# Patient Record
Sex: Female | Born: 1935
Health system: Southern US, Community
[De-identification: ages and names within clinical notes are randomized; demographics above are authoritative.]

## PROBLEM LIST (undated history)

## (undated) DIAGNOSIS — J310 Chronic rhinitis: Secondary | ICD-10-CM

## (undated) DIAGNOSIS — Z96659 Presence of unspecified artificial knee joint: Secondary | ICD-10-CM

## (undated) DIAGNOSIS — R519 Headache, unspecified: Secondary | ICD-10-CM

## (undated) DIAGNOSIS — J45909 Unspecified asthma, uncomplicated: Secondary | ICD-10-CM

## (undated) DIAGNOSIS — N3281 Overactive bladder: Secondary | ICD-10-CM

## (undated) DIAGNOSIS — I10 Essential (primary) hypertension: Secondary | ICD-10-CM

## (undated) DIAGNOSIS — Z9889 Other specified postprocedural states: Secondary | ICD-10-CM

## (undated) DIAGNOSIS — R112 Nausea with vomiting, unspecified: Secondary | ICD-10-CM

## (undated) DIAGNOSIS — I051 Rheumatic mitral insufficiency: Secondary | ICD-10-CM

## (undated) DIAGNOSIS — H409 Unspecified glaucoma: Secondary | ICD-10-CM

## (undated) DIAGNOSIS — T4145XA Adverse effect of unspecified anesthetic, initial encounter: Secondary | ICD-10-CM

## (undated) DIAGNOSIS — T8859XA Other complications of anesthesia, initial encounter: Secondary | ICD-10-CM

## (undated) DIAGNOSIS — R7303 Prediabetes: Secondary | ICD-10-CM

## (undated) DIAGNOSIS — K219 Gastro-esophageal reflux disease without esophagitis: Secondary | ICD-10-CM

## (undated) DIAGNOSIS — R51 Headache: Secondary | ICD-10-CM

## (undated) DIAGNOSIS — M1990 Unspecified osteoarthritis, unspecified site: Secondary | ICD-10-CM

## (undated) DIAGNOSIS — Z889 Allergy status to unspecified drugs, medicaments and biological substances status: Secondary | ICD-10-CM

## (undated) DIAGNOSIS — R9431 Abnormal electrocardiogram [ECG] [EKG]: Secondary | ICD-10-CM

## (undated) DIAGNOSIS — I739 Peripheral vascular disease, unspecified: Secondary | ICD-10-CM

## (undated) DIAGNOSIS — E785 Hyperlipidemia, unspecified: Secondary | ICD-10-CM

## (undated) DIAGNOSIS — E162 Hypoglycemia, unspecified: Secondary | ICD-10-CM

## (undated) DIAGNOSIS — Z8489 Family history of other specified conditions: Secondary | ICD-10-CM

## (undated) DIAGNOSIS — Z973 Presence of spectacles and contact lenses: Secondary | ICD-10-CM

## (undated) DIAGNOSIS — H9190 Unspecified hearing loss, unspecified ear: Secondary | ICD-10-CM

## (undated) DIAGNOSIS — F419 Anxiety disorder, unspecified: Secondary | ICD-10-CM

## (undated) HISTORY — PX: EYE SURGERY: SHX253

## (undated) HISTORY — PX: JOINT REPLACEMENT: SHX530

## (undated) HISTORY — PX: OTHER SURGICAL HISTORY: SHX169

## (undated) HISTORY — PX: APPENDECTOMY: SHX54

## (undated) HISTORY — DX: Rheumatic mitral insufficiency: I05.1

## (undated) HISTORY — DX: Hyperlipidemia, unspecified: E78.5

## (undated) HISTORY — DX: Overactive bladder: N32.81

## (undated) HISTORY — PX: DIAGNOSTIC LAPAROSCOPY: SUR761

## (undated) HISTORY — PX: BREAST SURGERY: SHX581

## (undated) HISTORY — PX: VEIN LIGATION: SHX2652

## (undated) HISTORY — PX: OVARIAN CYST SURGERY: SHX726

## (undated) HISTORY — PX: ABDOMINAL HYSTERECTOMY: SHX81

## (undated) HISTORY — PX: KNEE ARTHROSCOPY: SUR90

## (undated) HISTORY — PX: DILATION AND CURETTAGE OF UTERUS: SHX78

## (undated) HISTORY — PX: URETER SURGERY: SHX823

## (undated) HISTORY — PX: TUBAL LIGATION: SHX77

---

## 2004-09-26 ENCOUNTER — Ambulatory Visit: Payer: Self-pay | Admitting: Internal Medicine

## 2004-10-10 ENCOUNTER — Ambulatory Visit: Payer: Self-pay | Admitting: Internal Medicine

## 2004-10-19 ENCOUNTER — Ambulatory Visit: Payer: Self-pay | Admitting: Internal Medicine

## 2004-11-22 ENCOUNTER — Ambulatory Visit: Payer: Self-pay | Admitting: Internal Medicine

## 2005-01-24 ENCOUNTER — Ambulatory Visit: Payer: Self-pay | Admitting: Critical Care Medicine

## 2005-02-07 ENCOUNTER — Ambulatory Visit: Payer: Self-pay | Admitting: Internal Medicine

## 2005-04-15 ENCOUNTER — Ambulatory Visit: Payer: Self-pay | Admitting: Internal Medicine

## 2005-04-19 ENCOUNTER — Ambulatory Visit: Payer: Self-pay | Admitting: Cardiology

## 2005-05-01 ENCOUNTER — Ambulatory Visit: Payer: Self-pay | Admitting: Emergency Medicine

## 2005-06-04 ENCOUNTER — Ambulatory Visit: Payer: Self-pay | Admitting: Internal Medicine

## 2005-07-19 ENCOUNTER — Ambulatory Visit: Payer: Self-pay | Admitting: Internal Medicine

## 2005-10-01 ENCOUNTER — Ambulatory Visit: Payer: Self-pay | Admitting: Internal Medicine

## 2006-01-03 ENCOUNTER — Ambulatory Visit: Payer: Self-pay | Admitting: Internal Medicine

## 2006-06-18 ENCOUNTER — Ambulatory Visit: Payer: Self-pay | Admitting: Internal Medicine

## 2006-06-25 ENCOUNTER — Ambulatory Visit: Payer: Self-pay | Admitting: Internal Medicine

## 2006-07-22 HISTORY — PX: COLECTOMY: SHX59

## 2006-07-24 ENCOUNTER — Ambulatory Visit: Payer: Self-pay | Admitting: Internal Medicine

## 2006-10-23 ENCOUNTER — Ambulatory Visit: Payer: Self-pay | Admitting: Internal Medicine

## 2007-03-30 ENCOUNTER — Ambulatory Visit: Payer: Self-pay | Admitting: Internal Medicine

## 2007-07-23 HISTORY — PX: FUNCTIONAL ENDOSCOPIC SINUS SURGERY: SUR616

## 2007-08-12 ENCOUNTER — Encounter (INDEPENDENT_AMBULATORY_CARE_PROVIDER_SITE_OTHER): Payer: Self-pay | Admitting: Otolaryngology

## 2007-08-12 ENCOUNTER — Ambulatory Visit (HOSPITAL_COMMUNITY): Admission: RE | Admit: 2007-08-12 | Discharge: 2007-08-13 | Payer: Self-pay | Admitting: Otolaryngology

## 2007-11-16 ENCOUNTER — Encounter: Admission: RE | Admit: 2007-11-16 | Discharge: 2007-11-16 | Payer: Self-pay | Admitting: Otolaryngology

## 2008-08-17 ENCOUNTER — Telehealth (INDEPENDENT_AMBULATORY_CARE_PROVIDER_SITE_OTHER): Payer: Self-pay | Admitting: *Deleted

## 2010-06-18 ENCOUNTER — Encounter
Admission: RE | Admit: 2010-06-18 | Discharge: 2010-07-19 | Payer: Self-pay | Source: Home / Self Care | Attending: Orthopedic Surgery | Admitting: Orthopedic Surgery

## 2010-07-19 ENCOUNTER — Encounter
Admission: RE | Admit: 2010-07-19 | Discharge: 2010-08-21 | Payer: Self-pay | Source: Home / Self Care | Attending: Orthopedic Surgery | Admitting: Orthopedic Surgery

## 2010-08-23 ENCOUNTER — Ambulatory Visit: Payer: Commercial Managed Care - PPO | Admitting: Physical Therapy

## 2010-08-29 ENCOUNTER — Ambulatory Visit: Payer: Medicare Other | Attending: Orthopedic Surgery | Admitting: Physical Therapy

## 2010-08-29 DIAGNOSIS — M546 Pain in thoracic spine: Secondary | ICD-10-CM | POA: Insufficient documentation

## 2010-08-29 DIAGNOSIS — M25519 Pain in unspecified shoulder: Secondary | ICD-10-CM | POA: Insufficient documentation

## 2010-08-29 DIAGNOSIS — M2569 Stiffness of other specified joint, not elsewhere classified: Secondary | ICD-10-CM | POA: Insufficient documentation

## 2010-08-29 DIAGNOSIS — IMO0001 Reserved for inherently not codable concepts without codable children: Secondary | ICD-10-CM | POA: Insufficient documentation

## 2010-09-05 ENCOUNTER — Ambulatory Visit: Payer: Medicare Other | Admitting: Physical Therapy

## 2010-09-13 ENCOUNTER — Ambulatory Visit: Payer: Medicare Other | Admitting: Physical Therapy

## 2010-09-20 ENCOUNTER — Institutional Professional Consult (permissible substitution): Payer: Self-pay | Admitting: Internal Medicine

## 2010-11-12 ENCOUNTER — Ambulatory Visit: Payer: Medicare Other | Attending: Orthopedic Surgery | Admitting: Physical Therapy

## 2010-11-12 DIAGNOSIS — M25519 Pain in unspecified shoulder: Secondary | ICD-10-CM | POA: Insufficient documentation

## 2010-11-12 DIAGNOSIS — M546 Pain in thoracic spine: Secondary | ICD-10-CM | POA: Insufficient documentation

## 2010-11-12 DIAGNOSIS — IMO0001 Reserved for inherently not codable concepts without codable children: Secondary | ICD-10-CM | POA: Insufficient documentation

## 2010-11-12 DIAGNOSIS — M2569 Stiffness of other specified joint, not elsewhere classified: Secondary | ICD-10-CM | POA: Insufficient documentation

## 2010-11-14 ENCOUNTER — Ambulatory Visit: Payer: Medicare Other | Admitting: Physical Therapy

## 2010-11-19 ENCOUNTER — Ambulatory Visit: Payer: Medicare Other | Admitting: Physical Therapy

## 2010-11-23 ENCOUNTER — Ambulatory Visit: Payer: Medicare Other | Attending: Orthopedic Surgery | Admitting: Physical Therapy

## 2010-11-23 DIAGNOSIS — M25519 Pain in unspecified shoulder: Secondary | ICD-10-CM | POA: Insufficient documentation

## 2010-11-23 DIAGNOSIS — M2569 Stiffness of other specified joint, not elsewhere classified: Secondary | ICD-10-CM | POA: Insufficient documentation

## 2010-11-23 DIAGNOSIS — M546 Pain in thoracic spine: Secondary | ICD-10-CM | POA: Insufficient documentation

## 2010-11-23 DIAGNOSIS — IMO0001 Reserved for inherently not codable concepts without codable children: Secondary | ICD-10-CM | POA: Insufficient documentation

## 2010-11-28 ENCOUNTER — Ambulatory Visit: Payer: Medicare Other | Admitting: Physical Therapy

## 2010-11-30 ENCOUNTER — Ambulatory Visit: Payer: Medicare Other | Admitting: Physical Therapy

## 2010-12-04 NOTE — Op Note (Signed)
NAME:  Cassandra Sims, Cassandra Sims               ACCOUNT NO.:  [id number]   MEDICAL RECORD NO.:  [id number]          PATIENT TYPE:  AMB   LOCATION:  SDS                          FACILITY:  [facility]   PHYSICIAN:  Jefry H. Pollyann Kennedy, MD     DATE OF BIRTH:  1933-01-25   DATE OF PROCEDURE:  08/12/2007  DATE OF DISCHARGE:                               OPERATIVE REPORT   PREOPERATIVE DIAGNOSES:  1. Nasal polyposis.  2. Chronic ethmoid sinusitis.  3. Chronic maxillary sinusitis.  4. Chronic frontal sinusitis.   POSTOPERATIVE DIAGNOSES:  1. Nasal polyposis.  2. Chronic ethmoid sinusitis.  3. Chronic maxillary sinusitis.  4. Chronic frontal sinusitis.   PROCEDURE:  1. Bilateral endoscopic extensive nasal polypectomy.  2. Bilateral endoscopic total ethmoidectomy.  3. Bilateral endoscopic frontal sinusotomy.  4. Bilateral endoscopic maxillary antrostomy with removal of tissue.   SURGEON:  Jefry H. Pollyann Kennedy, MD   ANESTHESIA:  General endotracheal anesthesia was used.   COMPLICATIONS:  No complications.   BLOOD LOSS:  Less than 100 mL.   REFERRING PHYSICIAN:  Clinton D. Young, MD, FCCP, FACP   FINDINGS:  Diffuse polypoid disease arising from the middle meatus  bilaterally, with extensive nasal polyposis and sinus polyposis.  Bilateral maxillary and frontal sinuses were completely filled with very  thick tenacious mucoid material.   HISTORY:  This is a 77 year old lady with a long history of chronic  sinusitis and nasal obstruction.  Risks, benefits, alternatives,  complications of procedure explained to the patient's who seemed to  understand and agreed to surgery.   PROCEDURE:  The patient was taken to the operating room and placed on  the operating table in supine position.  Following induction of general  endotracheal anesthesia the face was prepped and draped in standard  fashion.  Afrin spray was used preoperatively.  1% Xylocaine with  epinephrine was infiltrated into the polypoid material and  the superior  and posterior attachments of the middle turbinates bilaterally.   1. Extensive bilateral nasal polypectomy.  Using a 0 degree nasal      endoscope and a microdebrider an extensive polypectomy was      performed in the nasal cavities bilaterally.  Polyps were followed      all the way back into the infundibular region and into the ethmoid      complex.  2. Bilateral endoscopic total ethmoidectomy.  After the polypectomy      was completed the dissection continued through the bulla and into      the ethmoid complex.  The ground lamella was taken down to expose      the posterior ethmoid cells.  The sinuses were either filled with      polypoid material, thick mucoid material or allergic fungal mucin      type material.  A sample of the allergic mucin was sent in a Lukens      trap for fungal smear and culture.  Complete ethmoid dissection was      performed bilaterally with the lateral limit of dissection being      the lamina papyracea and the  superior limit being the fovea      ethmoidalis.  Both of these structures were intact bilaterally      without dehiscence.  After the dissection was completed the ethmoid      cavities were packed with Kyung Rudd packs and inflated with local      anesthetic solution.  3. Bilateral endoscopic frontal sinusotomy.  After the ethmoid      dissection was completed attention was then carried over to the      frontal recess.  Using a 30 degree scope and curved suction the      frontal recess was entered and polypoid disease was cleaned out      with the forceps and microdebrider.  The sinus themselves were      suctioned out of very thick tenacious mucoid material using a      combination of thick and thin angled suctions.  The openings were      nice and wide following the polypoid dissection.  4. Bilateral endoscopic maxillary antrostomy with removal of tissue.      After the ethmoid bulla was opened the middle meatus was inspected       with a 30 degree scope and curved suction was entered through the      fontanelle into the maxillary sinuses bilaterally.  The anterior      edge of the antrostomy was enlarged using a backbiting forceps and      posteriorly using a through cut forceps.  On both sides there was      very thick mucinous type material contained within the sinuses and      polypoid diseased mucosa both of which were all removed.  At the      termination of the procedure the nasal and sinus cavities as well      as the pharynx were suctioned of all blood and debris and then the      patient was awakened, extubated and transferred to recovery in      stable condition.      Jefry H. Pollyann Kennedy, MD  Electronically Signed     JHR/MEDQ  D:  08/12/2007  T:  08/12/2007  Job:  [id number]   cc:   Joni Fears D. Maple Hudson, MD, FCCP, FACP

## 2010-12-04 NOTE — Assessment & Plan Note (Signed)
[facility]                             PULMONARY OFFICE NOTE   Sims, Cassandra                        MRN:          [id number]  DATE:03/30/2007                            DOB:          09-22-1932    HISTORY:  A 77 year old white female who is last seen in this office in  March 2007 with symptoms of predominant chronic rhinitis.  She has  already received maximum treatment for her chronic rhinitis in the form  of nasal steroids and frequent antibiotics and, therefore, was seen by  Dr. Pollyann Kennedy who recommended surgery, but she is just not sure she should  go that direction, based on what she has heard.   She comes in today with new onset of hemoptysis in the setting of  ongoing nasal congestion with intermittent epistaxis.  She also has  purulent nasal discharge.  She denies any pleuritic pain, orthopnea,  PND, or leg swelling.   Medication reviewed in detail on the face sheet date March 30, 2007.  Note that the patient previously had a medication calendar that  delineated maintenance versus p.r.n. with the p.r.n. specifically listed  as to symptoms.  Today she was very confused about how to use p.r.n. and  made up her own sheet that did not include this information and did not  bring the sheet that she was previously given here which included all  this information in an unambiguous and very user friendly fashion.   PHYSICAL EXAMINATION:  GENERAL:  She is a depressed-appearing, 77-year-  old white female in no acute distress.  VITAL SIGNS:  Stable vital signs.  HEENT:  Moderate turbinate edema with no active bleeding.  Oropharynx is  clear with no evidence of excessive post nasal drainage, bloody,  purulent or otherwise.  CHEST:  She had a few inspiratory and expiatory rhonchi, but overall air  movement was adequate.  HEART:  Regular rhythm without murmur, gallop or rub.  ABDOMEN:  Soft, benign.  EXTREMITIES:  Warm without calf tenderness,  cyanosis or clubbing or  edema.   IMPRESSION:  1. Chronic rhinitis, chronic sinusitis refractory to medical therapy      and, therefore, I strongly favor following Dr. Lucky Rathke plan to      treat her more aggressively surgically.  I spent extra time going      over my reasoning behind this and gave her, as I did in March of      2007, both graphic and text-formatted material on the optimal      management of chronic rhinitis.  To treat her acutely today, I      recommended Levaquin 750 mg for five days.  If she continues to      have hemoptysis in the absence of any epistaxis, then that would      indicate further pulmonary work-up is necessary, but I do not think      that this is going to be the case at all and emphasized to her      that, although epistaxis did not cause hemoptysis, hemoptysis never  causes epistaxis especially unlikely in this never smoker with no      history of bronchiectasis.  However, should the problem continue,      we can certainly see her back here for followup but would feel that      she would be a more appropriately treated at ENT by Dr. Pollyann Kennedy, and      if that includes surgery, then I strongly recommend she proceed      with it.  2. In terms of her p.r.n. medications, I then went over each and every      one of the p.r.n. medications we have her on to explain under what      circumstances she could take which medicine.  Specifically if she      has cough or congestion, she should use Mucinex DM.  If she has      wheezing or shortness of breath, she should use albuterol.  For      stuffy congested nose, with obstructive symptoms, she could use      Advil Cold and sinus.  I also offered the service of Tammy, our      nurse practitioner, to regenerate the medication calendar if she      will bring in all of her medicines with her in two separate bags,      maintenance versus p.r.n.     Cassandra Sims. Cassandra Sires, MD, [facility]  Electronically Signed    MBW/MedQ   DD: 03/30/2007  DT: 03/31/2007  Job #: [id number]   cc:   Doreatha Martin, M.D.  Jefry H. Pollyann Kennedy, MD

## 2010-12-07 NOTE — Assessment & Plan Note (Signed)
[facility]                             PULMONARY OFFICE NOTE   Cassandra Sims, Cassandra Sims                        MRN:          [id number]  DATE:04/24/2007                            DOB:          July 08, 1933    PROBLEMS:  1. Allergic rhinitis.  2. Chronic rhinosinusitis.  3. Asthma.   HISTORY:  One month followup. Complains of nasal congestion, occasional  sneeze, stuffy nose interferes with her sleep. Chest feels fine. Nasal  symptoms vary with the weather. Last time here, we had given her Entex  PSE and 10 days of Biaxin 500 mg b.i.d. She took the Entex just in the  mornings, but still said that it kept her awake at night and over  stimulated. It may have cleared nasal stuffiness temporarily. She has  not had headache or purulent discharge. We reviewed her limited sinus CT  from the [facility] machine on March 30, 2005, which described extensive  chronic paranasal nasal disease with some bony remodeling particularly  of the ethmoid complex/lamina papyracea suggesting chronicity and  questioning of polyps with no air-fluid levels. This had been discussed  with her previously. I suggested ENT evaluation. She had had positive  allergy skin tests when last here and we reviewed options of allergy  management along with a reinforcement of environmental precautions. She  is interested in considering allergy vaccine depending on how she does  with ENT evaluation.   MEDICATIONS:  1. Advair HFA 45/21 two puffs b.i.d.  2. Azopt optic drops b.i.d.  3. Norvasc 5 mg times one-half.  4. Micardis 40 mg.  5. Actonel 35 mg per week.  6. Multivitamins.  7. Tums.  8. Lumigan.  9. Astelin nasal spray, use p.r.n.  10.Albuterol rescue inhaler.  11.Over-the-counter decongestant and mucolytic therapies.   ALLERGIES:  No medication allergy.   OBJECTIVE:  Weight 122 pounds. BP 132/80. Pulse is regular at 70. Room  air saturation 98%.  There is white mucus in the nose.  I do not think I am seeing polyps or  mechanical obstruction.  Tympanic membranes are not bulging. There is no erythema.  Chest is quiet and unlabored.  Heart sounds regular without murmur.  There is no adenopathy.   IMPRESSION:  1. Allergic rhinitis with a chronic rhinosinusitis.  2. Mild controlled asthma.   PLAN:  1. ENT referral to Dr. Allegra Grana on January 29th to evaluate options      for chronic sinusitis and some history of eustachian dysfunction.  2. Schedule return in 3 months, which will be spring weather. Consider      option of allergy vaccine at that time.  3. She is instructed in saline nasal lavage.     Clinton D. Maple Hudson, MD, [facility], FACP  Electronically Signed    CDY/MedQ  DD: 07/24/2006  DT: 07/24/2006  Job #: [id number]   cc:   Doreatha Martin, M.D.  Jefry H. Pollyann Kennedy, MD

## 2011-04-11 LAB — FUNGUS CULTURE W SMEAR: Fungal Smear: NONE SEEN

## 2011-04-11 LAB — CBC
HCT: 40.3
Hemoglobin: 13.6
MCV: 92.1
Platelets: 223
RDW: 13.7

## 2011-04-11 LAB — BASIC METABOLIC PANEL
Calcium: 9.6
Creatinine, Ser: 0.65
GFR calc non Af Amer: >60

## 2011-06-10 DIAGNOSIS — H35371 Puckering of macula, right eye: Secondary | ICD-10-CM | POA: Insufficient documentation

## 2012-02-26 DIAGNOSIS — H2510 Age-related nuclear cataract, unspecified eye: Secondary | ICD-10-CM | POA: Insufficient documentation

## 2012-08-04 ENCOUNTER — Other Ambulatory Visit: Payer: Self-pay | Admitting: Internal Medicine

## 2012-08-04 ENCOUNTER — Ambulatory Visit (INDEPENDENT_AMBULATORY_CARE_PROVIDER_SITE_OTHER): Payer: Medicare Other | Admitting: Internal Medicine

## 2012-08-04 DIAGNOSIS — R0602 Shortness of breath: Secondary | ICD-10-CM

## 2012-08-04 DIAGNOSIS — R0600 Dyspnea, unspecified: Secondary | ICD-10-CM

## 2012-08-04 LAB — PULMONARY FUNCTION TEST

## 2012-08-04 NOTE — Progress Notes (Signed)
PFT done today. 

## 2012-08-18 ENCOUNTER — Other Ambulatory Visit: Payer: Self-pay | Admitting: Internal Medicine

## 2012-10-20 ENCOUNTER — Encounter (HOSPITAL_BASED_OUTPATIENT_CLINIC_OR_DEPARTMENT_OTHER): Payer: Self-pay | Admitting: *Deleted

## 2012-10-20 NOTE — Progress Notes (Signed)
To come in for bmet-ekg Had had this surgery in past

## 2012-10-22 ENCOUNTER — Encounter (HOSPITAL_BASED_OUTPATIENT_CLINIC_OR_DEPARTMENT_OTHER): Payer: Self-pay | Admitting: *Deleted

## 2012-10-22 ENCOUNTER — Encounter (HOSPITAL_BASED_OUTPATIENT_CLINIC_OR_DEPARTMENT_OTHER)
Admission: RE | Admit: 2012-10-22 | Discharge: 2012-10-22 | Disposition: A | Discharge status: Home / Self Care | Payer: Medicare Other | Source: Ambulatory Visit | Attending: Otolaryngology | Admitting: Otolaryngology

## 2012-10-25 NOTE — H&P (Signed)
Assessment   Rhinitis, chronic (472.0) (J31.0). Discussed  She only took 2 days of Levaquin because she had some side effects, and he feels drunk. She feels a little better now but still feels very full and does not have any further discharge from her sinuses when she rinses with saline. Exam, the ears were clear and healthy today. Middle ear looked clear and healthy as well. Nasal exam looks much improved there is no mucosal edema and no exudate seen.   Recommend she try to finish the last 2 days of Levaquin having her take a half at a time to see if she has any residual side effects. She has a hard time taking most of the antibiotics. And she will let me know if this doesn't help. Reason For Visit  Recheck ears. Allergies  BandAids Clindamycin Codeine Derivatives Darvocet-N 100 TABS Dilaudid SOLN; Hypotension Doxycycline Hyclate CAPS Flagyl TABS Lactose Macrodantin CAPS Morphine Derivatives; Hallucinations Nitrofurantoin CAPS Penicillins; Shortness of breath,Rash,Swelling * swelling tongue, 17 Jan 2012 Premarin CREA Sulfa Drugs; Rash Tape Zofran SOLN; Hypotension. Current Meds  Centrum Oral Tablet;TAKE 0.5 TABLET 3 TIMES A WEEK; RPT Micardis 40 MG Oral Tablet;TAKE 1 TABLET ONCE DAILY.; Rx Vitamin D-3 TABS;; RPT Ventolin HFA 108 (90 Base) MCG/ACT Inhalation Aerosol Solution;INHALE 1-2 PUFFS EVERY 4-6 HOURS AS NEEDED AND AS DIRECTED.; Rx Vitamin D TABS;; RPT Systane SOLN;; RPT Mupirocin 2 % External Ointment;APPLY TO NOSE 3 TIMES A DAY FOR 7 DAYS; RPT Advair HFA 115-21 MCG/ACT Inhalation Aerosol;INHALE 2 PUFFS AT 12 HOUR INTERVALS (MORNING AND EVENING).; Rx Lutein TABS;; RPT AmLODIPine Besylate 10 MG Oral Tablet;Take 1 tablet every day; Rx Caltrate 600+D TABS;; RPT ZyrTEC Allergy TABS;; RPT Omega 3 1000 MG Oral Capsule;TAKE  CAPSULE TWICE DAILY; RPT Qnasl 80 MCG/ACT Nasal Aerosol Solution;Use 2 sprays each nostril once daily; Rx. Active Problems  Allergic rhinitis  (477.9)  (J30.9) Asthma  (493.90) (J45.909) Benign essential hypertension  (401.1) (I10) Cataract Both Eyes  Chronic obstructive pulmonary disease  (496) (J44.9) Chronic sinusitis  (473.9) (J32.9) CONDUCT HEARING LOSS NOS  (389.00) Cystocele, lateral  (618.02) (N81.12) Dermatophytosis, nail  (110.1) (B35.1) Diaphragmatic paralysis  (519.4) (J98.6); left side Disorder of bursae and tendons in shoulder region, unspecified laterality  (726.10) (M75.50) Dysuria  (788.1) (R30.0) Esophageal reflux  (530.81) (K21.9) Eustachian tube dysfunction  (381.81) (H69.80) Flat foot  (734) (M21.40) Glaucoma of both eyes  (365.9) (H40.9) Hyperlipemia  (272.4) (E78.5) Intolerance To Milk Products  Localized Primary Osteoarthritis Of The Right Shoulder Acromioclavicular Joint  MIXED HEARING LOSS, UNSP  (389.20) Mixed incontinence  (788.33) (N39.46) Osteoporosis, senile  (733.01) (M81.8) Prediabetes  (790.29) (R73.09) RHINITIS DUE TO POLLEN  (477.0) Rhinitis, chronic  (472.0) (J31.0) Shoulder joint pain, unspecified laterality  (719.41) (M25.519) Varicose veins  (454.9) (I86.8). PMH  Acute sinusitis (461.9) (J01.90) History of acute bronchitis Resolving (V12.69) (Z87.09) Pain in thoracic spine (724.1) (M54.6); Resolved: [id number] Urological Procedures; s/p transected left ureter at time of oophrectomy - repaired initially with repeat repair at later date. PSH  Anterior Colporrhaphy, Repair Of Cystocele 05 Feb 2006; --HPRO po days 90--Diagnosis Stress female--Cystocele, midline Appendectomy; age 45 Biopsy Breast Percutaneous Needle Core; L breast Jan 2006 florid epithelial hyperplasia Cataract Surgery Cesarean Section Cystoscopy (Diagnostic) 05 Feb 2006; --HPRO po days 90--Diagnosis Stress female--Cystocele, midline Enterotomy Baker Tube Placement 09 Sep 2008; LOA, segmental resec of jejunum, tube decompressionassisted by Hal Hope PAHPRIP Exploratory Laparotomy; Excision ovarian tumor Hysterectomy;  due to lots of blood clots, at age 56 Sinus Surgery Tubal Ligation  Vaginal Sling Operation For Stress Incontinence 05 Feb 2006; --HPRO po days 90--Diagnosis Stress female--Cystocele, midline Varicose Vein Ligation; bilateral. Family Hx  Family history of allergies (V19.6) (Z84.89) Family history of hypertension (V17.49) (Z83.49) Family history of lung cancer: Brother (V16.1) (Z80.1); twin brothers both were smokers and died of lung cancers in their 70's Family history of osteoporosis (V17.81) (Z82.62). Personal Hx  Denied Alcohol Use (History) Exercise Habits Former Smoker Marital History - Currently Married Marital History - Single; divorced.  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 Signature  Electronically signed by : Serena Colonel  M.D.; 07/24/2012 2:18 PM EST.

## 2012-10-26 ENCOUNTER — Encounter (HOSPITAL_BASED_OUTPATIENT_CLINIC_OR_DEPARTMENT_OTHER): Payer: Self-pay | Admitting: *Deleted

## 2012-10-26 ENCOUNTER — Ambulatory Visit (HOSPITAL_BASED_OUTPATIENT_CLINIC_OR_DEPARTMENT_OTHER)
Admission: RE | Admit: 2012-10-26 | Discharge: 2012-10-26 | Disposition: A | Discharge status: Home / Self Care | Payer: Medicare Other | Source: Ambulatory Visit | Attending: Otolaryngology | Admitting: Otolaryngology

## 2012-10-26 ENCOUNTER — Encounter (HOSPITAL_BASED_OUTPATIENT_CLINIC_OR_DEPARTMENT_OTHER)
Admission: RE | Disposition: A | Discharge status: Home / Self Care | Payer: Self-pay | Source: Ambulatory Visit | Attending: Otolaryngology

## 2012-10-26 ENCOUNTER — Ambulatory Visit (HOSPITAL_BASED_OUTPATIENT_CLINIC_OR_DEPARTMENT_OTHER): Payer: Medicare Other | Admitting: *Deleted

## 2012-10-26 DIAGNOSIS — J4489 Other specified chronic obstructive pulmonary disease: Secondary | ICD-10-CM | POA: Insufficient documentation

## 2012-10-26 DIAGNOSIS — J329 Chronic sinusitis, unspecified: Secondary | ICD-10-CM | POA: Insufficient documentation

## 2012-10-26 DIAGNOSIS — Z87891 Personal history of nicotine dependence: Secondary | ICD-10-CM | POA: Insufficient documentation

## 2012-10-26 DIAGNOSIS — J449 Chronic obstructive pulmonary disease, unspecified: Secondary | ICD-10-CM | POA: Insufficient documentation

## 2012-10-26 DIAGNOSIS — Z9109 Other allergy status, other than to drugs and biological substances: Secondary | ICD-10-CM | POA: Insufficient documentation

## 2012-10-26 DIAGNOSIS — Z881 Allergy status to other antibiotic agents status: Secondary | ICD-10-CM | POA: Insufficient documentation

## 2012-10-26 DIAGNOSIS — J309 Allergic rhinitis, unspecified: Secondary | ICD-10-CM | POA: Insufficient documentation

## 2012-10-26 DIAGNOSIS — K219 Gastro-esophageal reflux disease without esophagitis: Secondary | ICD-10-CM | POA: Insufficient documentation

## 2012-10-26 DIAGNOSIS — Z79899 Other long term (current) drug therapy: Secondary | ICD-10-CM | POA: Insufficient documentation

## 2012-10-26 DIAGNOSIS — Z882 Allergy status to sulfonamides status: Secondary | ICD-10-CM | POA: Insufficient documentation

## 2012-10-26 DIAGNOSIS — I10 Essential (primary) hypertension: Secondary | ICD-10-CM | POA: Insufficient documentation

## 2012-10-26 DIAGNOSIS — I8390 Asymptomatic varicose veins of unspecified lower extremity: Secondary | ICD-10-CM | POA: Insufficient documentation

## 2012-10-26 DIAGNOSIS — Z885 Allergy status to narcotic agent status: Secondary | ICD-10-CM | POA: Insufficient documentation

## 2012-10-26 DIAGNOSIS — Z880 Allergy status to penicillin: Secondary | ICD-10-CM | POA: Insufficient documentation

## 2012-10-26 HISTORY — DX: Gastro-esophageal reflux disease without esophagitis: K21.9

## 2012-10-26 HISTORY — DX: Essential (primary) hypertension: I10

## 2012-10-26 HISTORY — DX: Unspecified osteoarthritis, unspecified site: M19.90

## 2012-10-26 HISTORY — DX: Chronic rhinitis: J31.0

## 2012-10-26 HISTORY — DX: Nausea with vomiting, unspecified: R11.2

## 2012-10-26 HISTORY — DX: Allergy status to unspecified drugs, medicaments and biological substances: Z88.9

## 2012-10-26 HISTORY — DX: Other complications of anesthesia, initial encounter: T88.59XA

## 2012-10-26 HISTORY — DX: Unspecified asthma, uncomplicated: J45.909

## 2012-10-26 HISTORY — PX: NASAL SINUS SURGERY: SHX719

## 2012-10-26 HISTORY — DX: Unspecified glaucoma: H40.9

## 2012-10-26 HISTORY — DX: Adverse effect of unspecified anesthetic, initial encounter: T41.45XA

## 2012-10-26 HISTORY — DX: Presence of spectacles and contact lenses: Z97.3

## 2012-10-26 HISTORY — DX: Peripheral vascular disease, unspecified: I73.9

## 2012-10-26 HISTORY — DX: Other specified postprocedural states: Z98.890

## 2012-10-26 HISTORY — DX: Unspecified hearing loss, unspecified ear: H91.90

## 2012-10-26 LAB — POCT HEMOGLOBIN-HEMACUE: Hemoglobin: 13.6 g/dL (ref 12.0–15.0)

## 2012-10-26 SURGERY — SINUS SURGERY, ENDOSCOPIC
Anesthesia: General | Site: Nose | Laterality: Bilateral | Wound class: Clean Contaminated

## 2012-10-26 MED ORDER — EPHEDRINE SULFATE 50 MG/ML IJ SOLN
INTRAMUSCULAR | Status: DC | PRN
  Administered 2012-10-26: 10 mg via INTRAVENOUS
  Administered 2012-10-26: 5 mg via INTRAVENOUS

## 2012-10-26 MED ORDER — FENTANYL CITRATE 0.05 MG/ML IJ SOLN
INTRAMUSCULAR | Status: DC | PRN
  Administered 2012-10-26: 50 ug via INTRAVENOUS
  Administered 2012-10-26: 25 ug via INTRAVENOUS

## 2012-10-26 MED ORDER — FENTANYL CITRATE 0.05 MG/ML IJ SOLN: 25.0000 ug | INTRAMUSCULAR | Status: DC | PRN

## 2012-10-26 MED ORDER — FENTANYL CITRATE 0.05 MG/ML IJ SOLN: 50.0000 ug | INTRAMUSCULAR | Status: DC | PRN

## 2012-10-26 MED ORDER — LEVOFLOXACIN 500 MG PO TABS: 500.0000 mg | ORAL_TABLET | Freq: Every day | ORAL | Status: DC

## 2012-10-26 MED ORDER — MIDAZOLAM HCL 2 MG/2ML IJ SOLN: 1.0000 mg | INTRAMUSCULAR | Status: DC | PRN

## 2012-10-26 MED ORDER — LIDOCAINE HCL (CARDIAC) 20 MG/ML IV SOLN
INTRAVENOUS | Status: DC | PRN
  Administered 2012-10-26: 60 mg via INTRAVENOUS

## 2012-10-26 MED ORDER — SUCCINYLCHOLINE CHLORIDE 20 MG/ML IJ SOLN
INTRAMUSCULAR | Status: DC | PRN
  Administered 2012-10-26: 80 mg via INTRAVENOUS

## 2012-10-26 MED ORDER — LIDOCAINE HCL 4 % MT SOLN
OROMUCOSAL | Status: DC | PRN
  Administered 2012-10-26: 2 mL via TOPICAL

## 2012-10-26 MED ORDER — LIDOCAINE-EPINEPHRINE 1 %-1:100000 IJ SOLN
INTRAMUSCULAR | Status: DC | PRN
  Administered 2012-10-26: 6 mL

## 2012-10-26 MED ORDER — OXYMETAZOLINE HCL 0.05 % NA SOLN
NASAL | Status: DC | PRN
  Administered 2012-10-26: 1 via NASAL

## 2012-10-26 MED ORDER — OXYMETAZOLINE HCL 0.05 % NA SOLN
2.0000 | NASAL | Status: AC
  Administered 2012-10-26 (×3): 2 via NASAL

## 2012-10-26 MED ORDER — LACTATED RINGERS IV SOLN
INTRAVENOUS | Status: DC
  Administered 2012-10-26 (×2): via INTRAVENOUS

## 2012-10-26 MED ORDER — PROPOFOL 10 MG/ML IV BOLUS
INTRAVENOUS | Status: DC | PRN
  Administered 2012-10-26: 100 mg via INTRAVENOUS

## 2012-10-26 SURGICAL SUPPLY — 46 items
ATTRACTOMAT 16X20 MAGNETIC DRP (DRAPES) ×2 IMPLANT
BLADE RAD40 ROTATE 4M 4 5PK (BLADE) IMPLANT
BLADE RAD60 ROTATE M4 4 5PK (BLADE) IMPLANT
BLADE ROTATE RAD12 5PK M4 4MM (BLADE) IMPLANT
BLADE TRICUT ROTATE M4 4 5PK (BLADE) IMPLANT
BUR HS RAD FRONTAL 3 (BURR) IMPLANT
CANISTER SUC SOCK COL 7 IN (MISCELLANEOUS) IMPLANT
CANISTER SUCTION 1200CC (MISCELLANEOUS) ×4 IMPLANT
CATH SINUS BALLN 7X16 (CATHETERS) IMPLANT
CATH SINUS BALLN RELIEV 6X16 (SINUPLASTY) IMPLANT
CATH SINUS GUIDE F-70 (CATHETERS) IMPLANT
CATH SINUS GUIDE M/110 (CATHETERS) IMPLANT
CATH SINUS IRRIGATION 2.0 (CATHETERS) IMPLANT
CLOTH BEACON ORANGE TIMEOUT ST (SAFETY) ×2 IMPLANT
CORDS BIPOLAR (ELECTRODE) IMPLANT
DECANTER SPIKE VIAL GLASS SM (MISCELLANEOUS) IMPLANT
DEVICE INFLATION 20/61 (MISCELLANEOUS) IMPLANT
DRESSING ADAPTIC 1/2  N-ADH (PACKING) IMPLANT
DRESSING NASAL KENNEDY 3.5X.9 (MISCELLANEOUS) IMPLANT
DRSG NASAL KENNEDY 3.5X.9 (MISCELLANEOUS)
DRSG NASAL KENNEDY LMNT 8CM (GAUZE/BANDAGES/DRESSINGS) IMPLANT
DRSG NASOPORE 8CM (GAUZE/BANDAGES/DRESSINGS) IMPLANT
DRSG TELFA 3X8 NADH (GAUZE/BANDAGES/DRESSINGS) IMPLANT
GAUZE SPONGE 4X4 16PLY XRAY LF (GAUZE/BANDAGES/DRESSINGS) IMPLANT
GAUZE VASELINE FOILPK 1/2 X 72 (GAUZE/BANDAGES/DRESSINGS) IMPLANT
GLOVE ECLIPSE 7.5 STRL STRAW (GLOVE) ×2 IMPLANT
GOWN PREVENTION PLUS XLARGE (GOWN DISPOSABLE) ×4 IMPLANT
HANDLE SINUS GUIDE LP (INSTRUMENTS) IMPLANT
HEMOSTAT SURGICEL .5X2 ABSORB (HEMOSTASIS) IMPLANT
IV NS 500ML (IV SOLUTION) ×1
IV NS 500ML BAXH (IV SOLUTION) ×1 IMPLANT
NEEDLE 27GAX1X1/2 (NEEDLE) ×2 IMPLANT
NEEDLE SPNL 25GX3.5 QUINCKE BL (NEEDLE) IMPLANT
NS IRRIG 1000ML POUR BTL (IV SOLUTION) ×2 IMPLANT
PACK BASIN DAY SURGERY FS (CUSTOM PROCEDURE TRAY) ×2 IMPLANT
PACK ENT DAY SURGERY (CUSTOM PROCEDURE TRAY) ×2 IMPLANT
PATTIES SURGICAL .5 X3 (DISPOSABLE) ×2 IMPLANT
SLEEVE SCD COMPRESS KNEE MED (MISCELLANEOUS) ×2 IMPLANT
SOLUTION BUTLER CLEAR DIP (MISCELLANEOUS) ×2 IMPLANT
SPONGE GAUZE 2X2 8PLY STRL LF (GAUZE/BANDAGES/DRESSINGS) ×2 IMPLANT
SPONGE SURGIFOAM ABS GEL 12-7 (HEMOSTASIS) IMPLANT
SYSTEM RELIEVA LUMA ILLUM (SINUPLASTY) IMPLANT
TOWEL OR 17X24 6PK STRL BLUE (TOWEL DISPOSABLE) ×2 IMPLANT
TRAP SPECIMEN MUCOUS 40CC (MISCELLANEOUS) ×2 IMPLANT
TUBE CONNECTING 20X1/4 (TUBING) ×2 IMPLANT
YANKAUER SUCT BULB TIP NO VENT (SUCTIONS) ×2 IMPLANT

## 2012-10-26 NOTE — Interval H&P Note (Signed)
History and Physical Interval Note:  10/26/2012 7:24 AM  Cassandra Sims  has presented today for surgery, with the diagnosis of CHRONIC SINUSITIS   The various methods of treatment have been discussed with the patient and family. After consideration of risks, benefits and other options for treatment, the patient has consented to  Procedure(s): BILATERAL ENDOSCOPIC REVISION ETHMOID MAXILLARY AND FRONTAL SINUS SURGERY   (Bilateral) as a surgical intervention .  The patient's history has been reviewed, patient examined, no change in status, stable for surgery.  I have reviewed the patient's chart and labs.  Questions were answered to the patient's satisfaction.     Jaylissa Felty

## 2012-10-26 NOTE — Op Note (Signed)
OPERATIVE REPORT  DATE OF SURGERY: 10/26/2012  PATIENT:  Cassandra Sims,  79 y.o. female  PRE-OPERATIVE DIAGNOSIS:  CHRONIC SINUSITIS   POST-OPERATIVE DIAGNOSIS:  CHRONIC SINUSITIS   PROCEDURE:  Procedure(s): BILATERAL ENDOSCOPIC REVISION ETHMOID MAXILLARY AND FRONTAL SINUS SURGERY    SURGEON:  Susy Frizzle, MD   ASSISTANTS: none  ANESTHESIA:   General   EBL:  25 ml  DRAINS: None   LOCAL MEDICATIONS USED:  1% Xylocaine with epinephrine  SPECIMEN:  Bilateral sinus contents, left maxillary sinus contents for culture and sensitivity  COUNTS:  Correct  PROCEDURE DETAILS: The patient was taken to the operating room and placed on the operating table in the supine position. Following induction of general endotracheal anesthesia, the face was draped in a standard fashion. A 0 and 30 nasal endoscopes were used. Local anesthetic was infiltrated into the superior and posterior attachments of the middle turbinates bilaterally. Lateral nasal wall was also infiltrated.  Revision bilateral endoscopic ethmoidectomy. Using a 30 sinus scope, the ethmoid complex wound was inspected. There were multiple fibrotic partitions present there were taken down using the microdebrider. There was the upper part of the uncinate process bilaterally that was also completely removed. The lamina papyracea was intact bilaterally. The cavities were otherwise clean and healthy. Both maxillary sinuses were widely patent, but filled with purulent material. This was suctioned out and a sample was collected and sent for culture and sensitivity testing. There is no polypoid disease present on either side.  Bilateral revision endoscopic frontal sinusotomy. After the ethmoidectomy revision was completed, the attention was then turned to the frontal recess. There was polypoid disease bilaterally after frontal recess. This was taken down using giraffe forceps. Following that, the frontal sinuses were easily entered using a  curved suction.  On both sides the maxillary sinuses were irrigated with saline using a 60 cc syringe, forcefully, and then all of this was suctioned out. Afrin pledgets were placed bilaterally which were then removed after extubation. She was then awakened, extubated and transferred to recovery in stable condition    PATIENT DISPOSITION:  To PACU, stable

## 2012-10-26 NOTE — Anesthesia Preprocedure Evaluation (Addendum)
Anesthesia Evaluation  Patient identified by MRN, date of birth, ID band Patient awake    Reviewed: Allergy & Precautions, H&P , NPO status , Patient's Chart, lab work & pertinent test results  History of Anesthesia Complications (+) PONV  Airway Mallampati: II TM Distance: >3 FB Neck ROM: Full    Dental no notable dental hx. (+) Teeth Intact and Dental Advisory Given   Pulmonary asthma ,  breath sounds clear to auscultation  Pulmonary exam normal       Cardiovascular hypertension, On Medications + Peripheral Vascular Disease Rhythm:Regular Rate:Normal     Neuro/Psych negative neurological ROS  negative psych ROS   GI/Hepatic Neg liver ROS, GERD-  Medicated,  Endo/Other  negative endocrine ROS  Renal/GU negative Renal ROS  negative genitourinary   Musculoskeletal   Abdominal   Peds  Hematology negative hematology ROS (+)   Anesthesia Other Findings   Reproductive/Obstetrics negative OB ROS                           Anesthesia Physical Anesthesia Plan  ASA: II  Anesthesia Plan: General   Post-op Pain Management:    Induction: Intravenous  Airway Management Planned: Oral ETT  Additional Equipment:   Intra-op Plan:   Post-operative Plan: Extubation in OR  Informed Consent: I have reviewed the patients History and Physical, chart, labs and discussed the procedure including the risks, benefits and alternatives for the proposed anesthesia with the patient or authorized representative who has indicated his/her understanding and acceptance.   Dental advisory given  Plan Discussed with: CRNA  Anesthesia Plan Comments:         Anesthesia Quick Evaluation

## 2012-10-26 NOTE — Transfer of Care (Signed)
Immediate Anesthesia Transfer of Care Note  Patient: Cassandra Sims  Procedure(s) Performed: Procedure(s): BILATERAL ENDOSCOPIC REVISION ETHMOID MAXILLARY AND FRONTAL SINUS SURGERY   (Bilateral)  Patient Location: PACU  Anesthesia Type:General  Level of Consciousness: awake, alert  and oriented  Airway & Oxygen Therapy: Patient Spontanous Breathing and Patient connected to face mask oxygen  Post-op Assessment: Report given to PACU RN, Post -op Vital signs reviewed and stable and Patient moving all extremities  Post vital signs: Reviewed and stable  Complications: No apparent anesthesia complications

## 2012-10-26 NOTE — Anesthesia Procedure Notes (Signed)
Procedure Name: Intubation Date/Time: 10/26/2012 7:40 AM Performed by: Meyer Russel Pre-anesthesia Checklist: Patient identified, Emergency Drugs available, Suction available and Patient being monitored Patient Re-evaluated:Patient Re-evaluated prior to inductionOxygen Delivery Method: Circle System Utilized Preoxygenation: Pre-oxygenation with 100% oxygen Intubation Type: IV induction Ventilation: Mask ventilation without difficulty Laryngoscope Size: Miller and 2 Grade View: Grade II Tube type: Oral Tube size: 7.0 mm Number of attempts: 1 Airway Equipment and Method: stylet and LTA kit utilized Placement Confirmation: ETT inserted through vocal cords under direct vision,  positive ETCO2 and breath sounds checked- equal and bilateral Secured at: 22 cm Tube secured with: Tape Dental Injury: Teeth and Oropharynx as per pre-operative assessment

## 2012-10-26 NOTE — Anesthesia Postprocedure Evaluation (Signed)
  Anesthesia Post-op Note  Patient: Cassandra Sims  Procedure(s) Performed: Procedure(s): BILATERAL ENDOSCOPIC REVISION ETHMOID MAXILLARY AND FRONTAL SINUS SURGERY   (Bilateral)  Patient Location: PACU  Anesthesia Type:General  Level of Consciousness: awake, alert  and oriented  Airway and Oxygen Therapy: Patient Spontanous Breathing  Post-op Pain: none  Post-op Assessment: Post-op Vital signs reviewed, Patient's Cardiovascular Status Stable, Respiratory Function Stable, Patent Airway and No signs of Nausea or vomiting  Post-op Vital Signs: Reviewed and stable  Complications: No apparent anesthesia complications

## 2012-10-27 ENCOUNTER — Encounter (HOSPITAL_BASED_OUTPATIENT_CLINIC_OR_DEPARTMENT_OTHER): Payer: Self-pay | Admitting: Otolaryngology

## 2012-10-28 LAB — CULTURE, ROUTINE-SINUS

## 2012-11-12 ENCOUNTER — Ambulatory Visit: Admit: 2012-11-12 | Payer: Self-pay | Admitting: Otolaryngology

## 2012-11-12 SURGERY — SINUS SURGERY, ENDOSCOPIC
Anesthesia: General | Laterality: Bilateral

## 2012-11-23 LAB — FUNGUS CULTURE W SMEAR: Fungal Smear: NONE SEEN

## 2012-11-24 ENCOUNTER — Ambulatory Visit (INDEPENDENT_AMBULATORY_CARE_PROVIDER_SITE_OTHER)
Admission: RE | Admit: 2012-11-24 | Discharge: 2012-11-24 | Disposition: A | Discharge status: Home / Self Care | Payer: Medicare Other | Source: Ambulatory Visit | Attending: Internal Medicine | Admitting: Internal Medicine

## 2012-11-24 ENCOUNTER — Encounter: Payer: Self-pay | Admitting: Internal Medicine

## 2012-11-24 ENCOUNTER — Ambulatory Visit (INDEPENDENT_AMBULATORY_CARE_PROVIDER_SITE_OTHER): Payer: Medicare Other | Admitting: Internal Medicine

## 2012-11-24 VITALS — BP 130/80 | HR 71 | Temp 98.3°F | Ht 62.5 in | Wt 118.0 lb

## 2012-11-24 DIAGNOSIS — J45909 Unspecified asthma, uncomplicated: Secondary | ICD-10-CM

## 2012-11-24 DIAGNOSIS — R059 Cough, unspecified: Secondary | ICD-10-CM | POA: Insufficient documentation

## 2012-11-24 DIAGNOSIS — R05 Cough: Secondary | ICD-10-CM | POA: Insufficient documentation

## 2012-11-24 DIAGNOSIS — J329 Chronic sinusitis, unspecified: Secondary | ICD-10-CM

## 2012-11-24 MED ORDER — MOMETASONE FURO-FORMOTEROL FUM 100-5 MCG/ACT IN AERO: INHALATION_SPRAY | RESPIRATORY_TRACT | Status: DC

## 2012-11-24 NOTE — Progress Notes (Signed)
  Subjective:    Patient ID: Cassandra Sims, female    DOB: 03/23/1933   MRN: [id number]  HPI   60 yowf never smoker with chronic rhinitis/sinusitis and recurrent cough and wheeze rx with advair chronically self referred 11/24/2012 to pulmonary clinic for eval of "chest congestion"   11/24/2012 1st pulmonary ov in EMR era cc daily congested cough x "maybe a decade" with minimal yellow mucus better on levaquin  and sob  while maintained  on advair bid with doe x  5-10 min of housework and walking aisles at grocery store  worsex sev months indolent onset, minimally progressive  and ? Better overall since last since last sinus surgery October 26 2012.    No obvious daytime variabilty or assoc chronic cough or cp or chest tightness, subjective wheeze overt sinus or hb symptoms. No unusual exp hx or h/o childhood pna/ asthma or premature birth to her knowledge.   Sleeping ok without nocturnal  or early am exacerbation  of respiratory  c/o's or need for noct saba. Also denies any obvious fluctuation of symptoms with weather or environmental changes or other aggravating or alleviating factors except as outlined above      Review of Systems  Constitutional: Positive for appetite change. Negative for fever and unexpected weight change.  HENT: Positive for congestion, sore throat and sneezing. Negative for ear pain, nosebleeds, rhinorrhea, trouble swallowing, dental problem, postnasal drip and sinus pressure.   Eyes: Negative for redness and itching.  Respiratory: Positive for cough and shortness of breath. Negative for chest tightness and wheezing.   Cardiovascular: Negative for palpitations and leg swelling.  Gastrointestinal: Negative for nausea and vomiting.  Genitourinary: Negative for dysuria.  Musculoskeletal: Positive for joint swelling.  Skin: Negative for rash.  Neurological: Negative for headaches.  Hematological: Does not bruise/bleed easily.  Psychiatric/Behavioral: Negative for dysphoric  mood. The patient is not nervous/anxious.        Objective:   Physical Exam  Elderly amb wf nad Wt Readings from Last 3 Encounters:  11/24/12 118 lb (53.524 kg)  10/26/12 115 lb 4 oz (52.277 kg)  10/26/12 115 lb 4 oz (52.277 kg)  HEENT: nl dentition, turbinates, and orophanx. Nl external ear canals without cough reflex   NECK :  without JVD/Nodes/TM/ nl carotid upstrokes bilaterally   LUNGS: no acc muscle use,min exp rhonchi bilaterally without cough on insp or exp maneuvers   CV:  RRR  no s3 or murmur or increase in P2, no edema   ABD:  soft and nontender with nl excursion in the supine position. No bruits or organomegaly, bowel sounds nl  MS:  warm without deformities, calf tenderness, cyanosis or clubbing  SKIN: warm and dry without lesions    NEURO:  alert, approp, no deficits   CXR  11/24/2012 :  Underlying emphysema with evidence of prior granulomatous disease and areas of bibasilar scarring. No edema or consolidation. There is stable persistent elevation of the left hemidiaphragm.       Assessment & Plan:

## 2012-11-24 NOTE — Patient Instructions (Addendum)
dulera 100 Take 2 puffs first thing in am and then another 2 puffs about 12 hours later.   For cough try mucinex 600 mg 2 every 12 hours as needed  Work on inhaler technique:  relax and gently blow all the way out then take a nice smooth deep breath back in, triggering the inhaler at same time you start breathing in.  Hold for up to 5 seconds if you can.  Rinse and gargle with water when done   If your mouth or throat starts to bother you,   I suggest you time the inhaler to your dental care and after using the inhaler(s) brush teeth and tongue with a baking soda containing toothpaste and when you rinse this out, gargle with it first to see if this helps your mouth and throat.    GERD (REFLUX)  is an extremely common cause of respiratory symptoms, many times with no significant heartburn at all.    It can be treated with medication, but also with lifestyle changes including avoidance of late meals, excessive alcohol, smoking cessation, and avoid fatty foods, chocolate, peppermint, colas, red wine, and acidic juices such as orange juice.  NO MINT OR MENTHOL PRODUCTS SO NO COUGH DROPS  USE SUGARLESS CANDY INSTEAD (jolley ranchers or Stover's)  NO OIL BASED VITAMINS - use powdered substitutes.    See Tammy NP w/in 2 weeks with all your medications, even over the counter meds, separated in two separate bags, the ones you take no matter what vs the ones you stop once you feel better and take only as needed when you feel you need them.   Tammy  will generate for you a new user friendly medication calendar that will put [place] all on the same page re: your medication use.     Without this process, it simply isn't possible to assure that we are providing  your outpatient care  with  the attention to detail we feel you deserve.   If we cannot assure that you're getting that kind of care,  then we cannot manage your problem effectively from this clinic.  Once you have seen Tammy and we are sure that we're all  on the same page with your medication use she will arrange follow up with me.  Late add ppi and hs pepcid next ov if still coughing

## 2012-11-25 DIAGNOSIS — J329 Chronic sinusitis, unspecified: Secondary | ICD-10-CM | POA: Insufficient documentation

## 2012-11-25 DIAGNOSIS — J45991 Cough variant asthma: Secondary | ICD-10-CM | POA: Insufficient documentation

## 2012-11-25 NOTE — Assessment & Plan Note (Signed)
The most common causes of chronic cough in immunocompetent adults include the following: upper airway cough syndrome (UACS), previously referred to as postnasal drip syndrome (PNDS), which is caused by variety of rhinosinus conditions; (2) asthma; (3) GERD; (4) chronic bronchitis from cigarette smoking or other inhaled environmental irritants; (5) nonasthmatic eosinophilic bronchitis; and (6) bronchiectasis.   These conditions, singly or in combination, have accounted for up to 94% of the causes of chronic cough in prospective studies.   Other conditions have constituted no >6% of the causes in prospective studies These have included bronchogenic carcinoma, chronic interstitial pneumonia, sarcoidosis, left ventricular failure, ACEI-induced cough, and aspiration from a condition associated with pharyngeal dysfunction.    Chronic cough is often simultaneously caused by more than one condition. A single cause has been found from 38 to 82% of the time, multiple causes from 18 to 62%. Multiply caused cough has been the result of three diseases up to 42% of the time.      Most likely this is asthmatic bronchitis vs  Classic Upper airway cough syndrome, so named because it's frequently impossible to sort out how much is  CR/sinusitis with freq throat clearing (which can be related to primary GERD)   vs  causing  secondary (" extra esophageal")  GERD from wide swings in gastric pressure that occur with throat clearing, often  promoting self use of mint and menthol lozenges that reduce the lower esophageal sphincter tone and exacerbate the problem further in a cyclical fashion.   These are the same pts (now being labeled as having "irritable larynx syndrome" by some cough centers) who not infrequently have a history of having failed to tolerate ace inhibitors (prob case here since on arb for hbp) ,  dry powder inhalers or biphosphonates or report having atypical reflux symptoms that don't respond to standard doses  of PPI , and are easily confused as having aecopd or asthma flares by even experienced allergists/ pulmonologists.   Will therefore d/c advair, try on dulera 100 2bid then return for med reconciliation and go from there

## 2012-11-25 NOTE — Progress Notes (Signed)
Quick Note:  Spoke with pt and notified of results per Dr. Wert. Pt verbalized understanding and denied any questions.  ______ 

## 2012-11-25 NOTE — Assessment & Plan Note (Signed)
--   sinus surgery 10/26/12   Defer rx to Pollyann Kennedy

## 2012-11-25 NOTE — Assessment & Plan Note (Signed)
DDX of  difficult airways managment all start with A and  include Adherence, Ace Inhibitors, Acid Reflux, Active Sinus Disease, Alpha 1 Antitripsin deficiency, Anxiety masquerading as Airways dz,  ABPA,  allergy(esp in young), Aspiration (esp in elderly), Adverse effects of DPI,  Active smokers, plus two Bs  = Bronchiectasis and Beta blocker use..and one C= CHF  Adherence is always the initial "prime suspect" and is a multilayered concern that requires a "trust but verify" approach in every patient - starting with knowing how to use medications, especially inhalers, correctly, keeping up with refills and understanding the fundamental difference between maintenance and prns vs those medications only taken for a very short course and then stopped and not refilled. The proper method of use, as well as anticipated side effects, of a metered-dose inhaler are discussed and demonstrated to the patient. Improved effectiveness after extensive coaching during this visit to a level of approximately  75% so try dulera 100 2bid  ? Acid and non acid reflux > d/c fish oil, reviewed diet  ? Bronchiectasis > ct chest may need to be considered in few of sinus dz severity.  Pt has struggled in past with concept of medication recociliation.  To keep things simple, I have asked the patient to first separate medicines that are perceived as maintenance, that is to be taken daily "no matter what", from those medicines that are taken on only on an as-needed basis and I have given the patient examples of both, and then return to see our NP to generate a  detailed  medication calendar which should be followed until the next physician sees the patient and updates it.

## 2012-11-30 ENCOUNTER — Telehealth: Payer: Self-pay | Admitting: Internal Medicine

## 2012-11-30 NOTE — Telephone Encounter (Signed)
Called home # line busy Called cell # and lmtcb x1

## 2012-12-02 NOTE — Telephone Encounter (Signed)
Pt is aware of recs. Nothing further was needed

## 2012-12-02 NOTE — Telephone Encounter (Signed)
Called, spoke with pt.  States she isn't coughing mcuh but when she does she gets up some dark red blood mixed in with mucus.  Reports this was going on "occasionally" prior to Consult OV with MW on 11/24/12 but has now increased to approx once daily.  Does have some wheezing.  Denies increased SOB, chest tightness, chest pain, or f/c/s.  She has an OV scheduled with TP on 12/10/12 for a med calender. I offered OV today with MW as this has increased since OV with MW but pt feels she can wait until the May 22 visit to be seen.  Dr. Sherene Sires, pls advise if you have any further recs.  Thank you.

## 2012-12-02 NOTE — Telephone Encounter (Signed)
Unless gets a lot worse would just stick with the original plan

## 2012-12-10 ENCOUNTER — Encounter: Payer: Self-pay | Admitting: Adult Health

## 2012-12-10 ENCOUNTER — Ambulatory Visit (INDEPENDENT_AMBULATORY_CARE_PROVIDER_SITE_OTHER): Payer: Medicare Other | Admitting: Adult Health

## 2012-12-10 VITALS — BP 106/60 | HR 68 | Temp 98.3°F | Ht 62.5 in | Wt 115.2 lb

## 2012-12-10 DIAGNOSIS — J45909 Unspecified asthma, uncomplicated: Secondary | ICD-10-CM

## 2012-12-10 NOTE — Assessment & Plan Note (Addendum)
?   Asthma w/ hx of chronic sinus dz and nasal polyps  Will treat for triggers ? GERD .  Consider CT chest on return as ? Bronchiectasis component w/ blood tinged mucus on/off and chronic sinus dz  , left hemidiaphragm paresis (complicated ABD surgery) .   Patient's medications were reviewed today and patient education was given. Computerized medication calendar was adjusted/completed  Plan  Add Prilosec 20mg  daily before meal  Add Pepcid 20mg  At bedtime   May use Delsym 2 tsp Twice daily  As needed  Cough  Follow med calendar closely and bring to each visit.  Please contact office for sooner follow up if symptoms do not improve or worsen or seek emergency care

## 2012-12-10 NOTE — Progress Notes (Signed)
Subjective:    Patient ID: Cassandra Sims, female    DOB: 1933-03-10   MRN: [id number] HPI 45 yowf never smoker with chronic rhinitis/sinusitis and recurrent cough and wheeze rx with advair chronically self referred 11/24/2012 to pulmonary clinic for eval of "chest congestion" S/p sinus surgery #2  10/2012 -Dr. Pollyann Kennedy  Hx of nasal polyps  Hx Complicated abd surgery w/ subsequent left hemidiaphragm paresis /elevation   11/24/2012 1st pulmonary ov in EMR era cc daily congested cough x "maybe a decade" with minimal yellow mucus better on levaquin  and sob  while maintained  on advair bid with doe x  5-10 min of housework and walking aisles at grocery store  worsex sev months indolent onset, minimally progressive  and ? Better overall since last since last sinus surgery October 26 2012. >>rx Elwin Sleight   12/10/2012 Follow up /Med calendar   Pt returns for a 2 week follow up and med calendar.  We reviewed all her meds and organized them into a med calendar  It appears she is taking correctly.  Previous PFT 07/2012 w/ nml FEV1  -no change w/ BD, ratio 68 Seen last ov with new consult for persistent cough .  She has had a slow to resolve cough over last several months. Has been going on for years but worse recently.  Seen >5 yr ago for cough/chronic sinus dz.  Had sinus surgery 10/2012 -Dr. Tish Frederickson on [place] last ov , has some improvement .  Cough does have tinge of blood -dark pink/brown from time to time.  No frank hemoptysis. No chest pain , dyspnea or calf pain/edema.  CXR last ov showed chronically elevated left hemidiaphragm  Bibasilar scarring.     Review of Systems  Constitutional:   No  weight loss, night sweats,  Fevers, chills, fatigue, or  lassitude.  HEENT:   No headaches,  Difficulty swallowing,  Tooth/dental problems, or  Sore throat,                No sneezing, itching, ear ache, nasal congestion, post nasal drip,   CV:  No chest pain,  Orthopnea, PND, swelling in lower extremities,  anasarca, dizziness, palpitations, syncope.   GI  No heartburn, indigestion, abdominal pain, nausea, vomiting, diarrhea, change in bowel habits, loss of appetite, bloody stools.   Resp:    No change in color of mucus.  No wheezing.  No chest wall deformity  Skin: no rash or lesions.  GU: no dysuria, change in color of urine, no urgency or frequency.  No flank pain, no hematuria   MS:  No joint pain or swelling.  No decreased range of motion.  No back pain.  Psych:  No change in mood or affect. No depression or anxiety.  No memory loss.          Objective:   Physical Exam  Elderly amb wf nad  HEENT: nl dentition, turbinates, and orophanx. Nl external ear canals without cough reflex  NECK :  without JVD/Nodes/TM/ nl carotid upstrokes bilaterally   LUNGS: no acc muscle use,min exp rhonchi bilaterally without cough on insp or exp maneuvers   CV:  RRR  no s3 or murmur or increase in P2, no edema   ABD:  soft and nontender with nl excursion in the supine position. No bruits or organomegaly, bowel sounds nl  MS:  warm without deformities, calf tenderness, cyanosis or clubbing  SKIN: warm and dry without lesions    NEURO:  alert, approp,  no deficits   CXR  11/24/2012 : Underlying emphysema with evidence of prior granulomatous disease and areas of bibasilar scarring. No edema or consolidation. There is stable persistent elevation of the left hemidiaphragm.       Assessment & Plan:

## 2012-12-10 NOTE — Addendum Note (Signed)
Addended by: Boone Master E on: 12/10/2012 03:50 PM   Modules accepted: Orders

## 2012-12-10 NOTE — Patient Instructions (Addendum)
Add Prilosec 20mg  daily before meal  Add Pepcid 20mg  At bedtime   May use Delsym 2 tsp Twice daily  As needed  Cough  Follow med calendar closely and bring to each visit.  Please contact office for sooner follow up if symptoms do not improve or worsen or seek emergency care  follow up Dr. Sherene Sires  In 6 weeks and As needed

## 2012-12-22 ENCOUNTER — Ambulatory Visit: Payer: Medicare Other | Attending: Orthopaedic Surgery | Admitting: Physical Therapy

## 2012-12-22 DIAGNOSIS — M545 Low back pain, unspecified: Secondary | ICD-10-CM | POA: Insufficient documentation

## 2012-12-22 DIAGNOSIS — IMO0001 Reserved for inherently not codable concepts without codable children: Secondary | ICD-10-CM | POA: Insufficient documentation

## 2012-12-22 DIAGNOSIS — M25559 Pain in unspecified hip: Secondary | ICD-10-CM | POA: Insufficient documentation

## 2012-12-23 ENCOUNTER — Telehealth: Payer: Self-pay | Admitting: Adult Health

## 2012-12-23 MED ORDER — MOMETASONE FURO-FORMOTEROL FUM 100-5 MCG/ACT IN AERO: INHALATION_SPRAY | RESPIRATORY_TRACT | Status: DC

## 2012-12-23 NOTE — Telephone Encounter (Signed)
I spoke with pt and is aware RX has been sent. Nothing further was needed.  

## 2012-12-25 ENCOUNTER — Telehealth: Payer: Self-pay | Admitting: Adult Health

## 2012-12-25 NOTE — Telephone Encounter (Signed)
Yes should stay on meds until cough is 100% gone off all cough suppression for at least a couple of weeks before considering off acid suppresion or we won't be able to sort this all out when returns

## 2012-12-25 NOTE — Telephone Encounter (Signed)
Spoke with patient made her aware of MW recs as listed below Patient verbalized understanding and nothing further needed at this time

## 2012-12-25 NOTE — Telephone Encounter (Signed)
Spoke with pt She states that her cough has almost resolved She states that she had to r/s ov with TP for next month Wants to know if she needs to continue taking prilosec or pepcid Please advise thanks

## 2012-12-30 ENCOUNTER — Ambulatory Visit: Payer: Medicare Other | Admitting: Physical Therapy

## 2013-01-06 ENCOUNTER — Ambulatory Visit: Payer: Medicare Other | Admitting: Physical Therapy

## 2013-01-13 ENCOUNTER — Ambulatory Visit: Payer: Medicare Other | Admitting: Physical Therapy

## 2013-01-20 ENCOUNTER — Ambulatory Visit: Payer: Medicare Other | Attending: Orthopaedic Surgery | Admitting: Physical Therapy

## 2013-01-20 DIAGNOSIS — M545 Low back pain, unspecified: Secondary | ICD-10-CM | POA: Insufficient documentation

## 2013-01-20 DIAGNOSIS — IMO0001 Reserved for inherently not codable concepts without codable children: Secondary | ICD-10-CM | POA: Insufficient documentation

## 2013-01-20 DIAGNOSIS — M25559 Pain in unspecified hip: Secondary | ICD-10-CM | POA: Insufficient documentation

## 2013-01-21 ENCOUNTER — Ambulatory Visit: Payer: Medicare Other | Admitting: Adult Health

## 2013-01-26 ENCOUNTER — Ambulatory Visit: Payer: Medicare Other | Admitting: Physical Therapy

## 2013-01-27 ENCOUNTER — Encounter: Payer: Self-pay | Admitting: Adult Health

## 2013-01-27 ENCOUNTER — Ambulatory Visit (INDEPENDENT_AMBULATORY_CARE_PROVIDER_SITE_OTHER): Payer: Medicare Other | Admitting: Adult Health

## 2013-01-27 VITALS — BP 116/60 | HR 70 | Temp 97.9°F | Ht 62.5 in | Wt 114.8 lb

## 2013-01-27 DIAGNOSIS — R05 Cough: Secondary | ICD-10-CM

## 2013-01-27 DIAGNOSIS — J45909 Unspecified asthma, uncomplicated: Secondary | ICD-10-CM

## 2013-01-27 DIAGNOSIS — R059 Cough, unspecified: Secondary | ICD-10-CM

## 2013-01-27 NOTE — Assessment & Plan Note (Signed)
Improved control  Cont on [facility]

## 2013-01-27 NOTE — Patient Instructions (Addendum)
Continue  Prilosec 20mg  daily before meal  Continue  Pepcid 20mg  At bedtime   May use Delsym 2 tsp Twice daily  As needed  Cough  Change Zyrtec to Allergra 180mg  daily in am .  May use ChlorTrimeton 4mg   2 tabs At bedtime  As needed  Drainage.  Follow med calendar closely and bring to each visit.  Please contact office for sooner follow up if symptoms do not improve or worsen or seek emergency care  Follow up  6 -8 weeks and As needed

## 2013-01-27 NOTE — Assessment & Plan Note (Signed)
Improving with treatment aimed at GERD/Cough/Rhinitis prevention  Plan Continue  Prilosec 20mg  daily before meal  Continue  Pepcid 20mg  At bedtime   May use Delsym 2 tsp Twice daily  As needed  Cough  Change Zyrtec to Allergra 180mg  daily in am .  May use ChlorTrimeton 4mg   2 tabs At bedtime  As needed  Drainage.  Follow med calendar closely and bring to each visit.  Please contact office for sooner follow up if symptoms do not improve or worsen or seek emergency care  Follow up  6 -8 weeks and As needed

## 2013-01-27 NOTE — Progress Notes (Signed)
Subjective:    Patient ID: Cassandra Sims, female    DOB: [birth year]   MRN: [id number] HPI 72 yowf never smoker with chronic rhinitis/sinusitis and recurrent cough and wheeze rx with advair chronically self referred 11/24/2012 to pulmonary clinic for eval of "chest congestion" S/p sinus surgery #2  10/2012 -Dr. Pollyann Kennedy  Hx of nasal polyps  Hx Complicated abd surgery w/ subsequent left hemidiaphragm paresis /elevation   11/24/2012 1st pulmonary ov in EMR era cc daily congested cough x "maybe a decade" with minimal yellow mucus better on levaquin  and sob  while maintained  on advair bid with doe x  5-10 min of housework and walking aisles at grocery store  worsex sev months indolent onset, minimally progressive  and ? Better overall since last since last sinus surgery October 26 2012. >>rx Elwin Sleight   12/10/2012 Follow up /Med calendar   Pt returns for a 2 week follow up and med calendar.  We reviewed all her meds and organized them into a med calendar  It appears she is taking correctly.  Previous PFT 07/2012 w/ nml FEV1  -no change w/ BD, ratio 68 Seen last ov with new consult for persistent cough .  She has had a slow to resolve cough over last several months. Has been going on for years but worse recently.  Seen >5 yr ago for cough/chronic sinus dz.  Had sinus surgery 10/2012 -Dr. Tish Frederickson on [place] last ov , has some improvement .  Cough does have tinge of blood -dark pink/brown from time to time.  No frank hemoptysis. No chest pain , dyspnea or calf pain/edema.  CXR last ov showed chronically elevated left hemidiaphragm  Bibasilar scarring.  >>added ppi/pepcid /med cal  01/27/2013 Follow up  6 week follow up cough .  Reports she feels the cough is some improved since last ov, reports some chest congestion - no mucus production. No wheezing or dyspnea.  Dry cough is better. Still has drippy nose and drainage.  No hemoptysis, chest pain, edema, orthopnea.    Review of Systems   Constitutional:   No  weight loss, night sweats,  Fevers, chills, fatigue, or  lassitude.  HEENT:   No headaches,  Difficulty swallowing,  Tooth/dental problems, or  Sore throat,                No sneezing, itching, ear ache,  +nasal congestion, post nasal drip,   CV:  No chest pain,  Orthopnea, PND, swelling in lower extremities, anasarca, dizziness, palpitations, syncope.   GI  No heartburn, indigestion, abdominal pain, nausea, vomiting, diarrhea, change in bowel habits, loss of appetite, bloody stools.   Resp:    No change in color of mucus.  No wheezing.  No chest wall deformity  Skin: no rash or lesions.  GU: no dysuria, change in color of urine, no urgency or frequency.  No flank pain, no hematuria   MS:  No joint pain or swelling.  No decreased range of motion.  No back pain.  Psych:  No change in mood or affect. No depression or anxiety.  No memory loss.          Objective:   Physical Exam  Elderly amb wf nad  HEENT: nl dentition, turbinates, and orophanx. Nl external ear canals without cough reflex  NECK :  without JVD/Nodes/TM/ nl carotid upstrokes bilaterally   LUNGS: no acc muscle use,min exp rhonchi bilaterally without cough on insp or exp maneuvers   CV:  RRR  no s3 or murmur or increase in P2, no edema   ABD:  soft and nontender with nl excursion in the supine position. No bruits or organomegaly, bowel sounds nl  MS:  warm without deformities, calf tenderness, cyanosis or clubbing  SKIN: warm and dry without lesions    NEURO:  alert, approp, no deficits   CXR  11/24/2012 : Underlying emphysema with evidence of prior granulomatous disease and areas of bibasilar scarring. No edema or consolidation. There is stable persistent elevation of the left hemidiaphragm.       Assessment & Plan:

## 2013-01-29 NOTE — Addendum Note (Signed)
Addended by: Boone Master E on: 01/29/2013 10:42 AM   Modules accepted: Orders

## 2013-02-02 ENCOUNTER — Ambulatory Visit: Payer: Medicare Other | Admitting: Physical Therapy

## 2013-03-09 ENCOUNTER — Ambulatory Visit (INDEPENDENT_AMBULATORY_CARE_PROVIDER_SITE_OTHER): Payer: Medicare Other | Admitting: Adult Health

## 2013-03-09 ENCOUNTER — Encounter: Payer: Self-pay | Admitting: Adult Health

## 2013-03-09 VITALS — BP 104/64 | HR 64 | Temp 98.4°F | Ht 62.5 in | Wt 115.2 lb

## 2013-03-09 DIAGNOSIS — J45909 Unspecified asthma, uncomplicated: Secondary | ICD-10-CM

## 2013-03-09 NOTE — Patient Instructions (Addendum)
Continue on Allergra 180mg  daily in am .  May use ChlorTrimeton 4mg   2 tabs At bedtime  As needed  Drainage.   Please contact office for sooner follow up if symptoms do not improve or worsen or seek emergency care  Follow up  3 months with Dr. Sherene Sires  And As needed

## 2013-03-09 NOTE — Progress Notes (Signed)
Subjective:    Patient ID: Cassandra Sims, female    DOB: [birth year]   MRN: [id number] HPI 69 yowf never smoker with chronic rhinitis/sinusitis and recurrent cough and wheeze rx with advair chronically self referred 11/24/2012 to pulmonary clinic for eval of "chest congestion" S/p sinus surgery #2  10/2012 -Dr. Pollyann Kennedy  Hx of nasal polyps  Hx Complicated abd surgery w/ subsequent left hemidiaphragm paresis /elevation   11/24/2012 1st pulmonary ov in EMR era cc daily congested cough x "maybe a decade" with minimal yellow mucus better on levaquin  and sob  while maintained  on advair bid with doe x  5-10 min of housework and walking aisles at grocery store  worsex sev months indolent onset, minimally progressive  and ? Better overall since last since last sinus surgery October 26 2012. >>rx Elwin Sleight   12/10/2012 Follow up /Med calendar   Pt returns for a 2 week follow up and med calendar.  We reviewed all her meds and organized them into a med calendar  It appears she is taking correctly.  Previous PFT 07/2012 w/ nml FEV1  -no change w/ BD, ratio 68 Seen last ov with new consult for persistent cough .  She has had a slow to resolve cough over last several months. Has been going on for years but worse recently.  Seen >5 yr ago for cough/chronic sinus dz.  Had sinus surgery 10/2012 -Dr. Tish Frederickson on [place] last ov , has some improvement .  Cough does have tinge of blood -dark pink/brown from time to time.  No frank hemoptysis. No chest pain , dyspnea or calf pain/edema.  CXR last ov showed chronically elevated left hemidiaphragm  Bibasilar scarring.  >>added ppi/pepcid /med cal  01/27/2013 Follow up  6 week follow up cough .  Reports she feels the cough is some improved since last ov, reports some chest congestion - no mucus production. No wheezing or dyspnea.  Dry cough is better. Still has drippy nose and drainage.  No hemoptysis, chest pain, edema, orthopnea.  >>change allergra and add  chlortrimeton At bedtime    03/09/2013 Follow up  8 week follow up cough - reports cough is improved since last ov. does have some draingae and sneezing Seen last ov with allegra and chlortrimeton added.  No fever, discolored mucus, edema or n/v.   Review of Systems  Constitutional:   No  weight loss, night sweats,  Fevers, chills, fatigue, or  lassitude.  HEENT:   No headaches,  Difficulty swallowing,  Tooth/dental problems, or  Sore throat,                No sneezing, itching, ear ache,  +nasal congestion, post nasal drip,   CV:  No chest pain,  Orthopnea, PND, swelling in lower extremities, anasarca, dizziness, palpitations, syncope.   GI  No heartburn, indigestion, abdominal pain, nausea, vomiting, diarrhea, change in bowel habits, loss of appetite, bloody stools.   Resp:    No change in color of mucus.  No wheezing.  No chest wall deformity  Skin: no rash or lesions.  GU: no dysuria, change in color of urine, no urgency or frequency.  No flank pain, no hematuria   MS:  No joint pain or swelling.  No decreased range of motion.  No back pain.  Psych:  No change in mood or affect. No depression or anxiety.  No memory loss.          Objective:   Physical Exam  Elderly amb wf nad  HEENT: nl dentition, turbinates, and orophanx. Nl external ear canals without cough reflex  NECK :  without JVD/Nodes/TM/ nl carotid upstrokes bilaterally   LUNGS: no acc muscle use,min diminished bs in bases, no wheezes ,  without cough on insp or exp maneuvers   CV:  RRR  no s3 or murmur or increase in P2, no edema   ABD:  soft and nontender with nl excursion in the supine position. No bruits or organomegaly, bowel sounds nl  MS:  warm without deformities, calf tenderness, cyanosis or clubbing  SKIN: warm and dry without lesions    NEURO:  alert, approp, no deficits   CXR  11/24/2012 : Underlying emphysema with evidence of prior granulomatous disease and areas of bibasilar scarring.  No edema or consolidation. There is stable persistent elevation of the left hemidiaphragm.       Assessment & Plan:

## 2013-03-09 NOTE — Assessment & Plan Note (Signed)
Compensated on present regimen Improved on tx aimed at asthma control, AR/GERD prevention   Plan  follow up in 3 months and As needed  With Dr. Sherene Sires

## 2013-03-24 ENCOUNTER — Ambulatory Visit: Payer: Medicare Other | Admitting: Adult Health

## 2013-03-25 ENCOUNTER — Telehealth: Payer: Self-pay | Admitting: Internal Medicine

## 2013-03-25 NOTE — Telephone Encounter (Signed)
Noted and will sign off message 

## 2013-03-30 ENCOUNTER — Telehealth: Payer: Self-pay | Admitting: Internal Medicine

## 2013-03-30 NOTE — Telephone Encounter (Signed)
Forms in MW lookat to be signed and then I will fill the rest out

## 2013-04-01 MED ORDER — MOMETASONE FURO-FORMOTEROL FUM 100-5 MCG/ACT IN AERO: INHALATION_SPRAY | RESPIRATORY_TRACT | Status: DC

## 2013-04-05 NOTE — Telephone Encounter (Signed)
Can this msg be closed?

## 2013-04-07 NOTE — Telephone Encounter (Signed)
Yes. Forms where mailed

## 2013-04-09 ENCOUNTER — Telehealth: Payer: Self-pay | Admitting: Internal Medicine

## 2013-04-09 NOTE — Telephone Encounter (Signed)
Per last phone note Verlon Au mailed the application. I called [employer] and they have not received it yet. Pt is aware. Carron Curie, CMA

## 2013-04-26 ENCOUNTER — Telehealth: Payer: Self-pay | Admitting: Internal Medicine

## 2013-04-26 DIAGNOSIS — H401190 Primary open-angle glaucoma, unspecified eye, stage unspecified: Secondary | ICD-10-CM | POA: Insufficient documentation

## 2013-04-26 NOTE — Telephone Encounter (Signed)
I spoke with pt. She stated she called the # and stated they told her they are awaiting her forms from [place]. I advised her I spoke with leslie and we have not received anything and when I spoke with [employer] they were mailing this to her home address. Pt stated they never received her form back. I advised that's correct bc when they mailed it to her they never got anything back. She will call them to have everything resent to her.

## 2013-04-26 NOTE — Telephone Encounter (Signed)
I called [employer] to see if form has been received yet. I was advised they did receive application on 04/09/13. I was advised they sent appeal form for pt since she does have health insurance and was sent to her home 04/10/13. They are waiting for this to be signed and mailed back to them to further assist pt. Also pt did not put dates on the application so they need the dates sent back as well with acception form. If pt did not receive this then she needs to give them a call at [phone].  I spoke with pt and she did not receive this and she will give them a call. Nothing further needed

## 2013-05-04 ENCOUNTER — Encounter: Payer: Self-pay | Admitting: Internal Medicine

## 2013-05-05 ENCOUNTER — Telehealth: Payer: Self-pay | Admitting: Internal Medicine

## 2013-05-05 NOTE — Telephone Encounter (Signed)
Spoke with patient in lobby She brought a patient assistance form with her that just needed MW's office information, med information and signature - pt asked that it be faxed from this office since [employer] has had trouble receiving the prior form Form filled out, signed by MW.  No fax number on form -- called the assistance number on the envelope @ [phone] and spoke with rep Dannielle Huh who reported that patient assistance form [facility] CANNOT be faxed, it must be mailed Informed patient of this and she requests we mail from the office Copy of forms made and original placed in the mail Nothing further needed at this time; will sign off.

## 2013-05-27 ENCOUNTER — Telehealth: Payer: Self-pay | Admitting: Internal Medicine

## 2013-05-27 NOTE — Telephone Encounter (Signed)
Duplicate forms were in pt chart. I have printed them out and had MW sign on 2nd page where it was missing sig. I have mailed this back to [facility]. Pt is aware. Nothing further needed

## 2013-05-27 NOTE — Telephone Encounter (Signed)
I spoke with pt. She reports [employer] was sending her forms back to [place] on 05/18/13 bc page 2 was missing information. Pt is checking on this. I advised pt will call [employer] to see what is going on.  I spoke with [employer]. Was advised they mailed [place] back pt original application on 05/13/13. Page 2 of the form needed to be filled out with RX information. They do not accept seprate RX printed out. I confirmed the address they sent this back to and it is our correct address. D/T pt was denied but they did send an appeal letter with the original application that we need to fill out. They do not accept anything by fax any longer. Please advise Verlon Au if you have this? thanks

## 2013-05-27 NOTE — Telephone Encounter (Signed)
Have not received anything yet.

## 2013-06-25 ENCOUNTER — Telehealth: Payer: Self-pay | Admitting: Internal Medicine

## 2013-06-25 DIAGNOSIS — J45909 Unspecified asthma, uncomplicated: Secondary | ICD-10-CM

## 2013-06-25 NOTE — Telephone Encounter (Signed)
Pt returned call & asks to be reached at [phone] .  Cassandra Sims

## 2013-06-25 NOTE — Telephone Encounter (Signed)
Called and spoke with pt and she stated that Dr. Lottie Dawson has requested recs from [facility] about changing the pts glaucoma meds.  She stated that Dr. Lottie Dawson has sent this over 2 times to [facility].  Please advise.   2nd issue is that she was seen by primary doctor today and he has changed her allegra and chlor-trimeton  And she was prescribed the certrazine.  Pt stated that she will hold the 2 meds and just take the certrizine x 1 month to see if this helps her.  She wanted to check with MW to make sure this will be ok before she changes meds.  She is aware that MW back in the office on Monday.  Pt ok with this.   Allergies  Allergen Reactions  . Penicillins Shortness Of Breath  . Adhesive [Tape] Itching  . Clindamycin/Lincomycin Itching  . Codeine Nausea And Vomiting  . Darvocet [Propoxyphene-Acetaminophen]     HTN  . Dilaudid [Hydromorphone Hcl] Nausea And Vomiting  . Flagyl [Metronidazole]   . Lactose Intolerance (Gi)   . Macrodantin [Nitrofurantoin Macrocrystal]   . Morphine And Related     hallucinate  . Premarin [Conjugated Estrogens] Itching  . Zofran [Ondansetron Hcl]     HTN  . Nitrofuran Derivatives Rash  . Sulfa Antibiotics Rash

## 2013-06-25 NOTE — Telephone Encounter (Signed)
[id number].Cassandra Sims, CMA  

## 2013-06-25 NOTE — Telephone Encounter (Signed)
Returning call.Cassandra Sims ° °

## 2013-06-25 NOTE — Telephone Encounter (Signed)
lmtcb x1 

## 2013-06-28 NOTE — Telephone Encounter (Signed)
lmomtcb x1 

## 2013-06-28 NOTE — Telephone Encounter (Signed)
i spoke with pt ans is aware of recs. Nothing further needed

## 2013-06-28 NOTE — Telephone Encounter (Signed)
Ok to use zyrtec  I found the eye doctor message and sorry I missed the question being asked but ok to try the new glaucoma meds but be on the lookout for worse wheeze or cough and call her eye doctor if this happens within a week of the change, call me if happens later.  I will fax her eye doctor a response today

## 2013-06-28 NOTE — Telephone Encounter (Signed)
Returning call can be reached at [phone].Cassandra Sims

## 2013-07-05 ENCOUNTER — Encounter: Payer: Self-pay | Admitting: Internal Medicine

## 2013-07-05 ENCOUNTER — Ambulatory Visit (INDEPENDENT_AMBULATORY_CARE_PROVIDER_SITE_OTHER): Payer: Medicare Other | Admitting: Internal Medicine

## 2013-07-05 VITALS — BP 112/60 | HR 72 | Temp 98.0°F | Ht 62.5 in | Wt 114.0 lb

## 2013-07-05 DIAGNOSIS — J45909 Unspecified asthma, uncomplicated: Secondary | ICD-10-CM

## 2013-07-05 MED ORDER — PREDNISONE 10 MG PO TABS: ORAL_TABLET | ORAL | Status: DC

## 2013-07-05 MED ORDER — AZITHROMYCIN 250 MG PO TABS: ORAL_TABLET | ORAL | Status: DC

## 2013-07-05 NOTE — Patient Instructions (Signed)
zpak and Prednisone 10 mg take  4 each am x 2 days,   2 each am x 2 days,  1 each am x 2 days and stop   See calendar for specific medication instructions and bring it back for each and every office visit for every healthcare provider you see.  Without it,  you may not receive the best quality medical care that we feel you deserve.  You will note that the calendar groups together  your maintenance  medications that are timed at particular times of the day.  Think of this as your checklist for what your doctor has instructed you to do until your next evaluation to see what benefit  there is  to staying on a consistent group of medications intended to keep you well.  The other group at the bottom is entirely up to you to use as you see fit  for specific symptoms that may arise between visits that require you to treat them on an as needed basis.  Think of this as your action plan or "what if" list.   Separating the top medications from the bottom group is fundamental to providing you adequate care going forward.    Please schedule a follow up office visit in 3 weeks, sooner if needed

## 2013-07-05 NOTE — Progress Notes (Signed)
  Subjective:    Patient ID: Cassandra Sims, female    DOB: February 14, 1933   MRN: [id number]   Brief patient profile:  89 yowf never smoker with chronic rhinitis/sinusitis and recurrent cough and wheeze rx with advair chronically self referred 11/24/2012 to pulmonary clinic for eval of "chest congestion"    History of Present Illness  11/24/2012 1st pulmonary ov in EMR era cc daily congested cough x "maybe a decade" with minimal yellow mucus better on levaquin  and sob  while maintained  on advair bid with doe x  5-10 min of housework and walking aisles at grocery store  worsex sev months indolent onset, minimally progressive  and ? Better overall since last since last sinus surgery October 26 2012.   rec dulera 100 2bid plus diet   12/10/12 Added ppi/ h2 hs   07/05/2013 acute  ov/Ephriam Turman re: cough  Chief Complaint  Patient presents with  . Follow-up    Pt c/o increased congestion and cough for the past several wks. Cough is prod with minimal white sputum.    No increase sob over baseline or need for saba   No obvious day to day or daytime variabilty or assoc chronic  cp or chest tightness, subjective wheeze overt  hb symptoms. No unusual exp hx or h/o childhood pna/ asthma or knowledge of premature birth.  Sleeping ok without nocturnal  or early am exacerbation  of respiratory  c/o's or need for noct saba. Also denies any obvious fluctuation of symptoms with weather or environmental changes or other aggravating or alleviating factors except as outlined above   Current Medications, Allergies, Complete Past Medical History, Past Surgical History, Family History, and Social History were reviewed in [employer] record.  ROS  The following are not active complaints unless bolded sore throat, dysphagia, dental problems, itching, sneezing,  nasal congestion or excess/ purulent secretions, ear ache,   fever, chills, sweats, unintended wt loss, pleuritic or exertional cp, hemoptysis,   orthopnea pnd or leg swelling, presyncope, palpitations, heartburn, abdominal pain, anorexia, nausea, vomiting, diarrhea  or change in bowel or urinary habits, change in stools or urine, dysuria,hematuria,  rash, arthralgias, visual complaints, headache, numbness weakness or ataxia or problems with walking or coordination,  change in mood/affect or memory.                 Objective:   Physical Exam  Elderly amb wf nad with rattling congested cough   07/05/13         114  Wt Readings from Last 3 Encounters:  11/24/12 118 lb (53.524 kg)  10/26/12 115 lb 4 oz (52.277 kg)  10/26/12 115 lb 4 oz (52.277 kg)    HEENT: nl dentition, turbinates, and orophanx. Nl external ear canals without cough reflex   NECK :  without JVD/Nodes/TM/ nl carotid upstrokes bilaterally   LUNGS: no acc muscle use,  min exp rhonchi bilaterally without cough on insp or exp maneuvers   CV:  RRR  no s3 or murmur or increase in P2, no edema   ABD:  soft and nontender with nl excursion in the supine position. No bruits or organomegaly, bowel sounds nl  MS:  warm without deformities, calf tenderness, cyanosis or clubbing  SKIN: warm and dry without lesions    NEURO:  alert, approp, no deficits           Assessment & Plan:

## 2013-07-08 ENCOUNTER — Ambulatory Visit: Payer: Medicare Other | Admitting: Internal Medicine

## 2013-07-19 NOTE — Assessment & Plan Note (Addendum)
-   hfa 75% 11/24/12 -med calendar 12/10/2012  - ok'd for timolol eyedrops trial basis by [facility] 06/28/2013   Min flare in setting of rhinitis > rx zpak and short course prednisone  Ok to rechallenge with timolol eyedrops as her asthma is well controlled despite a typical trigger (uri/ rhinitis)    Each maintenance medication was reviewed in detail including most importantly the difference between maintenance and as needed and under what circumstances the prns are to be used. This was done in the context of a medication calendar review which provided the patient with a user-friendly unambiguous mechanism for medication administration and reconciliation and provides an action plan for all active problems. It is critical that this be shown to every doctor  for modification during the office visit if necessary so the patient can use it as a working document.

## 2013-07-26 ENCOUNTER — Encounter: Payer: Self-pay | Admitting: Internal Medicine

## 2013-07-26 ENCOUNTER — Ambulatory Visit (INDEPENDENT_AMBULATORY_CARE_PROVIDER_SITE_OTHER): Payer: Medicare Other | Admitting: Internal Medicine

## 2013-07-26 VITALS — BP 124/70 | HR 64 | Temp 97.9°F | Ht 62.5 in | Wt 117.8 lb

## 2013-07-26 DIAGNOSIS — J45909 Unspecified asthma, uncomplicated: Secondary | ICD-10-CM

## 2013-07-26 MED ORDER — PREDNISONE 10 MG PO TABS: ORAL_TABLET | ORAL | Status: DC

## 2013-07-26 NOTE — Progress Notes (Signed)
Subjective:    Patient ID: Cassandra Sims, female    DOB: May 03, 1933   MRN: [id number]   Brief patient profile:  36 yowf never smoker with chronic rhinitis/sinusitis and recurrent cough and wheeze rx with advair chronically self referred 11/24/2012 to pulmonary clinic for eval of "chest congestion"    History of Present Illness  11/24/2012 1st pulmonary ov in EMR era cc daily congested cough x "maybe a decade" with minimal yellow mucus better on levaquin  and sob  while maintained  on advair bid with doe x  5-10 min of housework and walking aisles at grocery store  worsex sev months indolent onset, minimally progressive  and ? Better overall since last since last sinus surgery October 26 2012.   rec dulera 100 2bid plus diet   12/10/12 Added ppi/ h2 hs   07/05/2013 acute  ov/Cassandra Sims re: cough  Chief Complaint  Patient presents with  . Follow-up    Pt c/o increased congestion and cough for the past several wks. Cough is prod with minimal white sputum.   No increase sob over baseline or need for saba  rec zpak and Prednisone 10 mg take  4 each am x 2 days,   2 each am x 2 days,  1 each am x 2 days and stop  See calendar  07/26/2013 f/u ov/Cassandra Sims re: ? Cough variant asthma  Chief Complaint  Patient presents with  . Follow-up    Pt states cough is much improved since last visit. Still has occ prod cough w/ minimal white sputum.     Doesn't tend to wake up Best response is with prednisone, some better with saba, using up to sev times a day    No obvious day to day or daytime variabilty or  sob  cp or chest tightness, subjective wheeze overt  hb symptoms. No unusual exp hx or h/o childhood pna/ asthma or knowledge of premature birth.  Sleeping ok without nocturnal  or early am exacerbation  of respiratory  c/o's or need for noct saba. Also denies any obvious fluctuation of symptoms with weather or environmental changes or other aggravating or alleviating factors except as outlined above    Current Medications, Allergies, Complete Past Medical History, Past Surgical History, Family History, and Social History were reviewed in [employer] record.  ROS  The following are not active complaints unless bolded sore throat, dysphagia, dental problems, itching, sneezing,  nasal congestion or excess/ purulent secretions, ear ache,   fever, chills, sweats, unintended wt loss, pleuritic or exertional cp, hemoptysis,  orthopnea pnd or leg swelling, presyncope, palpitations, heartburn, abdominal pain, anorexia, nausea, vomiting, diarrhea  or change in bowel or urinary habits, change in stools or urine, dysuria,hematuria,  rash, arthralgias, visual complaints, headache, numbness weakness or ataxia or problems with walking or coordination,  change in mood/affect or memory.                 Objective:   Physical Exam  Elderly amb wf nad with rattling congested cough   07/05/13         114  > 118 07/26/2013  Wt Readings from Last 3 Encounters:  11/24/12 118 lb (53.524 kg)  10/26/12 115 lb 4 oz (52.277 kg)  10/26/12 115 lb 4 oz (52.277 kg)    HEENT: nl dentition, turbinates, and orophanx. Nl external ear canals without cough reflex   NECK :  without JVD/Nodes/TM/ nl carotid upstrokes bilaterally   LUNGS: no acc muscle use,  min exp rhonchi bilaterally without cough on insp or exp maneuvers   CV:  RRR  no s3 or murmur or increase in P2, no edema   ABD:  soft and nontender with nl excursion in the supine position. No bruits or organomegaly, bowel sounds nl  MS:  warm without deformities, calf tenderness, cyanosis or clubbing  SKIN: warm and dry without lesions    NEURO:  alert, approp, no deficits           Assessment & Plan:

## 2013-07-26 NOTE — Patient Instructions (Addendum)
On a trial basis dulera 200 Take 2 puffs first thing in am and then another 2 puffs about 12 hours later.  Prednisone 10 mg take  4 each am x 2 days,   2 each am x 2 days,  1 each am x 2 days and stop   Work on inhaler technique:  relax and gently blow all the way out then take a nice smooth deep breath back in, triggering the inhaler at same time you start breathing in.  Hold for up to 5 seconds if you can.  Rinse and gargle with water when done   If your mouth or throat starts to bother you,   I suggest you time the inhaler to your dental care and after using the inhaler(s) brush teeth and tongue with a baking soda containing toothpaste and when you rinse this out, gargle with it first to see if this helps your mouth and throat.     See Tammy NP w/in 4 weeks with all your medications, even over the counter meds, separated in two separate bags, the ones you take no matter what vs the ones you stop once you feel better and take only as needed when you feel you need them.   Tammy  will generate for you a new user friendly medication calendar that will put [place] all on the same page re: your medication use.     Without this process, it simply isn't possible to assure that we are providing  your outpatient care  with  the attention to detail we feel you deserve.   If we cannot assure that you're getting that kind of care,  then we cannot manage your problem effectively from this clinic.  Once you have seen Tammy and we are sure that we're all on the same page with your medication use she will arrange follow up with me.

## 2013-07-27 NOTE — Assessment & Plan Note (Addendum)
-   hfa 75% 11/24/12 -med calendar 12/10/2012  - ok'd for timolol eyedrops trial basis by [facility] 06/28/2013 > never started - started dulera 100 2bid 07/27/2013   Chronicity and response to steroids   strongly support asthma here which is difficult to control  DDX of  difficult airways managment all start with A and  include Adherence, Ace Inhibitors, Acid Reflux, Active Sinus Disease, Alpha 1 Antitripsin deficiency, Anxiety masquerading as Airways dz,  ABPA,  allergy(esp in young), Aspiration (esp in elderly), Adverse effects of DPI,  Active smokers, plus two Bs  = Bronchiectasis and Beta blocker use..and one C= CHF  Adherence is always the initial "prime suspect" and is a multilayered concern that requires a "trust but verify" approach in every patient - starting with knowing how to use medications, especially inhalers, correctly, keeping up with refills and understanding the fundamental difference between maintenance and prns vs those medications only taken for a very short course and then stopped and not refilled.  The proper method of use, as well as anticipated side effects, of a metered-dose inhaler are discussed and demonstrated to the patient. Improved effectiveness after extensive coaching during this visit to a level of approximately  75% so try dulera 100 2bid - return for trust but verify medication reconciliatio / new med calendar  ? Acid (or non-acid) GERD > always difficult to exclude as up to 75% of pts in some series report no assoc GI/ Heartburn symptoms> rec continue  max (24h)  acid suppression and diet restrictions/ reviewed and instructions given in writing.   ? Beta blocker issues > prefer she not use timolol eye drops but if needed ok to challenge

## 2013-08-19 ENCOUNTER — Encounter: Payer: Medicare Other | Admitting: Adult Health

## 2013-08-23 ENCOUNTER — Encounter: Payer: Medicare Other | Admitting: Adult Health

## 2013-08-27 ENCOUNTER — Ambulatory Visit (INDEPENDENT_AMBULATORY_CARE_PROVIDER_SITE_OTHER): Payer: Medicare Other | Admitting: Adult Health

## 2013-08-27 ENCOUNTER — Encounter: Payer: Self-pay | Admitting: Adult Health

## 2013-08-27 VITALS — BP 120/66 | HR 65 | Temp 97.6°F | Ht 62.5 in | Wt 120.4 lb

## 2013-08-27 DIAGNOSIS — J45991 Cough variant asthma: Secondary | ICD-10-CM

## 2013-08-27 MED ORDER — AZITHROMYCIN 250 MG PO TABS: ORAL_TABLET | ORAL | Status: AC

## 2013-08-27 NOTE — Assessment & Plan Note (Signed)
Improved on present regimen  Patient's medications were reviewed today and patient education was given. Computerized medication calendar was adjusted/completed  Does have mild URI -zpack given to have on hold  Symptomatic tx encouarged.  If improved on return consider d/c PPI   Plan  Follow medication calendar closely and bring to each visit Z-Pak to have on hold if symptoms do not improve or worsen with discolored mucus. Follow up Dr. Melvyn Novas in 3 months and as needed

## 2013-08-27 NOTE — Patient Instructions (Addendum)
Follow medication calendar closely and bring to each visit Z-Pak to have on hold if symptoms do not improve or worsen with discolored mucus. Follow up Dr. Melvyn Novas in 3 months and as needed

## 2013-08-27 NOTE — Progress Notes (Signed)
Subjective:    Patient ID: Cassandra Sims, female    DOB: 26-Dec-1932   MRN: [id number]   Brief patient profile:  71 yowf never smoker with chronic rhinitis/sinusitis and recurrent cough and wheeze rx with advair chronically self referred 11/24/2012 to pulmonary clinic for eval of "chest congestion"    History of Present Illness  11/24/2012 1st pulmonary ov in EMR era cc daily congested cough x "maybe a decade" with minimal yellow mucus better on levaquin  and sob  while maintained  on advair bid with doe x  5-10 min of housework and walking aisles at grocery store  worsex sev months indolent onset, minimally progressive  and ? Better overall since last since last sinus surgery October 26 2012.   rec dulera 100 2bid plus diet   12/10/12 Added ppi/ h2 hs   07/05/2013 acute  ov/Wert re: cough  Chief Complaint  Patient presents with  . Follow-up    Pt c/o increased congestion and cough for the past several wks. Cough is prod with minimal white sputum.   No increase sob over baseline or need for saba  rec zpak and Prednisone 10 mg take  4 each am x 2 days,   2 each am x 2 days,  1 each am x 2 days and stop  See calendar  07/26/2013 f/u ov/Wert re: ? Cough variant asthma  Chief Complaint  Patient presents with  . Follow-up    Pt states cough is much improved since last visit. Still has occ prod cough w/ minimal white sputum.     Doesn't tend to wake up Best response is with prednisone, some better with saba, using up to sev times a day >>Dulera rx   08/27/2013 Follow up and Med Review  Pt returns for follow up and med review  We reviewed all her meds and organized them into a med calendar w/ pt education Appears to be taking her meds correctly.  Doing well until 2 days ago when she noticed some head congestion, PND, yellow nasal drainage, prod cough with same x2 days.   Denies f/c/s, wheezing, dyspnea, tightness, hemoptysis, nausea, vomiting.   Current Medications, Allergies, Complete Past  Medical History, Past Surgical History, Family History, and Social History were reviewed in [employer] record.  ROS  The following are not active complaints unless bolded sore throat, dysphagia, dental problems, itching, sneezing,  nasal congestion or excess/ purulent secretions, ear ache,   fever, chills, sweats, unintended wt loss, pleuritic or exertional cp, hemoptysis,  orthopnea pnd or leg swelling, presyncope, palpitations, heartburn, abdominal pain, anorexia, nausea, vomiting, diarrhea  or change in bowel or urinary habits, change in stools or urine, dysuria,hematuria,  rash, arthralgias, visual complaints, headache, numbness weakness or ataxia or problems with walking or coordination,  change in mood/affect or memory.                 Objective:   Physical Exam  Elderly amb wf nad    07/05/13         114  > 118 07/26/2013  >120 08/27/2013   HEENT: nl dentition, turbinates, and orophanx. Nl external ear canals without cough reflex   NECK :  without JVD/Nodes/TM/ nl carotid upstrokes bilaterally   LUNGS: no acc muscle use,  CTA no wheezing bilaterally without cough on insp or exp maneuvers   CV:  RRR  no s3 or murmur or increase in P2, no edema   ABD:  soft and nontender with  nl excursion in the supine position. No bruits or organomegaly, bowel sounds nl  MS:  warm without deformities, calf tenderness, cyanosis or clubbing  SKIN: warm and dry without lesions    NEURO:  alert, approp, no deficits           Assessment & Plan:

## 2013-08-27 NOTE — Addendum Note (Signed)
Addended by: Parke Poisson E on: 08/27/2013 11:41 AM   Modules accepted: Orders, Medications

## 2013-10-29 DIAGNOSIS — Z961 Presence of intraocular lens: Secondary | ICD-10-CM | POA: Insufficient documentation

## 2013-11-25 ENCOUNTER — Ambulatory Visit: Payer: Medicare Other | Admitting: Internal Medicine

## 2013-11-26 ENCOUNTER — Ambulatory Visit (INDEPENDENT_AMBULATORY_CARE_PROVIDER_SITE_OTHER): Payer: Medicare Other | Admitting: Internal Medicine

## 2013-11-26 ENCOUNTER — Encounter: Payer: Self-pay | Admitting: Internal Medicine

## 2013-11-26 VITALS — BP 120/60 | HR 60 | Temp 97.0°F | Ht 62.5 in | Wt 122.4 lb

## 2013-11-26 DIAGNOSIS — R059 Cough, unspecified: Secondary | ICD-10-CM

## 2013-11-26 DIAGNOSIS — J45991 Cough variant asthma: Secondary | ICD-10-CM

## 2013-11-26 DIAGNOSIS — R05 Cough: Secondary | ICD-10-CM

## 2013-11-26 MED ORDER — PREDNISONE 10 MG PO TABS: ORAL_TABLET | ORAL | Status: DC

## 2013-11-26 MED ORDER — AZELASTINE-FLUTICASONE 137-50 MCG/ACT NA SUSP: 1.0000 | Freq: Two times a day (BID) | NASAL | Status: DC

## 2013-11-26 NOTE — Patient Instructions (Signed)
Prednisone 10 mg take  4 each am x 2 days,   2 each am x 2 days,  1 each am x 2 days and stop   dymista 2 puffs each am  Work on inhaler technique:  relax and gently blow all the way out then take a nice smooth deep breath back in, triggering the inhaler at same time you start breathing in.  Hold for up to 5 seconds if you can.  Rinse and gargle with water when done  Change zyrtec to 10 mg at bedtime as needed for when itching sneezing is bad     Only use your albuterol(proair) as a rescue medication to be used if you can't catch your breath by resting or doing a relaxed purse lip breathing pattern.  - The less you use it, the better it will work when you need it. - Ok to use up to 2 puffs  every 4 hours if you must but call for immediate appointment if use goes up over your usual need - Don't leave home without it !!  (think of it like the spare tire for your car)   Please schedule a follow up visit in 3 months but call sooner if needed

## 2013-11-26 NOTE — Progress Notes (Signed)
Subjective:    Patient ID: Cassandra Sims, female    DOB: August 08, 1932   MRN: [id number]   Brief patient profile:  68 yowf never smoker with chronic rhinitis/sinusitis and recurrent cough x around 2004 and wheeze rx with advair chronically self referred 11/24/2012 to pulmonary clinic for eval of "chest congestion"   History of Present Illness  11/24/2012 1st pulmonary ov in EMR era cc daily congested cough x "maybe a decade" with minimal yellow mucus better on levaquin  and sob  while maintained  on advair bid with doe x  5-10 min of housework and walking aisles at grocery store  worsex sev months indolent onset, minimally progressive  and ? Better overall since last since last sinus surgery October 26 2012.   rec dulera 100 2bid plus diet   12/10/12 Added ppi/ h2 hs   07/05/2013 acute  ov/Christhoper Busbee re: cough  Chief Complaint  Patient presents with  . Follow-up    Pt c/o increased congestion and cough for the past several wks. Cough is prod with minimal white sputum.   No increase sob over baseline or need for saba  rec zpak and Prednisone 10 mg take  4 each am x 2 days,   2 each am x 2 days,  1 each am x 2 days and stop  See calendar  07/26/2013 f/u ov/Jaymee Tilson re: ? Cough variant asthma  Chief Complaint  Patient presents with  . Follow-up    Pt states cough is much improved since last visit. Still has occ prod cough w/ minimal white sputum.     Doesn't tend to wake up Best response is with prednisone, some better with saba, using up to sev times a day >>Dulera rx   08/27/2013 Follow up and Med Review  Pt returns for follow up and med review  We reviewed all her meds and organized them into a med calendar w/ pt education Appears to be taking her meds correctly.  Doing well until 2 days prior to OV  when she noticed some head congestion, PND, yellow nasal drainage, prod cough with same x 2 days rec Follow medication calendar closely and bring to each visit Z-Pak to have on hold if symptoms do not  improve or worsen with discolored mucus.       11/26/2013 f/u ov/Harlei Lehrmann re: prob cough variant asthma  Chief Complaint  Patient presents with  . Follow-up    Pt states that breathing is overall doing well. She c/o CP on and off for the past month- occurs mid am and is sharp and dull.  She also continues to have PND causing her to clear her throat. Has not had to use rescue inhaler since last visit.   cp x one month sev times per day lasts 5 min not related to activity or meals, slt L of midline like a knife and able to get around well though no heavy exertion.      No obvious day to day or daytime variabilty or assoc excess or purulent mucus or   chest tightness, subjective wheeze overt sinus or hb symptoms. No unusual exp hx or h/o childhood pna/ asthma or knowledge of premature birth.  Sleeping ok without nocturnal  or early am exacerbation  of respiratory  c/o's or need for noct saba. Also denies any obvious fluctuation of symptoms with weather or environmental changes or other aggravating or alleviating factors except as outlined above   Current Medications, Allergies, Complete Past Medical History, Past Surgical  History, Family History, and Social History were reviewed in [employer] record.  ROS  The following are not active complaints unless bolded sore throat, dysphagia, dental problems, itching, sneezing,  nasal congestion or excess/ purulent secretions, ear ache,   fever, chills, sweats, unintended wt loss, classically pleuritic pleuritic or exertional cp, hemoptysis,  orthopnea pnd or leg swelling, presyncope, palpitations, heartburn, abdominal pain, anorexia, nausea, vomiting, diarrhea  or change in bowel or urinary habits, change in stools or urine, dysuria,hematuria,  rash, arthralgias, visual complaints, headache, numbness weakness or ataxia or problems with walking or coordination,  change in mood/affect or memory.                      Objective:    Physical Exam  Elderly amb wf nad    07/05/13         114  > 118 07/26/2013  >120 08/27/2013 > 11/26/2013 122   HEENT: nl dentition, turbinates, and orophanx. Nl external ear canals without cough reflex   NECK :  without JVD/Nodes/TM/ nl carotid upstrokes bilaterally   LUNGS: no acc muscle use,  CTA no wheezing bilaterally without cough on insp or exp maneuvers   CV:  RRR  no s3 or murmur or increase in P2, no edema   ABD:  soft and nontender with nl excursion in the supine position. No bruits or organomegaly, bowel sounds nl  MS:  warm without deformities, calf tenderness, cyanosis or clubbing  SKIN: warm and dry without lesions    NEURO:  alert, approp, no deficits           Assessment & Plan:

## 2013-11-27 NOTE — Assessment & Plan Note (Signed)
-  med calendar 12/10/2012 , 08/27/2013  - ok'd for timolol eyedrops trial basis by [facility] 06/28/2013 > never started  - started dulera 100 2bid 07/27/2013 > improved  Still has features also suggesting uacs discussed separately.  The proper method of use, as well as anticipated side effects, of a metered-dose inhaler are discussed and demonstrated to the patient. Improved effectiveness after extensive coaching during this visit to a level of approximately  75% from baseline of < 50% so should be ok control on dulera 100 2bid

## 2013-11-27 NOTE — Assessment & Plan Note (Signed)
Has been using zyrtec as a maint, not prn  rec change zyrtec to prn and in meantime add dymista each am to list of "automatics" on med calendar    Each maintenance medication was reviewed in detail including most importantly the difference between maintenance and as needed and under what circumstances the prns are to be used. This was done in the context of a medication calendar review which provided the patient with a user-friendly unambiguous mechanism for medication administration and reconciliation and provides an action plan for all active problems. It is critical that this be shown to every doctor  for modification during the office visit if necessary so the patient can use it as a working document.

## 2013-11-29 ENCOUNTER — Telehealth: Payer: Self-pay | Admitting: Internal Medicine

## 2013-11-29 MED ORDER — ALBUTEROL SULFATE HFA 108 (90 BASE) MCG/ACT IN AERS: 2.0000 | INHALATION_SPRAY | RESPIRATORY_TRACT | Status: DC | PRN

## 2013-11-29 NOTE — Telephone Encounter (Signed)
Spoke with pt. Aware RX has been sent. Nothing further needed 

## 2013-12-01 ENCOUNTER — Telehealth: Payer: Self-pay | Admitting: Internal Medicine

## 2013-12-01 NOTE — Telephone Encounter (Signed)
ATC lie busy x 4 wcb  No samples at this time

## 2013-12-01 NOTE — Telephone Encounter (Signed)
Pt states that she went to pick up her ProAir hfa and co-pay was $69 Pt states that she cannot afford this at this time and does not have an inhaler to use. Requesting sample.  Aware that we do not have any samples at this time of ProAir. Pt asking when we get samples of those in - advised to call back in about a week to check again to see if we have any samples.  -------- Called and spoke with [facility]--cheaper alternatives? Ventolin copay $30  Will send to Dr Melvyn Novas as Juluis Rainier of this change.  Nothing further needed.

## 2013-12-23 DIAGNOSIS — I10 Essential (primary) hypertension: Secondary | ICD-10-CM | POA: Insufficient documentation

## 2013-12-23 DIAGNOSIS — E785 Hyperlipidemia, unspecified: Secondary | ICD-10-CM | POA: Insufficient documentation

## 2014-03-16 ENCOUNTER — Ambulatory Visit (INDEPENDENT_AMBULATORY_CARE_PROVIDER_SITE_OTHER): Payer: Medicare Other | Admitting: Internal Medicine

## 2014-03-16 ENCOUNTER — Encounter: Payer: Self-pay | Admitting: Internal Medicine

## 2014-03-16 VITALS — BP 130/80 | HR 90 | Temp 97.0°F | Ht 62.0 in | Wt 118.2 lb

## 2014-03-16 DIAGNOSIS — J45991 Cough variant asthma: Secondary | ICD-10-CM

## 2014-03-16 NOTE — Patient Instructions (Addendum)
Reduce the dulera to one twice daily to see if helps and if not return with your medications to see Tammy  See calendar for specific medication instructions and bring it back for each and every office visit for every healthcare provider you see.  Without it,  you may not receive the best quality medical care that we feel you deserve.  You will note that the calendar groups together  your maintenance  medications that are timed at particular times of the day.  Think of this as your checklist for what your doctor has instructed you to do until your next evaluation to see what benefit  there is  to staying on a consistent group of medications intended to keep you well.  The other group at the bottom is entirely up to you to use as you see fit  for specific symptoms that may arise between visits that require you to treat them on an as needed basis.  Think of this as your action plan or "what if" list.   Separating the top medications from the bottom group is fundamental to providing you adequate care going forward.    If not happy with care, first return to see tammy with all meds/ inhalers in hand

## 2014-03-16 NOTE — Progress Notes (Signed)
Subjective:    Patient ID: Cassandra Sims, female    DOB: 05-28-1933   MRN: [id number]   Brief patient profile:  6 yowf never smoker with chronic rhinitis/sinusitis and recurrent cough x around 2004 and wheeze rx with advair chronically self referred 11/24/2012 to pulmonary clinic for eval of "chest congestion"   History of Present Illness  11/24/2012 1st pulmonary ov in EMR era cc daily congested cough x "maybe a decade" with minimal yellow mucus better on levaquin  and sob  while maintained  on advair bid with doe x  5-10 min of housework and walking aisles at grocery store  worsex sev months indolent onset, minimally progressive  and ? Better overall since last since last sinus surgery October 26 2012.   rec dulera 100 2bid plus diet   12/10/12 Added ppi/ h2 hs   07/05/2013 acute  ov/Cassandra Sims re: cough  Chief Complaint  Patient presents with  . Follow-up    Pt c/o increased congestion and cough for the past several wks. Cough is prod with minimal white sputum.   No increase sob over baseline or need for saba  rec zpak and Prednisone 10 mg take  4 each am x 2 days,   2 each am x 2 days,  1 each am x 2 days and stop  See calendar  07/26/2013 f/u ov/Cassandra Sims re: ? Cough variant asthma  Chief Complaint  Patient presents with  . Follow-up    Pt states cough is much improved since last visit. Still has occ prod cough w/ minimal white sputum.     Doesn't tend to wake up Best response is with prednisone, some better with saba, using up to sev times a day >>Dulera rx   08/27/2013 Follow up and Med Review  Pt returns for follow up and med review  We reviewed all her meds and organized them into a med calendar w/ pt education Appears to be taking her meds correctly.  Doing well until 2 days prior to OV  when she noticed some head congestion, PND, yellow nasal drainage, prod cough with same x 2 days rec Follow medication calendar closely and bring to each visit Z-Pak to have on hold if symptoms do not  improve or worsen with discolored mucus.       11/26/2013 f/u ov/Cassandra Sims re: prob cough variant asthma  Chief Complaint  Patient presents with  . Follow-up    Pt states that breathing is overall doing well. She c/o CP on and off for the past month- occurs mid am and is sharp and dull.  She also continues to have PND causing her to clear her throat. Has not had to use rescue inhaler since last visit.   cp x one month sev times per day lasts 5 min not related to activity or meals, slt L of midline like a knife and able to get around well though no heavy exertion rec Prednisone 10 mg take  4 each am x 2 days,   2 each am x 2 days,  1 each am x 2 days and stop  dymista 2 puffs each am Work on inhaler technique:   Change zyrtec to 10 mg at bedtime as needed for when itching sneezing is bad    Only use your albuterol (proair)   03/16/2014 f/u ov/Cassandra Sims re: chronic cough/ no med calendar  Chief Complaint  Patient presents with  . Follow-up    coughing greenish   rarely cough up anything at all  and not disturbing sleep   Not using saba hfa or neb on dulera 100 2bid    No obvious day to day or daytime variabilty or assoc excess or purulent mucus or   chest tightness, subjective wheeze overt sinus or hb symptoms. No unusual exp hx or h/o childhood pna/ asthma or knowledge of premature birth.  Sleeping ok without nocturnal  or early am exacerbation  of respiratory  c/o's or need for noct saba. Also denies any obvious fluctuation of symptoms with weather or environmental changes or other aggravating or alleviating factors except as outlined above   Current Medications, Allergies, Complete Past Medical History, Past Surgical History, Family History, and Social History were reviewed in [employer] record.  ROS  The following are not active complaints unless bolded sore throat, dysphagia, dental problems, itching, sneezing,  nasal congestion or excess/ purulent secretions, ear  ache,   fever, chills, sweats, unintended wt loss, classically pleuritic pleuritic or exertional cp, hemoptysis,  orthopnea pnd or leg swelling, presyncope, palpitations, heartburn, abdominal pain, anorexia, nausea, vomiting, diarrhea  or change in bowel or urinary habits, change in stools or urine, dysuria,hematuria,  rash, arthralgias, visual complaints, headache, numbness weakness or ataxia or problems with walking or coordination,  change in mood/affect or memory.                      Objective:   Physical Exam  Elderly amb wf nad    07/05/13         114  > 118 07/26/2013  >120 08/27/2013 > 11/26/2013 122 >  03/16/2014  118   HEENT: nl dentition, turbinates, and orophanx. Nl external ear canals without cough reflex   NECK :  without JVD/Nodes/TM/ nl carotid upstrokes bilaterally   LUNGS: no acc muscle use,  CTA no wheezing bilaterally without cough on insp or exp maneuvers   CV:  RRR  no s3 or murmur or increase in P2, no edema   ABD:  soft and nontender with nl excursion in the supine position. No bruits or organomegaly, bowel sounds nl  MS:  warm without deformities, calf tenderness, cyanosis or clubbing  SKIN: warm and dry without lesions    NEURO:  alert, approp, no deficits     11/24/13 cxr  Underlying emphysema with evidence of prior granulomatous disease  and areas of bibasilar scarring. No edema or consolidation. There  is stable persistent elevation of the left hemidiaphragm.        Assessment & Plan:

## 2014-03-18 NOTE — Assessment & Plan Note (Addendum)
-  med calendar 12/10/2012 , 08/27/2013  - ok'd for timolol eyedrops trial basis by [facility] 06/28/2013 > never started  - started dulera 100 2bid 07/27/2013 > improved 11/26/13> reduced to dulera 100 1bid 03/16/14 due to concern of irritation of upper airway   DDX of  difficult airways management all start with A and  include Adherence, Ace Inhibitors, Acid Reflux, Active Sinus Disease, Alpha 1 Antitripsin deficiency, Anxiety masquerading as Airways dz,  ABPA,  allergy(esp in young), Aspiration (esp in elderly), Adverse effects of DPI,  Active smokers, plus two Bs  = Bronchiectasis and Beta blocker use..and one C= CHF  Adherence is always the initial "prime suspect" and is a multilayered concern that requires a "trust but verify" approach in every patient - starting with knowing how to use medications, especially inhalers, correctly, keeping up with refills and understanding the fundamental difference between maintenance and prns vs those medications only taken for a very short course and then stopped and not refilled.  - unable to verify using meds correctly:  To keep things simple, I have asked the patient to first separate medicines that are perceived as maintenance, that is to be taken daily "no matter what", from those medicines that are taken on only on an as-needed basis and I have given the patient examples of both, and then return to see our NP to generate a  detailed  medication calendar which should be followed until the next physician sees the patient and updates it.  - The proper method of use, as well as anticipated side effects, of a metered-dose inhaler are discussed and demonstrated to the patient. Improved effectiveness after extensive coaching during this visit to a level of approximately  75%   ? Acid (or non-acid) GERD > always difficult to exclude as up to 75% of pts in some series report no assoc GI/ Heartburn symptoms> rec continue max (24h)  acid suppression and diet restrictions/ reviewed      ? Active sinus dz > if not improving next step is sinus CT or return to Dr Constance Holster     Each maintenance medication was reviewed in detail including most importantly the difference between maintenance and as needed and under what circumstances the prns are to be used.  Please see instructions for details which were reviewed in writing and the patient given a copy.

## 2014-03-24 DIAGNOSIS — Z961 Presence of intraocular lens: Secondary | ICD-10-CM | POA: Insufficient documentation

## 2014-11-02 ENCOUNTER — Telehealth: Payer: Self-pay | Admitting: Internal Medicine

## 2014-11-02 NOTE — Telephone Encounter (Signed)
Spoke with pt, advised her that we do not have any dulera 100 samples at this time.  Nothing further needed.

## 2015-03-07 ENCOUNTER — Encounter: Payer: Self-pay | Admitting: Physical Therapy

## 2015-03-07 ENCOUNTER — Ambulatory Visit: Payer: Medicare Other | Attending: Orthopaedic Surgery | Admitting: Physical Therapy

## 2015-03-07 DIAGNOSIS — M25632 Stiffness of left wrist, not elsewhere classified: Secondary | ICD-10-CM | POA: Diagnosis present

## 2015-03-07 DIAGNOSIS — M25532 Pain in left wrist: Secondary | ICD-10-CM | POA: Insufficient documentation

## 2015-03-07 DIAGNOSIS — M25511 Pain in right shoulder: Secondary | ICD-10-CM

## 2015-03-07 NOTE — Patient Instructions (Signed)
Wrist: Flexion   Rest arm with elbow on padded surface. Let wrist drop down. Apply gentle downward push with fingers of other hand. Hold __20__ seconds. Repeat __5__ times. Do _2___ sessions per day. CAUTION: Stretch slowly and gently. Do not force joint. Activity: Rest chin on back of hand with elbow on firm surface.*    Wrist Extension: Lowering (Eccentric), Assist - Elbow Extended   Arm on table, elbow straight, palm down. Use other hand to lift affected hand, bending wrist up. Slowly lower hand for 3-5 seconds. _10__ reps per set, _2__ sets, _2_ times per day.   Composite Extension (Passive Flexor Stretch)   Sitting with elbows on table and palms together, slowly lower wrists toward table until stretch is felt. Be sure to keep palms together throughout stretch. Hold _20___ seconds. Relax. Repeat _5___ times. Do __2__ sessions per day.

## 2015-03-07 NOTE — Therapy (Signed)
[place], [place], [address] Phone: [phone]   Fax:  [phone]  Physical Therapy Evaluation  Patient Details  Name: Cassandra Sims MRN: [id number] Date of Birth: 10/20/1932 Referring Provider:  Melrose Nakayama, MD  Encounter Date: 03/07/2015      PT End of Session - 03/07/15 1033    Visit Number 1   Date for PT Re-Evaluation 05/07/15   PT Start Time 1003   PT Stop Time 1043   PT Time Calculation (min) 40 min   Activity Tolerance Patient tolerated treatment well   Behavior During Therapy [facility] for tasks assessed/performed      Past Medical History  Diagnosis Date  . Hypertension   . Arthritis   . GERD (gastroesophageal reflux disease)   . Asthma   . HOH (hard of hearing)   . Wears glasses   . Glaucoma   . Multiple allergies   . Rhinitis   . Complication of anesthesia     hard to wake up  . PONV (postoperative nausea and vomiting)   . Peripheral vascular disease     varicose veins     Past Surgical History  Procedure Laterality Date  . Tonsillectomy    . Appendectomy    . Abdominal hysterectomy    . Functional endoscopic sinus surgery  2009    bilat ethm,frontal,max  . Colectomy  2008    obstruction  . Ureter surgery      cut accidentally during exp lap-ovarien cyst  . Dilation and curettage of uterus    . Tubal ligation    . Diagnostic laparoscopy      exp  . Ovarian cyst surgery    . Nasal sinus surgery Bilateral 10/26/2012    Procedure: BILATERAL ENDOSCOPIC REVISION ETHMOID MAXILLARY AND FRONTAL SINUS SURGERY  ;  Surgeon: Izora Gala, MD;  Location: [place];  Service: ENT;  Laterality: Bilateral;    There were no vitals filed for this visit.  Visit Diagnosis:  Left wrist pain - Plan: PT plan of care cert/re-cert  Wrist stiffness, left - Plan: PT plan of care cert/re-cert  Right shoulder pain - Plan: PT plan of care cert/re-cert       Subjective Assessment - 03/07/15 1006    Subjective Patient reports that she fell on May 15th, she reports she was getting out of the bath tub and slipped, she broke her left wrist, also caused some pain in the right shoulder.  She was in a brace for about 8 weeks, recent x-rays show good healing   Limitations Lifting;House hold activities   Patient Stated Goals less pain and easier motions of the wrist and shoulder   Currently in Pain? Yes   Pain Score 3    Pain Location Wrist  also pain in the right shoulder and left leg (lateral calf)   Pain Orientation Left   Pain Descriptors / Indicators Aching   Pain Onset More than a month ago   Pain Frequency Constant   Aggravating Factors  any motions   Pain Relieving Factors heat   Effect of Pain on Daily Activities limits ability to use the left hand            [facility] PT Assessment - 03/07/15 0001    Assessment   Medical Diagnosis left wrist pain, right shoulder pain   Onset Date/Surgical Date 12/04/14   Hand Dominance Right   Prior Therapy no   Precautions  Precautions None   Balance Screen   Has the patient fallen in the past 6 months Yes   How many times? 1   Has the patient had a decrease in activity level because of a fear of falling?  No   Is the patient reluctant to leave their home because of a fear of falling?  No   Home Ec[employer] residence   Additional Comments does her own housework and some gardening   Prior Function   Level of Independence Independent   Leisure walks everyday   Posture/Postural Control   Posture Comments fwd head, rounded shoulders   AROM   Overall AROM Comments left wrist pronation WFL's, supination 60 degrees, right shoulder AROM flexion/abduction 120, ER WNL's and IR 45 degrees with some pain   Left Wrist Extension 45 Degrees   Left Wrist Flexion 45 Degrees   Strength   Overall Strength Comments left wrist strength 3+/5 due to pain , grip on the right 35#, left  20#   Special Tests    Special Tests --  pain with empty can but with good strength                             PT Short Term Goals - Mar 21, 2015 1036    PT SHORT TERM GOAL #1   Title independent with initial HEP   Time 2   Period Weeks   Status New           PT Long Term Goals - Mar 21, 2015 1036    PT LONG TERM GOAL #1   Title increase AROM of the left wrist to 70 degrees flexion and extension   Time 8   Period Weeks   Status New   PT LONG TERM GOAL #2   Title decrease pain 50% with activity   Time 8   Period Weeks   Status New   PT LONG TERM GOAL #3   Title increase left forarm supination to WNL's   Time 8   Period Weeks   Status New   PT LONG TERM GOAL #4   Title increase left grip strength to 30 #   Time 8   Period Weeks   Status New               Plan - 03/21/2015 1034    Clinical Impression Statement Patient with decreased ROM, strength and function of the left wrist, she also has pain in the right shoulder, has difficulty reaching and lifting anything.   Pt will benefit from skilled therapeutic intervention in order to improve on the following deficits Decreased range of motion;Decreased strength;Impaired UE functional use;Pain   Rehab Potential Good   PT Frequency 2x / week   PT Duration 8 weeks   PT Treatment/Interventions Electrical Stimulation;Cryotherapy;Moist Heat;Therapeutic exercise;Therapeutic activities;Patient/family education;Manual techniques;Passive range of motion   PT Next Visit Plan slowly add exercises, PROM   Consulted and Agree with Plan of Care Patient          G-Codes - March 21, 2015 1038    Functional Assessment Tool Used foto   Functional Limitation Other PT primary   Other PT Primary Current Status (N8676) At least 40 percent but less than 60 percent impaired, limited or restricted   Other PT Primary Goal Status (H2094) At least 40 percent but less than 60 percent impaired, limited or restricted        Problem List Patient Active  Problem List   Diagnosis Date Noted  . Sinusitis, chronic 11/25/2012  . Cough variant asthma 11/25/2012  . Cough 11/24/2012    Sumner Boast., PT 03/07/2015, 10:41 AM  [place] [address] [place], [place], [address] Phone: [phone]   Fax:  [phone]

## 2015-03-10 ENCOUNTER — Ambulatory Visit: Payer: Medicare Other | Admitting: Rehabilitation

## 2015-03-10 DIAGNOSIS — M25532 Pain in left wrist: Secondary | ICD-10-CM

## 2015-03-10 DIAGNOSIS — M25632 Stiffness of left wrist, not elsewhere classified: Secondary | ICD-10-CM

## 2015-03-10 NOTE — Therapy (Signed)
[place], [place], [address] Phone: [phone]   Fax:  [phone]  Physical Therapy Treatment  Patient Details  Name: Cassandra Sims MRN: [id number] Date of Birth: October 03, 1932 Referring Provider:  Melrose Nakayama, MD  Encounter Date: 03/10/2015      PT End of Session - 03/10/15 0908    Visit Number 2   Number of Visits --   PT Start Time 0850   PT Stop Time 0930   PT Time Calculation (min) 40 min      Past Medical History  Diagnosis Date  . Hypertension   . Arthritis   . GERD (gastroesophageal reflux disease)   . Asthma   . HOH (hard of hearing)   . Wears glasses   . Glaucoma   . Multiple allergies   . Rhinitis   . Complication of anesthesia     hard to wake up  . PONV (postoperative nausea and vomiting)   . Peripheral vascular disease     varicose veins     Past Surgical History  Procedure Laterality Date  . Tonsillectomy    . Appendectomy    . Abdominal hysterectomy    . Functional endoscopic sinus surgery  2009    bilat ethm,frontal,max  . Colectomy  2008    obstruction  . Ureter surgery      cut accidentally during exp lap-ovarien cyst  . Dilation and curettage of uterus    . Tubal ligation    . Diagnostic laparoscopy      exp  . Ovarian cyst surgery    . Nasal sinus surgery Bilateral 10/26/2012    Procedure: BILATERAL ENDOSCOPIC REVISION ETHMOID MAXILLARY AND FRONTAL SINUS SURGERY  ;  Surgeon: Izora Gala, MD;  Location: [place];  Service: ENT;  Laterality: Bilateral;    There were no vitals filed for this visit.  Visit Diagnosis:  Left wrist pain  Wrist stiffness, left      Subjective Assessment - 03/10/15 0905    Subjective Patient c/o of significant increase in pain in the R wrist (not L) to the point that she actually made an ortho appointment for later today.    The R wrist is indeed some swollen and warm.    Active flexion and passive  extension are quite painful.    She has been icing it at home and is some better than yesterday.    States she thinks the prayer stretch aggravated this wrist.    She is advised to stop prayer stretch for now.   Currently in Pain? Yes   Pain Score 7    Pain Location Wrist   Pain Orientation Right   Pain Descriptors / Indicators Sharp;Throbbing;Tender   Pain Type Acute pain   Pain Onset In the past 7 days   Pain Frequency Constant   Aggravating Factors  any R wrist activity   Pain Relieving Factors ice and meds                         [facility] Adult PT Treatment/Exercise - 03/10/15 0001    Exercises   Exercises Wrist   Wrist Exercises   Forearm Supination PROM;Left   Other wrist exercises table slide wrist flex and ext stretches x 1' x 3 ea;  wall slide flexion stretch x 1' x 3;   ext stretch x 1' x 3   Other wrist exercises velcro board pronate/supinate  x 20;  velcro finger roller   Modalities   Modalities Cryotherapy;Paraffin   Cryotherapy   Number Minutes Cryotherapy 15 Minutes   Type of Cryotherapy Ice pack   LUE Paraffin   LUE Paraffin Location Hand;Wrist   Manual Therapy   Manual Therapy Joint mobilization   Joint Mobilization grade 3-4 x 30-40 rep to L wrist in all planes                PT Education - 03/10/15 0903    Education provided Yes   Education Details wall slide wrist flex/ext stretches to tolerance   Person(s) Educated Patient   Methods Verbal cues;Tactile cues   Comprehension Need further instruction          PT Short Term Goals - 03/07/15 1036    PT SHORT TERM GOAL #1   Title independent with initial HEP   Time 2   Period Weeks   Status New           PT Long Term Goals - 03/07/15 1036    PT LONG TERM GOAL #1   Title increase AROM of the left wrist to 70 degrees flexion and extension   Time 8   Period Weeks   Status New   PT LONG TERM GOAL #2   Title decrease pain 50% with activity   Time 8   Period Weeks    Status New   PT LONG TERM GOAL #3   Title increase left forarm supination to WNL's   Time 8   Period Weeks   Status New   PT LONG TERM GOAL #4   Title increase left grip strength to 30 #   Time 8   Period Weeks   Status New               Plan - 03/10/15 0910    Clinical Impression Statement Deferred treatment on R shoulder today as patient cannot use her R hand without ventral wrist pain along finger/wrist flexor tendons.   She is not c/o much with R shoulder today.    L wrist is also She appears to have have some r wrist strain likely from being overzealous with prayer stretching.    She is to dc the prayer stretch for now.  When she f/u with ortho later today she is advised to get a script for PT for R wrist strain, and we can treat this PRN.     PT Next Visit Plan reassess R wrist for treatment if we get a script to treat        Problem List Patient Active Problem List   Diagnosis Date Noted  . Sinusitis, chronic 11/25/2012  . Cough variant asthma 11/25/2012  . Cough 11/24/2012    Elfreida Heggs, PT 03/10/2015, 1:19 PM  [place], [place], [address] Phone: [phone]   Fax:  [phone]

## 2015-03-16 ENCOUNTER — Ambulatory Visit: Payer: Medicare Other | Admitting: Physical Therapy

## 2015-03-16 ENCOUNTER — Encounter: Payer: Self-pay | Admitting: Physical Therapy

## 2015-03-16 DIAGNOSIS — M25511 Pain in right shoulder: Secondary | ICD-10-CM

## 2015-03-16 DIAGNOSIS — M25532 Pain in left wrist: Secondary | ICD-10-CM

## 2015-03-16 DIAGNOSIS — M25632 Stiffness of left wrist, not elsewhere classified: Secondary | ICD-10-CM

## 2015-03-16 NOTE — Therapy (Signed)
[place], [place], [address] Phone: [phone]   Fax:  [phone]  Physical Therapy Treatment  Patient Details  Name: Cassandra Sims MRN: [id number] Date of Birth: Nov 18, 1932 Referring Provider:  Melrose Nakayama, MD  Encounter Date: 03/16/2015      PT End of Session - 03/16/15 1115    Visit Number 3   Date for PT Re-Evaluation 05/07/15   PT Start Time 2035   PT Stop Time 1150   PT Time Calculation (min) 45 min      Past Medical History  Diagnosis Date  . Hypertension   . Arthritis   . GERD (gastroesophageal reflux disease)   . Asthma   . HOH (hard of hearing)   . Wears glasses   . Glaucoma   . Multiple allergies   . Rhinitis   . Complication of anesthesia     hard to wake up  . PONV (postoperative nausea and vomiting)   . Peripheral vascular disease     varicose veins     Past Surgical History  Procedure Laterality Date  . Tonsillectomy    . Appendectomy    . Abdominal hysterectomy    . Functional endoscopic sinus surgery  2009    bilat ethm,frontal,max  . Colectomy  2008    obstruction  . Ureter surgery      cut accidentally during exp lap-ovarien cyst  . Dilation and curettage of uterus    . Tubal ligation    . Diagnostic laparoscopy      exp  . Ovarian cyst surgery    . Nasal sinus surgery Bilateral 10/26/2012    Procedure: BILATERAL ENDOSCOPIC REVISION ETHMOID MAXILLARY AND FRONTAL SINUS SURGERY  ;  Surgeon: Izora Gala, MD;  Location: [place];  Service: ENT;  Laterality: Bilateral;    There were no vitals filed for this visit.  Visit Diagnosis:  Left wrist pain  Right shoulder pain  Wrist stiffness, left      Subjective Assessment - 03/16/15 1106    Subjective saw MD- new script to treat RT hand ( [place] /ionto)   Currently in Pain? Yes   Pain Score 5    Pain Location Wrist   Pain Orientation Right;Left                          [facility] Adult PT Treatment/Exercise - 03/16/15 0001    Exercises   Exercises Shoulder   Shoulder Exercises: Seated   Other Seated Exercises UBE 2 fwd/2 back   Shoulder Exercises: Standing   Other Standing Exercises 3# cane ther ex standing 10 times each   Wrist Exercises   Other wrist exercises ball bounce on trampolinr for grip and sup/pron   Other wrist exercises velcro board pronate/supinate x 20;  velcro finger roller   Modalities   Modalities Moist Heat;Electrical Stimulation;Iontophoresis   Moist Heat Therapy   Number Minutes Moist Heat 15 Minutes   Moist Heat Location Hand   Cryotherapy   Number Minutes Cryotherapy 15 Minutes   Cryotherapy Location Shoulder   Type of Cryotherapy Ice pack   Electrical Stimulation   Electrical Stimulation Location wrist   Electrical Stimulation Parameters premod   Electrical Stimulation Goals Pain   Iontophoresis   Type of Iontophoresis Dexamethasone   Location RT wrist   Dose 1.2   Time 4 hour leave on patch  PT Short Term Goals - 03/07/15 1036    PT SHORT TERM GOAL #1   Title independent with initial HEP   Time 2   Period Weeks   Status New           PT Long Term Goals - 03/07/15 1036    PT LONG TERM GOAL #1   Title increase AROM of the left wrist to 70 degrees flexion and extension   Time 8   Period Weeks   Status New   PT LONG TERM GOAL #2   Title decrease pain 50% with activity   Time 8   Period Weeks   Status New   PT LONG TERM GOAL #3   Title increase left forarm supination to WNL's   Time 8   Period Weeks   Status New   PT LONG TERM GOAL #4   Title increase left grip strength to 30 #   Time 8   Period Weeks   Status New               Plan - 03/16/15 1115    Clinical Impression Statement added ionto and estim treatment to tx per MD orders and discussion with PT M. Albright ( PT to see next session). Tolerated therapy well.   PT Next  Visit Plan assess how modalites helped        Problem List Patient Active Problem List   Diagnosis Date Noted  . Sinusitis, chronic 11/25/2012  . Cough variant asthma 11/25/2012  . Cough 11/24/2012    Broady Lafoy,ANGIE PTA  03/16/2015, 11:21 AM  [place] [address] [place], [place], [address] Phone: [phone]   Fax:  [phone]

## 2015-03-21 ENCOUNTER — Ambulatory Visit: Payer: Medicare Other | Admitting: Physical Therapy

## 2015-03-21 ENCOUNTER — Encounter: Payer: Self-pay | Admitting: Physical Therapy

## 2015-03-21 DIAGNOSIS — M25532 Pain in left wrist: Secondary | ICD-10-CM | POA: Diagnosis not present

## 2015-03-21 DIAGNOSIS — M25632 Stiffness of left wrist, not elsewhere classified: Secondary | ICD-10-CM

## 2015-03-21 DIAGNOSIS — M25511 Pain in right shoulder: Secondary | ICD-10-CM

## 2015-03-21 NOTE — Therapy (Signed)
[place], [place], [address] Phone: [phone]   Fax:  [phone]  Physical Therapy Treatment  Patient Details  Name: Cassandra Sims MRN: [id number] Date of Birth: 26-Feb-1933 Referring Provider:  Melrose Nakayama, MD  Encounter Date: 03/21/2015      PT End of Session - 03/21/15 1147    Visit Number 4   Date for PT Re-Evaluation 05/07/15   PT Start Time 1109   PT Stop Time 1203   PT Time Calculation (min) 54 min   Activity Tolerance Patient tolerated treatment well   Behavior During Therapy [facility] for tasks assessed/performed      Past Medical History  Diagnosis Date  . Hypertension   . Arthritis   . GERD (gastroesophageal reflux disease)   . Asthma   . HOH (hard of hearing)   . Wears glasses   . Glaucoma   . Multiple allergies   . Rhinitis   . Complication of anesthesia     hard to wake up  . PONV (postoperative nausea and vomiting)   . Peripheral vascular disease     varicose veins     Past Surgical History  Procedure Laterality Date  . Tonsillectomy    . Appendectomy    . Abdominal hysterectomy    . Functional endoscopic sinus surgery  2009    bilat ethm,frontal,max  . Colectomy  2008    obstruction  . Ureter surgery      cut accidentally during exp lap-ovarien cyst  . Dilation and curettage of uterus    . Tubal ligation    . Diagnostic laparoscopy      exp  . Ovarian cyst surgery    . Nasal sinus surgery Bilateral 10/26/2012    Procedure: BILATERAL ENDOSCOPIC REVISION ETHMOID MAXILLARY AND FRONTAL SINUS SURGERY  ;  Surgeon: Izora Gala, MD;  Location: [place];  Service: ENT;  Laterality: Bilateral;    There were no vitals filed for this visit.  Visit Diagnosis:  Left wrist pain  Right shoulder pain  Wrist stiffness, left      Subjective Assessment - 03/21/15 1114    Subjective Reports that she is progressing, but slow, still sore and painful  with activities.  reports some difficulty sleeping.  Dressing better, can't fasten bra in the back   Currently in Pain? Yes   Pain Score 3    Pain Location Wrist  shoulder right side   Pain Relieving Factors the patch helped            [facility] PT Assessment - 03/21/15 0001    AROM   Left Wrist Extension 55 Degrees   Left Wrist Flexion 5 Degrees                     [facility] Adult PT Treatment/Exercise - 03/21/15 0001    Shoulder Exercises: Seated   Other Seated Exercises UBE 2 fwd/2 back   Shoulder Exercises: ROM/Strengthening   "W" Arms 15   Other ROM/Strengthening Exercises 15# row, 15# lats   Other ROM/Strengthening Exercises red tband scap stabilization and ER for shoulders   Wrist Exercises   Bar Weights/Barbell (Forearm Supination) 1 lb   Bar Weights/Barbell (Forearm Pronation) 1 lb   Wrist Radial Deviation 20 reps  with hammer   Wrist Ulnar Deviation 20 reps  with hammer   Other wrist exercises ball bounce on trampolinr for grip and sup/pron, 2# flexion and extension wrist  Other wrist exercises velcro board pronate/supinate x 20;  velcro finger roller   Electrical Stimulation   Electrical Stimulation Location left wrist   Electrical Stimulation Action IFC   Electrical Stimulation Parameters tolerance p   Electrical Stimulation Goals Pain   Iontophoresis   Type of Iontophoresis Dexamethasone   Location RT wrist   Dose 1.2   Time 4 hour leave on patch   Manual Therapy   Manual Therapy Passive ROM   Passive ROM PROM of the left wrist and forearm                  PT Short Term Goals - 03/21/15 1150    PT SHORT TERM GOAL #1   Title independent with initial HEP   Status Achieved           PT Long Term Goals - 03/21/15 1150    PT LONG TERM GOAL #1   Title increase AROM of the left wrist to 70 degrees flexion and extension   Status On-going   PT LONG TERM GOAL #2   Title decrease pain 50% with activity   Status On-going                Plan - 03/21/15 1148    Clinical Impression Statement Reports that the patch seemed to help.  ROM is improving greatly, still difficulty with bra and with strength as well as end range wrist motions   PT Treatment/Interventions Iontophoresis 4mg /ml Dexamethasone   PT Next Visit Plan continue to add ROM and strength activities   Consulted and Agree with Plan of Care Patient        Problem List Patient Active Problem List   Diagnosis Date Noted  . Sinusitis, chronic 11/25/2012  . Cough variant asthma 11/25/2012  . Cough 11/24/2012    Sumner Boast., PT 03/21/2015, 11:52 AM  [place] [address] [place], [place], [address] Phone: [phone]   Fax:  [phone]

## 2015-03-24 ENCOUNTER — Ambulatory Visit: Payer: Medicare Other | Attending: Orthopaedic Surgery | Admitting: Physical Therapy

## 2015-03-24 ENCOUNTER — Encounter: Payer: Self-pay | Admitting: Physical Therapy

## 2015-03-24 DIAGNOSIS — M25632 Stiffness of left wrist, not elsewhere classified: Secondary | ICD-10-CM | POA: Diagnosis present

## 2015-03-24 DIAGNOSIS — M25532 Pain in left wrist: Secondary | ICD-10-CM

## 2015-03-24 DIAGNOSIS — M25511 Pain in right shoulder: Secondary | ICD-10-CM | POA: Insufficient documentation

## 2015-03-24 NOTE — Therapy (Signed)
[place], [place], [address] Phone: [phone]   Fax:  [phone]  Physical Therapy Treatment  Patient Details  Name: Cassandra Sims MRN: [id number] Date of Birth: Jul 21, 1933 Referring Provider:  Melrose Nakayama, MD  Encounter Date: 03/24/2015      PT End of Session - 03/24/15 0914    PT Start Time 0848   PT Stop Time 0940   PT Time Calculation (min) 52 min      Past Medical History  Diagnosis Date  . Hypertension   . Arthritis   . GERD (gastroesophageal reflux disease)   . Asthma   . HOH (hard of hearing)   . Wears glasses   . Glaucoma   . Multiple allergies   . Rhinitis   . Complication of anesthesia     hard to wake up  . PONV (postoperative nausea and vomiting)   . Peripheral vascular disease     varicose veins     Past Surgical History  Procedure Laterality Date  . Tonsillectomy    . Appendectomy    . Abdominal hysterectomy    . Functional endoscopic sinus surgery  2009    bilat ethm,frontal,max  . Colectomy  2008    obstruction  . Ureter surgery      cut accidentally during exp lap-ovarien cyst  . Dilation and curettage of uterus    . Tubal ligation    . Diagnostic laparoscopy      exp  . Ovarian cyst surgery    . Nasal sinus surgery Bilateral 10/26/2012    Procedure: BILATERAL ENDOSCOPIC REVISION ETHMOID MAXILLARY AND FRONTAL SINUS SURGERY  ;  Surgeon: Izora Gala, MD;  Location: [place];  Service: ENT;  Laterality: Bilateral;    There were no vitals filed for this visit.  Visit Diagnosis:  Left wrist pain  Right shoulder pain  Wrist stiffness, left      Subjective Assessment - 03/24/15 0849    Subjective RT wrist 95% better, Left 50% better. Still pain in left wrist   Currently in Pain? Yes   Pain Score 3    Pain Location Wrist   Pain Orientation Left;Right                         [facility] Adult PT Treatment/Exercise  - 03/24/15 0001    Shoulder Exercises: Standing   Other Standing Exercises velcro board   Shoulder Exercises: ROM/Strengthening   UBE (Upper Arm Bike) 3 fwd/3 back L 3   Other ROM/Strengthening Exercises 15# row, 15# lats  2 sets 15   Wrist Exercises   Bar Weights/Barbell (Forearm Supination) 2 lbs   Bar Weights/Barbell (Forearm Pronation) 2 lbs   Wrist Radial Deviation 20 reps  with hammer   Wrist Ulnar Deviation 20 reps  with hammer   Other wrist exercises standing sup/pron 4# 20 times  bilateral wrist ext and flex 4# 15 times   Moist Heat Therapy   Number Minutes Moist Heat 15 Minutes   Moist Heat Location Hand   Electrical Stimulation   Electrical Stimulation Location left wrist   Electrical Stimulation Action IFC   Electrical Stimulation Goals Pain   Iontophoresis   Type of Iontophoresis Dexamethasone   Location RT wrist   Dose --  added left wrist too today   Time 4 hour leave on patch   Manual Therapy   Manual Therapy Passive ROM  Passive ROM PROM of the left wrist and forearm                  PT Short Term Goals - 03/21/15 1150    PT SHORT TERM GOAL #1   Title independent with initial HEP   Status Achieved           PT Long Term Goals - 03/24/15 0918    PT LONG TERM GOAL #1   Title u               Plan - 03/24/15 0914    Clinical Impression Statement pt with limited flex and supination but all other ROM is improving. decreased pain and increased func. NO IONTO this session d/t pt going MD at 10:30 and getting xrays   PT Next Visit Plan ADD IONTO to LEft wrist next session        Problem List Patient Active Problem List   Diagnosis Date Noted  . Sinusitis, chronic 11/25/2012  . Cough variant asthma 11/25/2012  . Cough 11/24/2012    Marteze Vecchio,ANGIE PTA 03/24/2015, 9:19 AM  [place] [address] [place], [place], [address] Phone: [phone]   Fax:   [phone]

## 2015-03-30 ENCOUNTER — Ambulatory Visit: Payer: Medicare Other | Admitting: Physical Therapy

## 2015-03-30 ENCOUNTER — Encounter: Payer: Self-pay | Admitting: Physical Therapy

## 2015-03-30 DIAGNOSIS — M25532 Pain in left wrist: Secondary | ICD-10-CM

## 2015-03-30 DIAGNOSIS — M25511 Pain in right shoulder: Secondary | ICD-10-CM

## 2015-03-30 DIAGNOSIS — M25632 Stiffness of left wrist, not elsewhere classified: Secondary | ICD-10-CM

## 2015-03-30 NOTE — Therapy (Signed)
[place], [place], [address] Phone: [phone]   Fax:  [phone]  Physical Therapy Treatment  Patient Details  Name: Cassandra Sims MRN: [id number] Date of Birth: [birth year] Referring Provider:  Melrose Nakayama, MD  Encounter Date: 03/30/2015      PT End of Session - 03/30/15 0926    Visit Number 6   Date for PT Re-Evaluation 05/07/15   PT Start Time 0848   PT Stop Time 0930   PT Time Calculation (min) 42 min   Activity Tolerance Patient tolerated treatment well   Behavior During Therapy [facility] for tasks assessed/performed      Past Medical History  Diagnosis Date  . Hypertension   . Arthritis   . GERD (gastroesophageal reflux disease)   . Asthma   . HOH (hard of hearing)   . Wears glasses   . Glaucoma   . Multiple allergies   . Rhinitis   . Complication of anesthesia     hard to wake up  . PONV (postoperative nausea and vomiting)   . Peripheral vascular disease     varicose veins     Past Surgical History  Procedure Laterality Date  . Tonsillectomy    . Appendectomy    . Abdominal hysterectomy    . Functional endoscopic sinus surgery  2009    bilat ethm,frontal,max  . Colectomy  2008    obstruction  . Ureter surgery      cut accidentally during exp lap-ovarien cyst  . Dilation and curettage of uterus    . Tubal ligation    . Diagnostic laparoscopy      exp  . Ovarian cyst surgery    . Nasal sinus surgery Bilateral 10/26/2012    Procedure: BILATERAL ENDOSCOPIC REVISION ETHMOID MAXILLARY AND FRONTAL SINUS SURGERY  ;  Surgeon: Izora Gala, MD;  Location: [place];  Service: ENT;  Laterality: Bilateral;    There were no vitals filed for this visit.  Visit Diagnosis:  Left wrist pain  Right shoulder pain  Wrist stiffness, left      Subjective Assessment - 03/30/15 0852    Subjective Got a shot of cortisone in the left hand last week at [facility].  I am  doing better just pain in the left wrist mostly with supination   Currently in Pain? Yes   Pain Score 4    Pain Location Wrist   Pain Orientation Left   Pain Descriptors / Indicators Sharp   Pain Type Acute pain   Aggravating Factors  any left wrist twisting activity            [facility] PT Assessment - 03/30/15 0001    AROM   Left Wrist Extension 70 Degrees   Left Wrist Flexion 50 Degrees                     [facility] Adult PT Treatment/Exercise - 03/30/15 0001    Elbow Exercises   Wrist Extension Limitations 2# wrist extension, flexion, hammer RD, UD and pro/sup, velcro board, eggercizer for grips strength, Lats 15#, seated row 15#, also did fine motor coordination exercisee   Shoulder Exercises: Seated   Other Seated Exercises UBE 2 fwd/2 back   Wrist Exercises   Other wrist exercises weighted ball bouncing   Manual Therapy   Manual Therapy Passive ROM   Passive ROM PROM of the left wrist and forearm  PT Short Term Goals - 03/21/15 1150    PT SHORT TERM GOAL #1   Title independent with initial HEP   Status Achieved           PT Long Term Goals - 03/24/15 0918    PT LONG TERM GOAL #1   Title u               Plan - 03/30/15 3810    Clinical Impression Statement Patient overall doing much better, pain at end range left wrist motions.  Left wrist is the biggest issue at this time.   PT Next Visit Plan tried to do without any modalities today.   Consulted and Agree with Plan of Care Patient        Problem List Patient Active Problem List   Diagnosis Date Noted  . Sinusitis, chronic 11/25/2012  . Cough variant asthma 11/25/2012  . Cough 11/24/2012    Sumner Boast., PT 03/30/2015, 9:31 AM  [address] [place], [place], [address] Phone: [phone]   Fax:  [phone]

## 2015-04-07 ENCOUNTER — Encounter: Payer: Self-pay | Admitting: Physical Therapy

## 2015-04-07 ENCOUNTER — Ambulatory Visit: Payer: Medicare Other | Admitting: Physical Therapy

## 2015-04-07 DIAGNOSIS — M25532 Pain in left wrist: Secondary | ICD-10-CM | POA: Diagnosis not present

## 2015-04-07 DIAGNOSIS — M25632 Stiffness of left wrist, not elsewhere classified: Secondary | ICD-10-CM

## 2015-04-07 DIAGNOSIS — M25511 Pain in right shoulder: Secondary | ICD-10-CM

## 2015-04-07 NOTE — Therapy (Signed)
[place], [place], [address] Phone: [phone]   Fax:  [phone]  Physical Therapy Treatment  Patient Details  Name: Cassandra Sims MRN: [id number] Date of Birth: 14-May-1933 Referring Provider:  Melrose Nakayama, MD  Encounter Date: 04/07/2015      PT End of Session - 04/07/15 0922    PT Start Time 0849   PT Stop Time 0946   PT Time Calculation (min) 57 min   Activity Tolerance Patient limited by pain   Behavior During Therapy [facility] for tasks assessed/performed      Past Medical History  Diagnosis Date  . Hypertension   . Arthritis   . GERD (gastroesophageal reflux disease)   . Asthma   . HOH (hard of hearing)   . Wears glasses   . Glaucoma   . Multiple allergies   . Rhinitis   . Complication of anesthesia     hard to wake up  . PONV (postoperative nausea and vomiting)   . Peripheral vascular disease     varicose veins     Past Surgical History  Procedure Laterality Date  . Tonsillectomy    . Appendectomy    . Abdominal hysterectomy    . Functional endoscopic sinus surgery  2009    bilat ethm,frontal,max  . Colectomy  2008    obstruction  . Ureter surgery      cut accidentally during exp lap-ovarien cyst  . Dilation and curettage of uterus    . Tubal ligation    . Diagnostic laparoscopy      exp  . Ovarian cyst surgery    . Nasal sinus surgery Bilateral 10/26/2012    Procedure: BILATERAL ENDOSCOPIC REVISION ETHMOID MAXILLARY AND FRONTAL SINUS SURGERY  ;  Surgeon: Izora Gala, MD;  Location: [place];  Service: ENT;  Laterality: Bilateral;    There were no vitals filed for this visit.  Visit Diagnosis:  Left wrist pain  Right shoulder pain  Wrist stiffness, left      Subjective Assessment - 04/07/15 0853    Subjective no pain at rest, pain with lifting things and with opening jars   Currently in Pain? No/denies                          [facility] Adult PT Treatment/Exercise - 04/07/15 0001    Elbow Exercises   Wrist Extension Limitations 2# wrist extension and flexion, hammer UD/RD, pronation and supination, lat pulls 15#, seated row 15#, eggsercizer and fine motor coordination   Shoulder Exercises: Seated   Other Seated Exercises UBE 2 fwd/2 back, level 4   Shoulder Exercises: Standing   Other Standing Exercises velcro board   Shoulder Exercises: ROM/Strengthening   "W" Arms 15   Other ROM/Strengthening Exercises wall pushups   Wrist Exercises   Other wrist exercises weighted ball bouncing   LUE Paraffin   LUE Paraffin Location Hand;Wrist   Manual Therapy   Manual Therapy Passive ROM   Joint Mobilization ulna and radius on the carpals    Passive ROM PROM of the left wrist and forearm                  PT Short Term Goals - 03/21/15 1150    PT SHORT TERM GOAL #1   Title independent with initial HEP   Status Achieved           PT Long Term  Goals - 04/07/15 0924    PT LONG TERM GOAL #1   Title independent with advanced HEP   Status Achieved   PT LONG TERM GOAL #2   Title decrease pain 50% with activity   Status Partially Met   PT LONG TERM GOAL #3   Title increase left forarm supination to WNL's   Status Partially Met   PT LONG TERM GOAL #4   Title increase left grip strength to 30 #   Status Partially Met               Plan - 04/07/15 0923    Clinical Impression Statement left hand is the only one that is hurting her right now , and with lifting and opening jars, right wrist feeling very good.     PT Next Visit Plan work on the left wrist, with joint mobs   Consulted and Agree with Plan of Care Patient        Problem List Patient Active Problem List   Diagnosis Date Noted  . Sinusitis, chronic 11/25/2012  . Cough variant asthma 11/25/2012  . Cough 11/24/2012    Sumner Boast., PT 04/07/2015, 9:25 AM  [address] [place], [place], [address] Phone: [phone]   Fax:  [phone]

## 2015-04-18 ENCOUNTER — Ambulatory Visit: Payer: Medicare Other | Admitting: Physical Therapy

## 2015-04-18 ENCOUNTER — Encounter: Payer: Self-pay | Admitting: Physical Therapy

## 2015-04-18 DIAGNOSIS — M25532 Pain in left wrist: Secondary | ICD-10-CM | POA: Diagnosis not present

## 2015-04-18 NOTE — Therapy (Signed)
[place], [place], [address] Phone: [phone]   Fax:  [phone]  Physical Therapy Treatment  Patient Details  Name: Cassandra Sims MRN: [id number] Date of Birth: 1932/11/10 Referring Provider:  Melrose Nakayama, MD  Encounter Date: 04/18/2015      PT End of Session - 04/18/15 1516    Visit Number 7   Date for PT Re-Evaluation 05/07/15   PT Start Time 2119   PT Stop Time 1530   PT Time Calculation (min) 45 min      Past Medical History  Diagnosis Date  . Hypertension   . Arthritis   . GERD (gastroesophageal reflux disease)   . Asthma   . HOH (hard of hearing)   . Wears glasses   . Glaucoma   . Multiple allergies   . Rhinitis   . Complication of anesthesia     hard to wake up  . PONV (postoperative nausea and vomiting)   . Peripheral vascular disease     varicose veins     Past Surgical History  Procedure Laterality Date  . Tonsillectomy    . Appendectomy    . Abdominal hysterectomy    . Functional endoscopic sinus surgery  2009    bilat ethm,frontal,max  . Colectomy  2008    obstruction  . Ureter surgery      cut accidentally during exp lap-ovarien cyst  . Dilation and curettage of uterus    . Tubal ligation    . Diagnostic laparoscopy      exp  . Ovarian cyst surgery    . Nasal sinus surgery Bilateral 10/26/2012    Procedure: BILATERAL ENDOSCOPIC REVISION ETHMOID MAXILLARY AND FRONTAL SINUS SURGERY  ;  Surgeon: Izora Gala, MD;  Location: [place];  Service: ENT;  Laterality: Bilateral;    There were no vitals filed for this visit.  Visit Diagnosis:  Left wrist pain      Subjective Assessment - 04/18/15 1446    Subjective overdid this weekend and it really bothered wrist   Currently in Pain? Yes   Pain Score 4    Pain Location Wrist   Pain Orientation Left                         [facility] Adult PT Treatment/Exercise - 04/18/15  0001    Shoulder Exercises: Seated   Other Seated Exercises UBE 3 fwd/3back, level 4   Wrist Exercises   Other wrist exercises velcro board, trampoline toss   Other wrist exercises sup/pronation 4 ways   LUE Paraffin   LUE Paraffin Location Hand;Wrist   Manual Therapy   Manual Therapy Joint mobilization;Passive ROM   Passive ROM ulnar popping 3 times with jt mobs, relief after                PT Education - 04/18/15 1515    Education provided Yes   Education Details wrist wrap/brace for increased activity   Person(s) Educated Patient   Methods Demonstration;Explanation;Handout   Comprehension Verbalized understanding          PT Short Term Goals - 03/21/15 1150    PT SHORT TERM GOAL #1   Title independent with initial HEP   Status Achieved           PT Long Term Goals - 04/07/15 4174    PT LONG TERM GOAL #1   Title independent with advanced HEP  Status Achieved   PT LONG TERM GOAL #2   Title decrease pain 50% with activity   Status Partially Met   PT LONG TERM GOAL #3   Title increase left forarm supination to WNL's   Status Partially Met   PT LONG TERM GOAL #4   Title increase left grip strength to 30 #   Status Partially Met               Plan - 04/18/15 1517    Clinical Impression Statement tenderness left lateral wrist,popping 3 times on ulna. decreased pain and increased ROM after. Discussed using wrist brace/wrap with increased activity.   PT Next Visit Plan work on the left wrist, with joint mobs        Problem List Patient Active Problem List   Diagnosis Date Noted  . Sinusitis, chronic 11/25/2012  . Cough variant asthma 11/25/2012  . Cough 11/24/2012    PAYSEUR,ANGIE PTA 04/18/2015, 3:21 PM  [place] [address] [place], [place], [address] Phone: [phone]   Fax:  [phone]

## 2015-04-25 ENCOUNTER — Ambulatory Visit: Payer: Medicare Other | Attending: Orthopaedic Surgery | Admitting: Physical Therapy

## 2015-04-25 DIAGNOSIS — M25532 Pain in left wrist: Secondary | ICD-10-CM | POA: Insufficient documentation

## 2015-04-25 DIAGNOSIS — M25511 Pain in right shoulder: Secondary | ICD-10-CM | POA: Insufficient documentation

## 2015-04-25 DIAGNOSIS — M25632 Stiffness of left wrist, not elsewhere classified: Secondary | ICD-10-CM | POA: Diagnosis present

## 2015-04-25 NOTE — Therapy (Signed)
[place], [place], [address] Phone: [phone]   Fax:  [phone]  Physical Therapy Treatment  Patient Details  Name: Cassandra Sims MRN: [id number] Date of Birth: 10/05/1932 Referring Provider:  Melrose Nakayama, MD  Encounter Date: 04/25/2015      PT End of Session - 04/25/15 1216    Visit Number 8   Date for PT Re-Evaluation 05/07/15   PT Start Time 5726   PT Stop Time 1215   PT Time Calculation (min) 30 min      Past Medical History  Diagnosis Date  . Hypertension   . Arthritis   . GERD (gastroesophageal reflux disease)   . Asthma   . HOH (hard of hearing)   . Wears glasses   . Glaucoma   . Multiple allergies   . Rhinitis   . Complication of anesthesia     hard to wake up  . PONV (postoperative nausea and vomiting)   . Peripheral vascular disease     varicose veins     Past Surgical History  Procedure Laterality Date  . Tonsillectomy    . Appendectomy    . Abdominal hysterectomy    . Functional endoscopic sinus surgery  2009    bilat ethm,frontal,max  . Colectomy  2008    obstruction  . Ureter surgery      cut accidentally during exp lap-ovarien cyst  . Dilation and curettage of uterus    . Tubal ligation    . Diagnostic laparoscopy      exp  . Ovarian cyst surgery    . Nasal sinus surgery Bilateral 10/26/2012    Procedure: BILATERAL ENDOSCOPIC REVISION ETHMOID MAXILLARY AND FRONTAL SINUS SURGERY  ;  Surgeon: Izora Gala, MD;  Location: [facility];  Service: ENT;  Laterality: Bilateral;    There were no vitals filed for this visit.  Visit Diagnosis:  Left wrist pain  Wrist stiffness, left      Subjective Assessment - 04/25/15 1213    Subjective saw MD and he said wrist is all healed just arthritis now   Currently in Pain? Yes   Pain Score 4    Pain Location Wrist   Pain Orientation Left;Lateral                          [facility] Adult PT Treatment/Exercise - 04/25/15 0001    Modalities   Modalities Ultrasound   Ultrasound   Ultrasound Location Left lateral wrist   Ultrasound Parameters 64mZ  1 w/cm 2   Ultrasound Goals Pain   Iontophoresis   Type of Iontophoresis Dexamethasone   Location Left wrist   Time 4 hour leave on patch   Manual Therapy   Manual Therapy Joint mobilization;Passive ROM  left wrist                PT Education - 04/25/15 1215    Education provided Yes   Education Details parrafin at home and/or MH, gentle ROM/stretch, brace with increased activity   Person(s) Educated Patient   Methods Explanation   Comprehension Verbalized understanding;Returned demonstration          PT Short Term Goals - 03/21/15 1150    PT SHORT TERM GOAL #1   Title independent with initial HEP   Status Achieved           PT Long Term Goals - 04/25/15 1217  PT LONG TERM GOAL #1   Title independent with advanced HEP   Status Achieved   PT LONG TERM GOAL #2   Title decrease pain 50% with activity   Status Achieved   PT LONG TERM GOAL #3   Title increase left forarm supination to WNL's   Status Achieved   PT LONG TERM GOAL #4   Title increase left grip strength to 30 #               Plan - 04/25/15 1219    Clinical Impression Statement goals met except grip strength d/t pain,pt educated and able to verb good understanding of arthritis mangement at home.    PT Next Visit Plan ionto,check grip and D/C if no new symptoms arise        Problem List Patient Active Problem List   Diagnosis Date Noted  . Sinusitis, chronic 11/25/2012  . Cough variant asthma 11/25/2012  . Cough 11/24/2012    Cricket Goodlin,ANGIE PTA 04/25/2015, 12:21 PM  [place] [address] [place], [place], [address] Phone: [phone]   Fax:  [phone]

## 2015-05-04 ENCOUNTER — Encounter: Payer: Self-pay | Admitting: Physical Therapy

## 2015-05-04 ENCOUNTER — Ambulatory Visit: Payer: Medicare Other | Admitting: Physical Therapy

## 2015-05-04 DIAGNOSIS — M25511 Pain in right shoulder: Secondary | ICD-10-CM

## 2015-05-04 DIAGNOSIS — M25532 Pain in left wrist: Secondary | ICD-10-CM

## 2015-05-04 DIAGNOSIS — M25632 Stiffness of left wrist, not elsewhere classified: Secondary | ICD-10-CM

## 2015-05-04 NOTE — Therapy (Signed)
[place] [address] [place], [place], [address] Phone: [phone]   Fax:  [phone]  Physical Therapy Treatment  Patient Details  Name: Cassandra Sims MRN: [id number] Date of Birth: Jan 10, 1933 Referring Provider:  Melrose Nakayama, MD  Encounter Date: 05/04/2015      PT End of Session - 05/04/15 0959    Visit Number 9   Date for PT Re-Evaluation 05/07/15   PT Start Time 0930   PT Stop Time 6144   PT Time Calculation (min) 44 min   Activity Tolerance Patient limited by pain   Behavior During Therapy [facility] for tasks assessed/performed      Past Medical History  Diagnosis Date  . Hypertension   . Arthritis   . GERD (gastroesophageal reflux disease)   . Asthma   . HOH (hard of hearing)   . Wears glasses   . Glaucoma   . Multiple allergies   . Rhinitis   . Complication of anesthesia     hard to wake up  . PONV (postoperative nausea and vomiting)   . Peripheral vascular disease ([place])     varicose veins     Past Surgical History  Procedure Laterality Date  . Tonsillectomy    . Appendectomy    . Abdominal hysterectomy    . Functional endoscopic sinus surgery  2009    bilat ethm,frontal,max  . Colectomy  2008    obstruction  . Ureter surgery      cut accidentally during exp lap-ovarien cyst  . Dilation and curettage of uterus    . Tubal ligation    . Diagnostic laparoscopy      exp  . Ovarian cyst surgery    . Nasal sinus surgery Bilateral 10/26/2012    Procedure: BILATERAL ENDOSCOPIC REVISION ETHMOID MAXILLARY AND FRONTAL SINUS SURGERY  ;  Surgeon: Izora Gala, MD;  Location: [place];  Service: ENT;  Laterality: Bilateral;    There were no vitals filed for this visit.  Visit Diagnosis:  Left wrist pain  Wrist stiffness, left  Right shoulder pain      Subjective Assessment - 05/04/15 0953    Subjective Patient reports that at times she cannot lift a pan due to the wrist  giving out.  She does report that she is not doing the exercises at home   Currently in Pain? Yes   Pain Score 1    Pain Location Wrist   Pain Orientation Left;Lateral   Pain Descriptors / Indicators Sharp   Pain Type Acute pain   Pain Onset 1 to 4 weeks ago   Aggravating Factors  lifting pans   Pain Relieving Factors treatment                         [facility] Adult PT Treatment/Exercise - 05/04/15 0001    Elbow Exercises   Wrist Extension Limitations 3# wrist flexion/extension, UD/RD, pro/supination   Shoulder Exercises: Supine   Other Supine Exercises went over wrist stretches to do at home   Shoulder Exercises: Seated   Other Seated Exercises UBE 3 fwd/3back, level 4   Shoulder Exercises: Standing   Other Standing Exercises velcro board   Shoulder Exercises: ROM/Strengthening   Other ROM/Strengthening Exercises wall pushups   Iontophoresis   Type of Iontophoresis Dexamethasone   Location Left wrist   Dose 84m   Time 4 hour leave on patch   LUE Paraffin   LUE  Paraffin Location Hand;Wrist   Manual Therapy   Manual Therapy Joint mobilization;Passive ROM   Joint Mobilization ulna and radius on the carpals    Passive ROM ulnar popping 3 times with jt mobs, relief after                PT Education - May 30, 2015 0958    Education provided Yes   Education Details went over all HEP for wrist and shouldert stretching, flexibility and strength, patient needed cues to perform   Person(s) Educated Patient   Methods Explanation;Demonstration;Tactile cues;Verbal cues   Comprehension Verbalized understanding          PT Short Term Goals - 03/21/15 1150    PT SHORT TERM GOAL #1   Title independent with initial HEP   Status Achieved           PT Long Term Goals - 05-30-2015 1000    PT LONG TERM GOAL #4   Title increase left grip strength to 30 #   Status Achieved               Plan - May 30, 2015 0959    Clinical Impression Statement Still with  pain carrying pans, mostly on the left radial carpal area, she is not exercising at home and I have asked her to really focus on this   PT Next Visit Plan d/c   Consulted and Agree with Plan of Care Patient          G-Codes - 05/30/2015 1002    Functional Assessment Tool Used foto   Functional Limitation Other PT primary   Other PT Primary Current Status (Q9826) At least 40 percent but less than 60 percent impaired, limited or restricted   Other PT Primary Goal Status (E1583) At least 40 percent but less than 60 percent impaired, limited or restricted   Other PT Primary Discharge Status (E9407) At least 40 percent but less than 60 percent impaired, limited or restricted      Problem List Patient Active Problem List   Diagnosis Date Noted  . Sinusitis, chronic 11/25/2012  . Cough variant asthma 11/25/2012  . Cough 11/24/2012   PHYSICAL THERAPY DISCHARGE SUMMARY   Plan: Patient agrees to discharge.  Patient goals were met. Patient is being discharged due to meeting the stated rehab goals.  ?????       Sumner Boast., PT 30-May-2015, 10:07 AM  [address] [place], [place], [address] Phone: [phone]   Fax:  [phone]

## 2015-06-23 DIAGNOSIS — N3281 Overactive bladder: Secondary | ICD-10-CM | POA: Insufficient documentation

## 2015-08-04 DIAGNOSIS — H401113 Primary open-angle glaucoma, right eye, severe stage: Secondary | ICD-10-CM | POA: Diagnosis not present

## 2015-08-04 DIAGNOSIS — H04121 Dry eye syndrome of right lacrimal gland: Secondary | ICD-10-CM | POA: Diagnosis not present

## 2015-08-21 DIAGNOSIS — H401113 Primary open-angle glaucoma, right eye, severe stage: Secondary | ICD-10-CM | POA: Diagnosis not present

## 2015-08-21 DIAGNOSIS — H401122 Primary open-angle glaucoma, left eye, moderate stage: Secondary | ICD-10-CM | POA: Diagnosis not present

## 2015-09-19 ENCOUNTER — Encounter: Payer: Self-pay | Admitting: Adult Health

## 2015-09-19 ENCOUNTER — Ambulatory Visit (INDEPENDENT_AMBULATORY_CARE_PROVIDER_SITE_OTHER)
Admission: RE | Admit: 2015-09-19 | Discharge: 2015-09-19 | Disposition: A | Discharge status: Home / Self Care | Payer: Medicare Other | Source: Ambulatory Visit | Attending: Adult Health | Admitting: Adult Health

## 2015-09-19 ENCOUNTER — Ambulatory Visit (INDEPENDENT_AMBULATORY_CARE_PROVIDER_SITE_OTHER): Payer: Medicare Other | Admitting: Adult Health

## 2015-09-19 VITALS — BP 138/76 | HR 66 | Temp 98.0°F | Ht 62.0 in | Wt 119.0 lb

## 2015-09-19 DIAGNOSIS — J45991 Cough variant asthma: Secondary | ICD-10-CM | POA: Diagnosis not present

## 2015-09-19 DIAGNOSIS — R059 Cough, unspecified: Secondary | ICD-10-CM

## 2015-09-19 DIAGNOSIS — R05 Cough: Secondary | ICD-10-CM | POA: Diagnosis not present

## 2015-09-19 MED ORDER — PREDNISONE 10 MG PO TABS: ORAL_TABLET | ORAL | Status: DC

## 2015-09-19 MED ORDER — AZITHROMYCIN 250 MG PO TABS: ORAL_TABLET | ORAL | Status: AC

## 2015-09-19 NOTE — Patient Instructions (Signed)
Zpack take as directed.  Prednisone taper over next week  Mucinex DM Twice daily As needed   Chest xray today .  Follow up in 3-4 weeks and As needed   Please contact office for sooner follow up if symptoms do not improve or worsen or seek emergency care

## 2015-09-19 NOTE — Progress Notes (Signed)
Subjective:    Patient ID: Cassandra Sims, female    DOB: 05-28-1933   MRN: [id number]   Brief patient profile:  6 yowf never smoker with chronic rhinitis/sinusitis and recurrent cough x around 2004 and wheeze rx with advair chronically self referred 11/24/2012 to pulmonary clinic for eval of "chest congestion"   History of Present Illness  11/24/2012 1st pulmonary ov in EMR era cc daily congested cough x "maybe a decade" with minimal yellow mucus better on levaquin  and sob  while maintained  on advair bid with doe x  5-10 min of housework and walking aisles at grocery store  worsex sev months indolent onset, minimally progressive  and ? Better overall since last since last sinus surgery October 26 2012.   rec dulera 100 2bid plus diet   12/10/12 Added ppi/ h2 hs   07/05/2013 acute  ov/Wert re: cough  Chief Complaint  Patient presents with  . Follow-up    Pt c/o increased congestion and cough for the past several wks. Cough is prod with minimal white sputum.   No increase sob over baseline or need for saba  rec zpak and Prednisone 10 mg take  4 each am x 2 days,   2 each am x 2 days,  1 each am x 2 days and stop  See calendar  07/26/2013 f/u ov/Wert re: ? Cough variant asthma  Chief Complaint  Patient presents with  . Follow-up    Pt states cough is much improved since last visit. Still has occ prod cough w/ minimal white sputum.     Doesn't tend to wake up Best response is with prednisone, some better with saba, using up to sev times a day >>Dulera rx   08/27/2013 Follow up and Med Review  Pt returns for follow up and med review  We reviewed all her meds and organized them into a med calendar w/ pt education Appears to be taking her meds correctly.  Doing well until 2 days prior to OV  when she noticed some head congestion, PND, yellow nasal drainage, prod cough with same x 2 days rec Follow medication calendar closely and bring to each visit Z-Pak to have on hold if symptoms do not  improve or worsen with discolored mucus.       11/26/2013 f/u ov/Wert re: prob cough variant asthma  Chief Complaint  Patient presents with  . Follow-up    Pt states that breathing is overall doing well. She c/o CP on and off for the past month- occurs mid am and is sharp and dull.  She also continues to have PND causing her to clear her throat. Has not had to use rescue inhaler since last visit.   cp x one month sev times per day lasts 5 min not related to activity or meals, slt L of midline like a knife and able to get around well though no heavy exertion rec Prednisone 10 mg take  4 each am x 2 days,   2 each am x 2 days,  1 each am x 2 days and stop  dymista 2 puffs each am Work on inhaler technique:   Change zyrtec to 10 mg at bedtime as needed for when itching sneezing is bad    Only use your albuterol (proair)   03/16/2014 f/u ov/Wert re: chronic cough/ no med calendar  Chief Complaint  Patient presents with  . Follow-up    coughing greenish   rarely cough up anything at all  and not disturbing sleep   Not using saba hfa or neb on dulera 100 2bid  >decrease dulera 1 Twice daily    09/19/2015 Follow up : Cough variant asthma  Pt presents for follow up . Complains of chest congestion, prod cough with clear to yellow colored mucus, wheezing at times, sinus drainage/congestion for 2 months Worse for last 2 weeks. Sharla Kidney any chest tightness, fever, nausea or vomiting.  Remains on Dulera and Singulair .   Past Medical History  Diagnosis Date  . Hypertension   . Arthritis   . GERD (gastroesophageal reflux disease)   . Asthma   . HOH (hard of hearing)   . Wears glasses   . Glaucoma   . Multiple allergies   . Rhinitis   . Complication of anesthesia     hard to wake up  . PONV (postoperative nausea and vomiting)   . Peripheral vascular disease ([place])     varicose veins    Current Outpatient Prescriptions on File Prior to Visit  Medication Sig Dispense Refill  .  acetaminophen (TYLENOL) 325 MG tablet Per bottle as needed for pain    . albuterol (PROAIR HFA) 108 (90 BASE) MCG/ACT inhaler Inhale 2 puffs into the lungs every 4 (four) hours as needed for wheezing or shortness of breath. 1 Inhaler 3  . albuterol (PROVENTIL HFA;VENTOLIN HFA) 108 (90 BASE) MCG/ACT inhaler Inhale 2 puffs into the lungs every 4 (four) hours as needed for wheezing.     Marland Kitchen amLODipine (NORVASC) 10 MG tablet Take 10 mg by mouth daily.    . bimatoprost (LUMIGAN) 0.03 % ophthalmic solution Place 1 drop into both eyes at bedtime.    . calcium-vitamin D (OSCAL WITH D) 500-200 MG-UNIT per tablet Take 1 tablet by mouth 2 (two) times daily.     . cholecalciferol (VITAMIN D) 1000 UNITS tablet Take 1,000 Units by mouth daily.    Marland Kitchen lactobacillus acidophilus (BACID) TABS Take 1 tablet by mouth daily.     . Misc Natural Products (LUTEIN 20) CAPS Take 1 capsule by mouth every morning.    . mometasone-formoterol (DULERA) 100-5 MCG/ACT AERO Take 2 puffs first thing in am and then another 2 puffs about 12 hours later. (Patient taking differently: Take 1 puff first thing in am and then another 1 puff about 12 hours later.) 3 Inhaler 3  . Multiple Vitamins-Minerals (CENTRUM SILVER PO) Take 1 tablet by mouth daily.     . psyllium (METAMUCIL) 58.6 % powder 1 tsp every morning    . telmisartan (MICARDIS) 40 MG tablet Take 40 mg by mouth daily.    . cetirizine (ZYRTEC) 10 MG tablet Take 10 mg by mouth at bedtime. Reported on 09/19/2015    . dextromethorphan (DELSYM) 30 MG/5ML liquid Reported on 09/19/2015    . famotidine (PEPCID) 20 MG tablet Take 20 mg by mouth at bedtime. Reported on 09/19/2015    . guaiFENesin (MUCINEX) 600 MG 12 hr tablet Reported on 09/19/2015    . omeprazole (PRILOSEC) 20 MG capsule Take 20 mg by mouth daily before breakfast. Reported on 09/19/2015     No current facility-administered medications on file prior to visit.      Current Medications, Allergies, Complete Past Medical  History, Past Surgical History, Family History, and Social History were reviewed in [employer] record.  ROS  The following are not active complaints unless bolded sore throat, dysphagia, dental problems, itching, sneezing,  nasal congestion or excess/ purulent secretions, ear ache,  fever, chills, sweats, unintended wt loss, classically pleuritic pleuritic or exertional cp, hemoptysis,  orthopnea pnd or leg swelling, presyncope, palpitations, heartburn, abdominal pain, anorexia, nausea, vomiting, diarrhea  or change in bowel or urinary habits, change in stools or urine, dysuria,hematuria,  rash, arthralgias, visual complaints, headache, numbness weakness or ataxia or problems with walking or coordination,  change in mood/affect or memory.                      Objective:   Physical Exam  Elderly amb wf nad   Filed Vitals:   09/19/15 0922  BP: 138/76  Pulse: 66  Temp: 98 F (36.7 C)  TempSrc: Oral  Height: 5\' 2"  (1.575 m)  Weight: 119 lb (53.978 kg)  SpO2: 95%     07/05/13         114  > 118 07/26/2013  >120 08/27/2013 > 11/26/2013 122 >  03/16/2014  118 >119 09/19/15   HEENT: nl dentition, turbinates, and orophanx. Nl external ear canals without cough reflex   NECK :  without JVD/Nodes/TM/ nl carotid upstrokes bilaterally   LUNGS: no acc muscle use,  CTA no wheezing bilaterally without cough on insp or exp maneuvers   CV:  RRR  no s3 or murmur or increase in P2, no edema   ABD:  soft and nontender with nl excursion in the supine position. No bruits or organomegaly, bowel sounds nl  MS:  warm without deformities, calf tenderness, cyanosis or clubbing  SKIN: warm and dry without lesions    NEURO:  alert, approp, no deficits     11/24/13 cxr  Underlying emphysema with evidence of prior granulomatous disease  and areas of bibasilar scarring. No edema or consolidation. There  is stable persistent elevation of the left hemidiaphragm.   Atley Neubert  NP-C  [facility]  09/19/2015      Assessment & Plan:

## 2015-09-19 NOTE — Assessment & Plan Note (Addendum)
Flare with bronchitis  Check cxr   Plan   Zpack take as directed.  Prednisone taper over next week  Mucinex DM Twice daily As needed   Chest xray today .  Follow up in 3-4 weeks and As needed   Please contact office for sooner follow up if symptoms do not improve or worsen or seek emergency care

## 2015-09-21 NOTE — Progress Notes (Signed)
Quick Note:  Called and spoke with the pt. Reviewed results and recs. Pt voiced understanding and had no further. ______

## 2015-09-22 ENCOUNTER — Ambulatory Visit: Payer: Medicare Other | Admitting: Adult Health

## 2015-09-29 ENCOUNTER — Telehealth: Payer: Self-pay | Admitting: Adult Health

## 2015-09-29 NOTE — Telephone Encounter (Signed)
ATC, line rang several times, NA, no VM. WCB

## 2015-10-02 NOTE — Telephone Encounter (Signed)
Pt states that she received a letter from insurance stating that she is needing the last 2 years of her medical history with pulmonary faxed to her insurance. Pt aware that she will need to contact Medical Records - number given - aware that she will have to sign a release for those records. Pt expressed understanding. Nothing further needed.

## 2015-10-02 NOTE — Telephone Encounter (Signed)
[phone] calling back

## 2015-10-02 NOTE — Telephone Encounter (Signed)
ATC, line rang numerous times, NA and no VM WCB

## 2015-10-09 DIAGNOSIS — H401113 Primary open-angle glaucoma, right eye, severe stage: Secondary | ICD-10-CM | POA: Diagnosis not present

## 2015-10-09 DIAGNOSIS — H401122 Primary open-angle glaucoma, left eye, moderate stage: Secondary | ICD-10-CM | POA: Diagnosis not present

## 2015-10-10 ENCOUNTER — Telehealth: Payer: Self-pay | Admitting: Adult Health

## 2015-10-10 ENCOUNTER — Encounter: Payer: Self-pay | Admitting: Adult Health

## 2015-10-10 ENCOUNTER — Ambulatory Visit (INDEPENDENT_AMBULATORY_CARE_PROVIDER_SITE_OTHER): Payer: Medicare Other | Admitting: Adult Health

## 2015-10-10 VITALS — BP 130/76 | HR 65 | Temp 98.1°F | Ht 62.0 in | Wt 122.0 lb

## 2015-10-10 DIAGNOSIS — J45991 Cough variant asthma: Secondary | ICD-10-CM | POA: Diagnosis not present

## 2015-10-10 NOTE — Assessment & Plan Note (Signed)
Recent flare now resolving   Plan  Continue on current regimen .  follow up Dr. Melvyn Novas  In 6 months and As needed

## 2015-10-10 NOTE — Telephone Encounter (Signed)
Pt was seen in office today by TP. Pt is requesting to switch providers from [facility] to [facility]. Stating that she feels as if she was being "raked over the coals" at the last visit with [facility]. I explained to her that we would need to send a message to [facility] before switching providers. She voiced understanding and had no further questions. Pt will need a 6 month follow up appointment made  [facility] please advise if okay

## 2015-10-10 NOTE — Patient Instructions (Signed)
Continue on current regimen .  follow up Dr. Melvyn Novas  In 6 months and As needed

## 2015-10-10 NOTE — Progress Notes (Signed)
Subjective:    Patient ID: Cassandra Sims, female    DOB: 05-28-1933   MRN: [id number]   Brief patient profile:  6 yowf never smoker with chronic rhinitis/sinusitis and recurrent cough x around 2004 and wheeze rx with advair chronically self referred 11/24/2012 to pulmonary clinic for eval of "chest congestion"   History of Present Illness  11/24/2012 1st pulmonary ov in EMR era cc daily congested cough x "maybe a decade" with minimal yellow mucus better on levaquin  and sob  while maintained  on advair bid with doe x  5-10 min of housework and walking aisles at grocery store  worsex sev months indolent onset, minimally progressive  and ? Better overall since last since last sinus surgery October 26 2012.   rec dulera 100 2bid plus diet   12/10/12 Added ppi/ h2 hs   07/05/2013 acute  ov/Wert re: cough  Chief Complaint  Patient presents with  . Follow-up    Pt c/o increased congestion and cough for the past several wks. Cough is prod with minimal white sputum.   No increase sob over baseline or need for saba  rec zpak and Prednisone 10 mg take  4 each am x 2 days,   2 each am x 2 days,  1 each am x 2 days and stop  See calendar  07/26/2013 f/u ov/Wert re: ? Cough variant asthma  Chief Complaint  Patient presents with  . Follow-up    Pt states cough is much improved since last visit. Still has occ prod cough w/ minimal white sputum.     Doesn't tend to wake up Best response is with prednisone, some better with saba, using up to sev times a day >>Dulera rx   08/27/2013 Follow up and Med Review  Pt returns for follow up and med review  We reviewed all her meds and organized them into a med calendar w/ pt education Appears to be taking her meds correctly.  Doing well until 2 days prior to OV  when she noticed some head congestion, PND, yellow nasal drainage, prod cough with same x 2 days rec Follow medication calendar closely and bring to each visit Z-Pak to have on hold if symptoms do not  improve or worsen with discolored mucus.       11/26/2013 f/u ov/Wert re: prob cough variant asthma  Chief Complaint  Patient presents with  . Follow-up    Pt states that breathing is overall doing well. She c/o CP on and off for the past month- occurs mid am and is sharp and dull.  She also continues to have PND causing her to clear her throat. Has not had to use rescue inhaler since last visit.   cp x one month sev times per day lasts 5 min not related to activity or meals, slt L of midline like a knife and able to get around well though no heavy exertion rec Prednisone 10 mg take  4 each am x 2 days,   2 each am x 2 days,  1 each am x 2 days and stop  dymista 2 puffs each am Work on inhaler technique:   Change zyrtec to 10 mg at bedtime as needed for when itching sneezing is bad    Only use your albuterol (proair)   03/16/2014 f/u ov/Wert re: chronic cough/ no med calendar  Chief Complaint  Patient presents with  . Follow-up    coughing greenish   rarely cough up anything at all  and not disturbing sleep   Not using saba hfa or neb on dulera 100 2bid  >decrease dulera 1 Twice daily    09/19/2015 Follow up : Cough variant asthma  Pt presents for follow up . Complains of chest congestion, prod cough with clear to yellow colored mucus, wheezing at times, sinus drainage/congestion for 2 months Worse for last 2 weeks. Sharla Kidney any chest tightness, fever, nausea or vomiting.  Remains on Dulera and Singulair .  >zpack and pred taper   10/10/2015 Follow up : Cough variant Asthma  Pt returns for follow up . Seen last ov with URI/Asthma flare  She was treated with Zpack and pred taper.  She is feeling better. Has some drainage and lingering dry cough -but better.  Denies chest pain, orthpnea or edema.     Current Medications, Allergies, Complete Past Medical History, Past Surgical History, Family History, and Social History were reviewed in [employer]  record.  ROS  The following are not active complaints unless bolded sore throat, dysphagia, dental problems, itching, sneezing,  nasal congestion or excess/ purulent secretions, ear ache,   fever, chills, sweats, unintended wt loss, classically pleuritic pleuritic or exertional cp, hemoptysis,  orthopnea pnd or leg swelling, presyncope, palpitations, heartburn, abdominal pain, anorexia, nausea, vomiting, diarrhea  or change in bowel or urinary habits, change in stools or urine, dysuria,hematuria,  rash, arthralgias, visual complaints, headache, numbness weakness or ataxia or problems with walking or coordination,  change in mood/affect or memory.                      Objective:   Physical Exam  Elderly amb wf nad  Filed Vitals:   10/10/15 1041  BP: 130/76  Pulse: 65  Temp: 98.1 F (36.7 C)  TempSrc: Oral  Height: 5\' 2"  (1.575 m)  Weight: 122 lb (55.339 kg)  SpO2: 96%        07/05/13         114  > 118 07/26/2013  >120 08/27/2013 > 11/26/2013 122 >  03/16/2014  118 >119 09/19/15   HEENT: nl dentition, turbinates, and orophanx. Nl external ear canals without cough reflex   NECK :  without JVD/Nodes/TM/ nl carotid upstrokes bilaterally   LUNGS: no acc muscle use,  CTA no wheezing bilaterally without cough on insp or exp maneuvers   CV:  RRR  no s3 or murmur or increase in P2, no edema   ABD:  soft and nontender with nl excursion in the supine position. No bruits or organomegaly, bowel sounds nl  MS:  warm without deformities, calf tenderness, cyanosis or clubbing  SKIN: warm and dry without lesions    NEURO:  alert, approp, no deficits    CXR 09/19/15  Stable elevation of the left hemidiaphragm with left base atelectasis or scarring.  COPD.  No acute findings.  Tammy Parrett NP-C  [facility]  09/19/2015      Assessment & Plan:

## 2015-10-11 NOTE — Telephone Encounter (Signed)
LM for pt x 1  

## 2015-10-11 NOTE — Telephone Encounter (Signed)
Note she's referring to confusion re how to use her meds correctly/follow simplified instructions  which I documented in my last ov from 03/16/2014 but it's fine with me to change docs

## 2015-10-11 NOTE — Telephone Encounter (Signed)
[phone], pt cb

## 2015-10-11 NOTE — Progress Notes (Signed)
Chart and office note reviewed in detail  > agree with a/p as outlined    

## 2015-10-11 NOTE — Telephone Encounter (Signed)
Called and spoke with pt. Informed her of JN's decision. I scheduled her with JN on 02/19/16. She voiced understanding and had no further questions. Nothing further needed.

## 2015-10-11 NOTE — Telephone Encounter (Signed)
That's fine with me. JN

## 2015-10-11 NOTE — Telephone Encounter (Signed)
Dr Ashok Cordia please advise if you are okay with taking on this patient's care. Thanks.

## 2015-11-08 DIAGNOSIS — M1711 Unilateral primary osteoarthritis, right knee: Secondary | ICD-10-CM | POA: Diagnosis not present

## 2015-11-14 DIAGNOSIS — J329 Chronic sinusitis, unspecified: Secondary | ICD-10-CM | POA: Diagnosis not present

## 2015-11-22 DIAGNOSIS — L821 Other seborrheic keratosis: Secondary | ICD-10-CM | POA: Diagnosis not present

## 2015-11-22 DIAGNOSIS — R208 Other disturbances of skin sensation: Secondary | ICD-10-CM | POA: Diagnosis not present

## 2015-11-22 DIAGNOSIS — L309 Dermatitis, unspecified: Secondary | ICD-10-CM | POA: Diagnosis not present

## 2015-11-27 DIAGNOSIS — R7309 Other abnormal glucose: Secondary | ICD-10-CM | POA: Diagnosis not present

## 2015-11-27 DIAGNOSIS — F5101 Primary insomnia: Secondary | ICD-10-CM | POA: Diagnosis not present

## 2015-11-27 DIAGNOSIS — Z1239 Encounter for other screening for malignant neoplasm of breast: Secondary | ICD-10-CM | POA: Diagnosis not present

## 2015-11-27 DIAGNOSIS — I10 Essential (primary) hypertension: Secondary | ICD-10-CM | POA: Diagnosis not present

## 2015-11-27 DIAGNOSIS — E559 Vitamin D deficiency, unspecified: Secondary | ICD-10-CM | POA: Diagnosis not present

## 2015-11-29 DIAGNOSIS — R7303 Prediabetes: Secondary | ICD-10-CM | POA: Insufficient documentation

## 2015-12-11 DIAGNOSIS — Z1231 Encounter for screening mammogram for malignant neoplasm of breast: Secondary | ICD-10-CM | POA: Diagnosis not present

## 2015-12-11 DIAGNOSIS — L239 Allergic contact dermatitis, unspecified cause: Secondary | ICD-10-CM | POA: Diagnosis not present

## 2015-12-13 DIAGNOSIS — H401122 Primary open-angle glaucoma, left eye, moderate stage: Secondary | ICD-10-CM | POA: Diagnosis not present

## 2015-12-13 DIAGNOSIS — H401113 Primary open-angle glaucoma, right eye, severe stage: Secondary | ICD-10-CM | POA: Diagnosis not present

## 2016-01-12 DIAGNOSIS — L729 Follicular cyst of the skin and subcutaneous tissue, unspecified: Secondary | ICD-10-CM | POA: Diagnosis not present

## 2016-01-15 DIAGNOSIS — H00022 Hordeolum internum right lower eyelid: Secondary | ICD-10-CM | POA: Diagnosis not present

## 2016-02-19 ENCOUNTER — Institutional Professional Consult (permissible substitution): Payer: Medicare Other | Admitting: Pulmonary Disease

## 2016-02-21 DIAGNOSIS — H401113 Primary open-angle glaucoma, right eye, severe stage: Secondary | ICD-10-CM | POA: Diagnosis not present

## 2016-03-12 DIAGNOSIS — R5383 Other fatigue: Secondary | ICD-10-CM | POA: Diagnosis not present

## 2016-03-13 ENCOUNTER — Emergency Department (HOSPITAL_BASED_OUTPATIENT_CLINIC_OR_DEPARTMENT_OTHER)
Admission: EM | Admit: 2016-03-13 | Discharge: 2016-03-13 | Disposition: A | Discharge status: Home / Self Care | Payer: Medicare Other | Attending: Emergency Medicine | Admitting: Emergency Medicine

## 2016-03-13 ENCOUNTER — Emergency Department (HOSPITAL_BASED_OUTPATIENT_CLINIC_OR_DEPARTMENT_OTHER): Payer: Medicare Other

## 2016-03-13 ENCOUNTER — Encounter (HOSPITAL_BASED_OUTPATIENT_CLINIC_OR_DEPARTMENT_OTHER): Payer: Self-pay | Admitting: *Deleted

## 2016-03-13 DIAGNOSIS — J45909 Unspecified asthma, uncomplicated: Secondary | ICD-10-CM | POA: Diagnosis not present

## 2016-03-13 DIAGNOSIS — R21 Rash and other nonspecific skin eruption: Secondary | ICD-10-CM | POA: Diagnosis not present

## 2016-03-13 DIAGNOSIS — I10 Essential (primary) hypertension: Secondary | ICD-10-CM | POA: Diagnosis not present

## 2016-03-13 DIAGNOSIS — M7989 Other specified soft tissue disorders: Secondary | ICD-10-CM | POA: Diagnosis not present

## 2016-03-13 DIAGNOSIS — M25562 Pain in left knee: Secondary | ICD-10-CM | POA: Diagnosis not present

## 2016-03-13 DIAGNOSIS — Z79899 Other long term (current) drug therapy: Secondary | ICD-10-CM | POA: Diagnosis not present

## 2016-03-13 DIAGNOSIS — R609 Edema, unspecified: Secondary | ICD-10-CM | POA: Insufficient documentation

## 2016-03-13 LAB — CBG MONITORING, ED
GLUCOSE-CAPILLARY: 106 mg/dL — AB (ref 65–99)
Glucose-Capillary: 77 mg/dL (ref 65–99)

## 2016-03-13 NOTE — ED Notes (Signed)
MD at bedside. 

## 2016-03-13 NOTE — ED Notes (Signed)
Assisted to restroom. Ambulatory with slight limp.

## 2016-03-13 NOTE — ED Notes (Signed)
Returned from [employer]. Pt states she felt her blood sugar was getting a little low. CBG checked by EMT. 68. Dr. advised.

## 2016-03-13 NOTE — ED Triage Notes (Signed)
Pt states her left leg has been red x 1 year. Yesterday noticed pain behind her left knee. Saw PCP "because I was tired, but they just sort of made fun of me." LAbs were drawn. Swelling noted to left lower ext, but pt unsure how long. Took Meloxicam at 7:30 am. +dpp palp. Cap refill < 3 sec.

## 2016-03-13 NOTE — ED Provider Notes (Signed)
[facility] Provider Note   CSN: [id number] Arrival date & time: 03/13/16  0902     History   Chief Complaint Chief Complaint  Patient presents with  . Leg Pain    HPI Cassandra Sims is a 82 y.o. female.  HPI Patient presents with one year of left leg pain. Worse in the last few days. States the pain is mostly in her left knee. Pain with range of motion and difficulty ambulating. States the knee gives out on her. No known trauma to the knee. She also is having diffuse muscular pain to the left leg. Has previous history of varicose veins and arthritis. No recent immobilization or extended travel. No shortness of breath or chest pain. Denies any focal weakness or numbness. Past Medical History:  Diagnosis Date  . Arthritis   . Asthma   . Complication of anesthesia    hard to wake up  . GERD (gastroesophageal reflux disease)   . Glaucoma   . HOH (hard of hearing)   . Hypertension   . Multiple allergies   . Peripheral vascular disease (HCC)    varicose veins   . PONV (postoperative nausea and vomiting)   . Rhinitis   . Wears glasses     Patient Active Problem List   Diagnosis Date Noted  . Sinusitis, chronic 11/25/2012  . Cough variant asthma 11/25/2012  . Cough 11/24/2012    Past Surgical History:  Procedure Laterality Date  . ABDOMINAL HYSTERECTOMY    . APPENDECTOMY    . COLECTOMY  2008   obstruction  . DIAGNOSTIC LAPAROSCOPY     exp  . DILATION AND CURETTAGE OF UTERUS    . FUNCTIONAL ENDOSCOPIC SINUS SURGERY  2009   bilat ethm,frontal,max  . NASAL SINUS SURGERY Bilateral 10/26/2012   Procedure: BILATERAL ENDOSCOPIC REVISION ETHMOID MAXILLARY AND FRONTAL SINUS SURGERY  ;  Surgeon: Izora Gala, MD;  Location: [facility];  Service: ENT;  Laterality: Bilateral;  . OVARIAN CYST SURGERY    . TONSILLECTOMY    . TUBAL LIGATION    . URETER SURGERY     cut accidentally during exp lap-ovarien cyst    OB History    No data available         Home Medications    Prior to Admission medications   Medication Sig Start Date End Date Taking? Authorizing Provider  acetaminophen (TYLENOL) 325 MG tablet Per bottle as needed for pain    Historical Provider, MD  albuterol (PROAIR HFA) 108 (90 BASE) MCG/ACT inhaler Inhale 2 puffs into the lungs every 4 (four) hours as needed for wheezing or shortness of breath. 11/29/13   Tanda Rockers, MD  albuterol (PROVENTIL HFA;VENTOLIN HFA) 108 (90 BASE) MCG/ACT inhaler Inhale 2 puffs into the lungs every 4 (four) hours as needed for wheezing. Reported on 10/10/2015    Historical Provider, MD  amLODipine (NORVASC) 10 MG tablet Take 5 mg by mouth daily.     Historical Provider, MD  calcium-vitamin D (OSCAL WITH D) 500-200 MG-UNIT per tablet Take 1 tablet by mouth 2 (two) times daily.     Historical Provider, MD  cetirizine (ZYRTEC) 10 MG tablet Take 10 mg by mouth at bedtime. Reported on 10/10/2015    Historical Provider, MD  cholecalciferol (VITAMIN D) 1000 UNITS tablet Take 1,000 Units by mouth daily.    Historical Provider, MD  dextromethorphan (DELSYM) 30 MG/5ML liquid Reported on 10/10/2015    Historical Provider, MD  famotidine (PEPCID) 20 MG  tablet Take 20 mg by mouth at bedtime. Reported on 10/10/2015    Historical Provider, MD  guaiFENesin (MUCINEX) 600 MG 12 hr tablet Reported on 10/10/2015    Historical Provider, MD  lactobacillus acidophilus (BACID) TABS Take 1 tablet by mouth daily.     Historical Provider, MD  loratadine (CLARITIN) 10 MG tablet Take 10 mg by mouth daily.    Historical Provider, MD  Misc Natural Products (LUTEIN 20) CAPS Take 1 capsule by mouth every morning.    Historical Provider, MD  mometasone-formoterol (DULERA) 100-5 MCG/ACT AERO Take 2 puffs first thing in am and then another 2 puffs about 12 hours later. Patient taking differently: Take 1 puff first thing in am and then another 1 puff about 12 hours later. 04/01/13   Tanda Rockers, MD  montelukast (SINGULAIR) 10 MG  tablet Take 10 mg by mouth at bedtime.    Historical Provider, MD  Multiple Vitamins-Minerals (CENTRUM SILVER PO) Take 1 tablet by mouth daily.     Historical Provider, MD  omeprazole (PRILOSEC) 20 MG capsule Take 20 mg by mouth daily before breakfast. Reported on 10/10/2015    Historical Provider, MD  psyllium (METAMUCIL) 58.6 % powder 1 tsp every morning    Historical Provider, MD  telmisartan (MICARDIS) 40 MG tablet Take 40 mg by mouth daily.    Historical Provider, MD    Family History History reviewed. No pertinent family history.  Social History Social History  Substance Use Topics  . Smoking status: Never Smoker  . Smokeless tobacco: Never Used  . Alcohol use No     Allergies   Penicillins; Adhesive [tape]; Clindamycin/lincomycin; Codeine; Darvocet [propoxyphene n-acetaminophen]; Dilaudid [hydromorphone hcl]; Flagyl [metronidazole]; Lactose intolerance (gi); Levofloxacin; Macrodantin [nitrofurantoin macrocrystal]; Morphine and related; Premarin [conjugated estrogens]; Zofran [ondansetron hcl]; Nitrofuran derivatives; and Sulfa antibiotics   Review of Systems Review of Systems  Constitutional: Negative for chills and fever.  Respiratory: Negative for shortness of breath.   Cardiovascular: Negative for chest pain.  Musculoskeletal: Positive for arthralgias, joint swelling and myalgias. Negative for back pain.  Skin: Negative for rash and wound.  Neurological: Negative for weakness and numbness.  All other systems reviewed and are negative.    Physical Exam Updated Vital Signs BP 149/82 (BP Location: Right Arm)   Pulse 88   Temp 98.5 F (36.9 C) (Oral)   Resp 20   Ht 5' 2.5" (1.588 m)   Wt 116 lb (52.6 kg)   SpO2 95%   BMI 20.88 kg/m   Physical Exam  Constitutional: She is oriented to person, place, and time. She appears well-developed and well-nourished.  HENT:  Head: Normocephalic and atraumatic.  Eyes: EOM are normal. Pupils are equal, round, and reactive to  light.  Neck: Normal range of motion. Neck supple.  Cardiovascular: Normal rate.   Pulmonary/Chest: Effort normal and breath sounds normal.  Abdominal: Soft.  Musculoskeletal: She exhibits edema and tenderness. She exhibits no deformity.  Diffuse tenderness to palpation to the left knee. There is a suprapatellar effusion present. Decreased range of motion due to pain. No ligamentous laxity appreciated. No popliteal fossa fullness. No calf asymmetry or tenderness to palpation. 2+ dorsalis pedis and posterior tibial pulses bilaterally.  Neurological: She is alert and oriented to person, place, and time.  5/5 motor in all extremities. Sensation fully intact.  Skin: Skin is warm and dry. Rash noted. There is erythema.  Patient with multiple superficial veins to bilateral lower extremities. She also has petechial rash right around the  ankles which is bilaterally noted. No increased warmth.   Psychiatric: She has a normal mood and affect. Her behavior is normal.  Nursing note and vitals reviewed.    ED Treatments / Results  Labs (all labs ordered are listed, but only abnormal results are displayed) Labs Reviewed  CBG MONITORING, ED - Abnormal; Notable for the following:       Result Value   Glucose-Capillary 106 (*)    All other components within normal limits  CBG MONITORING, ED    EKG  EKG Interpretation None       Radiology US Venous Img Lower Unilateral Left  Result Date: 03/13/2016 CLINICAL DATA:  Left lower extremity redness and swelling, chronic EXAM: LEFT LOWER EXTREMITY VENOUS DUPLEX ULTRASOUND TECHNIQUE: Gray-scale sonography with graded compression, as well as color Doppler and duplex ultrasound were performed to evaluate the left lower extremity deep venous system from the level of the common femoral vein and including the common femoral, femoral, profunda femoral, popliteal and calf veins including the posterior tibial, peroneal and gastrocnemius veins when visible. The  superficial great saphenous vein was also interrogated. Spectral Doppler was utilized to evaluate flow at rest and with distal augmentation maneuvers in the common femoral, femoral and popliteal veins. COMPARISON:  None. FINDINGS: Contralateral Common Femoral Vein: Respiratory phasicity is normal and symmetric with the symptomatic side. No evidence of thrombus. Normal compressibility. Common Femoral Vein: No evidence of thrombus. Normal compressibility, respiratory phasicity and response to augmentation. Saphenofemoral Junction: No evidence of thrombus. Normal compressibility and flow on color Doppler imaging. Profunda Femoral Vein: No evidence of thrombus. Normal compressibility and flow on color Doppler imaging. Femoral Vein: No evidence of thrombus. Normal compressibility, respiratory phasicity and response to augmentation. Popliteal Vein: No evidence of thrombus. Normal compressibility, respiratory phasicity and response to augmentation. Calf Veins: No evidence of thrombus. Normal compressibility and flow on color Doppler imaging. Superficial Great Saphenous Vein: No evidence of thrombus. Normal compressibility and flow on color Doppler imaging. Venous Reflux:  None. Other Findings:  None. IMPRESSION: No evidence of left lower extremity deep venous thrombosis. Right common femoral vein also patent. Electronically Signed   By: Lowella Grip III M.D.   On: 03/13/2016 11:51   Dg Knee Complete 4 Views Left  Result Date: 03/13/2016 CLINICAL DATA:  Left knee pain started last night.  No known injury. EXAM: LEFT KNEE - COMPLETE 4+ VIEW COMPARISON:  None. FINDINGS: Mild degenerative spurring noted in the medial compartment. No fracture. No subluxation. No evidence of joint effusion. IMPRESSION: Minimal degenerative change.  No acute findings. Electronically Signed   By: Misty Stanley M.D.   On: 03/13/2016 10:13    Procedures Procedures (including critical care time)  Medications Ordered in ED Medications  - No data to display   Initial Impression / Assessment and Plan / ED Course  I have reviewed the triage vital signs and the nursing notes.  Pertinent labs & imaging results that were available during my care of the patient were reviewed by me and considered in my medical decision making (see chart for details).  Clinical Course  Mild arthritis on x-ray. She'll negative for DVT. Placed in a knee sleeve. Patient states this improved her symptoms. She is ambulating without any difficulty. She will follow-up with her orthopedist for possible MRI. Return precautions given.    Final Clinical Impressions(s) / ED Diagnoses   Final diagnoses:  Left knee pain    New Prescriptions New Prescriptions   No medications on file  Julianne Rice, MD 03/13/16 1314

## 2016-03-29 ENCOUNTER — Ambulatory Visit (INDEPENDENT_AMBULATORY_CARE_PROVIDER_SITE_OTHER): Payer: Medicare Other | Admitting: Pulmonary Disease

## 2016-03-29 ENCOUNTER — Other Ambulatory Visit (INDEPENDENT_AMBULATORY_CARE_PROVIDER_SITE_OTHER): Payer: Medicare Other

## 2016-03-29 ENCOUNTER — Encounter: Payer: Self-pay | Admitting: Pulmonary Disease

## 2016-03-29 VITALS — BP 138/70 | HR 65 | Ht 62.5 in | Wt 120.8 lb

## 2016-03-29 DIAGNOSIS — J986 Disorders of diaphragm: Secondary | ICD-10-CM | POA: Diagnosis not present

## 2016-03-29 DIAGNOSIS — J329 Chronic sinusitis, unspecified: Secondary | ICD-10-CM

## 2016-03-29 DIAGNOSIS — J45991 Cough variant asthma: Secondary | ICD-10-CM

## 2016-03-29 DIAGNOSIS — M1990 Unspecified osteoarthritis, unspecified site: Secondary | ICD-10-CM | POA: Insufficient documentation

## 2016-03-29 DIAGNOSIS — S83207A Unspecified tear of unspecified meniscus, current injury, left knee, initial encounter: Secondary | ICD-10-CM | POA: Diagnosis not present

## 2016-03-29 DIAGNOSIS — I10 Essential (primary) hypertension: Secondary | ICD-10-CM | POA: Insufficient documentation

## 2016-03-29 DIAGNOSIS — H409 Unspecified glaucoma: Secondary | ICD-10-CM | POA: Insufficient documentation

## 2016-03-29 LAB — CBC WITH DIFFERENTIAL/PLATELET
BASOS ABS: 0 10*3/uL (ref 0.0–0.1)
Basophils Relative: 0.4 % (ref 0.0–3.0)
EOS ABS: 0.6 10*3/uL (ref 0.0–0.7)
Eosinophils Relative: 6.9 % — ABNORMAL HIGH (ref 0.0–5.0)
HEMATOCRIT: 40.6 % (ref 36.0–46.0)
Hemoglobin: 13.8 g/dL (ref 12.0–15.0)
LYMPHS PCT: 23 % (ref 12.0–46.0)
Lymphs Abs: 2 10*3/uL (ref 0.7–4.0)
MCHC: 34 g/dL (ref 30.0–36.0)
MCV: 90.5 fl (ref 78.0–100.0)
Monocytes Absolute: 1.1 10*3/uL — ABNORMAL HIGH (ref 0.1–1.0)
Monocytes Relative: 12.6 % — ABNORMAL HIGH (ref 3.0–12.0)
NEUTROS ABS: 4.9 10*3/uL (ref 1.4–7.7)
NEUTROS PCT: 57.1 % (ref 43.0–77.0)
PLATELETS: 232 10*3/uL (ref 150.0–400.0)
RBC: 4.49 Mil/uL (ref 3.87–5.11)
RDW: 13.6 % (ref 11.5–15.5)
WBC: 8.6 10*3/uL (ref 4.0–10.5)

## 2016-03-29 NOTE — Patient Instructions (Addendum)
   Continue taking your medications as prescribed.  I will see you back in 6 weeks to review your test results. Call me if you have any problems or questions before then.   TESTS ORDERED: 1. Full PFTs before next appointment 2. Serum RAST Panel & CBC w/ Differential  3. Esophagram  4. Sniff Test

## 2016-03-29 NOTE — Progress Notes (Signed)
Subjective:    Patient ID: Cassandra Sims, female    DOB: [birth year], 82 y.o.   MRN: [id number]  C.C.:  Follow-up for Cough Variant Asthma & Chronic Sinusitis.  HPI Cough Variant Asthma:  She reports she noticed increased breathing problems starting with what sounds to be a paralyzed hemidiaphragm. She has been on [facility] for some time. She reports she rarely needs to use her Ventolin rescue inhaler. She has used it 3-4 times in the last 6 weeks when she was remodeling her bathroom. She reports she coughs more with laying recumbent. Her rescue inhaler does seem to help her cough.   Chronic Sinusitis:  She has continued to take Claritin & Singulair daily for her allergies. She reports she continues to have some sinus congestion, pressure, & drainage despite their help. She does a Netti Pot nasal saline rinse twice daily.   Review of Systems She denies any reflux or dyspepsia. No morning brash water taste. Does have occasional eructation when laying recumbent. No fever, chills, or sweats. No chest pain, tightness, or pressure.   Allergies  Allergen Reactions  . Penicillins Shortness Of Breath  . Adhesive [Tape] Itching  . Clindamycin/Lincomycin Itching  . Codeine Nausea And Vomiting  . Darvocet [Propoxyphene N-Acetaminophen]     HTN  . Dilaudid [Hydromorphone Hcl] Nausea And Vomiting  . Flagyl [Metronidazole]   . Lactose Intolerance (Gi)   . Levofloxacin     Joint pain  . Macrodantin [Nitrofurantoin Macrocrystal]   . Morphine And Related     hallucinate  . Premarin [Conjugated Estrogens] Itching  . Zofran [Ondansetron Hcl]     HTN  . Nitrofuran Derivatives Rash  . Sulfa Antibiotics Rash    Current Outpatient Prescriptions on File Prior to Visit  Medication Sig Dispense Refill  . acetaminophen (TYLENOL) 325 MG tablet Per bottle as needed for pain    . albuterol (PROVENTIL HFA;VENTOLIN HFA) 108 (90 BASE) MCG/ACT inhaler Inhale 2 puffs into the lungs every 4 (four) hours as needed  for wheezing. Reported on 10/10/2015    . amLODipine (NORVASC) 10 MG tablet Take 5 mg by mouth daily.     . calcium-vitamin D (OSCAL WITH D) 500-200 MG-UNIT per tablet Take 1 tablet by mouth 2 (two) times daily.     . cholecalciferol (VITAMIN D) 1000 UNITS tablet Take 1,000 Units by mouth daily.    Marland Kitchen guaiFENesin (MUCINEX) 600 MG 12 hr tablet Reported on 10/10/2015    . lactobacillus acidophilus (BACID) TABS Take 1 tablet by mouth daily.     Marland Kitchen loratadine (CLARITIN) 10 MG tablet Take 10 mg by mouth daily.    . Misc Natural Products (LUTEIN 20) CAPS Take 1 capsule by mouth every morning.    . mometasone-formoterol (DULERA) 100-5 MCG/ACT AERO Take 2 puffs first thing in am and then another 2 puffs about 12 hours later. (Patient taking differently: Take 1 puff first thing in am and then another 1 puff about 12 hours later.) 3 Inhaler 3  . montelukast (SINGULAIR) 10 MG tablet Take 10 mg by mouth at bedtime.    . Multiple Vitamins-Minerals (CENTRUM SILVER PO) Take 1 tablet by mouth daily.     . psyllium (METAMUCIL) 58.6 % powder 1 tsp every morning    . telmisartan (MICARDIS) 40 MG tablet Take 40 mg by mouth daily.    Marland Kitchen albuterol (PROAIR HFA) 108 (90 BASE) MCG/ACT inhaler Inhale 2 puffs into the lungs every 4 (four) hours as needed for wheezing or shortness  of breath. (Patient not taking: Reported on 03/29/2016) 1 Inhaler 3  . cetirizine (ZYRTEC) 10 MG tablet Take 10 mg by mouth at bedtime. Reported on 10/10/2015    . dextromethorphan (DELSYM) 30 MG/5ML liquid Reported on 10/10/2015    . famotidine (PEPCID) 20 MG tablet Take 20 mg by mouth at bedtime. Reported on 10/10/2015    . omeprazole (PRILOSEC) 20 MG capsule Take 20 mg by mouth daily before breakfast. Reported on 10/10/2015     No current facility-administered medications on file prior to visit.     Past Medical History:  Diagnosis Date  . Arthritis   . Asthma   . Complication of anesthesia    hard to wake up  . GERD (gastroesophageal reflux  disease)   . Glaucoma   . HOH (hard of hearing)   . Hypertension   . Multiple allergies   . Peripheral vascular disease (HCC)    varicose veins   . PONV (postoperative nausea and vomiting)   . Rhinitis   . Wears glasses     Past Surgical History:  Procedure Laterality Date  . ABDOMINAL HYSTERECTOMY    . COLECTOMY  2008   obstruction  . DIAGNOSTIC LAPAROSCOPY     exp  . DILATION AND CURETTAGE OF UTERUS    . FUNCTIONAL ENDOSCOPIC SINUS SURGERY  2009   bilat ethm,frontal,max  . NASAL SINUS SURGERY Bilateral 10/26/2012   Procedure: BILATERAL ENDOSCOPIC REVISION ETHMOID MAXILLARY AND FRONTAL SINUS SURGERY  ;  Surgeon: Izora Gala, MD;  Location: [facility];  Service: ENT;  Laterality: Bilateral;  . OVARIAN CYST SURGERY    . TUBAL LIGATION    . URETER SURGERY     cut accidentally during exp lap-ovarien cyst    Family History  Problem Relation Age of Onset  . Glaucoma Mother   . Arthritis Mother   . Lung disease Sister   . Breast cancer Daughter   . Diabetes Mellitus I Other     Social History   Social History  . Marital status: Divorced    Spouse name: N/A  . Number of children: N/A  . Years of education: N/A   Social History Main Topics  . Smoking status: Never Smoker  . Smokeless tobacco: Never Used  . Alcohol use No  . Drug use: No  . Sexual activity: Not Asked   Other Topics Concern  . None   Social History Narrative   Originally from [place]. Previously lived in [place] for 14 months. 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 No bird, mold, or hot tub exposure.       Objective:   Physical Exam BP 138/70 (BP Location: Right Arm, Patient Position: Sitting, Cuff Size: Normal)   Pulse 65   Ht 5' 2.5" (1.588 m)   Wt 120 lb 12.8 oz (54.8 kg)   SpO2 95%   BMI 21.74 kg/m  General:  Awake. Alert. No distress.  Integument:  Warm & dry. No rash on exposed skin. No bruising. HEENT:  Moist mucus membranes. No oral ulcers. No scleral injection  or icterus.  Cardiovascular:  Regular rate. No edema. Normal S1 & S2. Pulmonary:  Good aeration & clear to auscultation bilaterally. Symmetric chest wall expansion. No accessory muscle use. Abdomen: Soft. Normal bowel sounds. Nondistended. Grossly nontender. Musculoskeletal:  Normal bulk and tone. No joint deformity or effusion appreciated.  PFT 08/04/12: FVC 2.81 L (110%) FEV1 1.87 L (107%) FEV1/FVC 0.67 FEF 25-75 0.16 L (57%) negative bronchodilator response  TLC 4.33 L (95%) RV 79% ERV 87% DLCO uncorrected 93%  IMAGING CXR PA/LAT 09/19/15 (personally reviewed by me):  Chronic elevation of left hemidiaphragm. No focal opacity. No effusion. Normal mediastinal contour and heart size.     Assessment & Plan:  82 y.o. female previously followed for her chronic sinusitis and cough variant asthma. I reviewed the patient's chest x-ray imaging from February which does show chronic elevation of her left hemidiaphragm. She has no history of any cervical spine or thoracic surgery that would lead to a diaphragmatic paralysis but this needs to be investigated further. Her asthma seems to be recently well-controlled at this time. The nocturnal cough she is experiencing is possibly secondary to silent laryngo-esophageal reflux as she has previously stopped her Prilosec. I instructed the patient contact my office if she had any further questions or concerns before her next appointment.  1. Cough variant asthma: Continuing Dulera and Singulair. No changes. Checking full pulmonary function testing before next appointment. Checking esophagram given ongoing cough. 2. Chronic sinusitis: Patient continuing on Claritin and Singulair along with nasal saline rinse. Checking serum RAST panel & CBC with differential. 3. Elevated left hemidiaphragm: Checking sniff test. 4. Health maintenance: Status post Pneumovax May 2010. 5. Follow-up: Patient to return to clinic in 6 weeks or sooner if needed.  Sonia Baller Ashok Cordia,  M.D. [facility] Pager:  [phone] After 3pm or if no response, call [phone] 1:13 PM 03/29/16

## 2016-04-01 LAB — RESPIRATORY ALLERGY PROFILE REGION II ~~LOC~~
Allergen, C. Herbarum, M2: <0.1 kU/L
Allergen, Comm Silver Birch, t9: <0.1 kU/L
Allergen, Mulberry, t76: <0.1 kU/L
Allergen, P. notatum, m1: <0.1 kU/L
Aspergillus fumigatus, m3: <0.1 kU/L
Bermuda Grass: <0.1 kU/L
Box Elder IgE: <0.1 kU/L
Cockroach: <0.1 kU/L
Common Ragweed: <0.1 kU/L
IgE (Immunoglobulin E), Serum: 14 kU/L (ref <115)
Sheep Sorrel IgE: <0.1 kU/L

## 2016-04-02 ENCOUNTER — Telehealth: Payer: Self-pay | Admitting: Pulmonary Disease

## 2016-04-02 NOTE — Telephone Encounter (Signed)
Spoke with pt and gave results. Nothing further needed.  

## 2016-04-02 NOTE — Telephone Encounter (Signed)
Pt is calling requesting her lab results. Please advise Dr. Ashok Cordia thanks

## 2016-04-02 NOTE — Telephone Encounter (Signed)
Please let the patient know that her blood work was all normal. There were no specific allergies that were evident.

## 2016-04-05 ENCOUNTER — Ambulatory Visit (HOSPITAL_COMMUNITY)
Admission: RE | Admit: 2016-04-05 | Discharge: 2016-04-05 | Disposition: A | Discharge status: Home / Self Care | Payer: Medicare Other | Source: Ambulatory Visit | Attending: Pulmonary Disease | Admitting: Pulmonary Disease

## 2016-04-05 ENCOUNTER — Other Ambulatory Visit: Payer: Self-pay | Admitting: Pulmonary Disease

## 2016-04-05 DIAGNOSIS — J986 Disorders of diaphragm: Secondary | ICD-10-CM

## 2016-04-05 DIAGNOSIS — K219 Gastro-esophageal reflux disease without esophagitis: Secondary | ICD-10-CM | POA: Diagnosis not present

## 2016-04-05 DIAGNOSIS — J45991 Cough variant asthma: Secondary | ICD-10-CM | POA: Diagnosis not present

## 2016-04-05 DIAGNOSIS — J329 Chronic sinusitis, unspecified: Secondary | ICD-10-CM | POA: Diagnosis not present

## 2016-04-05 DIAGNOSIS — R05 Cough: Secondary | ICD-10-CM | POA: Diagnosis not present

## 2016-04-08 DIAGNOSIS — M25562 Pain in left knee: Secondary | ICD-10-CM | POA: Diagnosis not present

## 2016-04-09 ENCOUNTER — Ambulatory Visit (HOSPITAL_COMMUNITY): Payer: Medicare Other

## 2016-04-09 ENCOUNTER — Other Ambulatory Visit (HOSPITAL_COMMUNITY): Payer: Medicare Other

## 2016-04-09 DIAGNOSIS — H401113 Primary open-angle glaucoma, right eye, severe stage: Secondary | ICD-10-CM | POA: Diagnosis not present

## 2016-04-09 DIAGNOSIS — H01002 Unspecified blepharitis right lower eyelid: Secondary | ICD-10-CM | POA: Diagnosis not present

## 2016-04-09 DIAGNOSIS — H01004 Unspecified blepharitis left upper eyelid: Secondary | ICD-10-CM | POA: Diagnosis not present

## 2016-04-09 DIAGNOSIS — H401122 Primary open-angle glaucoma, left eye, moderate stage: Secondary | ICD-10-CM | POA: Diagnosis not present

## 2016-04-09 DIAGNOSIS — H00022 Hordeolum internum right lower eyelid: Secondary | ICD-10-CM | POA: Diagnosis not present

## 2016-04-09 DIAGNOSIS — H01001 Unspecified blepharitis right upper eyelid: Secondary | ICD-10-CM | POA: Diagnosis not present

## 2016-04-09 DIAGNOSIS — H01005 Unspecified blepharitis left lower eyelid: Secondary | ICD-10-CM | POA: Diagnosis not present

## 2016-04-10 DIAGNOSIS — M1712 Unilateral primary osteoarthritis, left knee: Secondary | ICD-10-CM | POA: Diagnosis not present

## 2016-04-10 DIAGNOSIS — S83207D Unspecified tear of unspecified meniscus, current injury, left knee, subsequent encounter: Secondary | ICD-10-CM | POA: Diagnosis not present

## 2016-04-11 DIAGNOSIS — L820 Inflamed seborrheic keratosis: Secondary | ICD-10-CM | POA: Diagnosis not present

## 2016-04-11 DIAGNOSIS — D692 Other nonthrombocytopenic purpura: Secondary | ICD-10-CM | POA: Diagnosis not present

## 2016-04-12 ENCOUNTER — Telehealth: Payer: Self-pay | Admitting: Pulmonary Disease

## 2016-04-12 MED ORDER — RANITIDINE HCL 150 MG PO TABS: 150.0000 mg | ORAL_TABLET | Freq: Every day | ORAL | 3 refills | Status: DC

## 2016-04-12 NOTE — Telephone Encounter (Signed)
It's ultimately up to her because it has been there for some time. Given the chronicity it can wait if she wants to talk about it.

## 2016-04-12 NOTE — Telephone Encounter (Signed)
Patient called and wanted to let us know why she declines the CT -pr

## 2016-04-12 NOTE — Telephone Encounter (Signed)
LMOMTCB x 1 

## 2016-04-12 NOTE — Telephone Encounter (Signed)
Cassandra Glazier, MD  Inge Rise, CMA        Please call the patient and let her know that she does have reflux on her barium swallow & her sniff test looks like the left side of her diaphragm is paralyzed. We need to start her on Zantac 150mg  qhs - #30 with 3 refills. We need to do a CT scan of her neck/soft tissue with and without contrast and chest with contrast to see if she has any mass or reason why her diaphragm may be paralyzed. Thanks.   ---- I spoke with patient about results and she verbalized understanding and had no questions. Rx for zantac sent in. She also reports she has been told that she has "scar tissue pushing up" is the reason she has a paralyzed diaphragm. She wants to know if this is really serious bc if not, she prefers not to go through the other tests. Please advise thanks

## 2016-04-12 NOTE — Telephone Encounter (Signed)
Called spoke with patient who stated that she is rethinking her earlier decision to decline CT neck.  She asked why JN would like her to have CT done? > is he looking for something different than they already know about her diaphragm?  Read to patient JN's recommendation for the CT verbatim:  We need to do a CT scan of her neck/soft tissue with and without contrast and chest with contrast to see if she has any mass or reason why her diaphragm may be paralyzed. Thanks.   Pt understands JN's recommendation and asked that we send message to [facility] asking if he feels CT needs to be done now or if it can wait for her Nov appt with JN to discuss.  Dr Ashok Cordia please advise, thank you.

## 2016-04-15 ENCOUNTER — Telehealth: Payer: Self-pay | Admitting: Pulmonary Disease

## 2016-04-15 NOTE — Telephone Encounter (Signed)
Spoke with pt. She would like to wait until her next appointment to discuss this with JN. Nothing further was needed at this time.

## 2016-04-15 NOTE — Telephone Encounter (Signed)
Patient returning call -pr °

## 2016-04-15 NOTE — Telephone Encounter (Signed)
lmtcb x1 for pt. 

## 2016-04-15 NOTE — Telephone Encounter (Signed)
Spoke with pt. She was confused and didn't mean to call back. Nothing further was needed at this time.

## 2016-04-17 DIAGNOSIS — H401113 Primary open-angle glaucoma, right eye, severe stage: Secondary | ICD-10-CM | POA: Diagnosis not present

## 2016-04-17 DIAGNOSIS — M1712 Unilateral primary osteoarthritis, left knee: Secondary | ICD-10-CM | POA: Diagnosis not present

## 2016-04-17 DIAGNOSIS — H401122 Primary open-angle glaucoma, left eye, moderate stage: Secondary | ICD-10-CM | POA: Diagnosis not present

## 2016-04-18 ENCOUNTER — Telehealth: Payer: Self-pay | Admitting: Pulmonary Disease

## 2016-04-18 DIAGNOSIS — J986 Disorders of diaphragm: Secondary | ICD-10-CM

## 2016-04-18 NOTE — Telephone Encounter (Signed)
Pt calling and is now ready to set up appt for the CT chest and ct soft neck. Diagnosis for paralyzed diaphragm will not work for [place]. Please advise on another diagnosis I can use Dr. Ashok Cordia thanks

## 2016-04-18 NOTE — Telephone Encounter (Signed)
lmtcb x1 

## 2016-04-18 NOTE — Telephone Encounter (Signed)
If hemidiaphragm paralysis won't work then I know of none other.

## 2016-04-19 NOTE — Telephone Encounter (Signed)
lmtcb x2 for pt. 

## 2016-04-22 NOTE — Telephone Encounter (Signed)
Attempted to contact pt. Line rang busy x2. Will try back. 

## 2016-04-23 ENCOUNTER — Telehealth: Payer: Self-pay | Admitting: Pulmonary Disease

## 2016-04-23 NOTE — Telephone Encounter (Signed)
Patient called checking on status of CT scan appt -pr

## 2016-04-23 NOTE — Telephone Encounter (Signed)
Spoke with pt. She is aware of the below information. States that she is going to call her insurance company and see if they can tell her how much these scans would be out of pocket. Nothing further was needed at this time.

## 2016-04-23 NOTE — Telephone Encounter (Signed)
Pt stated that her insurance company will need a letter stating why she needs this CT done.  [place] stated that with the insurance that the pt has that no precert is needed.  JN please advise if you would want to do a letter for the insurance company.  thanks

## 2016-04-23 NOTE — Telephone Encounter (Signed)
I do not see an order for a ct neither future of present she has  A medicre hmo no precert will be required and the insurance company gets our ov notes when we bill a claim I am not sure what this is about Cassandra Sims

## 2016-04-23 NOTE — Telephone Encounter (Signed)
Attempted to contact pt. Line went dead after several rings. Will try back.

## 2016-04-23 NOTE — Telephone Encounter (Signed)
Which CT scan is this for?  Chest or Neck? I will be happy to do the letter if necessary just need to know which one.

## 2016-04-23 NOTE — Telephone Encounter (Signed)
Spoke with pt and she states that she has talked to Medicare and they are stating that she needs a letter of medical necessity in order to have CT scan.  Forwarded to [facility] for follow up. Thanks!

## 2016-04-24 DIAGNOSIS — M79672 Pain in left foot: Secondary | ICD-10-CM | POA: Diagnosis not present

## 2016-04-24 DIAGNOSIS — M1711 Unilateral primary osteoarthritis, right knee: Secondary | ICD-10-CM | POA: Diagnosis not present

## 2016-04-24 DIAGNOSIS — M79671 Pain in right foot: Secondary | ICD-10-CM | POA: Diagnosis not present

## 2016-04-25 NOTE — Telephone Encounter (Deleted)
It's for both, chest and neck CT.

## 2016-04-26 ENCOUNTER — Encounter: Payer: Self-pay | Admitting: Pulmonary Disease

## 2016-04-26 NOTE — Telephone Encounter (Signed)
Talk of CT came from pt's Sniff Test results: Result Notes  Notes Recorded by Inge Rise, CMA on 04/12/2016 at 10:59 AM EDT See phone note 04/12/16 ------ Notes Recorded by Inge Rise, [place] on 04/11/2016 at 5:12 PM EDT lmomtcb x1 ------ Notes Recorded by Javier Glazier, MD on 04/10/2016 at 5:32 PM EDT Please call the patient and let her know that she does have reflux on her barium swallow & her sniff test looks like the left side of her diaphragm is paralyzed. We need to start her on Zantac 150mg  qhs - #30 with 3 refills. We need to do a CT scan of her neck/soft tissue with and without contrast and chest with contrast to see if she has any mass or reason why her diaphragm may be paralyzed. Thanks.   Then, per the 9.22.17 phone note pt decided to hold off on the CT of her neck/soft tissue until she next saw JN to discuss it further >> 11/16 w/ PFT prior.    Per the 9.28.17 phone note, we were unable to find a diagnosis to associate the CT with for insurance to pay.  Pt ultimately decided to call her insurance company to find out the cost of the CT, out of pocket because of the diagnosis issue.  Per this message, it sounds like insurance will cover her CT with a letter of medical necessity.  JN please advise, thank you.

## 2016-04-26 NOTE — Telephone Encounter (Signed)
Letter dictated

## 2016-04-29 NOTE — Telephone Encounter (Signed)
I truly do not know what this is for I am thinks maybe billing however the pt may need the copy sorry I juast dont know what to do with this Cassandra Sims

## 2016-04-29 NOTE — Telephone Encounter (Signed)
Called spoke with pt. Informed her that letter has been written. She requested a copy to be sent to her address in Epic. I informed her that I a copy would be sent today. I explained to her that per message from [facility] a precert was not required. She states she is unsure why the letter of necessity is needed but that medicare informed her that one was needed. She voiced understanding and had no further questions. Letter has been placed at placed at the front in out going mail.   Spoke with [employer] she request message to be to her for follow up.   LVM for pt to return call to inform her that [facility] will be looking further into situation.

## 2016-04-29 NOTE — Telephone Encounter (Signed)
I believe this pt has a sniff test and a mbs she is afraid her insurance will not pay for it however dr Ashok Cordia did a letter for the ins for proof she needed this done it will be available when in request ir Joellen [place]

## 2016-04-29 NOTE — Telephone Encounter (Signed)
I do not have a physical copy of this letter, but it is dictated in pt's chart.  Thanks!

## 2016-04-29 NOTE — Telephone Encounter (Signed)
Letter of medical necessity is in pt's chart. Routed to [facility] for follow up and to send out letter as needed.

## 2016-04-29 NOTE — Telephone Encounter (Signed)
Cassandra Sims - do you have this letter? Thanks!

## 2016-05-01 DIAGNOSIS — M1711 Unilateral primary osteoarthritis, right knee: Secondary | ICD-10-CM | POA: Diagnosis not present

## 2016-05-10 DIAGNOSIS — M25512 Pain in left shoulder: Secondary | ICD-10-CM | POA: Diagnosis not present

## 2016-05-10 DIAGNOSIS — M7741 Metatarsalgia, right foot: Secondary | ICD-10-CM | POA: Diagnosis not present

## 2016-05-10 DIAGNOSIS — M1711 Unilateral primary osteoarthritis, right knee: Secondary | ICD-10-CM | POA: Diagnosis not present

## 2016-05-13 DIAGNOSIS — Z780 Asymptomatic menopausal state: Secondary | ICD-10-CM | POA: Diagnosis not present

## 2016-05-13 DIAGNOSIS — M810 Age-related osteoporosis without current pathological fracture: Secondary | ICD-10-CM | POA: Diagnosis not present

## 2016-05-22 DIAGNOSIS — Z23 Encounter for immunization: Secondary | ICD-10-CM | POA: Diagnosis not present

## 2016-05-23 DIAGNOSIS — H35373 Puckering of macula, bilateral: Secondary | ICD-10-CM | POA: Diagnosis not present

## 2016-05-23 DIAGNOSIS — H401122 Primary open-angle glaucoma, left eye, moderate stage: Secondary | ICD-10-CM | POA: Diagnosis not present

## 2016-05-23 DIAGNOSIS — H35372 Puckering of macula, left eye: Secondary | ICD-10-CM | POA: Diagnosis not present

## 2016-05-23 DIAGNOSIS — H01005 Unspecified blepharitis left lower eyelid: Secondary | ICD-10-CM | POA: Diagnosis not present

## 2016-05-23 DIAGNOSIS — H401113 Primary open-angle glaucoma, right eye, severe stage: Secondary | ICD-10-CM | POA: Diagnosis not present

## 2016-05-23 DIAGNOSIS — H01001 Unspecified blepharitis right upper eyelid: Secondary | ICD-10-CM | POA: Diagnosis not present

## 2016-05-23 DIAGNOSIS — H01004 Unspecified blepharitis left upper eyelid: Secondary | ICD-10-CM | POA: Diagnosis not present

## 2016-05-23 DIAGNOSIS — Z961 Presence of intraocular lens: Secondary | ICD-10-CM | POA: Diagnosis not present

## 2016-05-23 DIAGNOSIS — H01002 Unspecified blepharitis right lower eyelid: Secondary | ICD-10-CM | POA: Diagnosis not present

## 2016-05-28 ENCOUNTER — Telehealth: Payer: Self-pay | Admitting: Pulmonary Disease

## 2016-05-28 DIAGNOSIS — J986 Disorders of diaphragm: Secondary | ICD-10-CM

## 2016-05-28 DIAGNOSIS — R05 Cough: Secondary | ICD-10-CM

## 2016-05-28 DIAGNOSIS — H401113 Primary open-angle glaucoma, right eye, severe stage: Secondary | ICD-10-CM | POA: Diagnosis not present

## 2016-05-28 DIAGNOSIS — H401122 Primary open-angle glaucoma, left eye, moderate stage: Secondary | ICD-10-CM | POA: Diagnosis not present

## 2016-05-28 DIAGNOSIS — R059 Cough, unspecified: Secondary | ICD-10-CM

## 2016-05-28 NOTE — Telephone Encounter (Signed)
Spoke with pt, states that she called her insurance company regarding the letter that was typed up for her CT, they told her that we would need to submit the letter to her insurance.  Letter is available in pt's chart.  PCC's please advise.  Thanks!

## 2016-05-29 NOTE — Telephone Encounter (Signed)
Called pt no answer will try later Cassandra Sims ° °

## 2016-05-31 NOTE — Telephone Encounter (Signed)
I have spoken to pt she read the letter to me and it does say dr Ashok Cordia wants her to have a chest ct however I do not see an order I can not precert until it is ordered and scheduled thanks libby

## 2016-05-31 NOTE — Telephone Encounter (Signed)
JN please advise on what CT chest needs to be ordered.  Thanks!

## 2016-06-04 NOTE — Telephone Encounter (Signed)
She should have a CT of her chest and neck ordered with contrast as per the result note from her Fluoroscopy/Sniff Test. Thanks.

## 2016-06-05 ENCOUNTER — Other Ambulatory Visit (INDEPENDENT_AMBULATORY_CARE_PROVIDER_SITE_OTHER): Payer: Medicare Other

## 2016-06-05 DIAGNOSIS — R05 Cough: Secondary | ICD-10-CM

## 2016-06-05 DIAGNOSIS — E785 Hyperlipidemia, unspecified: Secondary | ICD-10-CM | POA: Diagnosis not present

## 2016-06-05 DIAGNOSIS — R059 Cough, unspecified: Secondary | ICD-10-CM

## 2016-06-05 DIAGNOSIS — J986 Disorders of diaphragm: Secondary | ICD-10-CM

## 2016-06-05 LAB — BASIC METABOLIC PANEL
BUN: 21 mg/dL (ref 6–23)
CHLORIDE: 104 meq/L (ref 96–112)
CO2: 29 meq/L (ref 19–32)
Calcium: 9.2 mg/dL (ref 8.4–10.5)
Creatinine, Ser: 0.74 mg/dL (ref 0.40–1.20)
GFR: 80 mL/min (ref >60.00)
GLUCOSE: 114 mg/dL — AB (ref 70–99)
POTASSIUM: 3.9 meq/L (ref 3.5–5.1)
SODIUM: 139 meq/L (ref 135–145)

## 2016-06-05 NOTE — Telephone Encounter (Signed)
lmtcb x1 for pt. 

## 2016-06-05 NOTE — Telephone Encounter (Signed)
Patient returning call - she can be reached at [phone]

## 2016-06-05 NOTE — Telephone Encounter (Signed)
I spoke with patient and she requested both CTs be scheduled after the Thanksgiving holiday.  Pt scheduled 06/19/16 arrival @9 :45 am @[facility] .  Pt is aware of the appt & to be NPO food 2 hrs prior to CTs.

## 2016-06-05 NOTE — Telephone Encounter (Signed)
Spoke with patient, aware that we will get these scheduled as soon as possible. Per [facility], the [facility] cannot see the orders that were placed yesterday which is why they have not been scheduled yet. The orders were not signed in the computer and were pending in the telephone encounter. Confirmed by libby that orders can now be seen. Pt aware that someone will be calling to schedule these scans. Will send to [facility] to ensure this is taken care of. Thanks.

## 2016-06-05 NOTE — Telephone Encounter (Signed)
BMET lab order, pt coming by today to have this done.  Ivor Reining is working on getting CT scheduled  Will send to [facility] as FYI to contact the patient on cell phone# [phone] (M)

## 2016-06-06 ENCOUNTER — Ambulatory Visit: Payer: Medicare Other | Admitting: Pulmonary Disease

## 2016-06-06 ENCOUNTER — Ambulatory Visit (INDEPENDENT_AMBULATORY_CARE_PROVIDER_SITE_OTHER): Payer: Medicare Other | Admitting: Pulmonary Disease

## 2016-06-06 ENCOUNTER — Telehealth: Payer: Self-pay | Admitting: Pulmonary Disease

## 2016-06-06 DIAGNOSIS — J329 Chronic sinusitis, unspecified: Secondary | ICD-10-CM

## 2016-06-06 DIAGNOSIS — J45991 Cough variant asthma: Secondary | ICD-10-CM | POA: Diagnosis not present

## 2016-06-06 DIAGNOSIS — J986 Disorders of diaphragm: Secondary | ICD-10-CM

## 2016-06-06 LAB — PULMONARY FUNCTION TEST
DL/VA % PRED: 94 %
DL/VA: 4.4 ml/min/mmHg/L
DLCO COR % PRED: 75 %
DLCO cor: 17.24 ml/min/mmHg
DLCO unc % pred: 78 %
DLCO unc: 18.08 ml/min/mmHg
FEF 25-75 POST: 1.24 L/s
FEF 25-75 Pre: 0.97 L/sec
FEF2575-%CHANGE-POST: 28 %
FEF2575-%Pred-Post: 96 %
FEF2575-%Pred-Pre: 75 %
FEV1-%Change-Post: 6 %
FEV1-%Pred-Post: 89 %
FEV1-%Pred-Pre: 84 %
FEV1-POST: 1.62 L
FEV1-Pre: 1.52 L
FEV1FVC-%CHANGE-POST: 8 %
FEV1FVC-%Pred-Pre: 95 %
FEV6-%Change-Post: -2 %
FEV6-%PRED-PRE: 94 %
FEV6-%Pred-Post: 91 %
FEV6-PRE: 2.16 L
FEV6-Post: 2.1 L
FEV6FVC-%PRED-PRE: 106 %
FEV6FVC-%Pred-Post: 106 %
FVC-%CHANGE-POST: -2 %
FVC-%PRED-POST: 86 %
FVC-%PRED-PRE: 88 %
FVC-POST: 2.11 L
FVC-PRE: 2.16 L
POST FEV6/FVC RATIO: 100 %
PRE FEV1/FVC RATIO: 70 %
Post FEV1/FVC ratio: 77 %
Pre FEV6/FVC Ratio: 100 %
RV % pred: 93 %
RV: 2.22 L
TLC % PRED: 90 %
TLC: 4.43 L

## 2016-06-06 NOTE — Telephone Encounter (Signed)
Patient came up and stated she had to leave and asked [place] to call her.

## 2016-06-06 NOTE — Progress Notes (Signed)
PFT 06/06/16: FVC 2.16 L (88%) FEV1 1.52 L (84%) FEV1/FVC 0.70 FEF 25-75 0.97 L (75%) negative bronchodilator response TLC 4.43 L (90%) RV 93% ERV 58% DLCO corrected 75% (Hgb 15.1)

## 2016-06-06 NOTE — Telephone Encounter (Signed)
LMTCB

## 2016-06-07 NOTE — Telephone Encounter (Signed)
Attempted to contact pt. Line rang busy x2. Will try back. 

## 2016-06-07 NOTE — Telephone Encounter (Signed)
Patient is returning phone call.Marland KitchenMarland KitchenContact [phone].Marland KitchenMearl Sims

## 2016-06-10 NOTE — Telephone Encounter (Signed)
LMOM TCB x1 Unfortunately we do not have any Dulera 100 samples

## 2016-06-10 NOTE — Telephone Encounter (Signed)
Pt returning call and can be reached on cell @ [phone].Cassandra Sims

## 2016-06-10 NOTE — Telephone Encounter (Signed)
Spoke with patient-she is not made or upset;she is aware that we do not have samples of Dulera at this time. Pt will check with [place] again at her 07-03-16 appt with JN. Nothing further needed at this time.

## 2016-06-10 NOTE — Telephone Encounter (Signed)
lmtcb for pt.  

## 2016-06-10 NOTE — Telephone Encounter (Signed)
Patient returned phone call, advised patient do not have any Dulera samples.Patient states ok and hung up.Minette Brine # [phone].Marland KitchenMearl Latin

## 2016-06-11 ENCOUNTER — Telehealth: Payer: Self-pay | Admitting: Pulmonary Disease

## 2016-06-11 NOTE — Telephone Encounter (Signed)
I'm fine with that but not sure what kind of an X-ray she would have for "itching".

## 2016-06-11 NOTE — Telephone Encounter (Signed)
Spoke with pt, states that she has a ct chest on 11/28 and will just get this instead of a cxr as she is unsure why a cxr would be needed for itching also.  Nothing further needed.

## 2016-06-11 NOTE — Telephone Encounter (Signed)
JN Please advise  This pt. Called in saying that she spoke with her PCP and they wanted her to have a cxr for the itching. She wanted to know that since she is already coming to [facility] for her CT scan on 06/19/16, if she can have an order placed for her to have the xray done here at our office that same day or when she comes in for her follow up appointment with you on 07/03/16.

## 2016-06-19 ENCOUNTER — Ambulatory Visit (INDEPENDENT_AMBULATORY_CARE_PROVIDER_SITE_OTHER)
Admission: RE | Admit: 2016-06-19 | Discharge: 2016-06-19 | Disposition: A | Discharge status: Home / Self Care | Payer: Medicare Other | Source: Ambulatory Visit | Attending: Pulmonary Disease | Admitting: Pulmonary Disease

## 2016-06-19 DIAGNOSIS — R059 Cough, unspecified: Secondary | ICD-10-CM

## 2016-06-19 DIAGNOSIS — R911 Solitary pulmonary nodule: Secondary | ICD-10-CM | POA: Insufficient documentation

## 2016-06-19 DIAGNOSIS — J986 Disorders of diaphragm: Secondary | ICD-10-CM

## 2016-06-19 DIAGNOSIS — R05 Cough: Secondary | ICD-10-CM

## 2016-06-19 MED ORDER — IOPAMIDOL (ISOVUE-300) INJECTION 61%
80.0000 mL | Freq: Once | INTRAVENOUS | Status: AC | PRN
  Administered 2016-06-19: 80 mL via INTRAVENOUS

## 2016-06-24 DIAGNOSIS — M25561 Pain in right knee: Secondary | ICD-10-CM | POA: Diagnosis not present

## 2016-06-25 ENCOUNTER — Telehealth: Payer: Self-pay

## 2016-06-25 ENCOUNTER — Telehealth: Payer: Self-pay | Admitting: Pulmonary Disease

## 2016-06-25 NOTE — Telephone Encounter (Signed)
JN  Please advise  Pt. Called for PFT results.

## 2016-06-25 NOTE — Progress Notes (Signed)
LMTCB

## 2016-06-26 NOTE — Telephone Encounter (Signed)
Pt was returning a call for results. She was already spoken to. Nothing further was needed.

## 2016-06-26 NOTE — Telephone Encounter (Signed)
Results have been explained to patient, pt expressed understanding. Nothing further needed.  

## 2016-06-26 NOTE — Telephone Encounter (Signed)
We will review them at her appointment on 12/13. They were normal.   PFT 06/06/16:  FVC 2.16 L (88%) FEV1 1.52 L (84%) FEV1/FVC 0.70 FEF 25-75 0.97 L (75%) negative bronchodilator response TLC 4.43 L (90%) RV 93% ERV 58% DLCO corrected 75% (Hgb 15.1)

## 2016-07-01 DIAGNOSIS — H401113 Primary open-angle glaucoma, right eye, severe stage: Secondary | ICD-10-CM | POA: Diagnosis not present

## 2016-07-01 DIAGNOSIS — H401122 Primary open-angle glaucoma, left eye, moderate stage: Secondary | ICD-10-CM | POA: Diagnosis not present

## 2016-07-03 ENCOUNTER — Ambulatory Visit (INDEPENDENT_AMBULATORY_CARE_PROVIDER_SITE_OTHER): Payer: Medicare Other | Admitting: Pulmonary Disease

## 2016-07-03 ENCOUNTER — Encounter: Payer: Self-pay | Admitting: Pulmonary Disease

## 2016-07-03 VITALS — BP 106/80 | HR 66 | Ht 62.5 in | Wt 121.6 lb

## 2016-07-03 DIAGNOSIS — K219 Gastro-esophageal reflux disease without esophagitis: Secondary | ICD-10-CM | POA: Diagnosis not present

## 2016-07-03 DIAGNOSIS — J329 Chronic sinusitis, unspecified: Secondary | ICD-10-CM

## 2016-07-03 DIAGNOSIS — J45991 Cough variant asthma: Secondary | ICD-10-CM | POA: Diagnosis not present

## 2016-07-03 DIAGNOSIS — J986 Disorders of diaphragm: Secondary | ICD-10-CM

## 2016-07-03 DIAGNOSIS — R911 Solitary pulmonary nodule: Secondary | ICD-10-CM

## 2016-07-03 MED ORDER — FAMOTIDINE 20 MG PO TABS: 20.0000 mg | ORAL_TABLET | Freq: Two times a day (BID) | ORAL | 6 refills | Status: DC

## 2016-07-03 NOTE — Progress Notes (Signed)
Subjective:    Patient ID: Cassandra Sims, female    DOB: 12/24/1932, 82 y.o.   MRN: [id number]  C.C.:  Follow-up for Cough Variant Asthma, Chronic Sinusitis, Left Lung Nodule, Right Hemidiaphragm Paralysis, & GERD.  HPI Cough Variant Asthma:  Also has some suggestion of emphysema. Prescribe Singulair and Dulera. She still coughs intermittent. She reports only mild wheezing. Hasn't needed her rescue inhaler. No exacerbations since last appointment. No nocturnal awakenings with coughig.  Chronic Sinusitis:  Prescribed Singulair along with Claritin. Noted again on CT of the neck performed in November. Continues to have sinus drainage that is varying in color. No sinus pain or pressure.  Right Lung Nodule: Measures 6 mm. Seen on CT scan 06/19/16.  Left Hemidiaphgram Paralysis:  No findings on CT scan of neck or chest that explains the findings on her sniff test.   GERD:  Seen on Esophagram and noted as moderate. Patient reports she had itching while taking Zantac. Has been taking Acidophilus. Denies any reflux or dyspepsia. Does have frequent eructation when laying recumbent. No morning brash water taste.  Review of Systems No fever, chills, or sweats. No chest pain or pressure. No nausea or emesis.   Allergies  Allergen Reactions  . Penicillins Shortness Of Breath  . Adhesive [Tape] Itching  . Clindamycin/Lincomycin Itching  . Codeine Nausea And Vomiting  . Darvocet [Propoxyphene N-Acetaminophen]     HTN  . Dilaudid [Hydromorphone Hcl] Nausea And Vomiting  . Flagyl [Metronidazole]   . Lactose Intolerance (Gi)   . Levofloxacin     Joint pain  . Macrodantin [Nitrofurantoin Macrocrystal]   . Morphine And Related     hallucinate  . Premarin [Conjugated Estrogens] Itching  . Zantac [Ranitidine Hcl]     Itching   . Zofran [Ondansetron Hcl]     HTN  . Nitrofuran Derivatives Rash  . Sulfa Antibiotics Rash    Current Outpatient Prescriptions on File Prior to Visit  Medication Sig  Dispense Refill  . acetaminophen (TYLENOL) 325 MG tablet Per bottle as needed for pain    . albuterol (PROAIR HFA) 108 (90 BASE) MCG/ACT inhaler Inhale 2 puffs into the lungs every 4 (four) hours as needed for wheezing or shortness of breath. 1 Inhaler 3  . albuterol (PROVENTIL HFA;VENTOLIN HFA) 108 (90 BASE) MCG/ACT inhaler Inhale 2 puffs into the lungs every 4 (four) hours as needed for wheezing. Reported on 10/10/2015    . amLODipine (NORVASC) 10 MG tablet Take 5 mg by mouth daily.     . calcium-vitamin D (OSCAL WITH D) 500-200 MG-UNIT per tablet Take 1 tablet by mouth 2 (two) times daily.     . cholecalciferol (VITAMIN D) 1000 UNITS tablet Take 1,000 Units by mouth daily.    Marland Kitchen guaiFENesin (MUCINEX) 600 MG 12 hr tablet Reported on 10/10/2015    . lactobacillus acidophilus (BACID) TABS Take 1 tablet by mouth daily.     Marland Kitchen loratadine (CLARITIN) 10 MG tablet Take 10 mg by mouth daily.    . Misc Natural Products (LUTEIN 20) CAPS Take 1 capsule by mouth every morning.    . mometasone-formoterol (DULERA) 100-5 MCG/ACT AERO Take 2 puffs first thing in am and then another 2 puffs about 12 hours later. (Patient taking differently: Take 1 puff first thing in am and then another 1 puff about 12 hours later.) 3 Inhaler 3  . montelukast (SINGULAIR) 10 MG tablet Take 10 mg by mouth at bedtime.    . Multiple Vitamins-Minerals (CENTRUM SILVER  PO) Take 1 tablet by mouth daily.     . psyllium (METAMUCIL) 58.6 % powder 1 tsp every morning    . telmisartan (MICARDIS) 40 MG tablet Take 40 mg by mouth daily.     No current facility-administered medications on file prior to visit.     Past Medical History:  Diagnosis Date  . Arthritis   . Asthma   . Complication of anesthesia    hard to wake up  . GERD (gastroesophageal reflux disease)   . Glaucoma   . HOH (hard of hearing)   . Hypertension   . Multiple allergies   . Peripheral vascular disease (HCC)    varicose veins   . PONV (postoperative nausea and  vomiting)   . Rhinitis   . Wears glasses     Past Surgical History:  Procedure Laterality Date  . ABDOMINAL HYSTERECTOMY    . COLECTOMY  2008   obstruction  . DIAGNOSTIC LAPAROSCOPY     exp  . DILATION AND CURETTAGE OF UTERUS    . FUNCTIONAL ENDOSCOPIC SINUS SURGERY  2009   bilat ethm,frontal,max  . NASAL SINUS SURGERY Bilateral 10/26/2012   Procedure: BILATERAL ENDOSCOPIC REVISION ETHMOID MAXILLARY AND FRONTAL SINUS SURGERY  ;  Surgeon: Izora Gala, MD;  Location: [place];  Service: ENT;  Laterality: Bilateral;  . OVARIAN CYST SURGERY    . TUBAL LIGATION    . URETER SURGERY     cut accidentally during exp lap-ovarien cyst    Family History  Problem Relation Age of Onset  . Glaucoma Mother   . Arthritis Mother   . Lung disease Sister   . Breast cancer Daughter   . Diabetes Mellitus I Other     Social History   Social History  . Marital status: Divorced    Spouse name: N/A  . Number of children: N/A  . Years of education: N/A   Social History Main Topics  . Smoking status: Never Smoker  . Smokeless tobacco: Never Used  . Alcohol use No  . Drug use: No  . Sexual activity: Not Asked   Other Topics Concern  . None   Social History Narrative   Originally from [place]. Previously lived in [place] for 14 months. 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 No bird, mold, or hot tub exposure.       Objective:   Physical Exam BP 106/80 (BP Location: Left Arm, Cuff Size: Normal)   Pulse 66   Ht 5' 2.5" (1.588 m)   Wt 121 lb 9.6 oz (55.2 kg)   SpO2 96%   BMI 21.89 kg/m  General:  Awake. Alert. Comfortable. Integument:  Warm & dry. No rash on exposed skin.  HEENT:  Moist mucus membranes. No oral ulcers. No scleral injection.  Cardiovascular:  Regular rate. No edema. Normal S1 & S2. Pulmonary:  There was auscultation. Normal work of breathing on room air. Speaking in complete sentences. Abdomen: Soft. Normal bowel sounds.  Nontender. Musculoskeletal:  Normal bulk and tone. No joint deformity or effusion appreciated.  PFT 06/06/16: FVC 2.16 L (88%) FEV1 1.52 L (84%) FEV1/FVC 0.70 FEF 25-75 0.97 L (75%) negative bronchodilator response TLC 4.43 L (90%) RV 93% ERV 58% DLCO corrected 75% (Hgb 15.1)  08/04/12: FVC 2.81 L (110%) FEV1 1.87 L (107%) FEV1/FVC 0.67 FEF 25-75 0.16 L (57%) negative bronchodilator response TLC 4.33 L (95%) RV 79% ERV 87% DLCO uncorrected 93%  IMAGING CT CHEST W/ CONTRAST 06/19/16 (personally reviewed by  me):  Mild centrilobular emphysema. 6 mm nodule along the minor fissure likely subpleural lymph node. Calcified lung nodules also noted. No pleural effusion or thickening. No pericardial effusion. No pathologic mediastinal adenopathy. Low attenuation lesions in the liver measuring up to 2.3 cm likely cysts.  CT NECK/SOFT TISSUE W/ CONTRAST 06/19/16 (per radiologist):  Negative neck soft tissues. Generalized arterial tortuosity in the head and neck, but mild for age atherosclerosis. No left carotid space abnormality. Acute on chronic paranasal sinusitis status post maxillary antrostomies and ethmoidectomies. Symmetric nasal cavity mucosal thickening raising the possibility of rhinitis.   BARIUM SWALLOW/ESOPHAGRAM 04/05/16 (per radiologist):  Moderate gastroesophageal reflux. No esophageal dysmotility, stricture or mass.  SNIFF TEST 04/05/16 (per radiologist):  Mild paradoxical movement of the diaphragms with forceful inhalation is consistent with paralysis of the LEFT hemidiaphragm. Chronic elevation of LEFT diaphragm.  CXR PA/LAT 09/19/15 (previously reviewed by me):  Chronic elevation of left hemidiaphragm. No focal opacity. No effusion. Normal mediastinal contour and heart size.   LABS 03/29/16 CBC:  8.6/13.8/40.6/232 Eos:  0.6 IgE:  14 RAST Panel:  Negative     Assessment & Plan:  82 y.o. female previously followed for her chronic sinusitis and cough variant asthma. Patient does have minimal  emphysema seen on CT imaging. Also patient has a 6 mm right lung nodule which needs follow-up to ensure continuous current medical stability. We did discuss her left hemidiaphragm paralysis and findings from her neck and chest CT today. I discussed referral to neurology but the patient wishes to hold off at this time which I feel is quite reasonable. Continuing on her current dose of Dulera given her clinical stability and no evidence of significant bilateral response. I instructed the patient contact my office if she had any new breathing problems or questions before next appointment.  1. Cough Variant Asthma: Continuing Dulera 1 puff twice daily and Singulair. No changes at this time. 2. Chronic Sinusitis: Patient continuing on Claritin and Singulair along with nasal saline rinse. No changes. 3. Right Lung Nodule:  Repeat CT Chest w/o May 2018. 4. Left Hemidiaphragm Paralysis:  Patient declines referral to Neurology. 5. GERD:  Tying patient on Pepcid 20mg  qhs.  6. Health Maintenance: S/P Influenza Vaccine November 2017, Prevnar November 2016, Pneumovax May 2010, & Tdap October 2012. 7. Follow-up: Patient to return to clinic in 6 months or sooner if needed.  Sonia Baller Ashok Cordia, M.D. [facility] Pager:  [phone] After 3pm or if no response, call [phone] 10:24 AM 07/03/16

## 2016-07-03 NOTE — Patient Instructions (Signed)
   Continue taking your Dulera 1 puff twice daily.  We are going to try you on Pepcid to help with the reflux we saw. If you have any itching stop it and let me know.  I will see you back in 6 months after your CT scan. Call me if you have any new breathing problems before then.  TESTS ORDERED: 1. CT CHEST W/O May 2018

## 2016-07-12 DIAGNOSIS — M25561 Pain in right knee: Secondary | ICD-10-CM | POA: Diagnosis not present

## 2016-07-19 DIAGNOSIS — M810 Age-related osteoporosis without current pathological fracture: Secondary | ICD-10-CM | POA: Diagnosis not present

## 2016-08-01 DIAGNOSIS — N952 Postmenopausal atrophic vaginitis: Secondary | ICD-10-CM | POA: Diagnosis not present

## 2016-08-01 DIAGNOSIS — N898 Other specified noninflammatory disorders of vagina: Secondary | ICD-10-CM | POA: Diagnosis not present

## 2016-08-01 DIAGNOSIS — D493 Neoplasm of unspecified behavior of breast: Secondary | ICD-10-CM | POA: Diagnosis not present

## 2016-08-01 DIAGNOSIS — Z01419 Encounter for gynecological examination (general) (routine) without abnormal findings: Secondary | ICD-10-CM | POA: Diagnosis not present

## 2016-08-01 DIAGNOSIS — N63 Unspecified lump in unspecified breast: Secondary | ICD-10-CM | POA: Diagnosis not present

## 2016-08-12 ENCOUNTER — Ambulatory Visit: Payer: Medicare Other | Attending: Orthopaedic Surgery | Admitting: Physical Therapy

## 2016-08-12 ENCOUNTER — Encounter: Payer: Self-pay | Admitting: Physical Therapy

## 2016-08-12 DIAGNOSIS — G8929 Other chronic pain: Secondary | ICD-10-CM | POA: Diagnosis not present

## 2016-08-12 DIAGNOSIS — M25511 Pain in right shoulder: Secondary | ICD-10-CM | POA: Insufficient documentation

## 2016-08-12 DIAGNOSIS — M25661 Stiffness of right knee, not elsewhere classified: Secondary | ICD-10-CM | POA: Diagnosis not present

## 2016-08-12 DIAGNOSIS — M25561 Pain in right knee: Secondary | ICD-10-CM | POA: Insufficient documentation

## 2016-08-12 DIAGNOSIS — R262 Difficulty in walking, not elsewhere classified: Secondary | ICD-10-CM | POA: Diagnosis not present

## 2016-08-12 NOTE — Therapy (Signed)
[place], [place], [address] Phone: [phone]   Fax:  [phone]  Physical Therapy Evaluation  Patient Details  Name: Cassandra Sims MRN: [id number] Date of Birth: 1933/06/10 Referring Provider: Rhona Raider  Encounter Date: 08/12/2016      PT End of Session - 08/12/16 0942    Visit Number 1   Date for PT Re-Evaluation 10/10/16   PT Start Time 0920   PT Stop Time 1015   PT Time Calculation (min) 55 min   Activity Tolerance Patient tolerated treatment well   Behavior During Therapy [facility] for tasks assessed/performed      Past Medical History:  Diagnosis Date  . Arthritis   . Asthma   . Complication of anesthesia    hard to wake up  . GERD (gastroesophageal reflux disease)   . Glaucoma   . HOH (hard of hearing)   . Hypertension   . Multiple allergies   . Peripheral vascular disease (HCC)    varicose veins   . PONV (postoperative nausea and vomiting)   . Rhinitis   . Wears glasses     Past Surgical History:  Procedure Laterality Date  . ABDOMINAL HYSTERECTOMY    . COLECTOMY  2008   obstruction  . DIAGNOSTIC LAPAROSCOPY     exp  . DILATION AND CURETTAGE OF UTERUS    . FUNCTIONAL ENDOSCOPIC SINUS SURGERY  2009   bilat ethm,frontal,max  . NASAL SINUS SURGERY Bilateral 10/26/2012   Procedure: BILATERAL ENDOSCOPIC REVISION ETHMOID MAXILLARY AND FRONTAL SINUS SURGERY  ;  Surgeon: Izora Gala, MD;  Location: [place];  Service: ENT;  Laterality: Bilateral;  . OVARIAN CYST SURGERY    . TUBAL LIGATION    . URETER SURGERY     cut accidentally during exp lap-ovarien cyst    There were no vitals filed for this visit.       Subjective Assessment - 08/12/16 0919    Subjective Patient reports that she has been having right knee, elbow and shoulder pain.  She reports that the knee hurts the worst.  Reports OA is the cause.  She reports that she has had some pain off and on  for about a year.  She took the 5 injections in the right knee with little benefit from pain relief.     Limitations Standing;House hold activities   Patient Stated Goals have less pain   Currently in Pain? Yes   Pain Score 7    Pain Location Knee  right elbow and shoulder hurt as well   Pain Orientation Right   Pain Descriptors / Indicators Aching   Pain Type Chronic pain   Pain Onset More than a month ago   Pain Frequency Constant   Aggravating Factors  use of the right arm, walking and stairs.  pain up to 8-9/10   Pain Relieving Factors rest pain at best a 5/10   Effect of Pain on Daily Activities can't squat, difficulty getting up and down, can't do my normal ADL's            [facility] PT Assessment - 08/12/16 0001      Assessment   Medical Diagnosis right knee, elbow and sholder pain   Referring Provider [place]   Onset Date/Surgical Date 07/12/16   Hand Dominance Right   Prior Therapy no     Precautions   Precautions None     Balance Screen   Has the patient  fallen in the past 6 months No   Has the patient had a decrease in activity level because of a fear of falling?  No   Is the patient reluctant to leave their home because of a fear of falling?  No     Home Environment   Additional Comments does her own housework, light yardwork     Prior Function   Level of Independence Independent   Vocation Retired   Leisure some walking     Mining engineer Comments fwd head, rounded shoulders     ROM / Strength   AROM / PROM / Strength AROM;Strength     AROM   Overall AROM Comments Right knee AROM 11-104 degrees flexion with pain, right elbow AROM WNL's with minimal pain, AROM of the shoulder Flexion and abduction 120 degrees, ER and IR 65 degrees with minimal pain     Strength   Overall Strength Comments right knee 4-/5, right elbow 4/5, right shoulder 4-/5 with some pain for all MMT     Flexibility   Soft Tissue Assessment /Muscle Length --   very tight HS, calf, ITB and piriformis     Palpation   Palpation comment she is tender in the right shin, ITB and HS, mild crepitus peripatellar with SAQ     Ambulation/Gait   Gait Comments valgus of the right knee is > left.  Antalgic on the right                   [facility] Adult PT Treatment/Exercise - 08/12/16 0001      Modalities   Modalities Electrical Stimulation;Moist Heat     Moist Heat Therapy   Number Minutes Moist Heat 15 Minutes   Moist Heat Location Shoulder;Knee     Electrical Stimulation   Electrical Stimulation Location right knee   Electrical Stimulation Action IFC   Electrical Stimulation Parameters supine   Electrical Stimulation Goals Pain                PT Education - 08/12/16 [phone]    Education provided Yes   Education Details HS, calf stretches   Person(s) Educated Patient   Methods Explanation;Demonstration;Handout   Comprehension Verbalized understanding          PT Short Term Goals - 08/12/16 0945      PT SHORT TERM GOAL #1   Title independent with initial HEP   Time 2   Period Weeks   Status New           PT Long Term Goals - 08/12/16 0945      PT LONG TERM GOAL #1   Title independent with advanced HEP   Time 8   Period Weeks   Status New     PT LONG TERM GOAL #2   Title decrease pain 50% with activity   Time 8   Period Weeks   Status New     PT LONG TERM GOAL #3   Title increase right knee AROM to 0-115 degrees flexion   Time 8   Period Weeks   Status New     PT LONG TERM GOAL #4   Title get up and down without difficulty   Time 8   Period Weeks   Status New               Plan - 08/12/16 [id number]    Clinical Impression Statement Patient reports right knee, elbow and shoulder pain, she reports that the knee  is her worst pain, she reports that she took the series of 5 injections with little help, she reports OA is the cause of her pain.  She has limited ROM in the knee and the shoulder, she  is very tight in the HS, calf, ITB and piriformis of the right LE.   Rehab Potential Good   PT Frequency 2x / week   PT Duration 8 weeks   PT Treatment/Interventions ADLs/Self Care Home Management;Cryotherapy;Electrical Stimulation;Iontophoresis 4mg /ml Dexamethasone;Moist Heat;Ultrasound;Therapeutic activities;Therapeutic exercise;Patient/family education;Manual techniques   PT Next Visit Plan slowly add flexibility and exercises   Consulted and Agree with Plan of Care Patient      Patient will benefit from skilled therapeutic intervention in order to improve the following deficits and impairments:  Decreased mobility, Decreased strength, Decreased range of motion, Difficulty walking, Impaired flexibility, Pain, Increased muscle spasms  Visit Diagnosis: Chronic pain of right knee - Plan: PT plan of care cert/re-cert  Stiffness of right knee, not elsewhere classified - Plan: PT plan of care cert/re-cert  Difficulty in walking, not elsewhere classified - Plan: PT plan of care cert/re-cert  Chronic right shoulder pain - Plan: PT plan of care cert/re-cert      G-Codes - [id number]    Functional Assessment Tool Used foto 66% limitation   Functional Limitation Self care   Self Care Current Status [id number]) At least 60 percent but less than 80 percent impaired, limited or restricted   Self Care Goal Status [id number]) At least 40 percent but less than 60 percent impaired, limited or restricted       Problem List Patient Active Problem List   Diagnosis Date Noted  . GERD (gastroesophageal reflux disease) 07/03/2016  . Hemidiaphragm paralysis 07/03/2016  . Nodule of right lung 06/19/2016  . Essential hypertension 03/29/2016  . Glaucoma 03/29/2016  . Arthritis 03/29/2016  . Sinusitis, chronic 11/25/2012  . Cough variant asthma 11/25/2012  . Cough 11/24/2012    Sumner Boast., PT 08/12/2016, 9:49 AM  [place], [place], [address] Phone: [phone]   Fax:  [phone]  Name: BEONCA NEWSUM MRN: [id number] Date of Birth: 11-30-1932

## 2016-08-13 DIAGNOSIS — N6489 Other specified disorders of breast: Secondary | ICD-10-CM | POA: Diagnosis not present

## 2016-08-13 DIAGNOSIS — N6310 Unspecified lump in the right breast, unspecified quadrant: Secondary | ICD-10-CM | POA: Diagnosis not present

## 2016-08-13 DIAGNOSIS — R229 Localized swelling, mass and lump, unspecified: Secondary | ICD-10-CM | POA: Diagnosis not present

## 2016-08-13 DIAGNOSIS — R928 Other abnormal and inconclusive findings on diagnostic imaging of breast: Secondary | ICD-10-CM | POA: Diagnosis not present

## 2016-08-15 ENCOUNTER — Ambulatory Visit: Payer: Medicare Other | Admitting: Physical Therapy

## 2016-08-15 ENCOUNTER — Encounter: Payer: Self-pay | Admitting: Physical Therapy

## 2016-08-15 DIAGNOSIS — G8929 Other chronic pain: Secondary | ICD-10-CM | POA: Diagnosis not present

## 2016-08-15 DIAGNOSIS — M25561 Pain in right knee: Principal | ICD-10-CM

## 2016-08-15 DIAGNOSIS — M25511 Pain in right shoulder: Secondary | ICD-10-CM | POA: Diagnosis not present

## 2016-08-15 DIAGNOSIS — R262 Difficulty in walking, not elsewhere classified: Secondary | ICD-10-CM

## 2016-08-15 DIAGNOSIS — M25661 Stiffness of right knee, not elsewhere classified: Secondary | ICD-10-CM | POA: Diagnosis not present

## 2016-08-15 NOTE — Therapy (Signed)
[place], [place], [address] Phone: [phone]   Fax:  [phone]  Physical Therapy Treatment  Patient Details  Name: Cassandra Sims MRN: [id number] Date of Birth: 04-May-1933 Referring Provider: Rhona Raider  Encounter Date: 08/15/2016      PT End of Session - 08/15/16 1056    Visit Number 2   Date for PT Re-Evaluation 10/10/16   PT Start Time 0930   PT Stop Time 1019   PT Time Calculation (min) 49 min   Activity Tolerance Patient tolerated treatment well   Behavior During Therapy [facility] for tasks assessed/performed      Past Medical History:  Diagnosis Date  . Arthritis   . Asthma   . Complication of anesthesia    hard to wake up  . GERD (gastroesophageal reflux disease)   . Glaucoma   . HOH (hard of hearing)   . Hypertension   . Multiple allergies   . Peripheral vascular disease (HCC)    varicose veins   . PONV (postoperative nausea and vomiting)   . Rhinitis   . Wears glasses     Past Surgical History:  Procedure Laterality Date  . ABDOMINAL HYSTERECTOMY    . COLECTOMY  2008   obstruction  . DIAGNOSTIC LAPAROSCOPY     exp  . DILATION AND CURETTAGE OF UTERUS    . FUNCTIONAL ENDOSCOPIC SINUS SURGERY  2009   bilat ethm,frontal,max  . NASAL SINUS SURGERY Bilateral 10/26/2012   Procedure: BILATERAL ENDOSCOPIC REVISION ETHMOID MAXILLARY AND FRONTAL SINUS SURGERY  ;  Surgeon: Izora Gala, MD;  Location: [place];  Service: ENT;  Laterality: Bilateral;  . OVARIAN CYST SURGERY    . TUBAL LIGATION    . URETER SURGERY     cut accidentally during exp lap-ovarien cyst    There were no vitals filed for this visit.      Subjective Assessment - 08/15/16 0937    Subjective Patient reports that the knee is hurting today, she c/o the cold weather and stiffness.   Currently in Pain? Yes   Pain Score 5    Pain Location Knee   Pain Orientation Right   Pain Descriptors /  Indicators Sore;Tightness   Aggravating Factors  bending the knee causes pain up to 7-8/10                         [facility] Adult PT Treatment/Exercise - 08/15/16 0001      Exercises   Exercises Knee/Hip     Knee/Hip Exercises: Stretches   Passive Hamstring Stretch 4 reps;20 seconds   Piriformis Stretch 3 reps;20 seconds   Gastroc Stretch 3 reps;20 seconds     Knee/Hip Exercises: Aerobic   Nustep Level 4 x 23minutes     Knee/Hip Exercises: Seated   Long Arc Quad 3 sets;10 reps   Long Arc Quad Weight 3 lbs.   Other Seated Knee/Hip Exercises red tband scapular stabilization   Hamstring Curl 3 sets;10 reps   Hamstring Limitations red tband     Modalities   Modalities Ultrasound     Moist Heat Therapy   Number Minutes Moist Heat 15 Minutes   Moist Heat Location Shoulder;Knee     Electrical Stimulation   Electrical Stimulation Location right knee   Electrical Stimulation Action IFC   Electrical Stimulation Parameters supine   Electrical Stimulation Goals Pain     Ultrasound   Ultrasound Location  right knee   Ultrasound Parameters 100% 1.4w/cm2   Ultrasound Goals Pain                  PT Short Term Goals - 08/12/16 0945      PT SHORT TERM GOAL #1   Title independent with initial HEP   Time 2   Period Weeks   Status New           PT Long Term Goals - 08/12/16 0945      PT LONG TERM GOAL #1   Title independent with advanced HEP   Time 8   Period Weeks   Status New     PT LONG TERM GOAL #2   Title decrease pain 50% with activity   Time 8   Period Weeks   Status New     PT LONG TERM GOAL #3   Title increase right knee AROM to 0-115 degrees flexion   Time 8   Period Weeks   Status New     PT LONG TERM GOAL #4   Title get up and down without difficulty   Time 8   Period Weeks   Status New               Plan - 08/15/16 1056    Clinical Impression Statement Patient without any increase of pain with exercises today.   Very tight calves and HS, she does not like the stretch, tends to stop the stretches.   PT Next Visit Plan slowly add flexibility and exercises   Consulted and Agree with Plan of Care Patient      Patient will benefit from skilled therapeutic intervention in order to improve the following deficits and impairments:  Decreased mobility, Decreased strength, Decreased range of motion, Difficulty walking, Impaired flexibility, Pain, Increased muscle spasms  Visit Diagnosis: Chronic pain of right knee  Stiffness of right knee, not elsewhere classified  Difficulty in walking, not elsewhere classified  Chronic right shoulder pain     Problem List Patient Active Problem List   Diagnosis Date Noted  . GERD (gastroesophageal reflux disease) 07/03/2016  . Hemidiaphragm paralysis 07/03/2016  . Nodule of right lung 06/19/2016  . Essential hypertension 03/29/2016  . Glaucoma 03/29/2016  . Arthritis 03/29/2016  . Sinusitis, chronic 11/25/2012  . Cough variant asthma 11/25/2012  . Cough 11/24/2012    Sumner Boast., PT 08/15/2016, 11:00 AM  [place], [place], [address] Phone: [phone]   Fax:  [phone]  Name: TANYSHA PARENTI MRN: [id number] Date of Birth: [birth year]

## 2016-08-19 ENCOUNTER — Encounter: Payer: Self-pay | Admitting: Physical Therapy

## 2016-08-19 ENCOUNTER — Ambulatory Visit: Payer: Medicare Other | Admitting: Physical Therapy

## 2016-08-19 DIAGNOSIS — M25661 Stiffness of right knee, not elsewhere classified: Secondary | ICD-10-CM

## 2016-08-19 DIAGNOSIS — R262 Difficulty in walking, not elsewhere classified: Secondary | ICD-10-CM | POA: Diagnosis not present

## 2016-08-19 DIAGNOSIS — M25561 Pain in right knee: Principal | ICD-10-CM

## 2016-08-19 DIAGNOSIS — M25511 Pain in right shoulder: Secondary | ICD-10-CM

## 2016-08-19 DIAGNOSIS — G8929 Other chronic pain: Secondary | ICD-10-CM

## 2016-08-19 NOTE — Therapy (Signed)
[place], [place], [address] Phone: [phone]   Fax:  [phone]  Physical Therapy Treatment  Patient Details  Name: Cassandra Sims MRN: [id number] Date of Birth: 11/22/1932 Referring Provider: Rhona Raider  Encounter Date: 08/19/2016      PT End of Session - 08/19/16 1156    Visit Number 3   Date for PT Re-Evaluation 10/10/16   PT Start Time 1020   PT Stop Time 1115   PT Time Calculation (min) 55 min   Activity Tolerance Patient tolerated treatment well   Behavior During Therapy [facility] for tasks assessed/performed      Past Medical History:  Diagnosis Date  . Arthritis   . Asthma   . Complication of anesthesia    hard to wake up  . GERD (gastroesophageal reflux disease)   . Glaucoma   . HOH (hard of hearing)   . Hypertension   . Multiple allergies   . Peripheral vascular disease (HCC)    varicose veins   . PONV (postoperative nausea and vomiting)   . Rhinitis   . Wears glasses     Past Surgical History:  Procedure Laterality Date  . ABDOMINAL HYSTERECTOMY    . COLECTOMY  2008   obstruction  . DIAGNOSTIC LAPAROSCOPY     exp  . DILATION AND CURETTAGE OF UTERUS    . FUNCTIONAL ENDOSCOPIC SINUS SURGERY  2009   bilat ethm,frontal,max  . NASAL SINUS SURGERY Bilateral 10/26/2012   Procedure: BILATERAL ENDOSCOPIC REVISION ETHMOID MAXILLARY AND FRONTAL SINUS SURGERY  ;  Surgeon: Izora Gala, MD;  Location: [place];  Service: ENT;  Laterality: Bilateral;  . OVARIAN CYST SURGERY    . TUBAL LIGATION    . URETER SURGERY     cut accidentally during exp lap-ovarien cyst    There were no vitals filed for this visit.      Subjective Assessment - 08/19/16 1027    Subjective Continues to report some increase of pain with the cold damp weather   Currently in Pain? Yes   Pain Score 5    Pain Location Knee   Pain Orientation Right                          [facility] Adult PT Treatment/Exercise - 08/19/16 0001      Knee/Hip Exercises: Stretches   Passive Hamstring Stretch 4 reps;20 seconds   Piriformis Stretch 3 reps;20 seconds   Gastroc Stretch 3 reps;20 seconds     Knee/Hip Exercises: Aerobic   Nustep Level 4 x 11minutes     Knee/Hip Exercises: Seated   Long Arc Quad 3 sets;10 reps   Long Arc Quad Weight 3 lbs.   Other Seated Knee/Hip Exercises red tband scapular stabilization   Hamstring Curl 3 sets;10 reps   Hamstring Limitations red tband     Knee/Hip Exercises: Supine   Short Arc Quad Sets Right;3 sets;10 reps   Short Arc Quad Sets Limitations 2.5#   Bridges with [employer] 10 reps   Other Supine Knee/Hip Exercises right leg pull down with blue tband     Moist Heat Therapy   Number Minutes Moist Heat 15 Minutes   Moist Heat Location Shoulder;Knee     Electrical Stimulation   Electrical Stimulation Location right knee   Electrical Stimulation Action IFC   Electrical Stimulation Parameters supine   Electrical Stimulation Goals Pain  PT Short Term Goals - 08/19/16 1157      PT SHORT TERM GOAL #1   Title independent with initial HEP   Status Achieved           PT Long Term Goals - 08/12/16 0945      PT LONG TERM GOAL #1   Title independent with advanced HEP   Time 8   Period Weeks   Status New     PT LONG TERM GOAL #2   Title decrease pain 50% with activity   Time 8   Period Weeks   Status New     PT LONG TERM GOAL #3   Title increase right knee AROM to 0-115 degrees flexion   Time 8   Period Weeks   Status New     PT LONG TERM GOAL #4   Title get up and down without difficulty   Time 8   Period Weeks   Status New               Plan - 08/19/16 1156    Clinical Impression Statement Patient LE is really tight and causes pain with stretches, she does not like the stretches and will usually stop the stretch due to the pain.   PT Next Visit Plan slowly add  flexibility and exercises   Consulted and Agree with Plan of Care Patient      Patient will benefit from skilled therapeutic intervention in order to improve the following deficits and impairments:  Decreased mobility, Decreased strength, Decreased range of motion, Difficulty walking, Impaired flexibility, Pain, Increased muscle spasms  Visit Diagnosis: Chronic pain of right knee  Stiffness of right knee, not elsewhere classified  Difficulty in walking, not elsewhere classified  Chronic right shoulder pain     Problem List Patient Active Problem List   Diagnosis Date Noted  . GERD (gastroesophageal reflux disease) 07/03/2016  . Hemidiaphragm paralysis 07/03/2016  . Nodule of right lung 06/19/2016  . Essential hypertension 03/29/2016  . Glaucoma 03/29/2016  . Arthritis 03/29/2016  . Sinusitis, chronic 11/25/2012  . Cough variant asthma 11/25/2012  . Cough 11/24/2012    Sumner Boast., PT 08/19/2016, 11:58 AM  [place], [place], [address] Phone: [phone]   Fax:  [phone]  Name: Cassandra Sims MRN: [id number] Date of Birth: 1933/02/21

## 2016-08-22 ENCOUNTER — Encounter: Payer: Self-pay | Admitting: Physical Therapy

## 2016-08-22 ENCOUNTER — Ambulatory Visit: Payer: Medicare Other | Attending: Orthopaedic Surgery | Admitting: Physical Therapy

## 2016-08-22 DIAGNOSIS — M25661 Stiffness of right knee, not elsewhere classified: Secondary | ICD-10-CM

## 2016-08-22 DIAGNOSIS — M25561 Pain in right knee: Secondary | ICD-10-CM | POA: Diagnosis not present

## 2016-08-22 DIAGNOSIS — M25511 Pain in right shoulder: Secondary | ICD-10-CM | POA: Diagnosis not present

## 2016-08-22 DIAGNOSIS — M810 Age-related osteoporosis without current pathological fracture: Secondary | ICD-10-CM | POA: Diagnosis not present

## 2016-08-22 DIAGNOSIS — R262 Difficulty in walking, not elsewhere classified: Secondary | ICD-10-CM | POA: Insufficient documentation

## 2016-08-22 DIAGNOSIS — G8929 Other chronic pain: Secondary | ICD-10-CM | POA: Diagnosis not present

## 2016-08-22 DIAGNOSIS — R2241 Localized swelling, mass and lump, right lower limb: Secondary | ICD-10-CM | POA: Insufficient documentation

## 2016-08-22 NOTE — Therapy (Signed)
[place], [place], [address] Phone: [phone]   Fax:  [phone]  Physical Therapy Treatment  Patient Details  Name: Cassandra Sims MRN: [id number] Date of Birth: 02-Jan-1933 Referring Provider: Rhona Raider  Encounter Date: 08/22/2016      PT End of Session - 08/22/16 1350    Visit Number 4   Date for PT Re-Evaluation 10/10/16   PT Start Time 1315   PT Stop Time 1412   PT Time Calculation (min) 57 min   Activity Tolerance Patient tolerated treatment well   Behavior During Therapy [facility] for tasks assessed/performed      Past Medical History:  Diagnosis Date  . Arthritis   . Asthma   . Complication of anesthesia    hard to wake up  . GERD (gastroesophageal reflux disease)   . Glaucoma   . HOH (hard of hearing)   . Hypertension   . Multiple allergies   . Peripheral vascular disease (HCC)    varicose veins   . PONV (postoperative nausea and vomiting)   . Rhinitis   . Wears glasses     Past Surgical History:  Procedure Laterality Date  . ABDOMINAL HYSTERECTOMY    . COLECTOMY  2008   obstruction  . DIAGNOSTIC LAPAROSCOPY     exp  . DILATION AND CURETTAGE OF UTERUS    . FUNCTIONAL ENDOSCOPIC SINUS SURGERY  2009   bilat ethm,frontal,max  . NASAL SINUS SURGERY Bilateral 10/26/2012   Procedure: BILATERAL ENDOSCOPIC REVISION ETHMOID MAXILLARY AND FRONTAL SINUS SURGERY  ;  Surgeon: Izora Gala, MD;  Location: [place];  Service: ENT;  Laterality: Bilateral;  . OVARIAN CYST SURGERY    . TUBAL LIGATION    . URETER SURGERY     cut accidentally during exp lap-ovarien cyst    There were no vitals filed for this visit.      Subjective Assessment - 08/22/16 1318    Subjective A little more pain the last few days in the right knee.  Reports that she took a meloxicam today.  REports that the back of the leg is feeling better   Currently in Pain? Yes   Pain Score 3    Pain  Location Knee   Pain Orientation Right   Pain Descriptors / Indicators Aching   Pain Relieving Factors The meloxicam seems to have helped                         [facility] Adult PT Treatment/Exercise - 08/22/16 0001      Knee/Hip Exercises: Stretches   Passive Hamstring Stretch 4 reps;20 seconds   Piriformis Stretch 3 reps;20 seconds   Gastroc Stretch 3 reps;20 seconds     Knee/Hip Exercises: Aerobic   Nustep Level 4 x 24minutes     Knee/Hip Exercises: Standing   Hip Abduction 10 reps;2 sets;Both   Abduction Limitations 2.5#     Knee/Hip Exercises: Seated   Long Arc Quad 3 sets;10 reps   Long Arc Quad Weight 3 lbs.   Other Seated Knee/Hip Exercises red tband scapular stabilization, then ER   Hamstring Curl 3 sets;10 reps     Knee/Hip Exercises: Supine   Short Arc Quad Sets Right;3 sets;10 reps   Short Arc Quad Sets Limitations 3#   Bridges with Ball Squeeze 10 reps   Other Supine Knee/Hip Exercises right leg pull down with blue tband     Modalities  Modalities Vasopneumatic     Acupuncturist Location right knee   Chartered certified accountant IFC   Electrical Stimulation Parameters supine   Electrical Stimulation Goals Pain     Vasopneumatic   Number Minutes Vasopneumatic  15 minutes   Vasopnuematic Location  Knee   Vasopneumatic Pressure Medium   Vasopneumatic Temperature  36                  PT Short Term Goals - 08/19/16 1157      PT SHORT TERM GOAL #1   Title independent with initial HEP   Status Achieved           PT Long Term Goals - 08/12/16 0945      PT LONG TERM GOAL #1   Title independent with advanced HEP   Time 8   Period Weeks   Status New     PT LONG TERM GOAL #2   Title decrease pain 50% with activity   Time 8   Period Weeks   Status New     PT LONG TERM GOAL #3   Title increase right knee AROM to 0-115 degrees flexion   Time 8   Period Weeks   Status New     PT LONG  TERM GOAL #4   Title get up and down without difficulty   Time 8   Period Weeks   Status New               Plan - 08/22/16 1350    Clinical Impression Statement The flexibility is much improved, her right knee is swollen and warm to the touch today.  She is still resistant to the stretches because "they hurt".   PT Next Visit Plan slowly add flexibility and exercises   Consulted and Agree with Plan of Care Patient      Patient will benefit from skilled therapeutic intervention in order to improve the following deficits and impairments:  Decreased mobility, Decreased strength, Decreased range of motion, Difficulty walking, Impaired flexibility, Pain, Increased muscle spasms  Visit Diagnosis: Chronic pain of right knee  Stiffness of right knee, not elsewhere classified  Difficulty in walking, not elsewhere classified  Localized swelling, mass and lump, right lower limb     Problem List Patient Active Problem List   Diagnosis Date Noted  . GERD (gastroesophageal reflux disease) 07/03/2016  . Hemidiaphragm paralysis 07/03/2016  . Nodule of right lung 06/19/2016  . Essential hypertension 03/29/2016  . Glaucoma 03/29/2016  . Arthritis 03/29/2016  . Sinusitis, chronic 11/25/2012  . Cough variant asthma 11/25/2012  . Cough 11/24/2012    Sumner Boast., PT 08/22/2016, 1:52 PM  [place], [place], [address] Phone: [phone]   Fax:  [phone]  Name: Cassandra Sims MRN: [id number] Date of Birth: 1932/08/04

## 2016-08-27 ENCOUNTER — Ambulatory Visit: Payer: Medicare Other | Admitting: Physical Therapy

## 2016-08-27 ENCOUNTER — Encounter: Payer: Self-pay | Admitting: Physical Therapy

## 2016-08-27 DIAGNOSIS — R2241 Localized swelling, mass and lump, right lower limb: Secondary | ICD-10-CM | POA: Diagnosis not present

## 2016-08-27 DIAGNOSIS — M25661 Stiffness of right knee, not elsewhere classified: Secondary | ICD-10-CM

## 2016-08-27 DIAGNOSIS — R262 Difficulty in walking, not elsewhere classified: Secondary | ICD-10-CM | POA: Diagnosis not present

## 2016-08-27 DIAGNOSIS — M25561 Pain in right knee: Secondary | ICD-10-CM | POA: Diagnosis not present

## 2016-08-27 DIAGNOSIS — G8929 Other chronic pain: Secondary | ICD-10-CM | POA: Diagnosis not present

## 2016-08-27 DIAGNOSIS — M25511 Pain in right shoulder: Secondary | ICD-10-CM | POA: Diagnosis not present

## 2016-08-27 NOTE — Therapy (Signed)
[facility] [address] [place], [place], [address] Phone: [phone]   Fax:  [phone]  Physical Therapy Treatment  Patient Details  Name: Cassandra Sims MRN: [id number] Date of Birth: 1932-11-02 Referring Provider: Jerl Santos  Encounter Date: 08/27/2016      PT End of Session - 08/27/16 1139    Visit Number 5   Date for PT Re-Evaluation 10/10/16   PT Start Time 1104   PT Stop Time 1158   PT Time Calculation (min) 54 min   Activity Tolerance Patient tolerated treatment well   Behavior During Therapy [facility] for tasks assessed/performed      Past Medical History:  Diagnosis Date  . Arthritis   . Asthma   . Complication of anesthesia    hard to wake up  . GERD (gastroesophageal reflux disease)   . Glaucoma   . HOH (hard of hearing)   . Hypertension   . Multiple allergies   . Peripheral vascular disease (HCC)    varicose veins   . PONV (postoperative nausea and vomiting)   . Rhinitis   . Wears glasses     Past Surgical History:  Procedure Laterality Date  . ABDOMINAL HYSTERECTOMY    . COLECTOMY  2008   obstruction  . DIAGNOSTIC LAPAROSCOPY     exp  . DILATION AND CURETTAGE OF UTERUS    . FUNCTIONAL ENDOSCOPIC SINUS SURGERY  2009   bilat ethm,frontal,max  . NASAL SINUS SURGERY Bilateral 10/26/2012   Procedure: BILATERAL ENDOSCOPIC REVISION ETHMOID MAXILLARY AND FRONTAL SINUS SURGERY  ;  Surgeon: Serena Colonel, MD;  Location: [facility];  Service: ENT;  Laterality: Bilateral;  . OVARIAN CYST SURGERY    . TUBAL LIGATION    . URETER SURGERY     cut accidentally during exp lap-ovarien cyst    There were no vitals filed for this visit.      Subjective Assessment - 08/27/16 1107    Subjective Patient reports that she stopped taking the meloxicam and is now having some increased pain   Currently in Pain? Yes   Pain Score 5    Pain Location Knee   Pain Orientation Right;Posterior   Pain  Descriptors / Indicators Aching   Pain Type Chronic pain   Aggravating Factors  not taking pain meds, was taking meloxicam            [facility] PT Assessment - 08/27/16 0001      AROM   Overall AROM Comments AROM right knee 6-112 degrees flexion                     [facility] Adult PT Treatment/Exercise - 08/27/16 0001      Knee/Hip Exercises: Stretches   Passive Hamstring Stretch 4 reps;20 seconds   Piriformis Stretch 3 reps;20 seconds   Gastroc Stretch 3 reps;20 seconds     Knee/Hip Exercises: Aerobic   Nustep Level 4 x     Knee/Hip Exercises: Standing   Hip Abduction 10 reps;2 sets;Both   Abduction Limitations 2.5#     Knee/Hip Exercises: Seated   Other Seated Knee/Hip Exercises red tband scapular stabilization, then ER   Hamstring Curl 3 sets;10 reps     Knee/Hip Exercises: Supine   Bridges with Ball Squeeze 10 reps   Other Supine Knee/Hip Exercises right leg pull down with blue tband     Electrical Stimulation   Electrical Stimulation Location right knee   Electrical Stimulation  Action IFC   Electrical Stimulation Parameters supine   Electrical Stimulation Goals Pain     Ultrasound   Ultrasound Location right knee    Ultrasound Parameters 100% 1.4w/cm2 1MHz   Ultrasound Goals Pain     Vasopneumatic   Number Minutes Vasopneumatic  15 minutes   Vasopnuematic Location  Knee   Vasopneumatic Pressure Medium   Vasopneumatic Temperature  36                  PT Short Term Goals - 08/19/16 1157      PT SHORT TERM GOAL #1   Title independent with initial HEP   Status Achieved           PT Long Term Goals - 08/27/16 1141      PT LONG TERM GOAL #2   Title decrease pain 50% with activity   Status On-going     PT LONG TERM GOAL #3   Title increase right knee AROM to 0-115 degrees flexion   Status Partially Met               Plan - 08/27/16 1140    Clinical Impression Statement Still does not like stretching,  resistant to that due to pain.  She continues to c/o a vague pain medial and lateral knee, posterior knee and into the shin area.  She is obviously tight in the mms.  Has some areas that are tender   PT Next Visit Plan slowly add flexibility and exercises, could try ionto   Consulted and Agree with Plan of Care Patient      Patient will benefit from skilled therapeutic intervention in order to improve the following deficits and impairments:  Decreased mobility, Decreased strength, Decreased range of motion, Difficulty walking, Impaired flexibility, Pain, Increased muscle spasms  Visit Diagnosis: Chronic pain of right knee  Stiffness of right knee, not elsewhere classified  Difficulty in walking, not elsewhere classified  Localized swelling, mass and lump, right lower limb     Problem List Patient Active Problem List   Diagnosis Date Noted  . GERD (gastroesophageal reflux disease) 07/03/2016  . Hemidiaphragm paralysis 07/03/2016  . Nodule of right lung 06/19/2016  . Essential hypertension 03/29/2016  . Glaucoma 03/29/2016  . Arthritis 03/29/2016  . Sinusitis, chronic 11/25/2012  . Cough variant asthma 11/25/2012  . Cough 11/24/2012    Sumner Boast., PT 08/27/2016, 11:43 AM  [place], [place], [address] Phone: [phone]   Fax:  [phone]  Name: ALAYJAH BOEHRINGER MRN: [id number] Date of Birth: 07-29-1932

## 2016-08-29 ENCOUNTER — Ambulatory Visit: Payer: Medicare Other | Admitting: Physical Therapy

## 2016-08-29 DIAGNOSIS — M25661 Stiffness of right knee, not elsewhere classified: Secondary | ICD-10-CM | POA: Diagnosis not present

## 2016-08-29 DIAGNOSIS — M25511 Pain in right shoulder: Secondary | ICD-10-CM | POA: Diagnosis not present

## 2016-08-29 DIAGNOSIS — G8929 Other chronic pain: Secondary | ICD-10-CM

## 2016-08-29 DIAGNOSIS — R262 Difficulty in walking, not elsewhere classified: Secondary | ICD-10-CM

## 2016-08-29 DIAGNOSIS — R2241 Localized swelling, mass and lump, right lower limb: Secondary | ICD-10-CM | POA: Diagnosis not present

## 2016-08-29 DIAGNOSIS — M25561 Pain in right knee: Principal | ICD-10-CM

## 2016-08-29 NOTE — Therapy (Signed)
[place], [place], [address] Phone: [phone]   Fax:  [phone]  Physical Therapy Treatment  Patient Details  Name: Cassandra Sims MRN: [id number] Date of Birth: 14-Aug-1932 Referring Provider: Rhona Raider  Encounter Date: 08/29/2016      PT End of Session - 08/29/16 1146    Visit Number 6   Date for PT Re-Evaluation 10/10/16   PT Start Time 1105   PT Stop Time 1150   PT Time Calculation (min) 45 min   Activity Tolerance Patient tolerated treatment well   Behavior During Therapy [facility] for tasks assessed/performed      Past Medical History:  Diagnosis Date  . Arthritis   . Asthma   . Complication of anesthesia    hard to wake up  . GERD (gastroesophageal reflux disease)   . Glaucoma   . HOH (hard of hearing)   . Hypertension   . Multiple allergies   . Peripheral vascular disease (HCC)    varicose veins   . PONV (postoperative nausea and vomiting)   . Rhinitis   . Wears glasses     Past Surgical History:  Procedure Laterality Date  . ABDOMINAL HYSTERECTOMY    . COLECTOMY  2008   obstruction  . DIAGNOSTIC LAPAROSCOPY     exp  . DILATION AND CURETTAGE OF UTERUS    . FUNCTIONAL ENDOSCOPIC SINUS SURGERY  2009   bilat ethm,frontal,max  . NASAL SINUS SURGERY Bilateral 10/26/2012   Procedure: BILATERAL ENDOSCOPIC REVISION ETHMOID MAXILLARY AND FRONTAL SINUS SURGERY  ;  Surgeon: Izora Gala, MD;  Location: [facility];  Service: ENT;  Laterality: Bilateral;  . OVARIAN CYST SURGERY    . TUBAL LIGATION    . URETER SURGERY     cut accidentally during exp lap-ovarien cyst    There were no vitals filed for this visit.      Subjective Assessment - 08/29/16 1108    Subjective Pt reporting pain today of 5/10 in her R knee. Pt also reporting she feels like the [place] at the last visit did not help and prefers to just try the E-stim today.    Limitations Standing;House hold  activities   Patient Stated Goals have less pain   Currently in Pain? Yes   Pain Score 5    Pain Location Knee   Pain Orientation Right;Medial   Pain Descriptors / Indicators Aching   Pain Type Chronic pain   Pain Onset More than a month ago   Pain Frequency Constant   Aggravating Factors  not taking pain meds, bending, walking, standing prolonged   Pain Relieving Factors Pain meds, ice   Effect of Pain on Daily Activities difficulty bending, transferring in/out of chair, difficulty with ADL's                         [facility] Adult PT Treatment/Exercise - 08/29/16 0001      Knee/Hip Exercises: Stretches   Passive Hamstring Stretch 4 reps;20 seconds   Piriformis Stretch 3 reps;20 seconds   Gastroc Stretch 3 reps;20 seconds     Knee/Hip Exercises: Aerobic   Nustep --  Reported increased pain at last session     Knee/Hip Exercises: Standing   Hip Abduction 10 reps;2 sets;Both     Knee/Hip Exercises: Sidelying   Clams 10 reps 2 sets     Modalities   Modalities Cryotherapy;Electrical Stimulation;Iontophoresis     Cryotherapy  Number Minutes Cryotherapy 20 Minutes   Cryotherapy Location Knee   Type of Cryotherapy Ice pack     Electrical Stimulation   Electrical Stimulation Location right knee   Electrical Stimulation Action IFC-57mnutes, 80-100 Hz   Electrical Stimulation Parameters supine   Electrical Stimulation Goals Pain     Iontophoresis   Type of Iontophoresis Dexamethasone   Location R knee   Dose 4106mmL    Time Stat Patch     Manual Therapy   Manual Therapy Joint mobilization;Soft tissue mobilization   Joint Mobilization patella mobilizations   Soft tissue mobilization IT band and piriformis, posterior distal hamstring soft tissue mobilization                PT Education - 08/29/16 1146    Education provided Yes   Education Details Reviewed HEP   Person(s) Educated Patient   Methods Explanation;Demonstration   Comprehension  Verbalized understanding;Returned demonstration          PT Short Term Goals - 08/29/16 1150      PT SHORT TERM GOAL #1   Title independent with initial HEP   Time 2   Period Weeks   Status Achieved           PT Long Term Goals - 08/29/16 1150      PT LONG TERM GOAL #1   Title independent with advanced HEP   Time 8   Period Weeks   Status New     PT LONG TERM GOAL #2   Title decrease pain 50% with activity   Period Weeks   Status On-going     PT LONG TERM GOAL #3   Title increase right knee AROM to 0-115 degrees flexion   Period Weeks   Status Partially Met     PT LONG TERM GOAL #4   Title get up and down without difficulty   Time 8   Period Weeks   Status New               Plan - 08/29/16 1147    Clinical Impression Statement Pt arriving to therapy complaining of 5/10 chronic right knee pain. Pt reporting constant pain. Pt with tenderness noted with medial knee and duirng patella mobilization. Pt with also increased pain during IT band rolling and hamstring soft tissue mobilizations. Pt discussing possible R knee replacement. Skilled Pt to progress pt's impairments with ROM, Strengthening and functional mobility.    Rehab Potential Good   PT Frequency 2x / week   PT Duration 8 weeks   PT Treatment/Interventions ADLs/Self Care Home Management;Cryotherapy;Electrical Stimulation;Iontophoresis 53m51ml Dexamethasone;Moist Heat;Ultrasound;Therapeutic activities;Therapeutic exercise;Patient/family education;Manual techniques   PT Next Visit Plan slowly add flexibility and exercises   Consulted and Agree with Plan of Care Patient      Patient will benefit from skilled therapeutic intervention in order to improve the following deficits and impairments:  Decreased mobility, Decreased strength, Decreased range of motion, Difficulty walking, Impaired flexibility, Pain, Increased muscle spasms  Visit Diagnosis: Chronic pain of right knee  Stiffness of right  knee, not elsewhere classified  Difficulty in walking, not elsewhere classified  Localized swelling, mass and lump, right lower limb     Problem List Patient Active Problem List   Diagnosis Date Noted  . GERD (gastroesophageal reflux disease) 07/03/2016  . Hemidiaphragm paralysis 07/03/2016  . Nodule of right lung 06/19/2016  . Essential hypertension 03/29/2016  . Glaucoma 03/29/2016  . Arthritis 03/29/2016  . Sinusitis, chronic 11/25/2012  . Cough variant asthma  11/25/2012  . Cough 11/24/2012    Oretha Caprice, MPT 08/29/2016, 12:12 PM  [place], [place], [address] Phone: [phone]   Fax:  [phone]  Name: Cassandra Sims MRN: [id number] Date of Birth: 1933/05/16

## 2016-08-30 ENCOUNTER — Encounter: Payer: Medicare Other | Admitting: Physical Therapy

## 2016-08-30 DIAGNOSIS — M810 Age-related osteoporosis without current pathological fracture: Secondary | ICD-10-CM | POA: Diagnosis not present

## 2016-09-04 ENCOUNTER — Ambulatory Visit: Payer: Medicare Other | Admitting: Physical Therapy

## 2016-09-04 DIAGNOSIS — M25511 Pain in right shoulder: Secondary | ICD-10-CM | POA: Diagnosis not present

## 2016-09-04 DIAGNOSIS — R262 Difficulty in walking, not elsewhere classified: Secondary | ICD-10-CM | POA: Diagnosis not present

## 2016-09-04 DIAGNOSIS — M25561 Pain in right knee: Principal | ICD-10-CM

## 2016-09-04 DIAGNOSIS — M25661 Stiffness of right knee, not elsewhere classified: Secondary | ICD-10-CM

## 2016-09-04 DIAGNOSIS — R2241 Localized swelling, mass and lump, right lower limb: Secondary | ICD-10-CM | POA: Diagnosis not present

## 2016-09-04 DIAGNOSIS — G8929 Other chronic pain: Secondary | ICD-10-CM

## 2016-09-04 NOTE — Therapy (Signed)
[place], [place], [address] Phone: [phone]   Fax:  [phone]  Physical Therapy Treatment  Patient Details  Name: Cassandra Sims MRN: [id number] Date of Birth: [birth year] Referring Provider: Rhona Raider  Encounter Date: 09/04/2016      PT End of Session - 09/04/16 1147    Visit Number 7   Date for PT Re-Evaluation 10/10/16   PT Start Time 1100   PT Stop Time 1155   PT Time Calculation (min) 55 min   Activity Tolerance Patient limited by pain      Past Medical History:  Diagnosis Date  . Arthritis   . Asthma   . Complication of anesthesia    hard to wake up  . GERD (gastroesophageal reflux disease)   . Glaucoma   . HOH (hard of hearing)   . Hypertension   . Multiple allergies   . Peripheral vascular disease (HCC)    varicose veins   . PONV (postoperative nausea and vomiting)   . Rhinitis   . Wears glasses     Past Surgical History:  Procedure Laterality Date  . ABDOMINAL HYSTERECTOMY    . COLECTOMY  2008   obstruction  . DIAGNOSTIC LAPAROSCOPY     exp  . DILATION AND CURETTAGE OF UTERUS    . FUNCTIONAL ENDOSCOPIC SINUS SURGERY  2009   bilat ethm,frontal,max  . NASAL SINUS SURGERY Bilateral 10/26/2012   Procedure: BILATERAL ENDOSCOPIC REVISION ETHMOID MAXILLARY AND FRONTAL SINUS SURGERY  ;  Surgeon: Izora Gala, MD;  Location: [facility];  Service: ENT;  Laterality: Bilateral;  . OVARIAN CYST SURGERY    . TUBAL LIGATION    . URETER SURGERY     cut accidentally during exp lap-ovarien cyst    There were no vitals filed for this visit.      Subjective Assessment - 09/04/16 1104    Subjective Reports the knee pain is just getting worse.  Called the MD last night due to up all day with pain in the knee cap.  Feeling like the therapy just makes the knee worse.  Even starting to hurt the hip.                           [facility] Adult PT  Treatment/Exercise - 09/04/16 0001      Ambulation/Gait   Gait Comments gait with SPC vcs and demonstration x 155f to decrease knee pain     Knee/Hip Exercises: Supine   Short Arc Quad Sets Both;2 sets;10 reps   Short Arc Quad Sets Limitations 2#   Bridges with [facility]10 reps   Straight Leg Raises 10 reps;2 sets   Straight Leg Raises Limitations 2#     Moist Heat Therapy   Number Minutes Moist Heat 15 Minutes   Moist Heat Location Shoulder     Cryotherapy   Number Minutes Cryotherapy 15 Minutes   Cryotherapy Location Knee   Type of Cryotherapy Ice pack     Electrical Stimulation   Electrical Stimulation Location right knee   Electrical Stimulation Action IFC   Electrical Stimulation Parameters supine   Electrical Stimulation Goals Pain     Iontophoresis   Type of Iontophoresis Dexamethasone   Location R knee   Dose 434mmL    Time Stat Patch                PT Education - 09/04/16 1146  Education provided Yes          PT Short Term Goals - 08/29/16 1150      PT SHORT TERM GOAL #1   Title independent with initial HEP   Time 2   Period Weeks   Status Achieved           PT Long Term Goals - 08/29/16 1150      PT LONG TERM GOAL #1   Title independent with advanced HEP   Time 8   Period Weeks   Status New     PT LONG TERM GOAL #2   Title decrease pain 50% with activity   Period Weeks   Status On-going     PT LONG TERM GOAL #3   Title increase right knee AROM to 0-115 degrees flexion   Period Weeks   Status Partially Met     PT LONG TERM GOAL #4   Title get up and down without difficulty   Time 8   Period Weeks   Status New               Plan - 09/04/16 1147    Clinical Impression Statement Pt reporting therapy is not helping the knee and even making it worse.  Pt to speak with MD today hopefully.  May be considering TKR   PT Treatment/Interventions ADLs/Self Care Home Management;Cryotherapy;Electrical  Stimulation;Iontophoresis 62m/ml Dexamethasone;Moist Heat;Ultrasound;Therapeutic activities;Therapeutic exercise;Patient/family education;Manual techniques   PT Next Visit Plan slowly add flexibility and exercises      Patient will benefit from skilled therapeutic intervention in order to improve the following deficits and impairments:     Visit Diagnosis: Chronic pain of right knee  Stiffness of right knee, not elsewhere classified  Difficulty in walking, not elsewhere classified  Localized swelling, mass and lump, right lower limb  Chronic right shoulder pain     Problem List Patient Active Problem List   Diagnosis Date Noted  . GERD (gastroesophageal reflux disease) 07/03/2016  . Hemidiaphragm paralysis 07/03/2016  . Nodule of right lung 06/19/2016  . Essential hypertension 03/29/2016  . Glaucoma 03/29/2016  . Arthritis 03/29/2016  . Sinusitis, chronic 11/25/2012  . Cough variant asthma 11/25/2012  . Cough 11/24/2012    TStark Bray2/14/2018, 11:50 AM  [place] [address]Phone: [phone]  Fax:  [phone] Name: Cassandra ZUBAMRN: [id number] of Birth: 804-14-1934

## 2016-09-06 DIAGNOSIS — M25561 Pain in right knee: Secondary | ICD-10-CM | POA: Diagnosis not present

## 2016-09-09 ENCOUNTER — Telehealth: Payer: Self-pay | Admitting: Physical Therapy

## 2016-09-10 ENCOUNTER — Ambulatory Visit: Payer: Medicare Other | Admitting: Physical Therapy

## 2016-09-11 DIAGNOSIS — D2311 Other benign neoplasm of skin of right eyelid, including canthus: Secondary | ICD-10-CM | POA: Diagnosis not present

## 2016-09-11 DIAGNOSIS — J324 Chronic pansinusitis: Secondary | ICD-10-CM | POA: Diagnosis not present

## 2016-09-16 ENCOUNTER — Telehealth: Payer: Self-pay | Admitting: Pulmonary Disease

## 2016-09-16 MED ORDER — ALBUTEROL SULFATE 108 (90 BASE) MCG/ACT IN AEPB: 1.0000 | INHALATION_SPRAY | Freq: Four times a day (QID) | RESPIRATORY_TRACT | 0 refills | Status: DC | PRN

## 2016-09-16 NOTE — Telephone Encounter (Signed)
Pt requesting albuterol sample.  This has been left up front for pickup.  Nothing further needed.

## 2016-09-20 DIAGNOSIS — L600 Ingrowing nail: Secondary | ICD-10-CM | POA: Diagnosis not present

## 2016-10-03 DIAGNOSIS — M2241 Chondromalacia patellae, right knee: Secondary | ICD-10-CM | POA: Diagnosis not present

## 2016-10-03 DIAGNOSIS — S83271A Complex tear of lateral meniscus, current injury, right knee, initial encounter: Secondary | ICD-10-CM | POA: Diagnosis not present

## 2016-10-03 DIAGNOSIS — S83231A Complex tear of medial meniscus, current injury, right knee, initial encounter: Secondary | ICD-10-CM | POA: Diagnosis not present

## 2016-10-03 DIAGNOSIS — Y999 Unspecified external cause status: Secondary | ICD-10-CM | POA: Diagnosis not present

## 2016-10-08 ENCOUNTER — Telehealth: Payer: Self-pay | Admitting: Pulmonary Disease

## 2016-10-08 MED ORDER — BENZONATATE 100 MG PO CAPS: 100.0000 mg | ORAL_CAPSULE | Freq: Three times a day (TID) | ORAL | 0 refills | Status: DC | PRN

## 2016-10-08 MED ORDER — PREDNISONE 20 MG PO TABS: 40.0000 mg | ORAL_TABLET | Freq: Every day | ORAL | 0 refills | Status: DC

## 2016-10-08 NOTE — Telephone Encounter (Signed)
Spoke with pt, c/o worsening nonprod "hacky" cough X4 days. Denies mucus production, chest pain, fever, worsening sob beyond baseline.   Pt notes she had knee sx on Thursday.  Pt is taking singulair and claritin, as well as benadryl 1 time to help with cough. She does not like taking benadryl as she fears this is too much antihistamines.  Pt requesting further recs.    Pt uses [facility] in [place].    JN please advise on further recs.  Thanks.

## 2016-10-08 NOTE — Telephone Encounter (Signed)
Go ahead and send in a prescription for Prednisone 40mg  daily x4 days. Also send in a prescription for Tessalon Perles 100mg  - take 1 po tid prn cough - #30. Thanks.

## 2016-10-08 NOTE — Telephone Encounter (Signed)
Spoke with patient-she is aware of Rx's per JN-pt aware Rx's sent to [facility] on file(confirmed with patient). Nothing more needed at this time.

## 2016-10-11 ENCOUNTER — Telehealth: Payer: Self-pay | Admitting: Pulmonary Disease

## 2016-10-11 DIAGNOSIS — R262 Difficulty in walking, not elsewhere classified: Secondary | ICD-10-CM | POA: Diagnosis not present

## 2016-10-11 DIAGNOSIS — S83231D Complex tear of medial meniscus, current injury, right knee, subsequent encounter: Secondary | ICD-10-CM | POA: Diagnosis not present

## 2016-10-11 DIAGNOSIS — M25561 Pain in right knee: Secondary | ICD-10-CM | POA: Diagnosis not present

## 2016-10-11 DIAGNOSIS — M2241 Chondromalacia patellae, right knee: Secondary | ICD-10-CM | POA: Diagnosis not present

## 2016-10-11 DIAGNOSIS — M25661 Stiffness of right knee, not elsewhere classified: Secondary | ICD-10-CM | POA: Diagnosis not present

## 2016-10-11 NOTE — Telephone Encounter (Signed)
Spoke with pt, aware of recs.  Scheduled to see PM on Monday.  Nothing further needed.

## 2016-10-11 NOTE — Telephone Encounter (Signed)
Patient returning call, CB is [phone].

## 2016-10-11 NOTE — Telephone Encounter (Signed)
lmtcb X1 for pt  

## 2016-10-11 NOTE — Telephone Encounter (Signed)
Spoke with pt, states the burst of prednisone given on 3/20 is not helping with cough- cough is prod with white/clear mucus, and prednisone is keeping pt up at night.  Denies chest pain, fever, chills, sinus congestion.   Pt is requesting further recs.   Pt uses [facility] in [place].    Sending to dod as Durene Cal is unavailable today.  MW please advise.  Thanks!   07/03/16 AVS: Instructions     Return in about 6 months (around 01/01/2017).   Continue taking your Dulera 1 puff twice daily.  We are going to try you on Pepcid to help with the reflux we saw. If you have any itching stop it and let me know.  I will see you back in 6 months after your CT scan. Call me if you have any new breathing problems before then.   TESTS ORDERED: 1. CT CHEST W/O May 2018

## 2016-10-11 NOTE — Telephone Encounter (Signed)
Nothing else to offer over the phone will need to go to UC or ER over w/e if can't wait to be seen first of week or worked in here today

## 2016-10-14 ENCOUNTER — Ambulatory Visit (INDEPENDENT_AMBULATORY_CARE_PROVIDER_SITE_OTHER): Payer: Medicare Other | Admitting: Pulmonary Disease

## 2016-10-14 ENCOUNTER — Ambulatory Visit (INDEPENDENT_AMBULATORY_CARE_PROVIDER_SITE_OTHER)
Admission: RE | Admit: 2016-10-14 | Discharge: 2016-10-14 | Disposition: A | Discharge status: Home / Self Care | Payer: Medicare Other | Source: Ambulatory Visit | Attending: Pulmonary Disease | Admitting: Pulmonary Disease

## 2016-10-14 ENCOUNTER — Encounter: Payer: Self-pay | Admitting: Pulmonary Disease

## 2016-10-14 VITALS — BP 124/64 | HR 72 | Ht 62.8 in | Wt 120.8 lb

## 2016-10-14 DIAGNOSIS — R05 Cough: Secondary | ICD-10-CM | POA: Diagnosis not present

## 2016-10-14 DIAGNOSIS — J45991 Cough variant asthma: Secondary | ICD-10-CM

## 2016-10-14 NOTE — Progress Notes (Signed)
Cassandra Sims    [id number]    1932-08-12  Primary Care Physician:JOBE,DANIEL B., MD  Referring Physician: Shellia Carwin, PA-C [address] [place], [place] [address]  Chief complaint:  Cough, sinus congestion  HPI: [name] is an 83 year old with past medical history of cough variant asthma, GERD, chronic sinusitis. She had a knee replacement on 3/15. She has complains of cough with congestion, white mucus, sinus congestion, postnasal drip, irritation in the back of the throat. There is no dyspnea, wheezing, fevers, chills, chest pain, palpitations. She was given prednisone and Tessalon Perles last week but this did not help and prednisone kept her up at night.  Outpatient Encounter Prescriptions as of 10/14/2016  Medication Sig  . acetaminophen (TYLENOL) 325 MG tablet Per bottle as needed for pain  . albuterol (PROVENTIL HFA;VENTOLIN HFA) 108 (90 BASE) MCG/ACT inhaler Inhale 2 puffs into the lungs every 4 (four) hours as needed for wheezing. Reported on 10/10/2015  . amLODipine (NORVASC) 10 MG tablet Take 5 mg by mouth daily.   . benzonatate (TESSALON) 100 MG capsule Take 1 capsule (100 mg total) by mouth 3 (three) times daily as needed for cough.  . calcium-vitamin D (OSCAL WITH D) 500-200 MG-UNIT per tablet Take 1 tablet by mouth 2 (two) times daily.   . cholecalciferol (VITAMIN D) 1000 UNITS tablet Take 1,000 Units by mouth daily.  . famotidine (PEPCID) 20 MG tablet Take 1 tablet (20 mg total) by mouth 2 (two) times daily.  Marland Kitchen guaiFENesin (MUCINEX) 600 MG 12 hr tablet Reported on 10/10/2015  . lactobacillus acidophilus (BACID) TABS Take 1 tablet by mouth daily.   . Misc Natural Products (LUTEIN 20) CAPS Take 1 capsule by mouth every morning.  . mometasone-formoterol (DULERA) 100-5 MCG/ACT AERO Take 2 puffs first thing in am and then another 2 puffs about 12 hours later. (Patient taking differently: Take 1 puff first thing in am and then another 1 puff about 12 hours  later.)  . montelukast (SINGULAIR) 10 MG tablet Take 10 mg by mouth at bedtime.  . Multiple Vitamins-Minerals (CENTRUM SILVER PO) Take 1 tablet by mouth daily.   . psyllium (METAMUCIL) 58.6 % powder 1 tsp every morning  . telmisartan (MICARDIS) 40 MG tablet Take 40 mg by mouth daily.  . [DISCONTINUED] albuterol (PROAIR HFA) 108 (90 BASE) MCG/ACT inhaler Inhale 2 puffs into the lungs every 4 (four) hours as needed for wheezing or shortness of breath.  . [DISCONTINUED] Albuterol Sulfate (PROAIR RESPICLICK) 665 (90 Base) MCG/ACT AEPB Inhale 1-2 puffs into the lungs every 6 (six) hours as needed.  . [DISCONTINUED] loratadine (CLARITIN) 10 MG tablet Take 10 mg by mouth daily.  . [DISCONTINUED] predniSONE (DELTASONE) 20 MG tablet Take 2 tablets (40 mg total) by mouth daily with breakfast.   No facility-administered encounter medications on file as of 10/14/2016.     Allergies as of 10/14/2016 - Review Complete 10/14/2016  Allergen Reaction Noted  . Penicillins Shortness Of Breath 10/20/2012  . Adhesive [tape] Itching 10/20/2012  . Clindamycin/lincomycin Itching 10/20/2012  . Codeine Nausea And Vomiting 10/20/2012  . Darvocet [propoxyphene n-acetaminophen]  10/20/2012  . Dilaudid [hydromorphone hcl] Nausea And Vomiting 10/20/2012  . Flagyl [metronidazole]  10/20/2012  . Lactose intolerance (gi)  10/20/2012  . Levofloxacin  09/19/2015  . Macrodantin [nitrofurantoin macrocrystal]  10/20/2012  . Morphine and related  10/20/2012  . Premarin [conjugated estrogens] Itching 10/20/2012  . Zantac [ranitidine hcl]    .  Zofran [ondansetron hcl]  10/20/2012  . Nitrofuran derivatives Rash 10/20/2012  . Sulfa antibiotics Rash 10/20/2012    Past Medical History:  Diagnosis Date  . Arthritis   . Asthma   . Complication of anesthesia    hard to wake up  . GERD (gastroesophageal reflux disease)   . Glaucoma   . HOH (hard of hearing)   . Hypertension   . Multiple allergies   . Peripheral vascular  disease (HCC)    varicose veins   . PONV (postoperative nausea and vomiting)   . Rhinitis   . Wears glasses     Past Surgical History:  Procedure Laterality Date  . ABDOMINAL HYSTERECTOMY    . COLECTOMY  2008   obstruction  . DIAGNOSTIC LAPAROSCOPY     exp  . DILATION AND CURETTAGE OF UTERUS    . FUNCTIONAL ENDOSCOPIC SINUS SURGERY  2009   bilat ethm,frontal,max  . NASAL SINUS SURGERY Bilateral 10/26/2012   Procedure: BILATERAL ENDOSCOPIC REVISION ETHMOID MAXILLARY AND FRONTAL SINUS SURGERY  ;  Surgeon: Izora Gala, MD;  Location: [facility];  Service: ENT;  Laterality: Bilateral;  . OVARIAN CYST SURGERY    . TUBAL LIGATION    . URETER SURGERY     cut accidentally during exp lap-ovarien cyst    Family History  Problem Relation Age of Onset  . Glaucoma Mother   . Arthritis Mother   . Lung disease Sister   . Breast cancer Daughter   . Diabetes Mellitus I Other     Social History   Social History  . Marital status: Divorced    Spouse name: N/A  . Number of children: N/A  . Years of education: N/A   Occupational History  . Not on file.   Social History Main Topics  . Smoking status: Never Smoker  . Smokeless tobacco: Never Used  . Alcohol use No  . Drug use: No  . Sexual activity: Not on file   Other Topics Concern  . Not on file   Social History Narrative   Originally from [place]. Previously lived in [place] for 14 months. 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 No bird, mold, or hot tub exposure.    Review of systems: Review of Systems  Constitutional: Negative for fever and chills.  HENT: Negative.   Eyes: Negative for blurred vision.  Respiratory: as per HPI  Cardiovascular: Negative for chest pain and palpitations.  Gastrointestinal: Negative for vomiting, diarrhea, blood per rectum. Genitourinary: Negative for dysuria, urgency, frequency and hematuria.  Musculoskeletal: Negative for myalgias, back pain and joint pain.  Skin:  Negative for itching and rash.  Neurological: Negative for dizziness, tremors, focal weakness, seizures and loss of consciousness.  Endo/Heme/Allergies: Negative for environmental allergies.  Psychiatric/Behavioral: Negative for depression, suicidal ideas and hallucinations.  All other systems reviewed and are negative.  Physical Exam: Blood pressure 124/64, pulse 72, height 5' 2.8" (1.595 m), weight 120 lb 12.8 oz (54.8 kg), SpO2 94 %. Gen:      No acute distress HEENT:  EOMI, sclera anicteric Neck:     No masses; no thyromegaly Lungs:    Clear to auscultation bilaterally; normal respiratory effort CV:         Regular rate and rhythm; no murmurs Abd:      + bowel sounds; soft, non-tender; no palpable masses, no distension Ext:    No edema; adequate peripheral perfusion Skin:      Warm and dry; no rash Neuro:  alert and oriented x 3 Psych: normal mood and affect  Data Reviewed: CT chest 06/19/16- scattered granulomas, emphysema, subcentimeter pulmonary nodules I have reviewed the images personally  PFTs (06/06/16) FVC 2.11 (86%) FEV1 1.62 [89%) F/F 77 TLC 90% DLCO 79%  Assessment:  Assessment for cough and congestion Symptoms are suggestive of upper airway cough from sinusitis, postnasal drip, GERD. There is no wheeze on examination. She will get chest x-ray for further evaluation.There is no evidence of infection and we will hold off on antibiotics unless the x ray is abnormal. She had a knee replacement recently but presentation is not suggestive of pulmonary embolism.  She will start benadryl 25 mg bid, nasacort nasal spray and OTC mucinex, continue on pepcid for GERD. If symptoms worsen then I asked her to call us back or go to emergency room for further eval.   Plan/Recommendations: - Benadryl, nasocort, mucinex - Continue pepcid - Chest x ray  Marshell Garfinkel MD [facility] Pager [phone] 10/14/2016, 12:22 PM  CC: Shellia Carwin, PA-C

## 2016-10-14 NOTE — Patient Instructions (Signed)
We will schedule you for a chest x-ray to evaluate for pneumonia if it's abnormal then will call and antibiotics.   Start taking Benadryl 25 mg 2 times daily for nasal congestion, postnasal drip Continue using the Nasacort twice daily Take Mucinex for congestion  Return to clinic in 3 months.

## 2016-10-15 ENCOUNTER — Telehealth: Payer: Self-pay | Admitting: Pulmonary Disease

## 2016-10-15 DIAGNOSIS — R262 Difficulty in walking, not elsewhere classified: Secondary | ICD-10-CM | POA: Diagnosis not present

## 2016-10-15 DIAGNOSIS — M25661 Stiffness of right knee, not elsewhere classified: Secondary | ICD-10-CM | POA: Diagnosis not present

## 2016-10-15 DIAGNOSIS — M25561 Pain in right knee: Secondary | ICD-10-CM | POA: Diagnosis not present

## 2016-10-15 DIAGNOSIS — S83231D Complex tear of medial meniscus, current injury, right knee, subsequent encounter: Secondary | ICD-10-CM | POA: Diagnosis not present

## 2016-10-15 NOTE — Telephone Encounter (Signed)
Please let the patient know that the CXR does not show any acute findings. There is no evidence of pneumonia.

## 2016-10-15 NOTE — Telephone Encounter (Signed)
Pt would like CXR results from 10/15/16. PM please advise and pt would like a call back before 10:00 am as she will be going to physical therapy.

## 2016-10-16 NOTE — Telephone Encounter (Signed)
Patient called for cxr results - she can be reached at [phone] -pr

## 2016-10-16 NOTE — Telephone Encounter (Signed)
Called and spoke to pt. Informed her of the results and recs per PM. Pt verbalized understanding and denied any further questions or concerns at this time.  

## 2016-10-17 DIAGNOSIS — R262 Difficulty in walking, not elsewhere classified: Secondary | ICD-10-CM | POA: Diagnosis not present

## 2016-10-17 DIAGNOSIS — M25561 Pain in right knee: Secondary | ICD-10-CM | POA: Diagnosis not present

## 2016-10-17 DIAGNOSIS — M25661 Stiffness of right knee, not elsewhere classified: Secondary | ICD-10-CM | POA: Diagnosis not present

## 2016-10-17 DIAGNOSIS — S83231D Complex tear of medial meniscus, current injury, right knee, subsequent encounter: Secondary | ICD-10-CM | POA: Diagnosis not present

## 2016-10-21 ENCOUNTER — Telehealth: Payer: Self-pay | Admitting: Pulmonary Disease

## 2016-10-21 ENCOUNTER — Ambulatory Visit: Payer: Medicare Other | Attending: Orthopaedic Surgery | Admitting: Physical Therapy

## 2016-10-21 ENCOUNTER — Encounter: Payer: Self-pay | Admitting: Physical Therapy

## 2016-10-21 DIAGNOSIS — M25661 Stiffness of right knee, not elsewhere classified: Secondary | ICD-10-CM | POA: Diagnosis not present

## 2016-10-21 DIAGNOSIS — M25561 Pain in right knee: Secondary | ICD-10-CM | POA: Diagnosis not present

## 2016-10-21 DIAGNOSIS — H401113 Primary open-angle glaucoma, right eye, severe stage: Secondary | ICD-10-CM | POA: Diagnosis not present

## 2016-10-21 DIAGNOSIS — R2241 Localized swelling, mass and lump, right lower limb: Secondary | ICD-10-CM | POA: Insufficient documentation

## 2016-10-21 DIAGNOSIS — H401122 Primary open-angle glaucoma, left eye, moderate stage: Secondary | ICD-10-CM | POA: Diagnosis not present

## 2016-10-21 DIAGNOSIS — R262 Difficulty in walking, not elsewhere classified: Secondary | ICD-10-CM | POA: Diagnosis not present

## 2016-10-21 MED ORDER — AZITHROMYCIN 250 MG PO TABS: ORAL_TABLET | ORAL | 0 refills | Status: DC

## 2016-10-21 MED ORDER — DOXYCYCLINE MONOHYDRATE 100 MG PO CAPS: 100.0000 mg | ORAL_CAPSULE | Freq: Two times a day (BID) | ORAL | 0 refills | Status: DC

## 2016-10-21 NOTE — Telephone Encounter (Signed)
Spoke with the pt  She was seen by PM for acute ov on 10/14/16 and given following instructions:  We will schedule you for a chest x-ray to evaluate for pneumonia if it's abnormal then will call and antibiotics.   Start taking Benadryl 25 mg 2 times daily for nasal congestion, postnasal drip Continue using the Nasacort twice daily Take Mucinex for congestion  Return to clinic in 3 months.   She states she is following all recs and her cough is not improving. She is coughing with white to light yellow sputum. CXR was neg for PNA. She is requesting something to help calm the cough. Please advise thanks!  Allergies  Allergen Reactions  . Penicillins Shortness Of Breath  . Adhesive [Tape] Itching  . Clindamycin/Lincomycin Itching  . Codeine Nausea And Vomiting  . Darvocet [Propoxyphene N-Acetaminophen]     HTN  . Dilaudid [Hydromorphone Hcl] Nausea And Vomiting  . Flagyl [Metronidazole]   . Lactose Intolerance (Gi)   . Levofloxacin     Joint pain  . Macrodantin [Nitrofurantoin Macrocrystal]   . Morphine And Related     hallucinate  . Premarin [Conjugated Estrogens] Itching  . Zantac [Ranitidine Hcl]     Itching   . Zofran [Ondansetron Hcl]     HTN  . Nitrofuran Derivatives Rash  . Sulfa Antibiotics Rash

## 2016-10-21 NOTE — Telephone Encounter (Signed)
Call in tessalon perles and doxy 100 mg bid for 7 days. Use mucinex DM OTC for chest congestion.

## 2016-10-21 NOTE — Telephone Encounter (Signed)
Spoke with pt and notified of recs per PM  She verbalized understanding  Rx was sent to pharm

## 2016-10-21 NOTE — Therapy (Signed)
[facility] [place], [place], [address] Phone: [phone]   Fax:  [phone]  Physical Therapy Evaluation  Patient Details  Name: Cassandra Sims MRN: [id number] Date of Birth: 04/07/1933 Referring Provider: Rhona Raider  Encounter Date: 10/21/2016      PT End of Session - 10/21/16 1548    Visit Number 1   Date for PT Re-Evaluation 12/21/16   PT Start Time 1524   PT Stop Time 1623   PT Time Calculation (min) 59 min   Activity Tolerance Patient tolerated treatment well   Behavior During Therapy [facility] for tasks assessed/performed      Past Medical History:  Diagnosis Date  . Arthritis   . Asthma   . Complication of anesthesia    hard to wake up  . GERD (gastroesophageal reflux disease)   . Glaucoma   . HOH (hard of hearing)   . Hypertension   . Multiple allergies   . Peripheral vascular disease (HCC)    varicose veins   . PONV (postoperative nausea and vomiting)   . Rhinitis   . Wears glasses     Past Surgical History:  Procedure Laterality Date  . ABDOMINAL HYSTERECTOMY    . COLECTOMY  2008   obstruction  . DIAGNOSTIC LAPAROSCOPY     exp  . DILATION AND CURETTAGE OF UTERUS    . FUNCTIONAL ENDOSCOPIC SINUS SURGERY  2009   bilat ethm,frontal,max  . NASAL SINUS SURGERY Bilateral 10/26/2012   Procedure: BILATERAL ENDOSCOPIC REVISION ETHMOID MAXILLARY AND FRONTAL SINUS SURGERY  ;  Surgeon: Izora Gala, MD;  Location: [facility];  Service: ENT;  Laterality: Bilateral;  . OVARIAN CYST SURGERY    . TUBAL LIGATION    . URETER SURGERY     cut accidentally during exp lap-ovarien cyst    There were no vitals filed for this visit.       Subjective Assessment - 10/21/16 1528    Subjective Pateint underwent a right knee arthroscopy on 10/03/16, underwent debridement.  She reports that they started her PT at the [place] office, and reports that she know me and the distance was too far and  stopped and decided to come to [place].     Limitations Lifting;Standing;Walking;House hold activities   Patient Stated Goals walk, have no pain   Currently in Pain? Yes   Pain Score 3    Pain Location Knee   Pain Orientation Right;Anterior   Pain Descriptors / Indicators Sore;Tightness   Pain Type Surgical pain   Pain Onset 1 to 4 weeks ago   Pain Frequency Constant   Aggravating Factors  being up on the leg, pain has been up to 9/10   Pain Relieving Factors ice, rest and pain meds   Effect of Pain on Daily Activities just limits everything            [facility] PT Assessment - 10/21/16 0001      Assessment   Medical Diagnosis s/p right knee scope   Referring Provider [place]   Onset Date/Surgical Date 10/03/16   Prior Therapy yes a few visits after surgery     Precautions   Precautions None     Balance Screen   Has the patient fallen in the past 6 months No   Has the patient had a decrease in activity level because of a fear of falling?  No   Is the patient reluctant to leave their home because of a  fear of falling?  No     Home Environment   Additional Comments does her own housework, light yardwork     Prior Function   Level of Independence Independent   Vocation Retired   Leisure some walking     AROM   Overall AROM Comments AROM of the right knee 12 -118 degrees flexion      Strength   Overall Strength Comments 4-/5 wiht some pain in the posterior knee     Flexibility   Soft Tissue Assessment /Muscle Length --  HS and calf tightness     Palpation   Palpation comment mild warmth anterior patellar area, mild warmth     Ambulation/Gait   Gait Comments used a walker for a few days, now without device, mild antalgic  on the right, two time in 25 feet she kind of bobbled, slow gait, some valgus at the knees     Standardized Balance Assessment   Standardized Balance Assessment Timed Up and Go Test     Timed Up and Go Test   Normal TUG (seconds) 18                    [facility] Adult PT Treatment/Exercise - 10/21/16 0001      Knee/Hip Exercises: Aerobic   Recumbent Bike 5 minutes   Nustep Level 4 x 58minutes     Vasopneumatic   Number Minutes Vasopneumatic  15 minutes   Vasopnuematic Location  Knee   Vasopneumatic Pressure Medium   Vasopneumatic Temperature  36                  PT Short Term Goals - 10/21/16 1552      PT SHORT TERM GOAL #1   Title independent with initial HEP   Time 2   Period Weeks   Status New           PT Long Term Goals - 10/21/16 1552      PT LONG TERM GOAL #1   Title independent with advanced HEP   Time 8   Period Weeks   Status New     PT LONG TERM GOAL #2   Title decrease pain 50% with activity   Time 8   Period Weeks   Status New     PT LONG TERM GOAL #3   Title increase right knee AROM to 0-120 degrees flexion   Time 8   Period Weeks   Status New     PT LONG TERM GOAL #4   Title get up and down without difficulty   Time 8   Period Weeks   Status New               Plan - 10/21/16 1549    Clinical Impression Statement Pateint had some ;long standing right knee pain, she had a right knee scope with meniscal debridement on 10/03/16.  She had 3 visits with PT at the [place] office but she reports that she knows [place] and lives close by and would rather come here, MD agreed.  She has limitation in extension, she has tightness in the right calf and HS, no device to walk but was slow (TUG 18 seconds) slight antalgic gait   Rehab Potential Good   PT Frequency 2x / week   PT Duration 8 weeks   PT Treatment/Interventions ADLs/Self Care Home Management;Cryotherapy;Electrical Stimulation;Iontophoresis 4mg /ml Dexamethasone;Moist Heat;Ultrasound;Therapeutic activities;Therapeutic exercise;Patient/family education;Manual techniques   PT Next Visit Plan slowly add flexibility and exercises  Consulted and Agree with Plan of Care Patient      Patient will benefit from  skilled therapeutic intervention in order to improve the following deficits and impairments:  Decreased mobility, Decreased strength, Decreased range of motion, Difficulty walking, Impaired flexibility, Pain, Increased muscle spasms  Visit Diagnosis: Acute pain of right knee - Plan: PT plan of care cert/re-cert  Stiffness of right knee, not elsewhere classified - Plan: PT plan of care cert/re-cert  Difficulty in walking, not elsewhere classified - Plan: PT plan of care cert/re-cert  Localized swelling, mass and lump, right lower limb - Plan: PT plan of care cert/re-cert      G-Codes - 09/62/83 1554    Functional Assessment Tool Used (Outpatient Only) foto 56% limitation   Functional Limitation Mobility: Walking and moving around   Mobility: Walking and Moving Around Current Status ([id number]) At least 40 percent but less than 60 percent impaired, limited or restricted   Mobility: Walking and Moving Around Goal Status [phone]) At least 20 percent but less than 40 percent impaired, limited or restricted       Problem List Patient Active Problem List   Diagnosis Date Noted  . GERD (gastroesophageal reflux disease) 07/03/2016  . Hemidiaphragm paralysis 07/03/2016  . Nodule of right lung 06/19/2016  . Essential hypertension 03/29/2016  . Glaucoma 03/29/2016  . Arthritis 03/29/2016  . Sinusitis, chronic 11/25/2012  . Cough variant asthma 11/25/2012  . Cough 11/24/2012    Sumner Boast., PT 10/21/2016, 3:55 PM  [facility] [place], [place], [address] Phone: [phone]   Fax:  [phone]  Name: Cassandra Sims MRN: [id number] Date of Birth: 05/31/1933

## 2016-10-21 NOTE — Telephone Encounter (Signed)
Called and spoke to pt, pt called in earlier todayt. Pt states she has an allergy to Doxycycline but is unsure of the reaction. Pt states she has taken a zpak in the past and would like to have this today. I personally spoke with Dr. Vaughan Browner and he states it is ok to send in the zpak. Rx sent to preferred pharmacy. Pt verbalized understanding and denied any further questions or concerns at this time.

## 2016-10-21 NOTE — Telephone Encounter (Signed)
LMTCB

## 2016-10-21 NOTE — Telephone Encounter (Signed)
Will forward to DOD since Jellico never responded to msg and is now off this pm

## 2016-10-21 NOTE — Telephone Encounter (Signed)
[phone] pt calling back

## 2016-10-24 ENCOUNTER — Encounter: Payer: Self-pay | Admitting: Physical Therapy

## 2016-10-24 ENCOUNTER — Ambulatory Visit: Payer: Medicare Other | Admitting: Physical Therapy

## 2016-10-24 DIAGNOSIS — M25661 Stiffness of right knee, not elsewhere classified: Secondary | ICD-10-CM | POA: Diagnosis not present

## 2016-10-24 DIAGNOSIS — R262 Difficulty in walking, not elsewhere classified: Secondary | ICD-10-CM | POA: Diagnosis not present

## 2016-10-24 DIAGNOSIS — R2241 Localized swelling, mass and lump, right lower limb: Secondary | ICD-10-CM | POA: Diagnosis not present

## 2016-10-24 DIAGNOSIS — M25561 Pain in right knee: Secondary | ICD-10-CM | POA: Diagnosis not present

## 2016-10-24 NOTE — Therapy (Signed)
[facility] [place], [place], [address] Phone: [phone]   Fax:  [phone]  Physical Therapy Treatment  Patient Details  Name: Cassandra Sims MRN: [id number] Date of Birth: 06-Aug-1932 Referring Provider: Rhona Raider  Encounter Date: 10/24/2016      PT End of Session - 10/24/16 1131    Visit Number 2   Date for PT Re-Evaluation 12/21/16   PT Start Time 1100   PT Stop Time 1140   PT Time Calculation (min) 40 min      Past Medical History:  Diagnosis Date  . Arthritis   . Asthma   . Complication of anesthesia    hard to wake up  . GERD (gastroesophageal reflux disease)   . Glaucoma   . HOH (hard of hearing)   . Hypertension   . Multiple allergies   . Peripheral vascular disease (HCC)    varicose veins   . PONV (postoperative nausea and vomiting)   . Rhinitis   . Wears glasses     Past Surgical History:  Procedure Laterality Date  . ABDOMINAL HYSTERECTOMY    . COLECTOMY  2008   obstruction  . DIAGNOSTIC LAPAROSCOPY     exp  . DILATION AND CURETTAGE OF UTERUS    . FUNCTIONAL ENDOSCOPIC SINUS SURGERY  2009   bilat ethm,frontal,max  . NASAL SINUS SURGERY Bilateral 10/26/2012   Procedure: BILATERAL ENDOSCOPIC REVISION ETHMOID MAXILLARY AND FRONTAL SINUS SURGERY  ;  Surgeon: Izora Gala, MD;  Location: [facility];  Service: ENT;  Laterality: Bilateral;  . OVARIAN CYST SURGERY    . TUBAL LIGATION    . URETER SURGERY     cut accidentally during exp lap-ovarien cyst    There were no vitals filed for this visit.      Subjective Assessment - 10/24/16 1101    Subjective nustep hurt me last time. stiff,kneecap pain and occassional lateral "catch" after sitting   Currently in Pain? Yes   Pain Score 4    Pain Location Knee   Pain Orientation Right                         [facility] Adult PT Treatment/Exercise - 10/24/16 0001      Knee/Hip Exercises: Aerobic   Recumbent Bike 5 minutes     Knee/Hip Exercises: Machines for Strengthening   Cybex Leg Press 20# 2 sets 10  calf raises 20# 2 sets 10     Knee/Hip Exercises: Standing   Lateral Step Up Right;2 sets;10 reps;Hand Hold: 1;Step Height: 4"   Forward Step Up Right;2 sets;10 reps;Hand Hold: 1;Step Height: 4"     Knee/Hip Exercises: Seated   Long Arc Quad Strengthening;Right;2 sets;10 reps  red tband   Clamshell with TheraBand Red  ball squeeze 20   Knee/Hip Flexion TKE 2 sets 10 red tband   Hamstring Curl Strengthening;Right;2 sets;10 reps  red tband     Manual Therapy   Manual Therapy Joint mobilization;Passive ROM   Joint Mobilization pat mobs   Soft tissue mobilization increase ext   Passive ROM ext stretch                 PT Education - 10/24/16 1127    Education provided Yes   Education Details LAQ with red tband, QS,SAQ,VMO   Person(s) Educated Patient   Methods Explanation;Demonstration   Comprehension Verbalized understanding;Returned demonstration  PT Short Term Goals - 10/24/16 1131      PT SHORT TERM GOAL #1   Title independent with initial HEP   Status Achieved           PT Long Term Goals - 10/21/16 1552      PT LONG TERM GOAL #1   Title independent with advanced HEP   Time 8   Period Weeks   Status New     PT LONG TERM GOAL #2   Title decrease pain 50% with activity   Time 8   Period Weeks   Status New     PT LONG TERM GOAL #3   Title increase right knee AROM to 0-120 degrees flexion   Time 8   Period Weeks   Status New     PT LONG TERM GOAL #4   Title get up and down without difficulty   Time 8   Period Weeks   Status New               Plan - 10/24/16 1131    Clinical Impression Statement STG met with issued HEP today. Tolerated ther ex well and declined ice. Focus on quad strength and activation.   PT Next Visit Plan ext ROM and quad strength      Patient will benefit from skilled therapeutic  intervention in order to improve the following deficits and impairments:  Decreased mobility, Decreased strength, Decreased range of motion, Difficulty walking, Impaired flexibility, Pain, Increased muscle spasms  Visit Diagnosis: Acute pain of right knee  Stiffness of right knee, not elsewhere classified  Difficulty in walking, not elsewhere classified     Problem List Patient Active Problem List   Diagnosis Date Noted  . GERD (gastroesophageal reflux disease) 07/03/2016  . Hemidiaphragm paralysis 07/03/2016  . Nodule of right lung 06/19/2016  . Essential hypertension 03/29/2016  . Glaucoma 03/29/2016  . Arthritis 03/29/2016  . Sinusitis, chronic 11/25/2012  . Cough variant asthma 11/25/2012  . Cough 11/24/2012    PAYSEUR,ANGIE PTA 10/24/2016, 11:32 AM  [facility] [place], [place], [address] Phone: [phone]   Fax:  [phone]  Name: Cassandra Sims MRN: [id number] Date of Birth: 09/25/1932

## 2016-10-25 DIAGNOSIS — M25561 Pain in right knee: Secondary | ICD-10-CM | POA: Diagnosis not present

## 2016-10-29 ENCOUNTER — Encounter: Payer: Self-pay | Admitting: Physical Therapy

## 2016-10-29 ENCOUNTER — Ambulatory Visit: Payer: Medicare Other | Admitting: Physical Therapy

## 2016-10-29 DIAGNOSIS — R262 Difficulty in walking, not elsewhere classified: Secondary | ICD-10-CM | POA: Diagnosis not present

## 2016-10-29 DIAGNOSIS — M25661 Stiffness of right knee, not elsewhere classified: Secondary | ICD-10-CM | POA: Diagnosis not present

## 2016-10-29 DIAGNOSIS — M25561 Pain in right knee: Secondary | ICD-10-CM

## 2016-10-29 DIAGNOSIS — R2241 Localized swelling, mass and lump, right lower limb: Secondary | ICD-10-CM

## 2016-10-29 NOTE — Therapy (Signed)
[place], [place], [address] Phone: [phone]   Fax:  [phone]  Physical Therapy Treatment  Patient Details  Name: Cassandra Sims MRN: [id number] Date of Birth: 02-04-1933 Referring Provider: Rhona Raider  Encounter Date: 10/29/2016      PT End of Session - 10/29/16 0928    Visit Number 3   Date for PT Re-Evaluation 12/21/16   PT Start Time 0848   PT Stop Time 0929   PT Time Calculation (min) 41 min   Activity Tolerance Patient tolerated treatment well   Behavior During Therapy [facility] for tasks assessed/performed      Past Medical History:  Diagnosis Date  . Arthritis   . Asthma   . Complication of anesthesia    hard to wake up  . GERD (gastroesophageal reflux disease)   . Glaucoma   . HOH (hard of hearing)   . Hypertension   . Multiple allergies   . Peripheral vascular disease (HCC)    varicose veins   . PONV (postoperative nausea and vomiting)   . Rhinitis   . Wears glasses     Past Surgical History:  Procedure Laterality Date  . ABDOMINAL HYSTERECTOMY    . COLECTOMY  2008   obstruction  . DIAGNOSTIC LAPAROSCOPY     exp  . DILATION AND CURETTAGE OF UTERUS    . FUNCTIONAL ENDOSCOPIC SINUS SURGERY  2009   bilat ethm,frontal,max  . NASAL SINUS SURGERY Bilateral 10/26/2012   Procedure: BILATERAL ENDOSCOPIC REVISION ETHMOID MAXILLARY AND FRONTAL SINUS SURGERY  ;  Surgeon: Izora Gala, MD;  Location: [place];  Service: ENT;  Laterality: Bilateral;  . OVARIAN CYST SURGERY    . TUBAL LIGATION    . URETER SURGERY     cut accidentally during exp lap-ovarien cyst    There were no vitals filed for this visit.      Subjective Assessment - 10/29/16 0849    Subjective "feeling pretty good, Its just stiff and sore, yesterday it was swollen"   Currently in Pain? Yes   Pain Score 2    Pain Location Knee   Pain Orientation Right                          [facility] Adult PT Treatment/Exercise - 10/29/16 0001      Knee/Hip Exercises: Aerobic   Recumbent Bike 5 minutes     Knee/Hip Exercises: Machines for Strengthening   Cybex Leg Press 20# 2 sets 10; heel raises 20lb 2x15      Knee/Hip Exercises: Standing   Lateral Step Up Right;2 sets;10 reps;Hand Hold: 1;Step Height: 4";Hand Hold: 0   Forward Step Up Right;2 sets;10 reps;Hand Hold: 1;Step Height: 4";Hand Hold: 0     Knee/Hip Exercises: Seated   Long Arc Quad Strengthening;Right;2 sets;10 reps   Long Arc Quad Weight 3 lbs.   Knee/Hip Flexion TKE 2 sets 12 red tband   Marching Limitations RLE only 2x10   Marching Weights 2 lbs.   Hamstring Curl Strengthening;Right;2 sets;15 reps   Hamstring Limitations red tband     Manual Therapy   Manual Therapy Passive ROM   Manual therapy comments Some pain at end ranges    Passive ROM flex and ext                   PT Short Term Goals - 10/29/16 1601      PT  SHORT TERM GOAL #1   Title independent with initial HEP   Status Achieved           PT Long Term Goals - 10/29/16 0929      PT LONG TERM GOAL #1   Title independent with advanced HEP   Status On-going     PT LONG TERM GOAL #2   Title decrease pain 50% with activity   Status Partially Met     PT LONG TERM GOAL #3   Title increase right knee AROM to 0-120 degrees flexion   Status On-going     PT LONG TERM GOAL #4   Title get up and down without difficulty   Status On-going               Plan - 10/29/16 0929    Clinical Impression Statement No issues with today's treatment. focused on R quad strength with machine level interventions and functional training. Does reports little pain at the end ranges during R knee PROM. Cues given to push down through RLE with step ups.   Rehab Potential Good   PT Frequency 2x / week   PT Duration 8 weeks   PT Treatment/Interventions ADLs/Self Care Home  Management;Cryotherapy;Electrical Stimulation;Iontophoresis 72m/ml Dexamethasone;Moist Heat;Ultrasound;Therapeutic activities;Therapeutic exercise;Patient/family education;Manual techniques   PT Next Visit Plan ext ROM and quad strength      Patient will benefit from skilled therapeutic intervention in order to improve the following deficits and impairments:  Decreased mobility, Decreased strength, Decreased range of motion, Difficulty walking, Impaired flexibility, Pain, Increased muscle spasms  Visit Diagnosis: Acute pain of right knee  Stiffness of right knee, not elsewhere classified  Difficulty in walking, not elsewhere classified  Localized swelling, mass and lump, right lower limb     Problem List Patient Active Problem List   Diagnosis Date Noted  . GERD (gastroesophageal reflux disease) 07/03/2016  . Hemidiaphragm paralysis 07/03/2016  . Nodule of right lung 06/19/2016  . Essential hypertension 03/29/2016  . Glaucoma 03/29/2016  . Arthritis 03/29/2016  . Sinusitis, chronic 11/25/2012  . Cough variant asthma 11/25/2012  . Cough 11/24/2012    RScot Jun PTA 10/29/2016, 9:31 AM  [place] [address]Phone: [phone]  Fax:  [phone] Name: Cassandra FETTERLYMRN: [id number] of Birth: 8January 24, 1934

## 2016-10-31 ENCOUNTER — Encounter: Payer: Self-pay | Admitting: Physical Therapy

## 2016-10-31 ENCOUNTER — Ambulatory Visit: Payer: Medicare Other | Admitting: Physical Therapy

## 2016-10-31 DIAGNOSIS — M25661 Stiffness of right knee, not elsewhere classified: Secondary | ICD-10-CM | POA: Diagnosis not present

## 2016-10-31 DIAGNOSIS — M25561 Pain in right knee: Secondary | ICD-10-CM | POA: Diagnosis not present

## 2016-10-31 DIAGNOSIS — R262 Difficulty in walking, not elsewhere classified: Secondary | ICD-10-CM

## 2016-10-31 DIAGNOSIS — R2241 Localized swelling, mass and lump, right lower limb: Secondary | ICD-10-CM

## 2016-10-31 NOTE — Therapy (Signed)
[place], [place], [address] Phone: [phone]   Fax:  [phone]  Physical Therapy Treatment  Patient Details  Name: Cassandra Sims MRN: [id number] Date of Birth: 1933/01/27 Referring Provider: Rhona Raider  Encounter Date: 10/31/2016      PT End of Session - 10/31/16 1101    Visit Number 4   Date for PT Re-Evaluation 12/21/16   PT Start Time 1011   PT Stop Time 1100   PT Time Calculation (min) 49 min   Activity Tolerance Patient tolerated treatment well   Behavior During Therapy [facility] for tasks assessed/performed      Past Medical History:  Diagnosis Date  . Arthritis   . Asthma   . Complication of anesthesia    hard to wake up  . GERD (gastroesophageal reflux disease)   . Glaucoma   . HOH (hard of hearing)   . Hypertension   . Multiple allergies   . Peripheral vascular disease (HCC)    varicose veins   . PONV (postoperative nausea and vomiting)   . Rhinitis   . Wears glasses     Past Surgical History:  Procedure Laterality Date  . ABDOMINAL HYSTERECTOMY    . COLECTOMY  2008   obstruction  . DIAGNOSTIC LAPAROSCOPY     exp  . DILATION AND CURETTAGE OF UTERUS    . FUNCTIONAL ENDOSCOPIC SINUS SURGERY  2009   bilat ethm,frontal,max  . NASAL SINUS SURGERY Bilateral 10/26/2012   Procedure: BILATERAL ENDOSCOPIC REVISION ETHMOID MAXILLARY AND FRONTAL SINUS SURGERY  ;  Surgeon: Izora Gala, MD;  Location: [place];  Service: ENT;  Laterality: Bilateral;  . OVARIAN CYST SURGERY    . TUBAL LIGATION    . URETER SURGERY     cut accidentally during exp lap-ovarien cyst    There were no vitals filed for this visit.      Subjective Assessment - 10/31/16 1015    Subjective I think we over did it the last time, I have been hurting more, c/o right lateral knee pain and swelling, mentions that maybe the leg press caused the pain   Currently in Pain? Yes   Pain Score 4    Pain Location Knee   Pain Orientation Right;Lateral   Pain Descriptors / Indicators Sore;Tightness                         [facility] Adult PT Treatment/Exercise - 10/31/16 0001      Ambulation/Gait   Gait Comments around the building no device, no rest breaks     Knee/Hip Exercises: Stretches   Passive Hamstring Stretch 4 reps;20 seconds   Piriformis Stretch 4 reps;20 seconds   Gastroc Stretch 4 reps;30 seconds   Other Knee/Hip Stretches ITB stretch 3 x 20 reps     Knee/Hip Exercises: Aerobic   Nustep Level 4 x 70mnutes     Knee/Hip Exercises: Supine   Short Arc Quad Sets Both;2 sets;10 reps   Short Arc Quad Sets Limitations 3#   Heel Slides 2 sets;10 reps   Heel Slides Limitations red tband   Other Supine Knee/Hip Exercises red tband DF     Knee/Hip Exercises: Sidelying   Clams 10 reps 2 sets     Ultrasound   Ultrasound Location right lateral knee   Ultrasound Parameters 100% 1.3w/cm2 1MHz   Ultrasound Goals Pain  PT Short Term Goals - 10/29/16 0684      PT SHORT TERM GOAL #1   Title independent with initial HEP   Status Achieved           PT Long Term Goals - 10/29/16 0929      PT LONG TERM GOAL #1   Title independent with advanced HEP   Status On-going     PT LONG TERM GOAL #2   Title decrease pain 50% with activity   Status Partially Met     PT LONG TERM GOAL #3   Title increase right knee AROM to 0-120 degrees flexion   Status On-going     PT LONG TERM GOAL #4   Title get up and down without difficulty   Status On-going               Plan - 10/31/16 1101    Clinical Impression Statement Patient reported that her last treatment caused some increased pain.  Today she is very tight again she is resistant to stretches, tightness is mostly in the calves and the HS.  We tried [place] and added a walk around the building since her goal is to get back to walking  did not report any added pain after the walk    PT Next Visit Plan may back off a little with the exercise and continue to work on flexibility and walking   Consulted and Agree with Plan of Care Patient      Patient will benefit from skilled therapeutic intervention in order to improve the following deficits and impairments:  Decreased mobility, Decreased strength, Decreased range of motion, Difficulty walking, Impaired flexibility, Pain, Increased muscle spasms  Visit Diagnosis: Acute pain of right knee  Stiffness of right knee, not elsewhere classified  Difficulty in walking, not elsewhere classified  Localized swelling, mass and lump, right lower limb     Problem List Patient Active Problem List   Diagnosis Date Noted  . GERD (gastroesophageal reflux disease) 07/03/2016  . Hemidiaphragm paralysis 07/03/2016  . Nodule of right lung 06/19/2016  . Essential hypertension 03/29/2016  . Glaucoma 03/29/2016  . Arthritis 03/29/2016  . Sinusitis, chronic 11/25/2012  . Cough variant asthma 11/25/2012  . Cough 11/24/2012    Sumner Boast., PT 10/31/2016, 11:11 AM  [place], [place], [address] Phone: [phone]   Fax:  [phone]  Name: Cassandra Sims MRN: [id number] Date of Birth: 1932-11-28

## 2016-11-04 ENCOUNTER — Encounter: Payer: Self-pay | Admitting: Physical Therapy

## 2016-11-04 ENCOUNTER — Ambulatory Visit: Payer: Medicare Other | Admitting: Physical Therapy

## 2016-11-04 DIAGNOSIS — R262 Difficulty in walking, not elsewhere classified: Secondary | ICD-10-CM

## 2016-11-04 DIAGNOSIS — R2241 Localized swelling, mass and lump, right lower limb: Secondary | ICD-10-CM | POA: Diagnosis not present

## 2016-11-04 DIAGNOSIS — M25661 Stiffness of right knee, not elsewhere classified: Secondary | ICD-10-CM | POA: Diagnosis not present

## 2016-11-04 DIAGNOSIS — M25561 Pain in right knee: Secondary | ICD-10-CM | POA: Diagnosis not present

## 2016-11-04 NOTE — Therapy (Signed)
[place], [place], [address] Phone: [phone]   Fax:  [phone]  Physical Therapy Treatment  Patient Details  Name: Cassandra Sims MRN: [id number] Date of Birth: 1933/02/21 Referring Provider: Rhona Raider  Encounter Date: 11/04/2016      PT End of Session - 11/04/16 1520    Visit Number 5   Date for PT Re-Evaluation 12/21/16   PT Start Time 1435   PT Stop Time 1520   PT Time Calculation (min) 45 min   Activity Tolerance Patient tolerated treatment well   Behavior During Therapy [facility] for tasks assessed/performed      Past Medical History:  Diagnosis Date  . Arthritis   . Asthma   . Complication of anesthesia    hard to wake up  . GERD (gastroesophageal reflux disease)   . Glaucoma   . HOH (hard of hearing)   . Hypertension   . Multiple allergies   . Peripheral vascular disease (HCC)    varicose veins   . PONV (postoperative nausea and vomiting)   . Rhinitis   . Wears glasses     Past Surgical History:  Procedure Laterality Date  . ABDOMINAL HYSTERECTOMY    . COLECTOMY  2008   obstruction  . DIAGNOSTIC LAPAROSCOPY     exp  . DILATION AND CURETTAGE OF UTERUS    . FUNCTIONAL ENDOSCOPIC SINUS SURGERY  2009   bilat ethm,frontal,max  . NASAL SINUS SURGERY Bilateral 10/26/2012   Procedure: BILATERAL ENDOSCOPIC REVISION ETHMOID MAXILLARY AND FRONTAL SINUS SURGERY  ;  Surgeon: Izora Gala, MD;  Location: [place];  Service: ENT;  Laterality: Bilateral;  . OVARIAN CYST SURGERY    . TUBAL LIGATION    . URETER SURGERY     cut accidentally during exp lap-ovarien cyst    There were no vitals filed for this visit.      Subjective Assessment - 11/04/16 1440    Subjective Patient states that she is just sore and stiff and she liked last treatment.    Currently in Pain? Yes   Pain Score 3    Pain Location Knee   Pain Orientation Right   Pain Descriptors / Indicators  Sore;Tightness                         [facility] Adult PT Treatment/Exercise - 11/04/16 0001      Knee/Hip Exercises: Stretches   Passive Hamstring Stretch 4 reps;20 seconds   Other Knee/Hip Stretches ITB stretch 3 x 20 reps     Knee/Hip Exercises: Aerobic   Stationary Bike 5 min   Nustep Level 4 x 16mnutes   Other Aerobic Walk around the building x 2      Knee/Hip Exercises: Supine   Short Arc Quad Sets Right;3 sets;10 reps   Short Arc Quad Sets Limitations 2#   Other Supine Knee/Hip Exercises 2x10 Ham curl red theraband     Modalities   Modalities Iontophoresis     Iontophoresis   Type of Iontophoresis Dexamethasone   Location R knee   Dose 872m  Time 4 hour patch 1                  PT Short Term Goals - 10/29/16 090998    PT SHORT TERM GOAL #1   Title independent with initial HEP   Status Achieved  PT Long Term Goals - 10/29/16 0929      PT LONG TERM GOAL #1   Title independent with advanced HEP   Status On-going     PT LONG TERM GOAL #2   Title decrease pain 50% with activity   Status Partially Met     PT LONG TERM GOAL #3   Title increase right knee AROM to 0-120 degrees flexion   Status On-going     PT LONG TERM GOAL #4   Title get up and down without difficulty   Status On-going               Plan - 11/04/16 1521    Clinical Impression Statement Patient walked around the building twice without any breaks, she did not report any pain during or after walking. She is still very tight but has a better range than last session. We tried a iontophoresis patch how will check next session to see how she tolerated it.    PT Next Visit Plan Continue to work on flexibility and walking. Check how she tolerated iontophoresis.    Consulted and Agree with Plan of Care Patient      Patient will benefit from skilled therapeutic intervention in order to improve the following deficits and impairments:  Decreased mobility,  Decreased strength, Decreased range of motion, Difficulty walking, Impaired flexibility, Pain, Increased muscle spasms  Visit Diagnosis: Acute pain of right knee  Stiffness of right knee, not elsewhere classified  Localized swelling, mass and lump, right lower limb  Difficulty in walking, not elsewhere classified     Problem List Patient Active Problem List   Diagnosis Date Noted  . GERD (gastroesophageal reflux disease) 07/03/2016  . Hemidiaphragm paralysis 07/03/2016  . Nodule of right lung 06/19/2016  . Essential hypertension 03/29/2016  . Glaucoma 03/29/2016  . Arthritis 03/29/2016  . Sinusitis, chronic 11/25/2012  . Cough variant asthma 11/25/2012  . Cough 11/24/2012    Alan Mulder SPTA 11/04/2016, 3:29 PM  [place], [place], [address] Phone: [phone]   Fax:  [phone]  Name: MARCELENE WEIDEMANN MRN: [id number] Date of Birth: 1933/04/02

## 2016-11-07 ENCOUNTER — Ambulatory Visit: Payer: Medicare Other

## 2016-11-08 ENCOUNTER — Encounter: Payer: Self-pay | Admitting: Physical Therapy

## 2016-11-08 ENCOUNTER — Ambulatory Visit: Payer: Medicare Other | Admitting: Physical Therapy

## 2016-11-08 DIAGNOSIS — M25661 Stiffness of right knee, not elsewhere classified: Secondary | ICD-10-CM | POA: Diagnosis not present

## 2016-11-08 DIAGNOSIS — R262 Difficulty in walking, not elsewhere classified: Secondary | ICD-10-CM | POA: Diagnosis not present

## 2016-11-08 DIAGNOSIS — R2241 Localized swelling, mass and lump, right lower limb: Secondary | ICD-10-CM | POA: Diagnosis not present

## 2016-11-08 DIAGNOSIS — M25561 Pain in right knee: Secondary | ICD-10-CM | POA: Diagnosis not present

## 2016-11-08 NOTE — Therapy (Signed)
[place], [place], [address] Phone: [phone]   Fax:  [phone]  Physical Therapy Treatment  Patient Details  Name: Cassandra Sims MRN: [id number] Date of Birth: 25-Sep-1932 Referring Provider: Rhona Raider  Encounter Date: 11/08/2016      PT End of Session - 11/08/16 1008    Visit Number 6   Date for PT Re-Evaluation 12/21/16   PT Start Time 0925   PT Stop Time 1005   PT Time Calculation (min) 40 min   Activity Tolerance Patient tolerated treatment well   Behavior During Therapy [facility] for tasks assessed/performed      Past Medical History:  Diagnosis Date  . Arthritis   . Asthma   . Complication of anesthesia    hard to wake up  . GERD (gastroesophageal reflux disease)   . Glaucoma   . HOH (hard of hearing)   . Hypertension   . Multiple allergies   . Peripheral vascular disease (HCC)    varicose veins   . PONV (postoperative nausea and vomiting)   . Rhinitis   . Wears glasses     Past Surgical History:  Procedure Laterality Date  . ABDOMINAL HYSTERECTOMY    . COLECTOMY  2008   obstruction  . DIAGNOSTIC LAPAROSCOPY     exp  . DILATION AND CURETTAGE OF UTERUS    . FUNCTIONAL ENDOSCOPIC SINUS SURGERY  2009   bilat ethm,frontal,max  . NASAL SINUS SURGERY Bilateral 10/26/2012   Procedure: BILATERAL ENDOSCOPIC REVISION ETHMOID MAXILLARY AND FRONTAL SINUS SURGERY  ;  Surgeon: Izora Gala, MD;  Location: [place];  Service: ENT;  Laterality: Bilateral;  . OVARIAN CYST SURGERY    . TUBAL LIGATION    . URETER SURGERY     cut accidentally during exp lap-ovarien cyst    There were no vitals filed for this visit.      Subjective Assessment - 11/08/16 0944    Subjective Patient states that she is a little stiff this morning but liked last treatment and the ionto patch.                         [place] Adult PT Treatment/Exercise - 11/08/16 0001       Ambulation/Gait   Gait Comments around the building no device, no rest breaks     Knee/Hip Exercises: Stretches   Passive Hamstring Stretch 4 reps;20 seconds   Other Knee/Hip Stretches ITB stretch 3 x 20 reps     Knee/Hip Exercises: Aerobic   Nustep Level 4 x 73mnutes   Other Aerobic Walk around the building x 2      Knee/Hip Exercises: Seated   Long Arc Quad Strengthening;Right;2 sets;10 reps   Ball Squeeze green ball 3 x 10   Clamshell with TheraBand Red  3 x 10    Hamstring Curl Strengthening;Right;2 sets;15 reps   Hamstring Limitations red tband                  PT Short Term Goals - 10/29/16 0929      PT SHORT TERM GOAL #1   Title independent with initial HEP   Status Achieved           PT Long Term Goals - 11/08/16 0949      PT LONG TERM GOAL #1   Title independent with advanced HEP   Status Achieved     PT LONG TERM GOAL #  2   Title decrease pain 50% with activity   Status Achieved     PT LONG TERM GOAL #3   Title increase right knee AROM to 0-120 degrees flexion   Status Achieved     PT LONG TERM GOAL #4   Title get up and down without difficulty   Status Achieved               Plan - 11/08/16 1009    Clinical Impression Statement Patient was able to walk around the building twice again without any breaks with no increase in pain during or after walking. She stated that the ionto patch helped decrease the knot on her ITB. She has met all her goals with ROM 0-124. The patient is pleased with her improvement and her function, we are going to put her on hold till she see her MD at the beginning of May.    PT Next Visit Plan Hold for 2 week, D/C if no follow up      Patient will benefit from skilled therapeutic intervention in order to improve the following deficits and impairments:  Decreased mobility, Decreased strength, Decreased range of motion, Difficulty walking, Impaired flexibility, Pain, Increased muscle spasms  Visit  Diagnosis: Stiffness of right knee, not elsewhere classified  Difficulty in walking, not elsewhere classified     Problem List Patient Active Problem List   Diagnosis Date Noted  . GERD (gastroesophageal reflux disease) 07/03/2016  . Hemidiaphragm paralysis 07/03/2016  . Nodule of right lung 06/19/2016  . Essential hypertension 03/29/2016  . Glaucoma 03/29/2016  . Arthritis 03/29/2016  . Sinusitis, chronic 11/25/2012  . Cough variant asthma 11/25/2012  . Cough 11/24/2012    Alan Mulder 11/08/2016, 10:16 AM  [place], [place], [address] Phone: [phone]   Fax:  [phone]  Name: Cassandra Sims MRN: [id number] Date of Birth: 1932/09/03

## 2016-11-11 DIAGNOSIS — L299 Pruritus, unspecified: Secondary | ICD-10-CM | POA: Diagnosis not present

## 2016-11-11 DIAGNOSIS — J4520 Mild intermittent asthma, uncomplicated: Secondary | ICD-10-CM | POA: Insufficient documentation

## 2016-11-11 DIAGNOSIS — Z23 Encounter for immunization: Secondary | ICD-10-CM | POA: Diagnosis not present

## 2016-11-11 DIAGNOSIS — R7303 Prediabetes: Secondary | ICD-10-CM | POA: Diagnosis not present

## 2016-11-13 DIAGNOSIS — H401113 Primary open-angle glaucoma, right eye, severe stage: Secondary | ICD-10-CM | POA: Diagnosis not present

## 2016-11-13 DIAGNOSIS — H401122 Primary open-angle glaucoma, left eye, moderate stage: Secondary | ICD-10-CM | POA: Diagnosis not present

## 2016-11-18 ENCOUNTER — Telehealth: Payer: Self-pay | Admitting: Pulmonary Disease

## 2016-11-18 NOTE — Telephone Encounter (Signed)
JN  Please Advise-  Pt called wanting to know if you were willing to give the ok for a referral to be placed for the pt to see a allergist due to excessive itching. Pt states she has tried benadryl and she states her pcp gave her hydroxyzine but does not like the way it makes her feel. She states she is scared that she may be allergic to something from her recent knee surgery and she has an upcoming eye surgery on May 8. Offered to pt to contact her pcp but she wanted to check with you first. Pt states her breathing is doing ok currently.

## 2016-11-18 NOTE — Telephone Encounter (Signed)
Patient returned phone call.Cassandra Sims ° °

## 2016-11-18 NOTE — Telephone Encounter (Signed)
LM x 1 

## 2016-11-18 NOTE — Telephone Encounter (Signed)
That's fine by me. If she has a rash she should probably see a dermatologist.

## 2016-11-19 NOTE — Telephone Encounter (Signed)
Spoke with pt, aware of JN's recs. Pt also wants to know if she has "full lung capacity despite her diaphragm collapse"- pt states she was asked this at her knee sx and did not know how to answer this.  JN please advise.  Thanks.

## 2016-11-19 NOTE — Telephone Encounter (Signed)
She is compensating despite her diaphragm and as per her PFTs in November had appropriate lung volumes.

## 2016-11-20 NOTE — Telephone Encounter (Signed)
Called and spoke with pt and she is aware of results per [facility]

## 2016-11-22 DIAGNOSIS — M25561 Pain in right knee: Secondary | ICD-10-CM | POA: Diagnosis not present

## 2016-11-26 DIAGNOSIS — H401113 Primary open-angle glaucoma, right eye, severe stage: Secondary | ICD-10-CM | POA: Diagnosis not present

## 2016-11-27 DIAGNOSIS — Z9889 Other specified postprocedural states: Secondary | ICD-10-CM | POA: Diagnosis not present

## 2016-11-27 DIAGNOSIS — H401122 Primary open-angle glaucoma, left eye, moderate stage: Secondary | ICD-10-CM | POA: Diagnosis not present

## 2016-11-27 DIAGNOSIS — H401113 Primary open-angle glaucoma, right eye, severe stage: Secondary | ICD-10-CM | POA: Diagnosis not present

## 2016-12-05 ENCOUNTER — Ambulatory Visit: Payer: Self-pay | Admitting: Allergy and Immunology

## 2016-12-11 DIAGNOSIS — H6983 Other specified disorders of Eustachian tube, bilateral: Secondary | ICD-10-CM | POA: Diagnosis not present

## 2016-12-11 DIAGNOSIS — H6501 Acute serous otitis media, right ear: Secondary | ICD-10-CM | POA: Diagnosis not present

## 2016-12-11 DIAGNOSIS — J310 Chronic rhinitis: Secondary | ICD-10-CM | POA: Diagnosis not present

## 2016-12-17 ENCOUNTER — Ambulatory Visit (INDEPENDENT_AMBULATORY_CARE_PROVIDER_SITE_OTHER)
Admission: RE | Admit: 2016-12-17 | Discharge: 2016-12-17 | Disposition: A | Discharge status: Home / Self Care | Payer: Medicare Other | Source: Ambulatory Visit | Attending: Pulmonary Disease | Admitting: Pulmonary Disease

## 2016-12-17 DIAGNOSIS — R911 Solitary pulmonary nodule: Secondary | ICD-10-CM

## 2016-12-20 ENCOUNTER — Telehealth: Payer: Self-pay | Admitting: Pulmonary Disease

## 2016-12-20 DIAGNOSIS — M25561 Pain in right knee: Secondary | ICD-10-CM | POA: Diagnosis not present

## 2016-12-20 NOTE — Telephone Encounter (Signed)
ATC x3 and received busy signal. wcb 

## 2016-12-23 NOTE — Telephone Encounter (Signed)
Spoke with pt, who is requesting CT results from 12/17/16.  JN please advise. Thanks.

## 2016-12-23 NOTE — Telephone Encounter (Signed)
Please let the patient know that I reviewed her chest CT. There is no change in the spot was in her right lower lobe. There is no new development or abnormality compared with her previous imaging. We will review the imaging at her 6/20 appointment. Thank you.

## 2016-12-24 NOTE — Telephone Encounter (Signed)
Spoke with patient about her results. She verbalized understanding. Nothing further needed at time of call.

## 2016-12-26 DIAGNOSIS — M25561 Pain in right knee: Secondary | ICD-10-CM | POA: Diagnosis not present

## 2016-12-26 DIAGNOSIS — R2681 Unsteadiness on feet: Secondary | ICD-10-CM | POA: Diagnosis not present

## 2016-12-27 DIAGNOSIS — D1801 Hemangioma of skin and subcutaneous tissue: Secondary | ICD-10-CM | POA: Diagnosis not present

## 2016-12-27 DIAGNOSIS — D2372 Other benign neoplasm of skin of left lower limb, including hip: Secondary | ICD-10-CM | POA: Diagnosis not present

## 2016-12-27 DIAGNOSIS — L820 Inflamed seborrheic keratosis: Secondary | ICD-10-CM | POA: Diagnosis not present

## 2016-12-27 DIAGNOSIS — D485 Neoplasm of uncertain behavior of skin: Secondary | ICD-10-CM | POA: Diagnosis not present

## 2016-12-30 DIAGNOSIS — R2681 Unsteadiness on feet: Secondary | ICD-10-CM | POA: Diagnosis not present

## 2016-12-30 DIAGNOSIS — M25561 Pain in right knee: Secondary | ICD-10-CM | POA: Diagnosis not present

## 2017-01-02 DIAGNOSIS — R2681 Unsteadiness on feet: Secondary | ICD-10-CM | POA: Diagnosis not present

## 2017-01-02 DIAGNOSIS — M25561 Pain in right knee: Secondary | ICD-10-CM | POA: Diagnosis not present

## 2017-01-07 DIAGNOSIS — R2681 Unsteadiness on feet: Secondary | ICD-10-CM | POA: Diagnosis not present

## 2017-01-07 DIAGNOSIS — M25561 Pain in right knee: Secondary | ICD-10-CM | POA: Diagnosis not present

## 2017-01-07 NOTE — Progress Notes (Signed)
Subjective:    Patient ID: Cassandra Sims, female    DOB: 08-29-1932, 83 y.o.   MRN: [id number]  C.C.:  Follow-up for Cough Variant Asthma, Chronic Sinusitis, Right Lung Nodule, Left Hemidiaphragm Paralysis, & GERD.  HPI Patient last seen in our office in March with cough thought to be secondary to postnasal drainage from her chronic sinusitis.  Cough variant asthma: Patient also with some suggestion of underlying emphysema. Prescribed Singulair and Dulera one inhalation twice daily. She feels her coughing is "more pronounced". No exacerbations since last appointment. She reports rare wheezing. No nocturnal awakenings with any coughing, wheezing, or breathing problems. She reports rare use of her rescue inhaler.   Chronic sinusitis: Started on twice a day Benadryl at last appointment. Patient also previously taking Singulair and Claritin. She reports she felt she "itched" more on the Benadryl. She has continued to have clear sinus drainage that seems to be excessive. She denies any new sinus pressure or pain. She does clear her throat intermittently. She is using Flonase once daily and it does seem to help. She has since quit Claritin.   Right lung nodule: Seen on CT scan November 2017. Nodule measured 6 mm. Patient also with multiple other subcentimeter nodules some of which are calcified.  Left hemidiaphragm paralysis: Noted on sniff test. No obvious etiology on CT scan of the neck or chest. Previously declined referral to neurology.  GERD: Moderate based on esophagram. Patient previously taking probiotic & Zantac. Prescribed Pepcid 20 mg daily at bedtime last appointment. Does have frequent eructation. No reflux, dyspepsia, or morning brash water taste.   Review of Systems No chest pain, pressure, or tightness. No fever or chills. No abdominal pain or nausea.   Allergies  Allergen Reactions  . Penicillins Shortness Of Breath  . Adhesive [Tape] Itching  . Clindamycin/Lincomycin Itching    . Codeine Nausea And Vomiting  . Darvocet [Propoxyphene N-Acetaminophen]     HTN  . Dilaudid [Hydromorphone Hcl] Nausea And Vomiting  . Doxycycline     Pt unsure of reaction.   . Flagyl [Metronidazole]   . Lactose Intolerance (Gi)   . Levofloxacin     Joint pain  . Macrodantin [Nitrofurantoin Macrocrystal]   . Morphine And Related     hallucinate  . Premarin [Conjugated Estrogens] Itching  . Zantac [Ranitidine Hcl]     Itching   . Zofran [Ondansetron Hcl]     HTN  . Nitrofuran Derivatives Rash  . Sulfa Antibiotics Rash    Current Outpatient Prescriptions on File Prior to Visit  Medication Sig Dispense Refill  . acetaminophen (TYLENOL) 325 MG tablet Per bottle as needed for pain    . albuterol (PROVENTIL HFA;VENTOLIN HFA) 108 (90 BASE) MCG/ACT inhaler Inhale 2 puffs into the lungs every 4 (four) hours as needed for wheezing. Reported on 10/10/2015    . amLODipine (NORVASC) 10 MG tablet Take 5 mg by mouth daily.     . calcium-vitamin D (OSCAL WITH D) 500-200 MG-UNIT per tablet Take 1 tablet by mouth 2 (two) times daily.     . cholecalciferol (VITAMIN D) 1000 UNITS tablet Take 1,000 Units by mouth daily.    . famotidine (PEPCID) 20 MG tablet Take 1 tablet (20 mg total) by mouth 2 (two) times daily. 30 tablet 6  . guaiFENesin (MUCINEX) 600 MG 12 hr tablet Reported on 10/10/2015    . lactobacillus acidophilus (BACID) TABS Take 1 tablet by mouth daily.     . Misc Natural Products (LUTEIN  20) CAPS Take 1 capsule by mouth every morning.    . mometasone-formoterol (DULERA) 100-5 MCG/ACT AERO Take 2 puffs first thing in am and then another 2 puffs about 12 hours later. (Patient taking differently: Take 1 puff first thing in am and then another 1 puff about 12 hours later.) 3 Inhaler 3  . montelukast (SINGULAIR) 10 MG tablet Take 10 mg by mouth at bedtime.    . Multiple Vitamins-Minerals (CENTRUM SILVER PO) Take 1 tablet by mouth daily.     . psyllium (METAMUCIL) 58.6 % powder 1 tsp every  morning    . telmisartan (MICARDIS) 40 MG tablet Take 40 mg by mouth daily.    Marland Kitchen azithromycin (ZITHROMAX) 250 MG tablet Take as directed (Patient not taking: Reported on 01/08/2017) 6 tablet 0  . benzonatate (TESSALON) 100 MG capsule Take 1 capsule (100 mg total) by mouth 3 (three) times daily as needed for cough. (Patient not taking: Reported on 01/08/2017) 30 capsule 0   No current facility-administered medications on file prior to visit.     Past Medical History:  Diagnosis Date  . Arthritis   . Asthma   . Complication of anesthesia    hard to wake up  . GERD (gastroesophageal reflux disease)   . Glaucoma   . HOH (hard of hearing)   . Hypertension   . Multiple allergies   . Peripheral vascular disease (HCC)    varicose veins   . PONV (postoperative nausea and vomiting)   . Rhinitis   . Wears glasses     Past Surgical History:  Procedure Laterality Date  . ABDOMINAL HYSTERECTOMY    . COLECTOMY  2008   obstruction  . DIAGNOSTIC LAPAROSCOPY     exp  . DILATION AND CURETTAGE OF UTERUS    . FUNCTIONAL ENDOSCOPIC SINUS SURGERY  2009   bilat ethm,frontal,max  . NASAL SINUS SURGERY Bilateral 10/26/2012   Procedure: BILATERAL ENDOSCOPIC REVISION ETHMOID MAXILLARY AND FRONTAL SINUS SURGERY  ;  Surgeon: Izora Gala, MD;  Location: [facility];  Service: ENT;  Laterality: Bilateral;  . OVARIAN CYST SURGERY    . TUBAL LIGATION    . URETER SURGERY     cut accidentally during exp lap-ovarien cyst    Family History  Problem Relation Age of Onset  . Glaucoma Mother   . Arthritis Mother   . Lung disease Sister   . Breast cancer Daughter   . Diabetes Mellitus I Other     Social History   Social History  . Marital status: Divorced    Spouse name: N/A  . Number of children: N/A  . Years of education: N/A   Social History Main Topics  . Smoking status: Never Smoker  . Smokeless tobacco: Never Used  . Alcohol use No  . Drug use: No  . Sexual activity: Not Asked    Other Topics Concern  . None   Social History Narrative   Originally from [place]. Previously lived in [place] for 14 months. 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 No bird, mold, or hot tub exposure.       Objective:   Physical Exam BP 110/80 (BP Location: Right Arm, Patient Position: Sitting, Cuff Size: Large)   Pulse 67   Ht 5' 2.5" (1.588 m)   Wt 125 lb 3.2 oz (56.8 kg)   SpO2 94%   BMI 22.53 kg/m  General:  Awake. Alert. No distress. Elderly female. Integument:  Warm & dry. No rash  on exposed skin.  Extremities:  No cyanosis or clubbing.  HEENT:  Moist mucus membranes. Mild bilateral nasal turbinate swelling left greater than right with pale mucosa. Normal left tympanic membrane. Cardiovascular:  Regular rate. No edema.  Normal S1 & S2. Pulmonary:  Good aeration & clear to auscultation bilaterally. Symmetric chest wall expansion. No accessory muscle use on room air . Abdomen: Soft. Normal bowel sounds. Nondistended.  Musculoskeletal:  Normal bulk and tone. No joint deformity or effusion appreciated.  PFT 06/06/16: FVC 2.16 L (88%) FEV1 1.52 L (84%) FEV1/FVC 0.70 FEF 25-75 0.97 L (75%) negative bronchodilator response TLC 4.43 L (90%) RV 93% ERV 58% DLCO corrected 75% (Hgb 15.1)  08/04/12: FVC 2.81 L (110%) FEV1 1.87 L (107%) FEV1/FVC 0.67 FEF 25-75 0.16 L (57%) negative bronchodilator response TLC 4.33 L (95%) RV 79% ERV 87% DLCO uncorrected 93%  IMAGING CT CHEST W/O 12/17/16 (personally reviewed by me):  No change and 6 mm right lung nodule. Appears to have the same morphology. Multiple other subcentimeter nodules noted bilaterally some of which are calcified and without appreciated change. No pleural effusion or thickening. No pericardial effusion. No pathologic mediastinal adenopathy. Patient does have some compressive atelectasis at the left lung base with chronic elevation of the left hemidiaphragm.  CT CHEST W/ CONTRAST 06/19/16 (previously reviewed by me):  Mild  centrilobular emphysema. 6 mm nodule along the minor fissure likely subpleural lymph node. Calcified lung nodules also noted. No pleural effusion or thickening. No pericardial effusion. No pathologic mediastinal adenopathy. Low attenuation lesions in the liver measuring up to 2.3 cm likely cysts.  CT NECK/SOFT TISSUE W/ CONTRAST 06/19/16 (per radiologist):  Negative neck soft tissues. Generalized arterial tortuosity in the head and neck, but mild for age atherosclerosis. No left carotid space abnormality. Acute on chronic paranasal sinusitis status post maxillary antrostomies and ethmoidectomies. Symmetric nasal cavity mucosal thickening raising the possibility of rhinitis.   BARIUM SWALLOW/ESOPHAGRAM 04/05/16 (per radiologist):  Moderate gastroesophageal reflux. No esophageal dysmotility, stricture or mass.  SNIFF TEST 04/05/16 (per radiologist):  Mild paradoxical movement of the diaphragms with forceful inhalation is consistent with paralysis of the LEFT hemidiaphragm. Chronic elevation of LEFT diaphragm.  CXR PA/LAT 09/19/15 (previously reviewed by me):  Chronic elevation of left hemidiaphragm. No focal opacity. No effusion. Normal mediastinal contour and heart size.   LABS 03/29/16 CBC:  8.6/13.8/40.6/232 Eos:  0.6 IgE:  14 RAST Panel:  Negative     Assessment & Plan:  83 y.o. female with cough variant asthma, chronic sinusitis, right lung nodule, & GERD. emale previously followed for her chronic sinusitis and cough variant asthma. I reviewed the patient's CT scan with her today which showed bilateral lung nodules some of which are calcified on the left as well as continued stability in her right lower lobe nodule. We did discuss continuing to monitor for signs of progression with repeat CT imaging in 6 months, but given the low probability of malignancy the patient wishes to defer further imaging at this time. With some improvement in her chronic sinusitis after adding Flonase to her regimen I am  increasing the dose of her intranasal corticosteroid therapy. We also discussed restarting her antihistamine therapy with Claritin. Overall she has no symptoms from her reflux therefore I do not feel that adjustment is necessary in her Pepcid. Her asthma seems to be well-controlled at this time on her current dose of Dulera and I am holding on further de-escalation of inhaled corticosteroid therapy at this time. I instructed the  patient contact my office if she had any new breathing problems or questions before next appointment.  1. Cough variant asthma: Well controlled. Continuing Singulair & Dulera 100-5 one puff twice daily. No changes. 2. Chronic sinusitis: Suspect allergic component. Patient to continue using nasal saline rinse and Singulair. Restarting Claritin daily. Increasing Flonase to one spray each nostril twice daily. 3. Right lung nodule: No change in repeat CT imaging. Patient declines further imaging. 4. GERD: Controlled with Pepcid. No changes. 5. Health maintenance: Status post Influenza Vaccine November 2017, Prevnar November 2016, Pneumovax May 2010, & Tdap October 2012. 6. Follow-up: Return to clinic in 6 months or sooner if needed.  Sonia Baller Ashok Cordia, M.D. [facility] Pulmonary & Critical Care Pager:  [phone] After 3pm or if no response, call [phone] 10:28 AM 01/08/17

## 2017-01-08 ENCOUNTER — Encounter: Payer: Self-pay | Admitting: Pulmonary Disease

## 2017-01-08 ENCOUNTER — Ambulatory Visit (INDEPENDENT_AMBULATORY_CARE_PROVIDER_SITE_OTHER): Payer: Medicare Other | Admitting: Pulmonary Disease

## 2017-01-08 VITALS — BP 110/80 | HR 67 | Ht 62.5 in | Wt 125.2 lb

## 2017-01-08 DIAGNOSIS — J329 Chronic sinusitis, unspecified: Secondary | ICD-10-CM | POA: Diagnosis not present

## 2017-01-08 DIAGNOSIS — K219 Gastro-esophageal reflux disease without esophagitis: Secondary | ICD-10-CM | POA: Diagnosis not present

## 2017-01-08 DIAGNOSIS — R918 Other nonspecific abnormal finding of lung field: Secondary | ICD-10-CM | POA: Diagnosis not present

## 2017-01-08 DIAGNOSIS — J45991 Cough variant asthma: Secondary | ICD-10-CM

## 2017-01-08 NOTE — Patient Instructions (Addendum)
   Start using your Flonase nasal spray with 1 spray in each nostril twice daily.  You can restart your Claritin as well. Stay off the Benadryl. Continue taking the Singulair.   Continue using your other medications and inhalers as prescribed.   Let me know if your sinus drainage and symptoms don't improve after a week or two with these adjustments.   I will see you back in 6 months or sooner if needed.

## 2017-01-10 DIAGNOSIS — R2681 Unsteadiness on feet: Secondary | ICD-10-CM | POA: Diagnosis not present

## 2017-01-10 DIAGNOSIS — M25561 Pain in right knee: Secondary | ICD-10-CM | POA: Diagnosis not present

## 2017-01-14 ENCOUNTER — Ambulatory Visit (INDEPENDENT_AMBULATORY_CARE_PROVIDER_SITE_OTHER): Payer: Medicare Other | Admitting: Allergy and Immunology

## 2017-01-14 ENCOUNTER — Encounter: Payer: Self-pay | Admitting: Allergy and Immunology

## 2017-01-14 VITALS — BP 130/80 | HR 74 | Resp 18 | Ht 62.5 in | Wt 124.8 lb

## 2017-01-14 DIAGNOSIS — L989 Disorder of the skin and subcutaneous tissue, unspecified: Secondary | ICD-10-CM

## 2017-01-14 DIAGNOSIS — R2681 Unsteadiness on feet: Secondary | ICD-10-CM | POA: Diagnosis not present

## 2017-01-14 DIAGNOSIS — L308 Other specified dermatitis: Secondary | ICD-10-CM | POA: Diagnosis not present

## 2017-01-14 DIAGNOSIS — M25561 Pain in right knee: Secondary | ICD-10-CM | POA: Diagnosis not present

## 2017-01-14 DIAGNOSIS — J324 Chronic pansinusitis: Secondary | ICD-10-CM | POA: Diagnosis not present

## 2017-01-14 DIAGNOSIS — J4540 Moderate persistent asthma, uncomplicated: Secondary | ICD-10-CM | POA: Diagnosis not present

## 2017-01-14 NOTE — Progress Notes (Signed)
Dear Dr. Valora Piccolo,  Thank you for referring Cassandra Sims to the [facility] on 01/14/2017.   Below is a summation of this patient's evaluation and recommendations.  Thank you for your referral. I will keep you informed about this patient's response to treatment.   If you have any questions please do not hesitate to contact me.   Sincerely,  Jiles Prows, MD Allergy / Immunology [place]   ______________________________________________________________________    NEW PATIENT NOTE  Referring Provider: Lilian Coma., MD Primary Provider: Lilian Coma., MD Date of office visit: 01/14/2017    Subjective:   Chief Complaint:  Cassandra Sims (DOB: 01-18-1933) is a 83 y.o. female who presents to the clinic on 01/14/2017 with a chief complaint of Pruritus .     HPI: Cassandra Sims presents to this clinic in evaluation of itchiness.  Apparently she has a long history of itchiness affecting her right scapular region of many years duration that has been addressed by multiple individuals including a dermatologist and has been treated with multiple therapies including what sounds like acupuncture without any adequate response. In addition, she has a large patch affecting her left abdomen that also has a similar pattern. This March she developed a rather significant pruritic episode after a surgery was performed that lasted about one month and sounded as though it did require the administration of systemic steroids but had completely resolved. There is no obvious provoking factor giving rise to this issue. She does not have any associated systemic or constitutional symptoms.  She does have rather significant respiratory tract disease and is followed by a pulmonologist and an ENT doctor for chronic sinusitis and asthma and chronic cough. Apparently she has required two sinus surgeries with her last surgery  being performed by Dr. Constance Holster approximately 5 years ago. She consistently uses nasal steroids and montelukast and an inhaled steroid and long-acting bronchodilator and rarely has a requirement for short acting bronchodilator. She is also treated empirically for reflux.  She has complete anosmia and has so for years. She has constant nasal congestion and postnasal drip and throat clearing and raspy voice. She has a chronic cough without a significant amount of shortness of breath or chest tightness.  Past Medical History:  Diagnosis Date  . Arthritis   . Asthma   . Complication of anesthesia    hard to wake up  . GERD (gastroesophageal reflux disease)   . Glaucoma   . HOH (hard of hearing)   . Hypertension   . Multiple allergies   . Peripheral vascular disease (HCC)    varicose veins   . PONV (postoperative nausea and vomiting)   . Rhinitis   . Wears glasses     Past Surgical History:  Procedure Laterality Date  . ABDOMINAL HYSTERECTOMY    . COLECTOMY  2008   obstruction  . DIAGNOSTIC LAPAROSCOPY     exp  . DILATION AND CURETTAGE OF UTERUS    . FUNCTIONAL ENDOSCOPIC SINUS SURGERY  2009   bilat ethm,frontal,max  . NASAL SINUS SURGERY Bilateral 10/26/2012   Procedure: BILATERAL ENDOSCOPIC REVISION ETHMOID MAXILLARY AND FRONTAL SINUS SURGERY  ;  Surgeon: Izora Gala, MD;  Location: [facility];  Service: ENT;  Laterality: Bilateral;  . OVARIAN CYST SURGERY    . TUBAL LIGATION    . URETER SURGERY     cut accidentally during exp lap-ovarien cyst  Allergies as of 01/14/2017      Reactions   Penicillins Shortness Of Breath   Adhesive [tape] Itching   Clindamycin/lincomycin Itching   Codeine Nausea And Vomiting   Darvocet [propoxyphene N-acetaminophen]    HTN   Dilaudid [hydromorphone Hcl] Nausea And Vomiting   Doxycycline    Pt unsure of reaction.    Flagyl [metronidazole]    Lactose Intolerance (gi)    Levofloxacin    Joint pain   Macrodantin  [nitrofurantoin Macrocrystal]    Morphine And Related    hallucinate   Premarin [conjugated Estrogens] Itching   Zantac [ranitidine Hcl]    Itching    Zofran [ondansetron Hcl]    HTN   Nitrofuran Derivatives Rash   Sulfa Antibiotics Rash      Medication List      acetaminophen 325 MG tablet Commonly known as:  TYLENOL Per bottle as needed for pain   albuterol 108 (90 Base) MCG/ACT inhaler Commonly known as:  PROVENTIL HFA;VENTOLIN HFA Inhale 2 puffs into the lungs every 4 (four) hours as needed for wheezing. Reported on 10/10/2015   amLODipine 10 MG tablet Commonly known as:  NORVASC Take 5 mg by mouth daily.   calcium-vitamin D 500-200 MG-UNIT tablet Commonly known as:  OSCAL WITH D Take 1 tablet by mouth 2 (two) times daily.   CENTRUM SILVER PO Take 1 tablet by mouth daily.   cholecalciferol 1000 units tablet Commonly known as:  VITAMIN D Take 1,000 Units by mouth daily.   famotidine 20 MG tablet Commonly known as:  PEPCID Take 1 tablet (20 mg total) by mouth 2 (two) times daily.   fluticasone 50 MCG/ACT nasal spray Commonly known as:  FLONASE Place 1 spray into both nostrils 2 (two) times daily.   LUMIGAN 0.01 % Soln Generic drug:  bimatoprost Place 1 drop into both eyes every evening.   LUTEIN 20 Caps Take 1 capsule by mouth every morning.   mometasone-formoterol 100-5 MCG/ACT Aero Commonly known as:  DULERA Take 2 puffs first thing in am and then another 2 puffs about 12 hours later.   montelukast 10 MG tablet Commonly known as:  SINGULAIR Take 10 mg by mouth at bedtime.   psyllium 58.6 % powder Commonly known as:  METAMUCIL 1 tsp every morning   telmisartan 40 MG tablet Commonly known as:  MICARDIS Take 40 mg by mouth daily.       Review of systems negative except as noted in HPI / PMHx or noted below:  Review of Systems  Constitutional: Negative.   HENT: Negative.   Eyes: Negative.   Respiratory: Negative.   Cardiovascular:  Negative.   Gastrointestinal: Negative.   Genitourinary: Negative.   Musculoskeletal: Negative.   Skin: Negative.   Neurological: Negative.   Endo/Heme/Allergies: Negative.   Psychiatric/Behavioral: Negative.     Family History  Problem Relation Age of Onset  . Glaucoma Mother   . Arthritis Mother   . Lung disease Sister   . Breast cancer Daughter   . Diabetes Mellitus I Other     Social History   Social History  . Marital status: Divorced    Spouse name: N/A  . Number of children: N/A  . Years of education: N/A   Occupational History  . Not on file.   Social History Main Topics  . Smoking status: Never Smoker  . Smokeless tobacco: Never Used  . Alcohol use No  . Drug use: No  . Sexual activity: Not on file  Other Topics Concern  . Not on file   Social History Narrative   Originally from [place]. Previously lived in [place] for 14 months. 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 No bird, mold, or hot tub exposure.     Environmental and Social history  Lives in a townhouse with a dry environment, no animals located inside the household, no carpeting in the bedroom, plastic on the bed, plastic on the pillow, and no smokers located inside the household.  Objective:   Vitals:   01/14/17 1338  BP: 130/80  Pulse: 74  Resp: 18   Height: 5' 2.5" (158.8 cm) Weight: 124 lb 12.8 oz (56.6 kg)  Physical Exam  Constitutional: She is well-developed, well-nourished, and in no distress.  Nasal raspy voice, throat clearing, slight cough  HENT:  Head: Normocephalic. Head is without right periorbital erythema and without left periorbital erythema.  Right Ear: Tympanic membrane, external ear and ear canal normal.  Left Ear: Tympanic membrane, external ear and ear canal normal.  Nose: Mucosal edema present. No rhinorrhea.  Mouth/Throat: Uvula is midline, oropharynx is clear and moist and mucous membranes are normal. No oropharyngeal exudate.  Eyes: Conjunctivae and  lids are normal. Pupils are equal, round, and reactive to light.  Neck: Trachea normal. No tracheal tenderness present. No tracheal deviation present. No thyromegaly present.  Cardiovascular: Normal rate, regular rhythm, S1 normal, S2 normal and normal heart sounds.   No murmur heard. Pulmonary/Chest: Effort normal and breath sounds normal. No stridor. No tachypnea. No respiratory distress. She has no wheezes. She has no rales. She exhibits no tenderness.  Abdominal: Soft. She exhibits no distension and no mass. There is no hepatosplenomegaly. There is no tenderness. There is no rebound and no guarding.  Musculoskeletal: She exhibits no edema or tenderness.  Lymphadenopathy:       Head (right side): No tonsillar adenopathy present.       Head (left side): No tonsillar adenopathy present.    She has no cervical adenopathy.    She has no axillary adenopathy.  Neurological: She is alert. Gait normal.  Skin: Rash (large patch left abdomen of scaly erythematous indurated skin. Multiple seborrheic keratoses across trunk.) noted. She is not diaphoretic. No erythema. No pallor. Nails show no clubbing.  Psychiatric: Mood and affect normal.    Diagnostics: Allergy skin tests were performed. She did not demonstrate any hypersensitivity against a screening panel of aeroallergens or foods.  Spirometry was performed and demonstrated an FEV1 of 1.22 @ 67 % of predicted.  The patient had an Asthma Control Test with the following results: ACT Total Score: 22.    Results of blood tests obtained 03/29/2016 identified a white blood cell count of 8.6 with an absolute eosinophil count of 600, lymphocyte count 2000, hemoglobin 13.8, platelet 232. She had a negative Aero allergen profile with no evidence of allergen specific IgE antibodies. Her total IgE level was 14 kU/L.  Results of blood tests obtained on 11/11/2016 identified a albumin of 4.0 MG/DL and total protein 6.5 MG/DL   Assessment and Plan:    1.  Inflammatory dermatosis   2. Chronic pansinusitis   3. Asthma, moderate persistent, well-controlled     1. Allergen avoidance measures?  2. Use a combination of Elidel followed by mometasone 0.1% ointment to abdomen rash twice a day  3. Blood - IgA/G/M, anti-pneumococcal ab, antitetanus ab, SPEP with immunofixation.  4. Continue Flonase and montelukast and Dulera  5. Return to clinic in 4 weeks  or earlier if problem  Cassandra Sims has some form of inflammatory dermatosis affecting her left abdomen and we will treat her with a combination of a calcineurin inhibitor and a topical steroid. I do not know the etiology of the issue with itchiness affecting her right scapular region. This may be a nerve issue rather than a true inflammatory process. As well, she appears to have chronic sinusitis that has required 2 surgeries and I am going to check her immune system to make sure that she can mount a antigen specific antibody response. She does appear to have a low globulin level based upon her total protein of 6.5 with an albumin of 4 collected earlier this year. She will remain on anti-inflammatory agents for her respiratory tract as directed by her pulmonologist. I will regroup with her in 4 weeks or earlier if there is a problem.  Jiles Prows, MD [facility]

## 2017-01-14 NOTE — Patient Instructions (Addendum)
  1. Allergen avoidance measures?  2. Use a combination of Elidel followed by mometasone 0.1% ointment to abdomen rash twice a day  3. Blood - IgA/G/M, anti-pneumococcal ab, antitetanus ab, SPEP with immunofixation.  4. Continue Flonase and montelukast and Dulera  5. Return to clinic in 4 weeks or earlier if problem

## 2017-01-15 DIAGNOSIS — J324 Chronic pansinusitis: Secondary | ICD-10-CM | POA: Diagnosis not present

## 2017-01-15 DIAGNOSIS — L308 Other specified dermatitis: Secondary | ICD-10-CM | POA: Diagnosis not present

## 2017-01-16 ENCOUNTER — Telehealth: Payer: Self-pay

## 2017-01-16 ENCOUNTER — Telehealth: Payer: Self-pay | Admitting: Allergy and Immunology

## 2017-01-16 MED ORDER — MOMETASONE FUROATE 0.1 % EX OINT: TOPICAL_OINTMENT | Freq: Two times a day (BID) | CUTANEOUS | 3 refills | Status: DC

## 2017-01-16 MED ORDER — PIMECROLIMUS 1 % EX CREA: TOPICAL_CREAM | Freq: Two times a day (BID) | CUTANEOUS | 3 refills | Status: DC

## 2017-01-16 MED ORDER — TACROLIMUS 0.1 % EX OINT: TOPICAL_OINTMENT | Freq: Two times a day (BID) | CUTANEOUS | 5 refills | Status: DC

## 2017-01-16 NOTE — Telephone Encounter (Signed)
Called Patient. I informed her that I sent the Elidel and Mometasone 0.1% ointment to the Pharmacy.

## 2017-01-16 NOTE — Telephone Encounter (Signed)
Pharmacy sent a fax over stated that the elidel that you ordered is not covered by her insurance. The cost out of pocket is $209 and the didn't give a alternative medication.  Please advise

## 2017-01-16 NOTE — Telephone Encounter (Signed)
ASK [facility] HOW MUCH pROTOPIC WILL COST WITH HER INSURANCE?

## 2017-01-16 NOTE — Telephone Encounter (Signed)
Sent in protopic.  

## 2017-01-16 NOTE — Telephone Encounter (Signed)
Medication sent in and patient aware.

## 2017-01-16 NOTE — Addendum Note (Signed)
Addended by: York Grice on: 01/16/2017 03:16 PM   Modules accepted: Orders

## 2017-01-16 NOTE — Telephone Encounter (Signed)
Pt called and was here Tuesday and no meds was called into [facility] on [phone]

## 2017-01-17 DIAGNOSIS — R2681 Unsteadiness on feet: Secondary | ICD-10-CM | POA: Diagnosis not present

## 2017-01-17 DIAGNOSIS — M25561 Pain in right knee: Secondary | ICD-10-CM | POA: Diagnosis not present

## 2017-01-22 LAB — PNEUMOCOCCAL AB (23 SEROTYPE)
PNEUMO AB TYPE 17 (17F): 12.3 ug/mL (ref >1.3)
PNEUMO AB TYPE 19 (19F): 13.5 ug/mL (ref >1.3)
PNEUMO AB TYPE 20: 2.7 ug/mL (ref >1.3)
PNEUMO AB TYPE 22 (22F): 3 ug/mL (ref >1.3)
PNEUMO AB TYPE 26 (6B): 2.8 ug/mL (ref >1.3)
PNEUMO AB TYPE 34 (10A): 2.9 ug/mL (ref >1.3)
PNEUMO AB TYPE 54 (15B): 4.4 ug/mL (ref >1.3)
PNEUMO AB TYPE 57 (19A): 9.7 ug/mL (ref >1.3)
PNEUMO AB TYPE 68 (9V): 12 ug/mL (ref >1.3)
PNEUMO AB TYPE 8: 1.3 ug/mL — AB (ref >1.3)
Pneumo Ab Type 1*: 2 ug/mL (ref >1.3)
Pneumo Ab Type 12 (12F)*: 0.6 ug/mL — ABNORMAL LOW (ref >1.3)
Pneumo Ab Type 14*: 5.3 ug/mL (ref >1.3)
Pneumo Ab Type 2*: 2.2 ug/mL (ref >1.3)
Pneumo Ab Type 23 (23F)*: 8.8 ug/mL (ref >1.3)
Pneumo Ab Type 3*: 0.8 ug/mL — ABNORMAL LOW (ref >1.3)
Pneumo Ab Type 4*: 1.8 ug/mL (ref >1.3)
Pneumo Ab Type 43 (11A)*: 4 ug/mL (ref >1.3)
Pneumo Ab Type 5*: 15.6 ug/mL (ref >1.3)
Pneumo Ab Type 51 (7F)*: 1.9 ug/mL (ref >1.3)
Pneumo Ab Type 56 (18C)*: 24.2 ug/mL (ref >1.3)
Pneumo Ab Type 70 (33F)*: 3.3 ug/mL (ref >1.3)
Pneumo Ab Type 9 (9N)*: 2.8 ug/mL (ref >1.3)

## 2017-01-22 LAB — PROTEIN ELECTROPHORESIS, SERUM
A/G RATIO SPE: 1.4 (ref 0.7–1.7)
Albumin ELP: 4 g/dL (ref 2.9–4.4)
Alpha 1: 0.2 g/dL (ref 0.0–0.4)
Alpha 2: 0.7 g/dL (ref 0.4–1.0)
BETA: 1.1 g/dL (ref 0.7–1.3)
GLOBULIN, TOTAL: 2.8 g/dL (ref 2.2–3.9)
Gamma Globulin: 0.7 g/dL (ref 0.4–1.8)
Total Protein: 6.8 g/dL (ref 6.0–8.5)

## 2017-01-22 LAB — TETANUS ANTIBODY, IGG: Tetanus Ab, IgG: 0.11 IU/mL (ref <0.10)

## 2017-01-22 LAB — IGG, IGA, IGM
IGA/IMMUNOGLOBULIN A, SERUM: 197 mg/dL (ref 64–422)
IGM (IMMUNOGLOBULIN M), SRM: 145 mg/dL (ref 26–217)
IgG (Immunoglobin G), Serum: 648 mg/dL — ABNORMAL LOW (ref 700–1600)

## 2017-01-23 DIAGNOSIS — M25561 Pain in right knee: Secondary | ICD-10-CM | POA: Diagnosis not present

## 2017-01-23 DIAGNOSIS — R2681 Unsteadiness on feet: Secondary | ICD-10-CM | POA: Diagnosis not present

## 2017-01-28 DIAGNOSIS — Z1231 Encounter for screening mammogram for malignant neoplasm of breast: Secondary | ICD-10-CM | POA: Diagnosis not present

## 2017-01-30 DIAGNOSIS — N6489 Other specified disorders of breast: Secondary | ICD-10-CM | POA: Diagnosis not present

## 2017-01-30 DIAGNOSIS — N6320 Unspecified lump in the left breast, unspecified quadrant: Secondary | ICD-10-CM | POA: Diagnosis not present

## 2017-02-04 DIAGNOSIS — R7303 Prediabetes: Secondary | ICD-10-CM | POA: Diagnosis not present

## 2017-02-04 DIAGNOSIS — E785 Hyperlipidemia, unspecified: Secondary | ICD-10-CM | POA: Diagnosis not present

## 2017-02-04 DIAGNOSIS — M1711 Unilateral primary osteoarthritis, right knee: Secondary | ICD-10-CM | POA: Diagnosis not present

## 2017-02-04 DIAGNOSIS — I10 Essential (primary) hypertension: Secondary | ICD-10-CM | POA: Diagnosis not present

## 2017-02-04 DIAGNOSIS — Z23 Encounter for immunization: Secondary | ICD-10-CM | POA: Diagnosis not present

## 2017-02-10 DIAGNOSIS — M1711 Unilateral primary osteoarthritis, right knee: Secondary | ICD-10-CM | POA: Diagnosis not present

## 2017-02-26 ENCOUNTER — Telehealth: Payer: Self-pay

## 2017-02-26 NOTE — Telephone Encounter (Signed)
Patient called in c/o severe itching. I did go over her discharge  From last office visit and she is using the medications as prescribed. I offered appointment with Dr. Ernst Bowler later on this afternoon, to which she declined. I also offered an  appointment and she wanted to be seen in [place] by Dr. Neldon Mc. I offered the appointment again with Dr. Ernst Bowler and she said that she would just wait until next week and see Dr. Neldon Mc. I did advise patient to use the elocon prescribed for the itching and maybe try a benadryl if she is not allergic.

## 2017-03-05 ENCOUNTER — Ambulatory Visit (INDEPENDENT_AMBULATORY_CARE_PROVIDER_SITE_OTHER): Payer: Medicare Other | Admitting: Allergy and Immunology

## 2017-03-05 VITALS — BP 162/90 | HR 72 | Temp 98.1°F | Resp 20

## 2017-03-05 DIAGNOSIS — J4540 Moderate persistent asthma, uncomplicated: Secondary | ICD-10-CM

## 2017-03-05 DIAGNOSIS — L989 Disorder of the skin and subcutaneous tissue, unspecified: Secondary | ICD-10-CM

## 2017-03-05 DIAGNOSIS — L308 Other specified dermatitis: Secondary | ICD-10-CM | POA: Diagnosis not present

## 2017-03-05 DIAGNOSIS — J324 Chronic pansinusitis: Secondary | ICD-10-CM

## 2017-03-05 DIAGNOSIS — D721 Eosinophilia, unspecified: Secondary | ICD-10-CM

## 2017-03-05 MED ORDER — METHYLPREDNISOLONE ACETATE 80 MG/ML IJ SUSP
80.0000 mg | Freq: Once | INTRAMUSCULAR | Status: AC
  Administered 2017-03-05: 80 mg via INTRAMUSCULAR

## 2017-03-05 NOTE — Patient Instructions (Addendum)
  1. Do not scratch or itch skin  2. Use a combination of Elidel followed by mometasone 0.1% ointment to dematitis twice a day  3. Depomedrol 80 IM delivered in clinic today  4. Cetirizine 10mg  two times per day  5. Need biopsy results from Dr. Baltazar Najjar in [facility]  6. Submit for [employer] approval  7. Continue Flonase and montelukast and Dulera and albuterol  8. Return to clinic in 12 weeks or earlier if problem  9. Obtain fall flu vaccine

## 2017-03-05 NOTE — Progress Notes (Signed)
Follow-up Note  Referring Provider: Lilian Coma., MD Primary Provider: Lilian Coma., MD Date of Office Visit: 03/05/2017  Subjective:   Cassandra Sims (DOB: Jan 01, 1933) is a 83 y.o. female who returns to the [facility] on 03/05/2017 in re-evaluation of the following:  HPI: Camry returns to this clinic in evaluation of her inflammatory dermatosis addressed during her initial evaluation of 01/14/2017.  She is really not that much better. She believes that the scale and itchiness on her abdomen has responded quite well to the topical therapy. However, her back and the rest of her body is extremely itchy and she still has intermittent red areas with her new area occurring on her right forearm the past month.  Her respiratory tract disease appears to be under good control at this point and she continues on the anti-inflammatory medications for both her upper and lower airway as assigned by her pulmonologist and ENT doctor.  Allergies as of 03/05/2017      Reactions   Penicillins Shortness Of Breath   Adhesive [tape] Itching   Clindamycin/lincomycin Itching   Codeine Nausea And Vomiting   Darvocet [propoxyphene N-acetaminophen]    HTN   Dilaudid [hydromorphone Hcl] Nausea And Vomiting   Doxycycline    Pt unsure of reaction.    Flagyl [metronidazole]    Lactose Intolerance (gi)    Levofloxacin    Joint pain   Macrodantin [nitrofurantoin Macrocrystal]    Morphine And Related    hallucinate   Premarin [conjugated Estrogens] Itching   Zantac [ranitidine Hcl]    Itching    Zofran [ondansetron Hcl]    HTN   Nitrofuran Derivatives Rash   Sulfa Antibiotics Rash      Medication List      acetaminophen 325 MG tablet Commonly known as:  TYLENOL Per bottle as needed for pain   albuterol 108 (90 Base) MCG/ACT inhaler Commonly known as:  PROVENTIL HFA;VENTOLIN HFA Inhale 2 puffs into the lungs every 4 (four) hours as needed for wheezing. Reported on  10/10/2015   amLODipine 5 MG tablet Commonly known as:  NORVASC   calcium-vitamin D 500-200 MG-UNIT tablet Commonly known as:  OSCAL WITH D Take 1 tablet by mouth 2 (two) times daily.   CENTRUM SILVER PO Take 1 tablet by mouth daily.   cholecalciferol 1000 units tablet Commonly known as:  VITAMIN D Take 1,000 Units by mouth daily.   famotidine 20 MG tablet Commonly known as:  PEPCID Take 1 tablet (20 mg total) by mouth 2 (two) times daily.   fluticasone 50 MCG/ACT nasal spray Commonly known as:  FLONASE Place 1 spray into both nostrils 2 (two) times daily.   LUMIGAN 0.01 % Soln Generic drug:  bimatoprost Place 1 drop into both eyes every evening.   LUTEIN 20 Caps Take 1 capsule by mouth every morning.   meloxicam 7.5 MG tablet Commonly known as:  MOBIC   mometasone 0.1 % ointment Commonly known as:  ELOCON Apply topically 2 (two) times daily.   mometasone-formoterol 100-5 MCG/ACT Aero Commonly known as:  DULERA Take 2 puffs first thing in am and then another 2 puffs about 12 hours later.   montelukast 10 MG tablet Commonly known as:  SINGULAIR Take 10 mg by mouth at bedtime.   pimecrolimus 1 % cream Commonly known as:  ELIDEL Apply topically 2 (two) times daily.   psyllium 58.6 % powder Commonly known as:  METAMUCIL 1 tsp every morning   SYSTANE  OP Apply to eye.   tacrolimus 0.1 % ointment Commonly known as:  PROTOPIC Apply topically 2 (two) times daily.   telmisartan 40 MG tablet Commonly known as:  MICARDIS Take 40 mg by mouth daily.       Past Medical History:  Diagnosis Date  . Arthritis   . Asthma   . Complication of anesthesia    hard to wake up  . GERD (gastroesophageal reflux disease)   . Glaucoma   . HOH (hard of hearing)   . Hypertension   . Multiple allergies   . Peripheral vascular disease (HCC)    varicose veins   . PONV (postoperative nausea and vomiting)   . Rhinitis   . Wears glasses     Past Surgical History:    Procedure Laterality Date  . ABDOMINAL HYSTERECTOMY    . COLECTOMY  2008   obstruction  . DIAGNOSTIC LAPAROSCOPY     exp  . DILATION AND CURETTAGE OF UTERUS    . FUNCTIONAL ENDOSCOPIC SINUS SURGERY  2009   bilat ethm,frontal,max  . NASAL SINUS SURGERY Bilateral 10/26/2012   Procedure: BILATERAL ENDOSCOPIC REVISION ETHMOID MAXILLARY AND FRONTAL SINUS SURGERY  ;  Surgeon: Izora Gala, MD;  Location: [facility];  Service: ENT;  Laterality: Bilateral;  . OVARIAN CYST SURGERY    . TUBAL LIGATION    . URETER SURGERY     cut accidentally during exp lap-ovarien cyst    Review of systems negative except as noted in HPI / PMHx or noted below:  Review of Systems  Constitutional: Negative.   HENT: Negative.   Eyes: Negative.   Respiratory: Negative.   Cardiovascular: Negative.   Gastrointestinal: Negative.   Genitourinary: Negative.   Musculoskeletal: Negative.   Skin: Negative.   Neurological: Negative.   Endo/Heme/Allergies: Negative.   Psychiatric/Behavioral: Negative.      Objective:   Vitals:   03/05/17 1014  BP: (!) 162/90  Pulse: 72  Resp: 20  Temp: 98.1 F (36.7 C)  SpO2: 97%          Physical Exam  Constitutional: She is well-developed, well-nourished, and in no distress.  HENT:  Head: Normocephalic.  Right Ear: Tympanic membrane, external ear and ear canal normal.  Left Ear: Tympanic membrane, external ear and ear canal normal.  Nose: Nose normal. No mucosal edema or rhinorrhea.  Mouth/Throat: Uvula is midline, oropharynx is clear and moist and mucous membranes are normal. No oropharyngeal exudate.  Eyes: Conjunctivae are normal.  Neck: Trachea normal. No tracheal tenderness present. No tracheal deviation present. No thyromegaly present.  Cardiovascular: Normal rate, regular rhythm, S1 normal, S2 normal and normal heart sounds.   No murmur heard. Pulmonary/Chest: Breath sounds normal. No stridor. No respiratory distress. She has no wheezes.  She has no rales.  Musculoskeletal: She exhibits no edema.  Lymphadenopathy:       Head (right side): No tonsillar adenopathy present.       Head (left side): No tonsillar adenopathy present.    She has no cervical adenopathy.  Neurological: She is alert. Gait normal.  Skin: Rash noted. She is not diaphoretic. No erythema. Nails show no clubbing.  Large erythematous patch affecting left abdomen without any scale, multiple patches of scaly erythematous skin involving back, large 10 x 5 cm patch involving right forearm, multiple seborrheic keratoses across trunk.  Psychiatric: Mood and affect normal.    Diagnostics: Results of blood tests obtained 01/15/2017 identified IgG 648, IgA 197, IgM 145 MG/DL, tenderness IgG  antibody at 0.11 international units/mL, multiple serotypes of pneumococcus with adequate antibody levels, and normal SPEP without M spike.  Results of blood tests obtained 03/29/2016 identified a white blood cell count of 8.6 with an absolute eosinophil count of 600, absolute lymphocyte count 2000, hemoglobin 13.8, platelet 232   Spirometry was performed and demonstrated an FEV1 of 1.32 at 79 % of predicted.  The patient had an Asthma Control Test with the following results: ACT Total Score: 23.    Assessment and Plan:   1. Inflammatory dermatosis   2. Chronic pansinusitis   3. Asthma, moderate persistent, well-controlled   4. Eosinophilia     1. Do not scratch or itch skin  2. Use a combination of Elidel followed by mometasone 0.1% ointment to dematitis twice a day  3. Depomedrol 80 IM delivered in clinic today  4. Cetirizine 10mg  two times per day  5. Need biopsy results from Dr. Baltazar Najjar in [facility]  6. Submit for [employer] approval  7. Continue Flonase and montelukast and Dulera and albuterol  8. Return to clinic in 12 weeks or earlier if problem  9. Obtain fall flu vaccine   Cassandra Sims has a inflammatory dermatosis associated with eosinophilia and an  atopic history in the past and I think that she may be a candidate for dupilumab administration if we can get this approved by her insurance company. For today I did give her a systemic steroid and she will use cetirizine twice a day and continue on topical therapy. I need to review the biopsy results from Dr. Baltazar Najjar that had been performed in the past in investigation of her issue to make sure that we are dealing with a form of eczematous dermatitis and not some other etiologic factor. I will regroup with her in 12 weeks or earlier if there is a problem.  Allena Katz, MD Allergy / Immunology [place]

## 2017-03-06 ENCOUNTER — Telehealth: Payer: Self-pay | Admitting: Allergy and Immunology

## 2017-03-06 ENCOUNTER — Encounter: Payer: Self-pay | Admitting: Allergy and Immunology

## 2017-03-06 NOTE — Telephone Encounter (Signed)
Please inform patient that she can use her hydroxyzine 10 mg tablets up to 3 times a day.

## 2017-03-06 NOTE — Telephone Encounter (Signed)
Pt called and said that she got a predinose injection yesterday and she is still itching and has some hydroxyzine 10 mg from another doctor and wanted to know if she can take it .[phone].

## 2017-03-06 NOTE — Telephone Encounter (Signed)
Spoke to patient advised of written below. Patient wanted to know if we could send something in for anxeity or help her sleep. I advised that she needs to call her pcp to get that prescribed since we do not give them here. Patient verbalized understanding.

## 2017-03-07 ENCOUNTER — Telehealth: Payer: Self-pay | Admitting: *Deleted

## 2017-03-07 NOTE — Telephone Encounter (Signed)
-----   Message from Rosalio Loud, [place] sent at 03/05/2017 11:10 AM EDT ----- Regarding: Dupixent Dr. Neldon Mc would like the patient to begin [place] if insurance will cover.

## 2017-03-07 NOTE — Telephone Encounter (Signed)
Called patient and discussed Cassandra Sims cost with patient.  She does have donut hole that would make this drug too expensive for her.  I advised her there is a patient assistance program for atopic dermatitis but is fully allocated at this time.  I advised her I would keep an eye out and if assistance becomes available will give her a call back.  She advised she is still really itchy but was not taking medications as directed by Dr Neldon Mc. Advised her to increase her Cetirizine to twice daily since she was only taking once daily and made her a followup appt.  I told patient if she was still not seeing change in her symptoms to give Cassandra Sims a call next week.

## 2017-03-12 ENCOUNTER — Telehealth: Payer: Self-pay | Admitting: Allergy and Immunology

## 2017-03-12 ENCOUNTER — Telehealth: Payer: Self-pay | Admitting: *Deleted

## 2017-03-12 DIAGNOSIS — H401113 Primary open-angle glaucoma, right eye, severe stage: Secondary | ICD-10-CM | POA: Diagnosis not present

## 2017-03-12 DIAGNOSIS — H401122 Primary open-angle glaucoma, left eye, moderate stage: Secondary | ICD-10-CM | POA: Diagnosis not present

## 2017-03-12 MED ORDER — LORATADINE 10 MG PO TABS: 10.0000 mg | ORAL_TABLET | Freq: Every day | ORAL | 5 refills | Status: DC

## 2017-03-12 NOTE — Telephone Encounter (Signed)
Already discussed Dupixent with patient and unfortunately due to her part D she will not be able to afford since she has a donut hole in her plan

## 2017-03-12 NOTE — Telephone Encounter (Signed)
Pt is complaining of drowsiness from taking cetirizine twice daily. Informed her to try claritin or allegra in the morning and cetirizine at night. Told her that Tammy would call her back if she finds any assistance for the dupixent.

## 2017-03-12 NOTE — Telephone Encounter (Signed)
Pt called and would like for Dr. Neldon Mc nurse to call her about the allergy pill he put her on and about getting the shots [phone].

## 2017-03-12 NOTE — Telephone Encounter (Signed)
Cassandra Sims had spoken to patient last week about increasing the cetirizine to bid to see if that helped with the itching. Also about Dupixent injections. I left a message for her to call back to discuss any further questions that she may have.

## 2017-03-13 NOTE — Telephone Encounter (Signed)
Logan in [place] already discussed with patient alternating different antihistamines due to drowsiness

## 2017-03-21 DIAGNOSIS — H40011 Open angle with borderline findings, low risk, right eye: Secondary | ICD-10-CM | POA: Diagnosis not present

## 2017-04-09 ENCOUNTER — Telehealth: Payer: Self-pay | Admitting: *Deleted

## 2017-04-09 NOTE — Telephone Encounter (Signed)
Patient called and advised that she is having a lot of dry eye symptoms and fatigue and wanted suggestion for same.  She is currently taking Allegra am and Cetirizine pm.  I advised her she can try the Allegra twice daily instead of taking pm cetirizine and see if it helps symptoms. If she still having problems let us know

## 2017-04-17 DIAGNOSIS — H401122 Primary open-angle glaucoma, left eye, moderate stage: Secondary | ICD-10-CM | POA: Diagnosis not present

## 2017-04-17 DIAGNOSIS — H401113 Primary open-angle glaucoma, right eye, severe stage: Secondary | ICD-10-CM | POA: Diagnosis not present

## 2017-04-23 DIAGNOSIS — L600 Ingrowing nail: Secondary | ICD-10-CM | POA: Diagnosis not present

## 2017-05-06 DIAGNOSIS — M25511 Pain in right shoulder: Secondary | ICD-10-CM | POA: Diagnosis not present

## 2017-05-09 ENCOUNTER — Encounter: Payer: Self-pay | Admitting: Physical Therapy

## 2017-05-09 ENCOUNTER — Ambulatory Visit: Payer: Medicare Other | Attending: Orthopaedic Surgery | Admitting: Physical Therapy

## 2017-05-09 DIAGNOSIS — M25511 Pain in right shoulder: Secondary | ICD-10-CM | POA: Diagnosis not present

## 2017-05-09 DIAGNOSIS — R252 Cramp and spasm: Secondary | ICD-10-CM | POA: Insufficient documentation

## 2017-05-09 DIAGNOSIS — M25611 Stiffness of right shoulder, not elsewhere classified: Secondary | ICD-10-CM | POA: Diagnosis not present

## 2017-05-09 NOTE — Therapy (Signed)
[place], [place], [address] Phone: [phone]   Fax:  [phone]  Physical Therapy Evaluation  Patient Details  Name: Cassandra Sims MRN: [id number] Date of Birth: Dec 16, 1932 Referring Provider: Rhona Raider  Encounter Date: 05/09/2017      PT End of Session - 05/09/17 0833    Visit Number 1   Date for PT Re-Evaluation 07/09/17   PT Start Time 0804   PT Stop Time 0850   PT Time Calculation (min) 46 min   Activity Tolerance Patient tolerated treatment well   Behavior During Therapy [facility] for tasks assessed/performed      Past Medical History:  Diagnosis Date  . Arthritis   . Asthma   . Complication of anesthesia    hard to wake up  . GERD (gastroesophageal reflux disease)   . Glaucoma   . HOH (hard of hearing)   . Hypertension   . Multiple allergies   . Peripheral vascular disease (HCC)    varicose veins   . PONV (postoperative nausea and vomiting)   . Rhinitis   . Wears glasses     Past Surgical History:  Procedure Laterality Date  . ABDOMINAL HYSTERECTOMY    . COLECTOMY  2008   obstruction  . DIAGNOSTIC LAPAROSCOPY     exp  . DILATION AND CURETTAGE OF UTERUS    . FUNCTIONAL ENDOSCOPIC SINUS SURGERY  2009   bilat ethm,frontal,max  . NASAL SINUS SURGERY Bilateral 10/26/2012   Procedure: BILATERAL ENDOSCOPIC REVISION ETHMOID MAXILLARY AND FRONTAL SINUS SURGERY  ;  Surgeon: Izora Gala, MD;  Location: [facility];  Service: ENT;  Laterality: Bilateral;  . OVARIAN CYST SURGERY    . TUBAL LIGATION    . URETER SURGERY     cut accidentally during exp lap-ovarien cyst    There were no vitals filed for this visit.       Subjective Assessment - 05/09/17 0805    Subjective Patient reports that she has had some right shoulder pain for a few months, reports that she was doing some overhead reaching and gardening.  X-rays negative.  Had a cortisone injection this week  that has not helped much.   Limitations House hold activities;Lifting   Patient Stated Goals have less pain   Currently in Pain? Yes   Pain Score 8    Pain Location Shoulder   Pain Orientation Right   Pain Descriptors / Indicators Aching;Sore   Pain Type Acute pain   Pain Onset More than a month ago   Pain Frequency Constant   Aggravating Factors  reaching out and up, doing hair and getting dressed pain a 9/10   Pain Relieving Factors heat helps some at best pain a 6/10   Effect of Pain on Daily Activities difficulty with ADL's            [facility] PT Assessment - 05/09/17 0001      Assessment   Medical Diagnosis right shoulder pain   Referring Provider [place]   Onset Date/Surgical Date 04/09/17   Hand Dominance Right     Home Environment   Additional Comments does her own housework, light yardwork     Prior Function   Level of [place] Retired   Leisure likes to garden     Mining engineer Comments fwd head, rounded shoulders     ROM / Strength   AROM / PROM / Strength AROM;Strength  AROM   AROM Assessment Site Shoulder   Right/Left Shoulder Right   Right Shoulder Flexion 110 Degrees   Right Shoulder ABduction 110 Degrees   Right Shoulder Internal Rotation 70 Degrees   Right Shoulder External Rotation 80 Degrees     Strength   Overall Strength Comments 4-/5 without much increase of pain     Palpation   Palpation comment she is very tender anterior/lateral right shoulder, very tender and sore in the right upper trap, levator and rhomboid area     Special Tests    Special Tests --  + right shoulder impingement            Objective measurements completed on examination: See above findings.          [facility] Adult PT Treatment/Exercise - 05/09/17 0001      Modalities   Modalities Electrical Stimulation;Moist Heat     Moist Heat Therapy   Number Minutes Moist Heat 15 Minutes   Moist Heat Location  Cervical;Shoulder     Electrical Stimulation   Electrical Stimulation Location right upper trap/shoulder   Electrical Stimulation Action IFC   Electrical Stimulation Parameters sitting   Electrical Stimulation Goals Pain                PT Education - 05/09/17 [phone]    Education provided Yes   Education Details shrugs and retractions, upper trap and levator stretches   Person(s) Educated Patient   Methods Explanation;Demonstration;Handout   Comprehension Verbalized understanding          PT Short Term Goals - 05/09/17 0836      PT SHORT TERM GOAL #1   Title independent with initial HEP   Time 2   Period Weeks   Status New           PT Long Term Goals - 05/09/17 0165      PT LONG TERM GOAL #1   Title independent with advanced HEP   Time 8   Period Weeks   Status New     PT LONG TERM GOAL #2   Title decrease pain 50% with activity   Time 8   Period Weeks   Status New     PT LONG TERM GOAL #3   Title increase right shoulder AROM to 1400 degrees flexion   Time 8   Period Weeks   Status New     PT LONG TERM GOAL #4   Title understand proper posture and body mechanics   Time 8   Period Weeks   Status New                Plan - 05/09/17 0834    Clinical Impression Statement Patient reports right shoulder pain for about 2 months, reports that she feels like she just over did it, MD order is for impingement and trigger points, she has very limited Flexion and abduction, ER and IR is WFL's.  She has significant tenderness int he right upper trap, levator and rhomboid, she is tender in the lateral right shoulder, positive impingment test.  She is right handed   Clinical Presentation Stable   Clinical Decision Making Low   Rehab Potential Good   PT Frequency 2x / week   PT Duration 8 weeks   PT Treatment/Interventions ADLs/Self Care Home Management;Cryotherapy;Electrical Stimulation;Iontophoresis 4mg /ml Dexamethasone;Ultrasound;Moist  Heat;Therapeutic activities;Therapeutic exercise;Patient/family education;Manual techniques;Taping;Dry needling   PT Next Visit Plan try some exercises, could try tape in the future but reports bandaids make her skin  red   Consulted and Agree with Plan of Care Patient      Patient will benefit from skilled therapeutic intervention in order to improve the following deficits and impairments:  Decreased strength, Postural dysfunction, Improper body mechanics, Pain, Decreased range of motion  Visit Diagnosis: Acute pain of right shoulder - Plan: PT plan of care cert/re-cert  Stiffness of right shoulder, not elsewhere classified - Plan: PT plan of care cert/re-cert  Cramp and spasm - Plan: PT plan of care cert/re-cert      G-Codes - 16/10/96 0839    Functional Assessment Tool Used (Outpatient Only) foto 59% limitation   Functional Limitation Carrying, moving and handling objects   Carrying, Moving and Handling Objects Current Status (E4540) At least 40 percent but less than 60 percent impaired, limited or restricted   Carrying, Moving and Handling Objects Goal Status (J8119) At least 40 percent but less than 60 percent impaired, limited or restricted       Problem List Patient Active Problem List   Diagnosis Date Noted  . GERD (gastroesophageal reflux disease) 07/03/2016  . Hemidiaphragm paralysis 07/03/2016  . Nodule of right lung 06/19/2016  . Essential hypertension 03/29/2016  . Glaucoma 03/29/2016  . Arthritis 03/29/2016  . Sinusitis, chronic 11/25/2012  . Cough variant asthma 11/25/2012  . Cough 11/24/2012    Sumner Boast., PT 05/09/2017, 8:41 AM  [place], [place], [address] Phone: [phone]   Fax:  [phone]  Name: Cassandra Sims MRN: [id number] Date of Birth: February 08, 1933

## 2017-05-12 DIAGNOSIS — H04123 Dry eye syndrome of bilateral lacrimal glands: Secondary | ICD-10-CM | POA: Diagnosis not present

## 2017-05-12 DIAGNOSIS — H02881 Meibomian gland dysfunction right upper eyelid: Secondary | ICD-10-CM | POA: Diagnosis not present

## 2017-05-14 ENCOUNTER — Ambulatory Visit: Payer: Medicare Other | Admitting: Physical Therapy

## 2017-05-14 ENCOUNTER — Encounter: Payer: Self-pay | Admitting: Physical Therapy

## 2017-05-14 DIAGNOSIS — M25611 Stiffness of right shoulder, not elsewhere classified: Secondary | ICD-10-CM

## 2017-05-14 DIAGNOSIS — M25511 Pain in right shoulder: Secondary | ICD-10-CM

## 2017-05-14 DIAGNOSIS — R252 Cramp and spasm: Secondary | ICD-10-CM | POA: Diagnosis not present

## 2017-05-14 NOTE — Therapy (Signed)
[place], [place], [address] Phone: [phone]   Fax:  [phone]  Physical Therapy Treatment  Patient Details  Name: Cassandra Sims MRN: [id number] Date of Birth: 1933/02/28 Referring Provider: Rhona Raider  Encounter Date: 05/14/2017      PT End of Session - 05/14/17 0933    Visit Number 2   Date for PT Re-Evaluation 07/09/17   PT Start Time 0846   PT Stop Time 0943   PT Time Calculation (min) 57 min   Activity Tolerance Patient tolerated treatment well   Behavior During Therapy [facility] for tasks assessed/performed      Past Medical History:  Diagnosis Date  . Arthritis   . Asthma   . Complication of anesthesia    hard to wake up  . GERD (gastroesophageal reflux disease)   . Glaucoma   . HOH (hard of hearing)   . Hypertension   . Multiple allergies   . Peripheral vascular disease (HCC)    varicose veins   . PONV (postoperative nausea and vomiting)   . Rhinitis   . Wears glasses     Past Surgical History:  Procedure Laterality Date  . ABDOMINAL HYSTERECTOMY    . COLECTOMY  2008   obstruction  . DIAGNOSTIC LAPAROSCOPY     exp  . DILATION AND CURETTAGE OF UTERUS    . FUNCTIONAL ENDOSCOPIC SINUS SURGERY  2009   bilat ethm,frontal,max  . NASAL SINUS SURGERY Bilateral 10/26/2012   Procedure: BILATERAL ENDOSCOPIC REVISION ETHMOID MAXILLARY AND FRONTAL SINUS SURGERY  ;  Surgeon: Izora Gala, MD;  Location: [facility];  Service: ENT;  Laterality: Bilateral;  . OVARIAN CYST SURGERY    . TUBAL LIGATION    . URETER SURGERY     cut accidentally during exp lap-ovarien cyst    There were no vitals filed for this visit.      Subjective Assessment - 05/14/17 0849    Subjective Patient reports that she wsa feeling pretty good after the treatment last week.  Reports that over the past two days she is having more pain.   Currently in Pain? Yes   Pain Score 8    Pain  Location Shoulder   Pain Descriptors / Indicators Aching;Sore                         [facility] Adult PT Treatment/Exercise - 05/14/17 0001      Modalities   Modalities Electrical Stimulation;Ultrasound     Moist Heat Therapy   Number Minutes Moist Heat 15 Minutes   Moist Heat Location Cervical;Shoulder     Electrical Stimulation   Electrical Stimulation Location right upper trap/shoulder   Electrical Stimulation Action IFC   Electrical Stimulation Parameters sitting   Electrical Stimulation Goals Pain     Ultrasound   Ultrasound Location right shoulder   Ultrasound Parameters 100% 1.4w/cm2   Ultrasound Goals Pain     Manual Therapy   Manual Therapy Soft tissue mobilization;Passive ROM   Soft tissue mobilization to the right upper trap and the rhomboids   Passive ROM PROM of the right shoulder all motions                  PT Short Term Goals - 05/09/17 0836      PT SHORT TERM GOAL #1   Title independent with initial HEP   Time 2   Period Weeks   Status  New           PT Long Term Goals - 05/09/17 0836      PT LONG TERM GOAL #1   Title independent with advanced HEP   Time 8   Period Weeks   Status New     PT LONG TERM GOAL #2   Title decrease pain 50% with activity   Time 8   Period Weeks   Status New     PT LONG TERM GOAL #3   Title increase right shoulder AROM to 1400 degrees flexion   Time 8   Period Weeks   Status New     PT LONG TERM GOAL #4   Title understand proper posture and body mechanics   Time 8   Period Weeks   Status New               Plan - 05/14/17 0933    Clinical Impression Statement Patient passive ROM was very good today, close to [facility], she has a lot of spasms and tenernes sint he right upper trap and the rhomboid area   PT Next Visit Plan may need to address the spasms   Consulted and Agree with Plan of Care Patient      Patient will benefit from skilled therapeutic intervention in order  to improve the following deficits and impairments:  Decreased strength, Postural dysfunction, Improper body mechanics, Pain, Decreased range of motion  Visit Diagnosis: Acute pain of right shoulder  Stiffness of right shoulder, not elsewhere classified  Cramp and spasm     Problem List Patient Active Problem List   Diagnosis Date Noted  . GERD (gastroesophageal reflux disease) 07/03/2016  . Hemidiaphragm paralysis 07/03/2016  . Nodule of right lung 06/19/2016  . Essential hypertension 03/29/2016  . Glaucoma 03/29/2016  . Arthritis 03/29/2016  . Sinusitis, chronic 11/25/2012  . Cough variant asthma 11/25/2012  . Cough 11/24/2012    Sumner Boast., PT 05/14/2017, 9:35 AM  [place], [place], [address] Phone: [phone]   Fax:  [phone]  Name: Cassandra Sims MRN: [id number] Date of Birth: 1933/06/16

## 2017-05-16 ENCOUNTER — Encounter: Payer: Self-pay | Admitting: Physical Therapy

## 2017-05-16 ENCOUNTER — Ambulatory Visit: Payer: Medicare Other | Admitting: Physical Therapy

## 2017-05-16 DIAGNOSIS — M25611 Stiffness of right shoulder, not elsewhere classified: Secondary | ICD-10-CM

## 2017-05-16 DIAGNOSIS — R252 Cramp and spasm: Secondary | ICD-10-CM

## 2017-05-16 DIAGNOSIS — M25511 Pain in right shoulder: Secondary | ICD-10-CM | POA: Diagnosis not present

## 2017-05-16 NOTE — Therapy (Signed)
Cassandra Sims, [place], [address] Phone: [phone]   Fax:  [phone]  Physical Therapy Treatment  Patient Details  Name: Cassandra Sims MRN: [id number] Date of Birth: Apr 11, 1933 Referring Provider: Rhona Raider  Encounter Date: 05/16/2017      PT End of Session - 05/16/17 1132    Visit Number 3   Date for PT Re-Evaluation 07/09/17   PT Start Time 1051   PT Stop Time 1141   PT Time Calculation (min) 50 min   Activity Tolerance Patient tolerated treatment well   Behavior During Therapy [facility] for tasks assessed/performed      Past Medical History:  Diagnosis Date  . Arthritis   . Asthma   . Complication of anesthesia    hard to wake up  . GERD (gastroesophageal reflux disease)   . Glaucoma   . HOH (hard of hearing)   . Hypertension   . Multiple allergies   . Peripheral vascular disease (HCC)    varicose veins   . PONV (postoperative nausea and vomiting)   . Rhinitis   . Wears glasses     Past Surgical History:  Procedure Laterality Date  . ABDOMINAL HYSTERECTOMY    . COLECTOMY  2008   obstruction  . DIAGNOSTIC LAPAROSCOPY     exp  . DILATION AND CURETTAGE OF UTERUS    . FUNCTIONAL ENDOSCOPIC SINUS SURGERY  2009   bilat ethm,frontal,max  . NASAL SINUS SURGERY Bilateral 10/26/2012   Procedure: BILATERAL ENDOSCOPIC REVISION ETHMOID MAXILLARY AND FRONTAL SINUS SURGERY  ;  Surgeon: Izora Gala, MD;  Location: [facility];  Service: ENT;  Laterality: Bilateral;  . OVARIAN CYST SURGERY    . TUBAL LIGATION    . URETER SURGERY     cut accidentally during exp lap-ovarien cyst    There were no vitals filed for this visit.      Subjective Assessment - 05/16/17 1125    Subjective Reports that she ic sontinueing to do okay, tyring HEP, reports some increase of pain with planting flowers   Currently in Pain? Yes   Pain Score 7    Pain Location Shoulder   Pain Orientation  Right   Aggravating Factors  planting flowers   Pain Relieving Factors the treatments helps                         [facility] Adult PT Treatment/Exercise - 05/16/17 0001      Exercises   Exercises Shoulder     Shoulder Exercises: Seated   Elevation Both;10 reps   Elevation Weight (lbs) 2   Extension Both;20 reps;Theraband   Theraband Level (Shoulder Extension) Level 2 (Red)   Retraction Both;20 reps;Theraband   Theraband Level (Shoulder Retraction) Level 2 (Red)   External Rotation Both;20 reps;Theraband   Theraband Level (Shoulder External Rotation) Level 2 (Red)   Internal Rotation Right;15 reps;Theraband   Theraband Level (Shoulder Internal Rotation) Level 2 (Red)     Moist Heat Therapy   Number Minutes Moist Heat 15 Minutes   Moist Heat Location Cervical;Shoulder     Electrical Stimulation   Electrical Stimulation Location right upper trap/shoulder   Electrical Stimulation Action IFC   Electrical Stimulation Parameters sitting   Electrical Stimulation Goals Pain     Ultrasound   Ultrasound Location right shoulder   Ultrasound Parameters 100% 1.4w/cm2   Ultrasound Goals Pain     Manual Therapy  Manual Therapy Soft tissue mobilization;Passive ROM   Soft tissue mobilization to the right upper trap and the rhomboids   Passive ROM PROM of the right shoulder all motions                  PT Short Term Goals - 05/16/17 1133      PT SHORT TERM GOAL #1   Title independent with initial HEP   Status Achieved           PT Long Term Goals - 05/09/17 0836      PT LONG TERM GOAL #1   Title independent with advanced HEP   Time 8   Period Weeks   Status New     PT LONG TERM GOAL #2   Title decrease pain 50% with activity   Time 8   Period Weeks   Status New     PT LONG TERM GOAL #3   Title increase right shoulder AROM to 1400 degrees flexion   Time 8   Period Weeks   Status New     PT LONG TERM GOAL #4   Title understand proper  posture and body mechanics   Time 8   Period Weeks   Status New               Plan - 05/16/17 1133    Clinical Impression Statement Patient with trigger points in the right upper trap and rhomboids that refer pain to the right shoulder.     PT Next Visit Plan continue to work on trigger points   Consulted and Agree with Plan of Care Patient      Patient will benefit from skilled therapeutic intervention in order to improve the following deficits and impairments:  Decreased strength, Postural dysfunction, Improper body mechanics, Pain, Decreased range of motion  Visit Diagnosis: Acute pain of right shoulder  Stiffness of right shoulder, not elsewhere classified  Cramp and spasm     Problem List Patient Active Problem List   Diagnosis Date Noted  . GERD (gastroesophageal reflux disease) 07/03/2016  . Hemidiaphragm paralysis 07/03/2016  . Nodule of right lung 06/19/2016  . Essential hypertension 03/29/2016  . Glaucoma 03/29/2016  . Arthritis 03/29/2016  . Sinusitis, chronic 11/25/2012  . Cough variant asthma 11/25/2012  . Cough 11/24/2012    Sumner Boast., PT 05/16/2017, 11:34 AM  [place], [place], [address] Phone: [phone]   Fax:  [phone]  Name: Cassandra Sims MRN: [id number] Date of Birth: [birth year]

## 2017-05-20 ENCOUNTER — Encounter: Payer: Self-pay | Admitting: Physical Therapy

## 2017-05-20 ENCOUNTER — Ambulatory Visit: Payer: Medicare Other | Admitting: Physical Therapy

## 2017-05-20 DIAGNOSIS — M25611 Stiffness of right shoulder, not elsewhere classified: Secondary | ICD-10-CM

## 2017-05-20 DIAGNOSIS — R252 Cramp and spasm: Secondary | ICD-10-CM

## 2017-05-20 DIAGNOSIS — M25511 Pain in right shoulder: Secondary | ICD-10-CM

## 2017-05-20 NOTE — Therapy (Signed)
[place], [place], [address] Phone: [phone]   Fax:  [phone]  Physical Therapy Treatment  Patient Details  Name: Cassandra Sims MRN: [id number] Date of Birth: [birth year] Referring Provider: Rhona Raider  Encounter Date: 05/20/2017      PT End of Session - 05/20/17 1128    Visit Number 4   Date for PT Re-Evaluation 07/09/17   PT Start Time 1055   PT Stop Time 1143   PT Time Calculation (min) 48 min   Activity Tolerance Patient tolerated treatment well   Behavior During Therapy [facility] for tasks assessed/performed      Past Medical History:  Diagnosis Date  . Arthritis   . Asthma   . Complication of anesthesia    hard to wake up  . GERD (gastroesophageal reflux disease)   . Glaucoma   . HOH (hard of hearing)   . Hypertension   . Multiple allergies   . Peripheral vascular disease (HCC)    varicose veins   . PONV (postoperative nausea and vomiting)   . Rhinitis   . Wears glasses     Past Surgical History:  Procedure Laterality Date  . ABDOMINAL HYSTERECTOMY    . COLECTOMY  2008   obstruction  . DIAGNOSTIC LAPAROSCOPY     exp  . DILATION AND CURETTAGE OF UTERUS    . FUNCTIONAL ENDOSCOPIC SINUS SURGERY  2009   bilat ethm,frontal,max  . NASAL SINUS SURGERY Bilateral 10/26/2012   Procedure: BILATERAL ENDOSCOPIC REVISION ETHMOID MAXILLARY AND FRONTAL SINUS SURGERY  ;  Surgeon: Izora Gala, MD;  Location: [facility];  Service: ENT;  Laterality: Bilateral;  . OVARIAN CYST SURGERY    . TUBAL LIGATION    . URETER SURGERY     cut accidentally during exp lap-ovarien cyst    There were no vitals filed for this visit.          [facility] PT Assessment - 05/20/17 0001      AROM   Right Shoulder Flexion 122 Degrees   Right Shoulder ABduction 120 Degrees                     [facility] Adult PT Treatment/Exercise - 05/20/17 0001      Shoulder Exercises: Seated    Other Seated Exercises shrugs 2#, extension 2#     Shoulder Exercises: Standing   External Rotation 20 reps;Theraband   Theraband Level (Shoulder External Rotation) Level 2 (Red)   Row 20 reps;Theraband   Theraband Level (Shoulder Row) Level 2 (Red)   Retraction 20 reps;Theraband   Theraband Level (Shoulder Retraction) Level 2 (Red)     Shoulder Exercises: ROM/Strengthening   UBE (Upper Arm Bike) L2 x 4 minutes   "W" Arms 20 reps   Other ROM/Strengthening Exercises wall slides     Moist Heat Therapy   Number Minutes Moist Heat 15 Minutes   Moist Heat Location Cervical;Shoulder     Electrical Stimulation   Electrical Stimulation Location right upper trap/shoulder   Electrical Stimulation Action IFC   Electrical Stimulation Parameters sitting pa     Manual Therapy   Manual Therapy Soft tissue mobilization;Passive ROM   Soft tissue mobilization to the right upper trap and the rhomboids   Passive ROM PROM of the right shoulder all motions                  PT Short Term Goals - 05/16/17 1133  PT SHORT TERM GOAL #1   Title independent with initial HEP   Status Achieved           PT Long Term Goals - 05/20/17 1129      PT LONG TERM GOAL #2   Title decrease pain 50% with activity   Status On-going     PT LONG TERM GOAL #3   Title increase right shoulder AROM to 140 degrees flexion   Status Partially Met     PT LONG TERM GOAL #4   Title understand proper posture and body mechanics   Status On-going               Plan - 05/20/17 1129    Clinical Impression Statement The ROM continues to improve and she moves easier and better however she is having a lot of spasms and trigger points in the right upper trap and the rhomboids   PT Next Visit Plan continue to work on trigger points   Consulted and Agree with Plan of Care Patient      Patient will benefit from skilled therapeutic intervention in order to improve the following deficits and  impairments:  Decreased strength, Postural dysfunction, Improper body mechanics, Pain, Decreased range of motion  Visit Diagnosis: Acute pain of right shoulder  Stiffness of right shoulder, not elsewhere classified  Cramp and spasm     Problem List Patient Active Problem List   Diagnosis Date Noted  . GERD (gastroesophageal reflux disease) 07/03/2016  . Hemidiaphragm paralysis 07/03/2016  . Nodule of right lung 06/19/2016  . Essential hypertension 03/29/2016  . Glaucoma 03/29/2016  . Arthritis 03/29/2016  . Sinusitis, chronic 11/25/2012  . Cough variant asthma 11/25/2012  . Cough 11/24/2012    Sumner Boast., PT 05/20/2017, 11:30 AM  [place], [place], [address] Phone: [phone]   Fax:  [phone]  Name: Cassandra Sims MRN: [id number] Date of Birth: [birth year]

## 2017-05-23 ENCOUNTER — Encounter: Payer: Medicare Other | Admitting: Physical Therapy

## 2017-05-26 DIAGNOSIS — L820 Inflamed seborrheic keratosis: Secondary | ICD-10-CM | POA: Diagnosis not present

## 2017-05-26 DIAGNOSIS — D485 Neoplasm of uncertain behavior of skin: Secondary | ICD-10-CM | POA: Diagnosis not present

## 2017-05-26 DIAGNOSIS — Z08 Encounter for follow-up examination after completed treatment for malignant neoplasm: Secondary | ICD-10-CM | POA: Diagnosis not present

## 2017-05-26 DIAGNOSIS — H401113 Primary open-angle glaucoma, right eye, severe stage: Secondary | ICD-10-CM | POA: Diagnosis not present

## 2017-05-26 DIAGNOSIS — Z85828 Personal history of other malignant neoplasm of skin: Secondary | ICD-10-CM | POA: Diagnosis not present

## 2017-05-26 DIAGNOSIS — H401122 Primary open-angle glaucoma, left eye, moderate stage: Secondary | ICD-10-CM | POA: Diagnosis not present

## 2017-05-26 DIAGNOSIS — H35373 Puckering of macula, bilateral: Secondary | ICD-10-CM | POA: Diagnosis not present

## 2017-05-27 ENCOUNTER — Ambulatory Visit (INDEPENDENT_AMBULATORY_CARE_PROVIDER_SITE_OTHER): Payer: Medicare Other | Admitting: Allergy and Immunology

## 2017-05-27 ENCOUNTER — Encounter: Payer: Self-pay | Admitting: Allergy and Immunology

## 2017-05-27 VITALS — BP 110/64 | HR 76 | Resp 18

## 2017-05-27 DIAGNOSIS — D721 Eosinophilia, unspecified: Secondary | ICD-10-CM

## 2017-05-27 DIAGNOSIS — L989 Disorder of the skin and subcutaneous tissue, unspecified: Secondary | ICD-10-CM

## 2017-05-27 DIAGNOSIS — H35353 Cystoid macular degeneration, bilateral: Secondary | ICD-10-CM | POA: Diagnosis not present

## 2017-05-27 DIAGNOSIS — L308 Other specified dermatitis: Secondary | ICD-10-CM | POA: Diagnosis not present

## 2017-05-27 DIAGNOSIS — J4540 Moderate persistent asthma, uncomplicated: Secondary | ICD-10-CM

## 2017-05-27 DIAGNOSIS — M3500 Sicca syndrome, unspecified: Secondary | ICD-10-CM

## 2017-05-27 NOTE — Progress Notes (Signed)
Follow-up Note  Referring Provider: Lilian Coma., MD Primary Provider: Lilian Coma., MD Date of Office Visit: 05/27/2017  Subjective:   Cassandra Sims (DOB: 12-24-1932) is a 83 y.o. female who returns to the [facility] on 05/27/2017 in re-evaluation of the following:  HPI: Teffany presents to this clinic in evaluation of her inflammatory dermatosis and history of dry eye syndrome and history of asthma and allergic rhinitis.  I last saw her in this clinic 05 March 2017 at which point in time she had a very good response to the inflammatory dermatosis affecting her abdomen with anti-inflammatory therapy.  She has continued to have very good control of most of her skin issue although there is an area on her right shoulder that is persistently itchy.  She has a very difficult time putting any type of topical agent in this area and she uses a back scratcher for relief.  Her primary care doctor placed her on hydroxyzine to be used for both a sleep aid and for itchiness.  She uses this predominantly at nighttime.  It should be noted that her dry eye syndrome has been quite significant since she has started hydroxyzine.  She is presently using doxycycline as she has been for the past month in the treatment of this condition but this has not helped her at all.  Her respiratory tract disease is under good control and she has not required a systemic steroid or antibiotic to treat this issue and she rarely uses a short acting bronchodilator while continuing to use Flonase and montelukast and Dulera on a consistent basis.  Allergies as of 05/27/2017      Reactions   Penicillins Shortness Of Breath   Adhesive [tape] Itching   Clindamycin/lincomycin Itching   Codeine Nausea And Vomiting   Darvocet [propoxyphene N-acetaminophen]    HTN   Dilaudid [hydromorphone Hcl] Nausea And Vomiting   Doxycycline    Pt unsure of reaction.    Flagyl [metronidazole]    Lactose Intolerance  (gi)    Levofloxacin    Joint pain   Macrodantin [nitrofurantoin Macrocrystal]    Morphine And Related    hallucinate   Premarin [conjugated Estrogens] Itching   Zantac [ranitidine Hcl]    Itching    Zofran [ondansetron Hcl]    HTN   Nitrofuran Derivatives Rash   Sulfa Antibiotics Rash      Medication List      acetaminophen 325 MG tablet Commonly known as:  TYLENOL Per bottle as needed for pain   albuterol 108 (90 Base) MCG/ACT inhaler Commonly known as:  PROVENTIL HFA;VENTOLIN HFA Inhale 2 puffs into the lungs every 4 (four) hours as needed for wheezing. Reported on 10/10/2015   amLODipine 5 MG tablet Commonly known as:  NORVASC   calcium-vitamin D 500-200 MG-UNIT tablet Commonly known as:  OSCAL WITH D Take 1 tablet by mouth 2 (two) times daily.   CENTRUM SILVER PO Take 1 tablet by mouth daily.   cholecalciferol 1000 units tablet Commonly known as:  VITAMIN D Take 1,000 Units by mouth daily.   doxycycline 50 MG capsule Commonly known as:  MONODOX Take 50 mg daily by mouth.   famotidine 20 MG tablet Commonly known as:  PEPCID Take 1 tablet (20 mg total) by mouth 2 (two) times daily.   fluticasone 50 MCG/ACT nasal spray Commonly known as:  FLONASE Place 1 spray into both nostrils 2 (two) times daily.   hydrOXYzine 10 MG tablet  Commonly known as:  ATARAX/VISTARIL Take 10 mg 3 (three) times daily as needed by mouth.   LUTEIN 20 Caps Take 1 capsule by mouth every morning.   meloxicam 7.5 MG tablet Commonly known as:  MOBIC   mometasone 0.1 % ointment Commonly known as:  ELOCON Apply topically 2 (two) times daily.   mometasone-formoterol 100-5 MCG/ACT Aero Commonly known as:  DULERA Take 2 puffs first thing in am and then another 2 puffs about 12 hours later.   montelukast 10 MG tablet Commonly known as:  SINGULAIR Take 10 mg by mouth at bedtime.   pimecrolimus 1 % cream Commonly known as:  ELIDEL Apply topically 2 (two) times daily.     psyllium 58.6 % powder Commonly known as:  METAMUCIL 1 tsp every morning   SYSTANE OP Apply to eye.   tacrolimus 0.1 % ointment Commonly known as:  PROTOPIC Apply topically 2 (two) times daily.   telmisartan 40 MG tablet Commonly known as:  MICARDIS Take 40 mg by mouth daily.   ZIOPTAN 0.0015 % Soln Generic drug:  Tafluprost INT 1 GTT IN OU Q NIGHT       Past Medical History:  Diagnosis Date  . Arthritis   . Asthma   . Complication of anesthesia    hard to wake up  . GERD (gastroesophageal reflux disease)   . Glaucoma   . HOH (hard of hearing)   . Hypertension   . Multiple allergies   . Peripheral vascular disease (HCC)    varicose veins   . PONV (postoperative nausea and vomiting)   . Rhinitis   . Wears glasses     Past Surgical History:  Procedure Laterality Date  . ABDOMINAL HYSTERECTOMY    . COLECTOMY  2008   obstruction  . DIAGNOSTIC LAPAROSCOPY     exp  . DILATION AND CURETTAGE OF UTERUS    . FUNCTIONAL ENDOSCOPIC SINUS SURGERY  2009   bilat ethm,frontal,max  . OVARIAN CYST SURGERY    . TUBAL LIGATION    . URETER SURGERY     cut accidentally during exp lap-ovarien cyst    Review of systems negative except as noted in HPI / PMHx or noted below:  Review of Systems  Constitutional: Negative.   HENT: Negative.   Eyes: Negative.   Respiratory: Negative.   Cardiovascular: Negative.   Gastrointestinal: Negative.   Genitourinary: Negative.   Musculoskeletal: Negative.   Skin: Negative.   Neurological: Negative.   Endo/Heme/Allergies: Negative.   Psychiatric/Behavioral: Negative.      Objective:   Vitals:   05/27/17 1000  BP: 110/64  Pulse: 76  Resp: 18          Physical Exam  Constitutional: She is well-developed, well-nourished, and in no distress.  HENT:  Head: Normocephalic.  Right Ear: Tympanic membrane, external ear and ear canal normal.  Left Ear: Tympanic membrane, external ear and ear canal normal.  Nose: Nose normal.  No mucosal edema or rhinorrhea.  Mouth/Throat: Uvula is midline and oropharynx is clear and moist. Mucous membranes are dry. No oropharyngeal exudate.  Eyes: Right conjunctiva is injected. Left conjunctiva is injected.  Neck: Trachea normal. No tracheal tenderness present. No tracheal deviation present. No thyromegaly present.  Cardiovascular: Normal rate, regular rhythm, S1 normal, S2 normal and normal heart sounds.  No murmur heard. Pulmonary/Chest: Breath sounds normal. No stridor. No respiratory distress. She has no wheezes. She has no rales.  Musculoskeletal: She exhibits no edema.  Lymphadenopathy:  Head (right side): No tonsillar adenopathy present.       Head (left side): No tonsillar adenopathy present.    She has no cervical adenopathy.  Neurological: She is alert. Gait normal.  Skin: No rash noted. She is not diaphoretic. No erythema. Nails show no clubbing.  Psychiatric: Mood and affect normal.    Diagnostics:    Spirometry was performed and demonstrated an FEV1 of 1.37 at 82 % of predicted.  The patient had an Asthma Control Test with the following results: ACT Total Score: 22.    Assessment and Plan:   1. Inflammatory dermatosis   2. Asthma, moderate persistent, well-controlled   3. Eosinophilia   4. Sicca syndrome ([place])     1. Do not scratch or itch skin  2. Use a combination of Elidel followed by mometasone 0.1% ointment to dematitis twice a day until resolved  3. Continue Flonase and montelukast and Dulera and albuterol  4. Discontinue hydroxyzine   5. Discuss with primary doctor about sleep aid that is not a antihistamine   6. Revisit with ophthalmologist about use of doxycycline  7. Return to clinic in 12 weeks or earlier if problem  8.  Obtain blood test - ANA w/reflex  I do not think that Tasheena will be able to stay on hydroxyzine regarding control of her insomnia as this has really resulted in a significant flare of her dry eye syndrome and  may also be contributing to dry mouth.  On that subject I think we need to perform a screening ANA to make sure that we are not dealing with Sjogren's syndrome as she does have dry eye and mouth.  She has the option of utilizing topical anti-inflammatory agents to her dermatitis should it flareup in the future.  She will continue to utilize anti-inflammatory medications for her eosinophilic driven respiratory tract disease as noted above.  I have asked her to revisit with her ophthalmologist about the question of continued use of doxycycline as this really did not appear to help her very much however, it should be noted that her doxycycline use was prescribed in the context of using her hydroxyzine which did appear to make her eye issue worse.  I will contact her with the results of her blood test once it is available for review.  Allena Katz, MD Allergy / Immunology [place]

## 2017-05-27 NOTE — Patient Instructions (Addendum)
  1. Do not scratch or itch skin  2. Use a combination of Elidel followed by mometasone 0.1% ointment to dematitis twice a day until resolved  3. Continue Flonase and montelukast and Dulera and albuterol  4. Discontinue hydroxyzine   5. Discuss with primary doctor about sleep aid that is not a antihistamine   6. Revisit with ophthalmologist about use of doxycycline  7. Return to clinic in 12 weeks or earlier if problem  8.  Obtain blood test - ANA w/reflex

## 2017-05-28 ENCOUNTER — Encounter: Payer: Self-pay | Admitting: Allergy and Immunology

## 2017-05-28 ENCOUNTER — Encounter: Payer: Self-pay | Admitting: Physical Therapy

## 2017-05-28 ENCOUNTER — Ambulatory Visit: Payer: Medicare Other | Attending: Orthopaedic Surgery | Admitting: Physical Therapy

## 2017-05-28 DIAGNOSIS — M25511 Pain in right shoulder: Secondary | ICD-10-CM | POA: Insufficient documentation

## 2017-05-28 DIAGNOSIS — R252 Cramp and spasm: Secondary | ICD-10-CM | POA: Diagnosis not present

## 2017-05-28 DIAGNOSIS — M25611 Stiffness of right shoulder, not elsewhere classified: Secondary | ICD-10-CM | POA: Insufficient documentation

## 2017-05-28 LAB — ANA W/REFLEX IF POSITIVE: ANA: NEGATIVE

## 2017-05-28 NOTE — Therapy (Signed)
[place], [place], [address] Phone: [phone]   Fax:  [phone]  Physical Therapy Treatment  Patient Details  Name: Cassandra Sims MRN: [id number] Date of Birth: 18-Jan-1933 Referring Provider: Rhona Raider   Encounter Date: 05/28/2017  PT End of Session - 05/28/17 1043    Visit Number  5    Date for PT Re-Evaluation  07/09/17    PT Start Time  5027    PT Stop Time  1105    PT Time Calculation (min)  50 min    Activity Tolerance  Patient tolerated treatment well    Behavior During Therapy  [facility] for tasks assessed/performed       Past Medical History:  Diagnosis Date  . Arthritis   . Asthma   . Complication of anesthesia    hard to wake up  . GERD (gastroesophageal reflux disease)   . Glaucoma   . HOH (hard of hearing)   . Hypertension   . Multiple allergies   . Peripheral vascular disease (HCC)    varicose veins   . PONV (postoperative nausea and vomiting)   . Rhinitis   . Wears glasses     Past Surgical History:  Procedure Laterality Date  . ABDOMINAL HYSTERECTOMY    . COLECTOMY  2008   obstruction  . DIAGNOSTIC LAPAROSCOPY     exp  . DILATION AND CURETTAGE OF UTERUS    . FUNCTIONAL ENDOSCOPIC SINUS SURGERY  2009   bilat ethm,frontal,max  . OVARIAN CYST SURGERY    . TUBAL LIGATION    . URETER SURGERY     cut accidentally during exp lap-ovarien cyst    There were no vitals filed for this visit.  Subjective Assessment - 05/28/17 1017    Subjective  I think I am moving better.  I am having less pain    Currently in Pain?  Yes    Pain Score  2     Pain Location  Shoulder    Pain Orientation  Right    Pain Descriptors / Indicators  Tightness         [facility] PT Assessment - 05/28/17 0001      AROM   Right Shoulder Flexion  127 Degrees                  [facility] Adult PT Treatment/Exercise - 05/28/17 0001      Shoulder Exercises: Seated   Elevation   Both;10 reps    Elevation Weight (lbs)  2    Extension  Both;20 reps;Theraband    Theraband Level (Shoulder Extension)  Level 2 (Red)    Retraction  Both;20 reps;Theraband    Theraband Level (Shoulder Retraction)  Level 2 (Red)    External Rotation  Both;20 reps;Theraband    Theraband Level (Shoulder External Rotation)  Level 2 (Red)    Internal Rotation  Right;15 reps;Theraband    Other Seated Exercises  shrugs 2#, extension 2#      Shoulder Exercises: Standing   Row  20 reps;Theraband    Theraband Level (Shoulder Row)  Level 2 (Red)    Retraction  20 reps;Theraband    Theraband Level (Shoulder Retraction)  Level 2 (Red)      Shoulder Exercises: ROM/Strengthening   UBE (Upper Arm Bike)  L2 x 4 minutes    "W" Arms  20 reps      Moist Heat Therapy   Number Minutes Moist Heat  15 Minutes  Moist Heat Location  Cervical;Shoulder      Acupuncturist Location  right upper trap/shoulder    Chartered certified accountant  IFC    Electrical Stimulation Parameters  sitting    Electrical Stimulation Goals  Pain      Manual Therapy   Manual Therapy  Soft tissue mobilization;Passive ROM    Soft tissue mobilization  to the right upper trap and the rhomboids    Passive ROM  PROM of the right shoulder all motions               PT Short Term Goals - 05/16/17 1133      PT SHORT TERM GOAL #1   Title  independent with initial HEP    Status  Achieved        PT Long Term Goals - 05/28/17 1047      PT LONG TERM GOAL #1   Title  independent with advanced HEP    Status  Partially Met      PT LONG TERM GOAL #2   Title  decrease pain 50% with activity    Status  Partially Met      PT LONG TERM GOAL #3   Title  increase right shoulder AROM to 140 degrees flexion    Status  Partially Met            Plan - 05/28/17 1046    Clinical Impression Statement  Patient continues to feel that she is doing better with her shoulder, her biggest  c/o today was right knee pain.  Reports doing more around the house with less shoulder issues    PT Next Visit Plan  continue to work on trigger points    Consulted and Agree with Plan of Care  Patient       Patient will benefit from skilled therapeutic intervention in order to improve the following deficits and impairments:  Decreased strength, Postural dysfunction, Improper body mechanics, Pain, Decreased range of motion  Visit Diagnosis: Acute pain of right shoulder  Stiffness of right shoulder, not elsewhere classified  Cramp and spasm     Problem List Patient Active Problem List   Diagnosis Date Noted  . GERD (gastroesophageal reflux disease) 07/03/2016  . Hemidiaphragm paralysis 07/03/2016  . Nodule of right lung 06/19/2016  . Essential hypertension 03/29/2016  . Glaucoma 03/29/2016  . Arthritis 03/29/2016  . Sinusitis, chronic 11/25/2012  . Cough variant asthma 11/25/2012  . Cough 11/24/2012    Cassandra Boast., PT 05/28/2017, 10:48 AM  [place], [place], [address] Phone: [phone]   Fax:  [phone]  Name: Cassandra Sims MRN: [id number] Date of Birth: [birth year]

## 2017-05-29 DIAGNOSIS — L600 Ingrowing nail: Secondary | ICD-10-CM | POA: Diagnosis not present

## 2017-05-29 DIAGNOSIS — M79672 Pain in left foot: Secondary | ICD-10-CM | POA: Diagnosis not present

## 2017-05-29 DIAGNOSIS — M79671 Pain in right foot: Secondary | ICD-10-CM | POA: Diagnosis not present

## 2017-06-16 ENCOUNTER — Encounter: Payer: Medicare Other | Admitting: Physical Therapy

## 2017-06-16 DIAGNOSIS — Z9889 Other specified postprocedural states: Secondary | ICD-10-CM | POA: Diagnosis not present

## 2017-06-16 DIAGNOSIS — H04123 Dry eye syndrome of bilateral lacrimal glands: Secondary | ICD-10-CM | POA: Diagnosis not present

## 2017-06-16 DIAGNOSIS — H02883 Meibomian gland dysfunction of right eye, unspecified eyelid: Secondary | ICD-10-CM | POA: Diagnosis not present

## 2017-06-17 ENCOUNTER — Ambulatory Visit: Payer: Medicare Other | Admitting: Physical Therapy

## 2017-06-18 DIAGNOSIS — H401122 Primary open-angle glaucoma, left eye, moderate stage: Secondary | ICD-10-CM | POA: Diagnosis not present

## 2017-06-18 DIAGNOSIS — H401113 Primary open-angle glaucoma, right eye, severe stage: Secondary | ICD-10-CM | POA: Diagnosis not present

## 2017-06-19 ENCOUNTER — Ambulatory Visit (INDEPENDENT_AMBULATORY_CARE_PROVIDER_SITE_OTHER): Payer: Medicare Other | Admitting: Pulmonary Disease

## 2017-06-19 ENCOUNTER — Encounter: Payer: Self-pay | Admitting: Pulmonary Disease

## 2017-06-19 VITALS — BP 126/76 | HR 66 | Ht 62.5 in | Wt 121.4 lb

## 2017-06-19 DIAGNOSIS — J328 Other chronic sinusitis: Secondary | ICD-10-CM

## 2017-06-19 DIAGNOSIS — K219 Gastro-esophageal reflux disease without esophagitis: Secondary | ICD-10-CM | POA: Diagnosis not present

## 2017-06-19 DIAGNOSIS — J45991 Cough variant asthma: Secondary | ICD-10-CM | POA: Diagnosis not present

## 2017-06-19 NOTE — Patient Instructions (Addendum)
   Keep taking your medications as prescribed.   We will see you back in 3 months or sooner if needed.  Call if you have any new breathing problems or questions before your next appointment.
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
## 2017-06-24 ENCOUNTER — Ambulatory Visit: Payer: Medicare Other | Attending: Orthopaedic Surgery | Admitting: Physical Therapy

## 2017-06-24 ENCOUNTER — Encounter: Payer: Self-pay | Admitting: Physical Therapy

## 2017-06-24 DIAGNOSIS — R252 Cramp and spasm: Secondary | ICD-10-CM

## 2017-06-24 DIAGNOSIS — M25611 Stiffness of right shoulder, not elsewhere classified: Secondary | ICD-10-CM | POA: Insufficient documentation

## 2017-06-24 DIAGNOSIS — M25511 Pain in right shoulder: Secondary | ICD-10-CM | POA: Diagnosis not present

## 2017-06-24 NOTE — Therapy (Signed)
[place], [place], [address] Phone: [phone]   Fax:  [phone]  Physical Therapy Treatment  Patient Details  Name: Cassandra Sims MRN: [id number] Date of Birth: Jun 28, 1933 Referring Provider: Rhona Raider   Encounter Date: 06/24/2017  PT End of Session - 06/24/17 1611    Visit Number  6    Date for PT Re-Evaluation  07/09/17    PT Start Time  1530    PT Stop Time  1617    PT Time Calculation (min)  47 min    Activity Tolerance  Patient tolerated treatment well    Behavior During Therapy  [facility] for tasks assessed/performed       Past Medical History:  Diagnosis Date  . Arthritis   . Asthma   . Complication of anesthesia    hard to wake up  . GERD (gastroesophageal reflux disease)   . Glaucoma   . HOH (hard of hearing)   . Hypertension   . Multiple allergies   . Peripheral vascular disease (HCC)    varicose veins   . PONV (postoperative nausea and vomiting)   . Rhinitis   . Wears glasses     Past Surgical History:  Procedure Laterality Date  . ABDOMINAL HYSTERECTOMY    . COLECTOMY  2008   obstruction  . DIAGNOSTIC LAPAROSCOPY     exp  . DILATION AND CURETTAGE OF UTERUS    . FUNCTIONAL ENDOSCOPIC SINUS SURGERY  2009   bilat ethm,frontal,max  . NASAL SINUS SURGERY Bilateral 10/26/2012   Procedure: BILATERAL ENDOSCOPIC REVISION ETHMOID MAXILLARY AND FRONTAL SINUS SURGERY  ;  Surgeon: Izora Gala, MD;  Location: [facility];  Service: ENT;  Laterality: Bilateral;  . OVARIAN CYST SURGERY    . TUBAL LIGATION    . URETER SURGERY     cut accidentally during exp lap-ovarien cyst    There were no vitals filed for this visit.  Subjective Assessment - 06/24/17 1609    Subjective  Patient reports that she had a very buys few weeks with Thanksgiving and a son and grandchild having surgery(kidney transplant)    Currently in Pain?  Yes    Pain Score  5     Pain Location   Shoulder    Pain Orientation  Right;Left    Pain Descriptors / Indicators  Tightness;Spasm    Aggravating Factors   stress                      [facility] Adult PT Treatment/Exercise - 06/24/17 0001      Moist Heat Therapy   Number Minutes Moist Heat  15 Minutes    Moist Heat Location  Cervical;Shoulder      Electrical Stimulation   Electrical Stimulation Location  right upper trap/shoulder    Electrical Stimulation Action  IFC    Electrical Stimulation Parameters  sitting    Electrical Stimulation Goals  Pain      Manual Therapy   Manual Therapy  Soft tissue mobilization;Passive ROM    Soft tissue mobilization  to the right upper trap and the rhomboids    Passive ROM  PROM of the right shoulder all motions               PT Short Term Goals - 05/16/17 1133      PT SHORT TERM GOAL #1   Title  independent with initial HEP    Status  Achieved  PT Long Term Goals - 06/24/17 1614      PT LONG TERM GOAL #1   Title  independent with advanced HEP    Status  Partially Met      PT LONG TERM GOAL #2   Title  decrease pain 50% with activity    Status  Achieved            Plan - 06/24/17 1611    Clinical Impression Statement  Patient has been out for about 3 weeks due to family surgeries and Thanksgiving, she returns with a lot more spasms and tenderness in the upper traps and the rhomboidss and cervical area    PT Next Visit Plan  continue to work on trigger points    Consulted and Agree with Plan of Care  Patient       Patient will benefit from skilled therapeutic intervention in order to improve the following deficits and impairments:  Decreased strength, Postural dysfunction, Improper body mechanics, Pain, Decreased range of motion  Visit Diagnosis: Acute pain of right shoulder  Stiffness of right shoulder, not elsewhere classified  Cramp and spasm     Problem List Patient Active Problem List   Diagnosis Date Noted  . GERD  (gastroesophageal reflux disease) 07/03/2016  . Hemidiaphragm paralysis 07/03/2016  . Nodule of right lung 06/19/2016  . Essential hypertension 03/29/2016  . Glaucoma 03/29/2016  . Arthritis 03/29/2016  . Sinusitis, chronic 11/25/2012  . Cough variant asthma 11/25/2012  . Cough 11/24/2012    Sumner Boast., PT 06/24/2017, 4:16 PM  [place], [place], [address] Phone: [phone]   Fax:  [phone]  Name: Cassandra Sims MRN: [id number] Date of Birth: [birth year]

## 2017-07-01 ENCOUNTER — Ambulatory Visit: Payer: Medicare Other | Admitting: Physical Therapy

## 2017-07-08 ENCOUNTER — Ambulatory Visit: Payer: Medicare Other | Admitting: Physical Therapy

## 2017-07-08 ENCOUNTER — Encounter: Payer: Self-pay | Admitting: Physical Therapy

## 2017-07-08 DIAGNOSIS — M25511 Pain in right shoulder: Secondary | ICD-10-CM | POA: Diagnosis not present

## 2017-07-08 DIAGNOSIS — R252 Cramp and spasm: Secondary | ICD-10-CM | POA: Diagnosis not present

## 2017-07-08 DIAGNOSIS — M25611 Stiffness of right shoulder, not elsewhere classified: Secondary | ICD-10-CM

## 2017-07-08 NOTE — Therapy (Signed)
[place], [place], [address] Phone: [phone]   Fax:  [phone]  Physical Therapy Treatment  Patient Details  Name: Cassandra Sims MRN: [id number] Date of Birth: July 11, 1933 Referring Provider: Rhona Raider   Encounter Date: 07/08/2017  PT End of Session - 07/08/17 0946    Visit Number  7    Date for PT Re-Evaluation  08/09/17    PT Start Time  0925    PT Stop Time  1016    PT Time Calculation (min)  51 min    Activity Tolerance  Patient tolerated treatment well    Behavior During Therapy  [facility] for tasks assessed/performed       Past Medical History:  Diagnosis Date  . Arthritis   . Asthma   . Complication of anesthesia    hard to wake up  . GERD (gastroesophageal reflux disease)   . Glaucoma   . HOH (hard of hearing)   . Hypertension   . Multiple allergies   . Peripheral vascular disease (HCC)    varicose veins   . PONV (postoperative nausea and vomiting)   . Rhinitis   . Wears glasses     Past Surgical History:  Procedure Laterality Date  . ABDOMINAL HYSTERECTOMY    . COLECTOMY  2008   obstruction  . DIAGNOSTIC LAPAROSCOPY     exp  . DILATION AND CURETTAGE OF UTERUS    . FUNCTIONAL ENDOSCOPIC SINUS SURGERY  2009   bilat ethm,frontal,max  . NASAL SINUS SURGERY Bilateral 10/26/2012   Procedure: BILATERAL ENDOSCOPIC REVISION ETHMOID MAXILLARY AND FRONTAL SINUS SURGERY  ;  Surgeon: Izora Gala, MD;  Location: [facility];  Service: ENT;  Laterality: Bilateral;  . OVARIAN CYST SURGERY    . TUBAL LIGATION    . URETER SURGERY     cut accidentally during exp lap-ovarien cyst    There were no vitals filed for this visit.  Subjective Assessment - 07/08/17 0920    Subjective  Patient reports that she has been feeling bad recently, I have been sick, "Shoulder is moving better"    Currently in Pain?  Yes    Pain Score  3     Pain Location  Shoulder    Pain  Orientation  Right    Pain Descriptors / Indicators  Sore;Spasm    Aggravating Factors   stress, lying on the right side         [facility] PT Assessment - 07/08/17 0001      AROM   Right Shoulder Flexion  135 Degrees    Right Shoulder ABduction  126 Degrees                  [facility] Adult PT Treatment/Exercise - 07/08/17 0001      Moist Heat Therapy   Number Minutes Moist Heat  15 Minutes    Moist Heat Location  Cervical;Shoulder      Electrical Stimulation   Electrical Stimulation Location  right upper trap/shoulder    Electrical Stimulation Action  IFC    Electrical Stimulation Parameters  supine    Electrical Stimulation Goals  Pain      Manual Therapy   Manual Therapy  Soft tissue mobilization;Passive ROM    Soft tissue mobilization  to the right upper trap and the rhomboids    Passive ROM  PROM of the right shoulder all motions  PT Short Term Goals - 05/16/17 1133      PT SHORT TERM GOAL #1   Title  independent with initial HEP    Status  Achieved        PT Long Term Goals - 07/08/17 0948      PT LONG TERM GOAL #1   Title  independent with advanced HEP    Status  Achieved      PT LONG TERM GOAL #2   Title  decrease pain 50% with activity    Status  Achieved      PT LONG TERM GOAL #3   Title  increase right shoulder AROM to 140 degrees flexion    Status  Partially Met      PT LONG TERM GOAL #4   Title  understand proper posture and body mechanics    Status  Partially Met            Plan - 07/08/17 0946    Clinical Impression Statement  Patient continues to have stress and some increased tightness and spasms inthe upper trap and the rhomboid area that is very tender.  Her ROM is improving over all and doing better with ability to dress and do hair without pain, reports that she cannot lie on the right side    PT Frequency  2x / week    PT Duration  4 weeks    PT Next Visit Plan  will work on trigger points to see if we  can help her pain and quality of movements for ADL's    Consulted and Agree with Plan of Care  Patient       Patient will benefit from skilled therapeutic intervention in order to improve the following deficits and impairments:  Decreased strength, Postural dysfunction, Improper body mechanics, Pain, Decreased range of motion  Visit Diagnosis: Acute pain of right shoulder - Plan: PT plan of care cert/re-cert  Stiffness of right shoulder, not elsewhere classified - Plan: PT plan of care cert/re-cert  Cramp and spasm - Plan: PT plan of care cert/re-cert     Problem List Patient Active Problem List   Diagnosis Date Noted  . GERD (gastroesophageal reflux disease) 07/03/2016  . Hemidiaphragm paralysis 07/03/2016  . Nodule of right lung 06/19/2016  . Essential hypertension 03/29/2016  . Glaucoma 03/29/2016  . Arthritis 03/29/2016  . Sinusitis, chronic 11/25/2012  . Cough variant asthma 11/25/2012  . Cough 11/24/2012    Sumner Boast., PT 07/08/2017, 9:50 AM  [place], [place], [address] Phone: [phone]   Fax:  [phone]  Name: TRICIA PLEDGER MRN: [id number] Date of Birth: 23-Oct-1932

## 2017-07-17 ENCOUNTER — Encounter: Payer: Self-pay | Admitting: Physical Therapy

## 2017-07-17 ENCOUNTER — Ambulatory Visit: Payer: Medicare Other | Admitting: Physical Therapy

## 2017-07-17 DIAGNOSIS — R252 Cramp and spasm: Secondary | ICD-10-CM | POA: Diagnosis not present

## 2017-07-17 DIAGNOSIS — M25511 Pain in right shoulder: Secondary | ICD-10-CM | POA: Diagnosis not present

## 2017-07-17 DIAGNOSIS — M25611 Stiffness of right shoulder, not elsewhere classified: Secondary | ICD-10-CM

## 2017-07-17 NOTE — Therapy (Signed)
[place], [place], [address] Phone: [phone]   Fax:  [phone]  Physical Therapy Treatment  Patient Details  Name: LAM BJORKLUND MRN: [id number] Date of Birth: June 08, 1933 Referring Provider: Rhona Raider   Encounter Date: 07/17/2017  PT End of Session - 07/17/17 0954    Visit Number  8    Date for PT Re-Evaluation  08/09/17    PT Start Time  0930    PT Stop Time  1015    PT Time Calculation (min)  45 min    Activity Tolerance  Patient tolerated treatment well    Behavior During Therapy  [facility] for tasks assessed/performed       Past Medical History:  Diagnosis Date  . Arthritis   . Asthma   . Complication of anesthesia    hard to wake up  . GERD (gastroesophageal reflux disease)   . Glaucoma   . HOH (hard of hearing)   . Hypertension   . Multiple allergies   . Peripheral vascular disease (HCC)    varicose veins   . PONV (postoperative nausea and vomiting)   . Rhinitis   . Wears glasses     Past Surgical History:  Procedure Laterality Date  . ABDOMINAL HYSTERECTOMY    . COLECTOMY  2008   obstruction  . DIAGNOSTIC LAPAROSCOPY     exp  . DILATION AND CURETTAGE OF UTERUS    . FUNCTIONAL ENDOSCOPIC SINUS SURGERY  2009   bilat ethm,frontal,max  . NASAL SINUS SURGERY Bilateral 10/26/2012   Procedure: BILATERAL ENDOSCOPIC REVISION ETHMOID MAXILLARY AND FRONTAL SINUS SURGERY  ;  Surgeon: Izora Gala, MD;  Location: [facility];  Service: ENT;  Laterality: Bilateral;  . OVARIAN CYST SURGERY    . TUBAL LIGATION    . URETER SURGERY     cut accidentally during exp lap-ovarien cyst    There were no vitals filed for this visit.  Subjective Assessment - 07/17/17 0926    Subjective  Patient reports that she is feeling better from her sickness, reports that she was very busy with Christmas and had some increased stress"    Currently in Pain?  Yes    Pain Score  5     Pain  Location  Shoulder    Pain Orientation  Right    Aggravating Factors   stress                      [facility] Adult PT Treatment/Exercise - 07/17/17 0001      Self-Care   Self-Care  Other Self-Care Comments    Other Self-Care Comments   went over exercises, posture, body mechanics and stretches that she is to do after D/C, talked with her about stress relief      Moist Heat Therapy   Number Minutes Moist Heat  15 Minutes    Moist Heat Location  Cervical;Shoulder      Electrical Stimulation   Electrical Stimulation Location  right upper trap/shoulder    Electrical Stimulation Action  IFC    Electrical Stimulation Parameters  supine    Electrical Stimulation Goals  Pain      Manual Therapy   Manual Therapy  Soft tissue mobilization;Passive ROM    Soft tissue mobilization  to the right upper trap and the rhomboids    Passive ROM  PROM of the right shoulder all motions  PT Short Term Goals - 05/16/17 1133      PT SHORT TERM GOAL #1   Title  independent with initial HEP    Status  Achieved        PT Long Term Goals - 2017-08-01 0956      PT LONG TERM GOAL #1   Title  independent with advanced HEP    Status  Achieved      PT LONG TERM GOAL #2   Title  decrease pain 50% with activity    Status  Achieved      PT LONG TERM GOAL #3   Title  increase right shoulder AROM to 140 degrees flexion    Status  Achieved      PT LONG TERM GOAL #4   Title  understand proper posture and body mechanics    Status  Achieved            Plan - Aug 01, 2017 0955    Clinical Impression Statement  Patient now with normal ROM, reports less pain overall and easier function.  She reports stress still bothers her, and also sleeping on the right.  Overall she reports "no problems"    PT Next Visit Plan  D/C with all goals met    Consulted and Agree with Plan of Care  Patient       Patient will benefit from skilled therapeutic intervention in order to improve  the following deficits and impairments:  Decreased strength, Postural dysfunction, Improper body mechanics, Pain, Decreased range of motion  Visit Diagnosis: Acute pain of right shoulder  Stiffness of right shoulder, not elsewhere classified  Cramp and spasm   G-Codes - 2017/08/01 0957    Functional Assessment Tool Used (Outpatient Only)  foto 33% limitation    Functional Limitation  Carrying, moving and handling objects    Carrying, Moving and Handling Objects Current Status (C5885)  At least 20 percent but less than 40 percent impaired, limited or restricted    Carrying, Moving and Handling Objects Goal Status (O2774)  At least 40 percent but less than 60 percent impaired, limited or restricted    Carrying, Moving and Handling Objects Discharge Status [phone])  At least 20 percent but less than 40 percent impaired, limited or restricted       Problem List Patient Active Problem List   Diagnosis Date Noted  . GERD (gastroesophageal reflux disease) 07/03/2016  . Hemidiaphragm paralysis 07/03/2016  . Nodule of right lung 06/19/2016  . Essential hypertension 03/29/2016  . Glaucoma 03/29/2016  . Arthritis 03/29/2016  . Sinusitis, chronic 11/25/2012  . Cough variant asthma 11/25/2012  . Cough 11/24/2012    Sumner Boast., PT 08-01-17, 9:59 AM  [place], [place], [address] Phone: [phone]   Fax:  [phone]  Name: CHARLIZE HATHAWAY MRN: [id number] Date of Birth: 24-Aug-1932

## 2017-07-21 DIAGNOSIS — H401122 Primary open-angle glaucoma, left eye, moderate stage: Secondary | ICD-10-CM | POA: Diagnosis not present

## 2017-07-21 DIAGNOSIS — H401113 Primary open-angle glaucoma, right eye, severe stage: Secondary | ICD-10-CM | POA: Diagnosis not present

## 2017-07-21 DIAGNOSIS — L720 Epidermal cyst: Secondary | ICD-10-CM | POA: Diagnosis not present

## 2017-08-26 ENCOUNTER — Ambulatory Visit (INDEPENDENT_AMBULATORY_CARE_PROVIDER_SITE_OTHER): Payer: Medicare HMO | Admitting: Allergy and Immunology

## 2017-08-26 ENCOUNTER — Other Ambulatory Visit: Payer: Self-pay | Admitting: Allergy and Immunology

## 2017-08-26 ENCOUNTER — Encounter: Payer: Self-pay | Admitting: Allergy and Immunology

## 2017-08-26 VITALS — BP 132/76 | HR 64 | Resp 16

## 2017-08-26 DIAGNOSIS — D721 Eosinophilia, unspecified: Secondary | ICD-10-CM

## 2017-08-26 DIAGNOSIS — L2089 Other atopic dermatitis: Secondary | ICD-10-CM | POA: Diagnosis not present

## 2017-08-26 DIAGNOSIS — J4541 Moderate persistent asthma with (acute) exacerbation: Secondary | ICD-10-CM | POA: Diagnosis not present

## 2017-08-26 DIAGNOSIS — M3500 Sicca syndrome, unspecified: Secondary | ICD-10-CM

## 2017-08-26 DIAGNOSIS — J3089 Other allergic rhinitis: Secondary | ICD-10-CM | POA: Diagnosis not present

## 2017-08-26 MED ORDER — AZELASTINE HCL 0.15 % NA SOLN: 1.0000 | Freq: Two times a day (BID) | NASAL | 5 refills | Status: DC

## 2017-08-26 MED ORDER — METHYLPREDNISOLONE ACETATE 80 MG/ML IJ SUSP
80.0000 mg | Freq: Once | INTRAMUSCULAR | Status: AC
  Administered 2017-08-26: 80 mg via INTRAMUSCULAR

## 2017-08-26 NOTE — Progress Notes (Signed)
Follow-up Note  Referring Provider: Lilian Coma., MD Primary Provider: Lilian Coma., MD Date of Office Visit: 08/26/2017  Subjective:   Cassandra Sims (DOB: 20-Oct-1932) is a 84 y.o. female who returns to the [facility] on 08/26/2017 in re-evaluation of the following:  HPI: Cassandra Sims returns to this clinic in reevaluation of her asthma and allergic rhinitis and dry eye syndrome and atopic dermatosis.  Her last visit to this clinic was 27 May 2017.  At that point in time we had her discontinue all antihistamines because of her significant dry eye syndrome.  She has been intensely itchy in her nose ever since she stopped her antihistamines.  When she performs Nettie pot she will get out clear to yellow to green nasal discharge which has been a chronic issue for many years.  In addition, she has had some issues with shortness of breath and a slight cough over the course of the past month or so.  She has not been using her short acting bronchodilator.  She has developed a patch of her dermatitis affecting her right forearm and she still continues to have some involving her shoulders.  She has stopped using her topical agents even though they did work well in the past.  Her eyes are still intensely dry.  She has a visit with her eye doctor this week.  Allergies as of 08/26/2017      Reactions   Penicillins Shortness Of Breath   Adhesive [tape] Itching   Clindamycin/lincomycin Itching   Codeine Nausea And Vomiting   Darvocet [propoxyphene N-acetaminophen]    HTN   Dilaudid [hydromorphone Hcl] Nausea And Vomiting   Doxycycline    Pt unsure of reaction.    Flagyl [metronidazole]    Lactose Intolerance (gi)    Levofloxacin    Joint pain   Macrodantin [nitrofurantoin Macrocrystal]    Morphine And Related    hallucinate   Premarin [conjugated Estrogens] Itching   Zantac [ranitidine Hcl]    Itching    Zofran [ondansetron Hcl]    HTN   Nitrofuran Derivatives  Rash   Sulfa Antibiotics Rash      Medication List      albuterol 108 (90 Base) MCG/ACT inhaler Commonly known as:  PROVENTIL HFA;VENTOLIN HFA Inhale 2 puffs into the lungs every 4 (four) hours as needed for wheezing. Reported on 10/10/2015   amLODipine 5 MG tablet Commonly known as:  NORVASC   calcium-vitamin D 500-200 MG-UNIT tablet Commonly known as:  OSCAL WITH D Take 1 tablet by mouth 2 (two) times daily.   CENTRUM SILVER PO Take 1 tablet by mouth daily.   cholecalciferol 1000 units tablet Commonly known as:  VITAMIN D Take 1,000 Units by mouth daily.   famotidine 20 MG tablet Commonly known as:  PEPCID Take 1 tablet (20 mg total) by mouth 2 (two) times daily.   fluticasone 50 MCG/ACT nasal spray Commonly known as:  FLONASE Place 1 spray into both nostrils 2 (two) times daily.   LUTEIN 20 Caps Take 1 capsule by mouth every morning.   meloxicam 7.5 MG tablet Commonly known as:  MOBIC   mometasone 0.1 % ointment Commonly known as:  ELOCON Apply topically 2 (two) times daily.   mometasone-formoterol 100-5 MCG/ACT Aero Commonly known as:  DULERA Take 2 puffs first thing in am and then another 2 puffs about 12 hours later.   montelukast 10 MG tablet Commonly known as:  SINGULAIR Take 10 mg by  mouth at bedtime.   pimecrolimus 1 % cream Commonly known as:  ELIDEL Apply topically 2 (two) times daily.   psyllium 58.6 % powder Commonly known as:  METAMUCIL 1 tsp every morning   SYSTANE 0.4-0.3 % Gel ophthalmic gel Generic drug:  Polyethyl Glycol-Propyl Glycol Place 1 application into both eyes as needed.   tacrolimus 0.1 % ointment Commonly known as:  PROTOPIC Apply topically 2 (two) times daily.   telmisartan 40 MG tablet Commonly known as:  MICARDIS Take 40 mg by mouth daily.   TYLENOL ARTHRITIS PAIN PO Take by mouth.   ZIOPTAN 0.0015 % Soln Generic drug:  Tafluprost INT 1 GTT IN OU Q NIGHT       Past Medical History:  Diagnosis Date  .  Arthritis   . Asthma   . Complication of anesthesia    hard to wake up  . GERD (gastroesophageal reflux disease)   . Glaucoma   . HOH (hard of hearing)   . Hypertension   . Multiple allergies   . Peripheral vascular disease (HCC)    varicose veins   . PONV (postoperative nausea and vomiting)   . Rhinitis   . Wears glasses     Past Surgical History:  Procedure Laterality Date  . ABDOMINAL HYSTERECTOMY    . COLECTOMY  2008   obstruction  . DIAGNOSTIC LAPAROSCOPY     exp  . DILATION AND CURETTAGE OF UTERUS    . FUNCTIONAL ENDOSCOPIC SINUS SURGERY  2009   bilat ethm,frontal,max  . NASAL SINUS SURGERY Bilateral 10/26/2012   Procedure: BILATERAL ENDOSCOPIC REVISION ETHMOID MAXILLARY AND FRONTAL SINUS SURGERY  ;  Surgeon: Izora Gala, MD;  Location: [facility];  Service: ENT;  Laterality: Bilateral;  . OVARIAN CYST SURGERY    . TUBAL LIGATION    . URETER SURGERY     cut accidentally during exp lap-ovarien cyst    Review of systems negative except as noted in HPI / PMHx or noted below:  Review of Systems  Constitutional: Negative.   HENT: Negative.   Eyes: Negative.   Respiratory: Negative.   Cardiovascular: Negative.   Gastrointestinal: Negative.   Genitourinary: Negative.   Musculoskeletal: Negative.   Skin: Negative.   Neurological: Negative.   Endo/Heme/Allergies: Negative.   Psychiatric/Behavioral: Negative.      Objective:   Vitals:   08/26/17 1043  BP: 132/76  Pulse: 64  Resp: 16          Physical Exam  Constitutional: She is well-developed, well-nourished, and in no distress.  HENT:  Head: Normocephalic.  Right Ear: Tympanic membrane, external ear and ear canal normal.  Left Ear: Tympanic membrane, external ear and ear canal normal.  Nose: Nose normal. No mucosal edema or rhinorrhea.  Mouth/Throat: Uvula is midline, oropharynx is clear and moist and mucous membranes are normal. No oropharyngeal exudate.  Eyes: Conjunctivae are  normal.  Neck: Trachea normal. No tracheal tenderness present. No tracheal deviation present. No thyromegaly present.  Cardiovascular: Normal rate, regular rhythm, S1 normal, S2 normal and normal heart sounds.  No murmur heard. Pulmonary/Chest: Breath sounds normal. No stridor. No respiratory distress. She has no wheezes (Expiratory wheezes right greater than left anterior lung fields). She has no rales.  Musculoskeletal: She exhibits no edema.  Lymphadenopathy:       Head (right side): No tonsillar adenopathy present.       Head (left side): No tonsillar adenopathy present.    She has no cervical adenopathy.  Neurological: She  is alert. Gait normal.  Skin: Rash (Erythematous indurated plaque-like dermatitis involving right forearm.) noted. She is not diaphoretic. No erythema. Nails show no clubbing.  Psychiatric: Mood and affect normal.    Diagnostics:    Spirometry was performed and demonstrated an FEV1 of 1.28 at 73 % of predicted.  The patient had an Asthma Control Test with the following results: ACT Total Score: 20.    Review of blood tests obtained 27 May 2017 identified a negative ANA  Assessment and Plan:   1. Asthma, not well controlled, moderate persistent, with acute exacerbation   2. Other allergic rhinitis   3. Other atopic dermatitis   4. Sicca syndrome ([place])   5. Eosinophilia     1. For skin inflammation:   A. Elidel followed by mometasone 0.1% ointment 1-2 times per day  3. For nose issue:   A. Continue Flonase 1 spray each nostril 2 time per day  B. Continue montelukast 10 mg tablet 1 time per day  C. Start Azelastine 1 spray each nostril 2 times per day  4. For lung issue:   A. Continue Dulera 200 - two inhalations two times per day  B.  Depo-Medrol 80 IM delivered in clinic today  5. If needed:   A. Proventil HFA (or similar) 2 inhalations two times per day  B. Nasal saline  6. Blood - CBC w/diff: Biological agent?  7. Return to clinic in  12 weeks or earlier if problem  I will start Dyann Ruddle on a topical nasal antihistamine with the hope that she gets some relief regarding her rhinitic symptoms without developing more dry eye issue.  And I have given her a steroid to help with her respiratory tract inflammation which appears to be involving not just her upper airway but her lower airway as well.  I will see if she is a candidate for an anti-IL 5 biological agent given her previous documentation of significant eosinophilia and the fact that she still continues to have respiratory tract issues in the face of utilizing preventative controller agents.  I will regroup with her in 12 weeks or earlier if there is a problem.  I will contact her by telephone regarding the results of her blood test.  Allena Katz, MD Allergy / [place]

## 2017-08-26 NOTE — Patient Instructions (Addendum)
  1. For skin inflammation:   A. Elidel followed by mometasone 0.1% ointment 1-2 times per day  3. For nose issue:   A. Continue Flonase 1 spray each nostril 2 time per day  B. Continue montelukast 10 mg tablet 1 time per day  C. Start Azelastine 1 spray each nostril 2 times per day  4. For lung issue:   A. Continue Dulera 200 - two inhalations two times per day  B.  Depo-Medrol 80 IM delivered in clinic today  5. If needed:   A. Proventil HFA (or similar) 2 inhalations two times per day  B. Nasal saline  6. Blood - CBC w/diff: Biological agent?  7. Return to clinic in 12 weeks or earlier if problem

## 2017-08-27 ENCOUNTER — Encounter: Payer: Self-pay | Admitting: Allergy and Immunology

## 2017-08-27 LAB — CBC WITH DIFFERENTIAL/PLATELET
BASOS ABS: 0.1 10*3/uL (ref 0.0–0.2)
Basos: 1 %
EOS (ABSOLUTE): 0.4 10*3/uL (ref 0.0–0.4)
Eos: 7 %
HEMOGLOBIN: 13.6 g/dL (ref 11.1–15.9)
Hematocrit: 40.8 % (ref 34.0–46.6)
Immature Grans (Abs): 0 10*3/uL (ref 0.0–0.1)
Immature Granulocytes: 0 %
LYMPHS: 29 %
Lymphocytes Absolute: 1.6 10*3/uL (ref 0.7–3.1)
MCH: 30.6 pg (ref 26.6–33.0)
MCHC: 33.3 g/dL (ref 31.5–35.7)
MCV: 92 fL (ref 79–97)
MONOCYTES: 13 %
Monocytes Absolute: 0.8 10*3/uL (ref 0.1–0.9)
Neutrophils Absolute: 2.8 10*3/uL (ref 1.4–7.0)
Neutrophils: 50 %
Platelets: 217 10*3/uL (ref 150–379)
RBC: 4.44 x10E6/uL (ref 3.77–5.28)
RDW: 14 % (ref 12.3–15.4)
WBC: 5.6 10*3/uL (ref 3.4–10.8)

## 2017-09-01 ENCOUNTER — Telehealth: Payer: Self-pay | Admitting: Pulmonary Disease

## 2017-09-01 NOTE — Telephone Encounter (Signed)
Called and spoke with pt who stated her allergist did bloodwork and had high levels of esinophils.  Pt stated the allergist wanted to put her on [place] but due to a lot of the side effects noted on med, pt is worried about starting on it due to the side effects.  Pt is wanting to know if it would be safe for her to begin Fasenra or if a different injection would be better for pt.  Dr. Lake Bells, please advise on this for pt.  Thanks!

## 2017-09-03 NOTE — Telephone Encounter (Signed)
Cassandra Sims is not a particularly risky medicine.  I'm sure if Dr. Carmelina Peal feels it is appropriate for her it is fine.

## 2017-09-03 NOTE — Telephone Encounter (Signed)
Patient aware nothing further needed. 

## 2017-09-10 ENCOUNTER — Telehealth: Payer: Self-pay | Admitting: Allergy and Immunology

## 2017-09-10 NOTE — Telephone Encounter (Signed)
Pt called and said that you wanted to know if she was still taking Doxycycline and she said no because you wanted her take a injection that she was submitted for and was approved. But her eye dr put her back on Doxycycline. [phone].

## 2017-09-11 NOTE — Telephone Encounter (Signed)
Called and spoke with patient and informed her that it was ok. She stated that she was going to be starting [place] and asked if she needed to bring anyone with her because she would be coming by herself. I informed her that I did not think she needed to bring someone but if it made her more comfortable then that would be fine.

## 2017-09-11 NOTE — Telephone Encounter (Signed)
OK to remain on doxycycline

## 2017-09-19 ENCOUNTER — Ambulatory Visit: Payer: Medicare Other | Admitting: Adult Health

## 2017-09-19 DIAGNOSIS — M810 Age-related osteoporosis without current pathological fracture: Secondary | ICD-10-CM | POA: Insufficient documentation

## 2017-09-26 ENCOUNTER — Ambulatory Visit: Payer: Medicare HMO

## 2017-09-26 ENCOUNTER — Ambulatory Visit (INDEPENDENT_AMBULATORY_CARE_PROVIDER_SITE_OTHER): Payer: Medicare HMO

## 2017-09-26 ENCOUNTER — Telehealth: Payer: Self-pay | Admitting: Allergy and Immunology

## 2017-09-26 DIAGNOSIS — J4541 Moderate persistent asthma with (acute) exacerbation: Secondary | ICD-10-CM | POA: Diagnosis not present

## 2017-09-26 MED ORDER — BENRALIZUMAB 30 MG/ML ~~LOC~~ SOSY
30.0000 mg | PREFILLED_SYRINGE | SUBCUTANEOUS | Status: AC
  Administered 2017-09-26 – 2017-10-24 (×2): 30 mg via SUBCUTANEOUS

## 2017-09-26 MED ORDER — EPINEPHRINE 0.3 MG/0.3ML IJ SOAJ: 0.3000 mg | Freq: Once | INTRAMUSCULAR | 0 refills | Status: AC

## 2017-09-26 NOTE — Telephone Encounter (Signed)
Patient had fasenra injection this morning Can patient take her doxycycline along with the injection today?? She claims KOZLOW had questions about this Please call patient to answer any questions

## 2017-09-26 NOTE — Telephone Encounter (Signed)
L/m for patient to contact me.  I had already consulted Dr Neldon Mc and told her that he advised okay to take

## 2017-09-26 NOTE — Progress Notes (Signed)
Immunotherapy   Patient Details  Name: Cassandra Sims MRN: [id number] Date of Birth: 03/11/1933  09/26/2017  Vivi Martens started injections for  Berna Bue  Following schedule: Every 4 weeks for 3 doses, then every 8 weeks.  Epi-Pen:Prescription for Epi-Pen given Consent signed and patient instructions given.  Patient received injection in left arm with no complaints. Patient did wait 1 hour post injection. She did not show any signs of local or systemic reaction upone departure.    Lonn Georgia I Rorie Delmore 09/26/2017, 10:25 AM

## 2017-09-29 ENCOUNTER — Ambulatory Visit: Payer: Self-pay

## 2017-10-02 ENCOUNTER — Telehealth: Payer: Self-pay | Admitting: *Deleted

## 2017-10-02 ENCOUNTER — Ambulatory Visit: Payer: Medicare Other | Admitting: Pulmonary Disease

## 2017-10-02 NOTE — Telephone Encounter (Signed)
Please inform patient that this would be an unusual side effect from Cassandra Sims. It may be because she has contracted a mild viral URTI. Lets see how things go the next week and plan on receiving her next injection one month after her initial injection.

## 2017-10-02 NOTE — Telephone Encounter (Signed)
Patient called and wanted to know if she is possibly having a side effect from her Fasenra injection. She received the injection on Friday 09/26/17 and about 3 days later she started waking up and having lots of congestion and having to clear her throat also feeling very tired. Could this been a side effect of the Berna Bue? Please advise.

## 2017-10-02 NOTE — Telephone Encounter (Signed)
Informed patient of plan. She states she does not react to medications well and she also states it could be the Doxycyline that is making her feel bad. Told her to call next week with an update.

## 2017-10-06 ENCOUNTER — Encounter: Payer: Self-pay | Admitting: Pulmonary Disease

## 2017-10-06 ENCOUNTER — Ambulatory Visit (INDEPENDENT_AMBULATORY_CARE_PROVIDER_SITE_OTHER): Payer: Medicare HMO | Admitting: Pulmonary Disease

## 2017-10-06 VITALS — BP 119/70 | HR 67 | Ht 62.5 in | Wt 120.0 lb

## 2017-10-06 DIAGNOSIS — J328 Other chronic sinusitis: Secondary | ICD-10-CM

## 2017-10-06 DIAGNOSIS — J329 Chronic sinusitis, unspecified: Secondary | ICD-10-CM

## 2017-10-06 DIAGNOSIS — K219 Gastro-esophageal reflux disease without esophagitis: Secondary | ICD-10-CM | POA: Diagnosis not present

## 2017-10-06 DIAGNOSIS — J45991 Cough variant asthma: Secondary | ICD-10-CM | POA: Diagnosis not present

## 2017-10-06 DIAGNOSIS — R0600 Dyspnea, unspecified: Secondary | ICD-10-CM | POA: Diagnosis not present

## 2017-10-06 DIAGNOSIS — M25569 Pain in unspecified knee: Secondary | ICD-10-CM | POA: Diagnosis not present

## 2017-10-06 NOTE — Progress Notes (Signed)
Subjective:   PATIENT ID: Cassandra Sims GENDER: female DOB: April 14, 1933, MRN: [id number]  Synopsis: Former IT consultant Patient with Asthma, Left hemidiaphram paralysis, GERD, chronic rhinitis and a pulmonary nodule  HPI  Chief Complaint  Patient presents with  . Follow-up    former [facility] pt being treated for cough variant asthma- is on Fasenra through Dr. Bruna Potter office.    Cassandra Sims was diagnosed with a left hemidiaphragm paralysis several years ago.   > she thinks it was due to an abdominal  She is having some sinus congestion and mucus production > worse lately > lots of drainage  Dyspnea: > has been worse in the last few months > she isn't sure what is causing it > not wheezing much > she does have a dry cough, doesn't produce mucus > no chest pain, but she has a little flank pain on the left > no recent fever, no chills  She has noticed more leg swelling lately.  It has been significantly worse.    Past Medical History:  Diagnosis Date  . Arthritis   . Asthma   . Complication of anesthesia    hard to wake up  . GERD (gastroesophageal reflux disease)   . Glaucoma   . HOH (hard of hearing)   . Hypertension   . Multiple allergies   . Peripheral vascular disease (HCC)    varicose veins   . PONV (postoperative nausea and vomiting)   . Rhinitis   . Wears glasses      Review of Systems  Constitutional: Positive for malaise/fatigue. Negative for chills.  HENT: Positive for congestion and sinus pain.   Respiratory: Positive for cough and shortness of breath. Negative for sputum production and wheezing.   Cardiovascular: Positive for leg swelling. Negative for chest pain.  Neurological: Negative for weakness.      Objective:  Physical Exam   Vitals:   10/06/17 1345  BP: 119/70  Pulse: 67  SpO2: 97%  Weight: 120 lb (54.4 kg)  Height: 5' 2.5" (1.588 m)  RA   Gen: well appearing HENT: OP clear, TM's clear, neck supple PULM: CTA B diminished left base,  normal percussion CV: RRR, no mgr, trace edema GI: BS+, soft, nontender Derm: significant swelling left lower leg Psyche: normal mood and affect   CBC    Component Value Date/Time   WBC 5.6 08/26/2017 1108   WBC 8.6 03/29/2016 1329   RBC 4.44 08/26/2017 1108   RBC 4.49 03/29/2016 1329   HGB 13.6 08/26/2017 1108   HCT 40.8 08/26/2017 1108   PLT 217 08/26/2017 1108   MCV 92 08/26/2017 1108   MCH 30.6 08/26/2017 1108   MCHC 33.3 08/26/2017 1108   MCHC 34.0 03/29/2016 1329   RDW 14.0 08/26/2017 1108   LYMPHSABS 1.6 08/26/2017 1108   MONOABS 1.1 (H) 03/29/2016 1329   EOSABS 0.4 08/26/2017 1108   BASOSABS 0.1 08/26/2017 1108    PFT 08/2017 [place]: FEV 1 1.28L 06/06/16: FVC 2.16 L (88%) FEV1 1.52 L (84%) FEV1/FVC 0.70 FEF 25-75 0.97 L (75%) negative bronchodilator response TLC 4.43 L (90%) RV 93% ERV 58% DLCO corrected 75% (Hgb 15.1)  08/04/12: FVC 2.81 L (110%) FEV1 1.87 L (107%) FEV1/FVC 0.67 FEF 25-75 0.16 L (57%) negative bronchodilator response TLC 4.33 L (95%) RV 79% ERV 87% DLCO uncorrected 93%  IMAGING CXR 08/2017 images independently reviewed showing an elevated left hemidiaphragm.  CT CHEST W/O 12/17/16: Left lower lobe 6 mm nodule unchanged compared to prior  CT CHEST W/ CONTRAST 06/19/16:  Mild centrilobular emphysema. 6 mm nodule along the minor fissure likely subpleural lymph node. Calcified lung nodules also noted. No pleural effusion or thickening. No pericardial effusion. No pathologic mediastinal adenopathy. Low attenuation lesions in the liver measuring up to 2.3 cm likely cysts.   BARIUM SWALLOW/ESOPHAGRAM 04/05/16 (per radiologist):  Moderate gastroesophageal reflux. No esophageal dysmotility, stricture or mass.  SNIFF TEST 04/05/16 (per radiologist):  Mild paradoxical movement of the diaphragms with forceful inhalation is consistent with paralysis of the LEFT hemidiaphragm. Chronic elevation of LEFT diaphragm.    LABS 05/27/17 ANA:  Negative   03/29/16 CBC:   8.6/13.8/40.6/232 Eos:  0.6 IgE:  14 RAST Panel:  Negative          Assessment & Plan:   Dyspnea, unspecified type - Plan: B Nat Peptide  Arthralgia of lower leg, unspecified laterality - Plan: D-Dimer, Quantitative  Cough variant asthma  Other chronic sinusitis  Gastroesophageal reflux disease, esophagitis presence not specified  Chronic sinusitis, unspecified location  Discussion: This is a pleasant 84 year old female who has eosinophilia, a diagnosis of asthma, and allergic rhinitis.  She also has a left hemi-diaphragm paralysis.  Recently her lung function testing has shown a decline in the FEV1 associated with worsening shortness of breath.  She does not have mucus production or worsening cough or wheeze.  She has been having more dyspnea.  I explained to her that the differential diagnosis of dyspnea is broad and includes lung disease heart disease anemia neurologic conditions and other conditions.  I think because her FEV1 is down its very reasonable to treat as if she has severe persistent asthma though it would be a bit more satisfying of a diagnosis if she had more airway symptoms.  That being said I think it is reasonable to continue treatment with Berna Bue.  However, she does have leg swelling on exam so I think it is important for [place] to check for blood clot and congestive heart failure.  She was not anemic on a recent blood test and her most recent chest x-ray showed no interval change.  Plan: Shortness of breath: We will check a test called a proBNP to look to see if the heart is causing you to have swelling If that test is abnormal we will arrange for an echocardiogram  Left leg swelling: Because of the pain you have in the calf were going to check a blood test called a d-dimer to see if you have evidence of a blood clot If that test is abnormal we will arrange for an ultrasound of your leg  Severe persistent asthma with eosinophilia: Continue Dulera 2 puffs twice a  day Continue [place] as directed by the allergist Dr. Neldon Mc  We will see you back in about 2 months after you have had more injections of the Fasenra or sooner if needed     Current Outpatient Medications:  .  Acetaminophen (TYLENOL ARTHRITIS PAIN PO), Take by mouth., Disp: , Rfl:  .  albuterol (PROVENTIL HFA;VENTOLIN HFA) 108 (90 BASE) MCG/ACT inhaler, Inhale 2 puffs into the lungs every 4 (four) hours as needed for wheezing. Reported on 10/10/2015, Disp: , Rfl:  .  amLODipine (NORVASC) 5 MG tablet, Take 10 mg by mouth daily. , Disp: , Rfl:  .  Azelastine HCl 0.15 % SOLN, Place 1 spray into both nostrils 2 (two) times daily., Disp: 30 mL, Rfl: 5 .  calcium-vitamin D (OSCAL WITH D) 500-200 MG-UNIT per tablet, Take 1  tablet by mouth 2 (two) times daily. , Disp: , Rfl:  .  cholecalciferol (VITAMIN D) 1000 UNITS tablet, Take 1,000 Units by mouth daily., Disp: , Rfl:  .  famotidine (PEPCID) 20 MG tablet, Take 1 tablet (20 mg total) by mouth 2 (two) times daily., Disp: 30 tablet, Rfl: 6 .  fluticasone (FLONASE) 50 MCG/ACT nasal spray, Place 1 spray into both nostrils 2 (two) times daily., Disp: , Rfl:  .  meloxicam (MOBIC) 7.5 MG tablet, , Disp: , Rfl:  .  Misc Natural Products (LUTEIN 20) CAPS, Take 1 capsule by mouth every morning., Disp: , Rfl:  .  mometasone (ELOCON) 0.1 % ointment, Apply topically 2 (two) times daily., Disp: 45 g, Rfl: 3 .  mometasone-formoterol (DULERA) 100-5 MCG/ACT AERO, Take 2 puffs first thing in am and then another 2 puffs about 12 hours later. (Patient taking differently: Take 1 puff first thing in am and then another 1 puff about 12 hours later.), Disp: 3 Inhaler, Rfl: 3 .  montelukast (SINGULAIR) 10 MG tablet, Take 10 mg by mouth at bedtime., Disp: , Rfl:  .  Multiple Vitamins-Minerals (CENTRUM SILVER PO), Take 1 tablet by mouth daily. , Disp: , Rfl:  .  pimecrolimus (ELIDEL) 1 % cream, Apply topically 2 (two) times daily., Disp: 30 g, Rfl: 3 .   Polyethyl Glycol-Propyl Glycol (SYSTANE) 0.4-0.3 % GEL ophthalmic gel, Place 1 application into both eyes as needed., Disp: , Rfl:  .  psyllium (METAMUCIL) 58.6 % powder, 1 tsp every morning, Disp: , Rfl:  .  tacrolimus (PROTOPIC) 0.1 % ointment, Apply topically 2 (two) times daily., Disp: 60 g, Rfl: 5 .  telmisartan (MICARDIS) 40 MG tablet, Take 80 mg by mouth daily. , Disp: , Rfl:  .  ZIOPTAN 0.0015 % SOLN, INT 1 GTT IN OU Q NIGHT, Disp: , Rfl: 11  Current Facility-Administered Medications:  .  Benralizumab SOSY 30 mg, 30 mg, Subcutaneous, Q28 days, Kozlow, Donnamarie Poag, MD, 30 mg at 09/26/17 1024

## 2017-10-06 NOTE — Patient Instructions (Signed)
Shortness of breath: We will check a test called a proBNP to look to see if the heart is causing you to have swelling If that test is abnormal we will arrange for an echocardiogram  Left leg swelling: Because of the pain you have in the calf were going to check a blood test called a d-dimer to see if you have evidence of a blood clot If that test is abnormal we will arrange for an ultrasound of your leg  Severe persistent asthma with eosinophilia: Continue Dulera 2 puffs twice a day Continue [place] as directed by the allergist Dr. Neldon Mc  We will see you back in about 2 months after you have had more injections of the Fasenra or sooner if needed

## 2017-10-07 LAB — D-DIMER, QUANTITATIVE: D-DIMER: 0.25 mg/L FEU (ref 0.00–0.49)

## 2017-10-07 LAB — BRAIN NATRIURETIC PEPTIDE: BNP: 51.2 pg/mL (ref 0.0–100.0)

## 2017-10-08 ENCOUNTER — Telehealth: Payer: Self-pay | Admitting: Pulmonary Disease

## 2017-10-08 NOTE — Telephone Encounter (Signed)
Spoke with pt. She is requesting the results from her blood work done on 10/06/17.  BQ - please advise on results. Thanks!

## 2017-10-08 NOTE — Telephone Encounter (Signed)
normal

## 2017-10-08 NOTE — Telephone Encounter (Signed)
Spoke with the pt and notified her that labs are normal  She verbalized understanding

## 2017-10-20 ENCOUNTER — Ambulatory Visit: Payer: Medicare Other | Admitting: Pulmonary Disease

## 2017-10-24 ENCOUNTER — Ambulatory Visit (INDEPENDENT_AMBULATORY_CARE_PROVIDER_SITE_OTHER): Payer: Medicare HMO

## 2017-10-24 DIAGNOSIS — J4550 Severe persistent asthma, uncomplicated: Secondary | ICD-10-CM

## 2017-10-24 DIAGNOSIS — J4541 Moderate persistent asthma with (acute) exacerbation: Secondary | ICD-10-CM

## 2017-11-21 ENCOUNTER — Ambulatory Visit: Payer: Medicare HMO

## 2017-11-21 ENCOUNTER — Ambulatory Visit (INDEPENDENT_AMBULATORY_CARE_PROVIDER_SITE_OTHER): Payer: Medicare HMO

## 2017-11-21 DIAGNOSIS — J4550 Severe persistent asthma, uncomplicated: Secondary | ICD-10-CM | POA: Diagnosis not present

## 2017-11-21 DIAGNOSIS — J4540 Moderate persistent asthma, uncomplicated: Secondary | ICD-10-CM

## 2017-11-26 ENCOUNTER — Ambulatory Visit: Payer: Medicare HMO | Admitting: Allergy and Immunology

## 2017-11-26 ENCOUNTER — Encounter: Payer: Self-pay | Admitting: Allergy and Immunology

## 2017-11-26 ENCOUNTER — Other Ambulatory Visit: Payer: Self-pay | Admitting: Allergy and Immunology

## 2017-11-26 VITALS — BP 152/84 | HR 72 | Resp 20

## 2017-11-26 DIAGNOSIS — D721 Eosinophilia, unspecified: Secondary | ICD-10-CM

## 2017-11-26 DIAGNOSIS — D698 Other specified hemorrhagic conditions: Secondary | ICD-10-CM

## 2017-11-26 DIAGNOSIS — J3089 Other allergic rhinitis: Secondary | ICD-10-CM | POA: Diagnosis not present

## 2017-11-26 DIAGNOSIS — J4540 Moderate persistent asthma, uncomplicated: Secondary | ICD-10-CM

## 2017-11-26 LAB — PULMONARY FUNCTION TEST

## 2017-11-26 MED ORDER — MOMETASONE FURO-FORMOTEROL FUM 200-5 MCG/ACT IN AERO: 2.0000 | INHALATION_SPRAY | Freq: Two times a day (BID) | RESPIRATORY_TRACT | 3 refills | Status: DC

## 2017-11-26 NOTE — Progress Notes (Signed)
Follow-up Note  Referring Provider: Lilian Coma., MD Primary Provider: Lilian Coma., MD Date of Office Visit: 11/26/2017  Subjective:   Cassandra Sims (DOB: 06-19-1933) is a 84 y.o. female who returns to the [facility] on 11/26/2017 in re-evaluation of the following:  HPI: Mkayla returns to this clinic in reevaluation of asthma and allergic rhinitis and a history of atopic dermatitis and dry eye syndrome.  Her last visit to this clinic was 26 August 2017.  She is now using mepolizumab and she has had an excellent response with her 3 injections of this anti-IL-5 biological agent.  She has had almost no issues with her chest and very little issues with her nose and rarely uses a short acting bronchodilator and has not required a systemic steroid or antibiotic to treat any type of respiratory tract issue.  She did visit with her pulmonologist in March for a period of dyspnea on exertion but for the most part this has resolved and diagnostic evaluation for that issue did not identify any specific etiologic factor contributing to respiratory tract symptoms.  She does not have any skin issues involving her shoulders or forearm at this point.  She is not using any topical anti-inflammatory agent at this point.  However, she did develop a rather big bruise on her right calf for which she went to see her vascular doctor who performed an ultrasound which did not identify any clot within the blood vessels but she has noticed that she has developed these tiny little red dots around her right leg especially around her right knee over the course of the past week or so.  They are nonprogressive but have all come up as a crop and seem to be stable at this point.  She has no associated systemic or constitutional symptoms.  Allergies as of 11/26/2017      Reactions   Penicillins Shortness Of Breath   Adhesive [tape] Itching   Clindamycin/lincomycin Itching   Codeine Nausea And Vomiting     Darvocet [propoxyphene N-acetaminophen]    HTN   Dilaudid [hydromorphone Hcl] Nausea And Vomiting   Doxycycline    Pt unsure of reaction.    Flagyl [metronidazole]    Lactose Intolerance (gi)    Levofloxacin    Joint pain   Macrodantin [nitrofurantoin Macrocrystal]    Morphine And Related    hallucinate   Premarin [conjugated Estrogens] Itching   Zantac [ranitidine Hcl]    Itching    Zofran [ondansetron Hcl]    HTN   Nitrofuran Derivatives Rash   Sulfa Antibiotics Rash      Medication List      albuterol 108 (90 Base) MCG/ACT inhaler Commonly known as:  PROVENTIL HFA;VENTOLIN HFA Inhale 2 puffs into the lungs every 4 (four) hours as needed for wheezing. Reported on 10/10/2015   amLODipine 5 MG tablet Commonly known as:  NORVASC Take 10 mg by mouth daily.   Azelastine HCl 0.15 % Soln Place 1 spray into both nostrils 2 (two) times daily.   bimatoprost 0.01 % Soln Commonly known as:  LUMIGAN Place 1 drop into both eyes nightly.   calcium-vitamin D 500-200 MG-UNIT tablet Commonly known as:  OSCAL WITH D Take 1 tablet by mouth 2 (two) times daily.   CENTRUM SILVER PO Take 1 tablet by mouth daily.   cholecalciferol 1000 units tablet Commonly known as:  VITAMIN D Take 1,000 Units by mouth daily.   fluticasone 50 MCG/ACT nasal spray  Commonly known as:  FLONASE Place 1 spray into both nostrils 2 (two) times daily.   LUTEIN 20 Caps Take 1 capsule by mouth every morning.   meloxicam 7.5 MG tablet Commonly known as:  MOBIC   mometasone 0.1 % ointment Commonly known as:  ELOCON Apply topically 2 (two) times daily.   mometasone-formoterol 100-5 MCG/ACT Aero Commonly known as:  DULERA Take 2 puffs first thing in am and then another 2 puffs about 12 hours later.   montelukast 10 MG tablet Commonly known as:  SINGULAIR Take 10 mg by mouth at bedtime.   pimecrolimus 1 % cream Commonly known as:  ELIDEL Apply topically 2 (two) times daily.   psyllium 58.6 %  powder Commonly known as:  METAMUCIL 1 tsp every morning   RHOPRESSA 0.02 % Soln Generic drug:  Netarsudil Dimesylate   SYSTANE 0.4-0.3 % Gel ophthalmic gel Generic drug:  Polyethyl Glycol-Propyl Glycol Place 1 application into both eyes as needed.   tacrolimus 0.1 % ointment Commonly known as:  PROTOPIC Apply topically 2 (two) times daily.   telmisartan 40 MG tablet Commonly known as:  MICARDIS Take 80 mg by mouth daily.   telmisartan-hydrochlorothiazide 80-12.5 MG tablet Commonly known as:  MICARDIS HCT Take 1 tablet by mouth daily.   TYLENOL ARTHRITIS PAIN PO Take by mouth.   ZIOPTAN 0.0015 % Soln Generic drug:  Tafluprost INT 1 GTT IN OU Q NIGHT       Past Medical History:  Diagnosis Date  . Arthritis   . Asthma   . Complication of anesthesia    hard to wake up  . GERD (gastroesophageal reflux disease)   . Glaucoma   . HOH (hard of hearing)   . Hypertension   . Multiple allergies   . Peripheral vascular disease (HCC)    varicose veins   . PONV (postoperative nausea and vomiting)   . Rhinitis   . Wears glasses     Past Surgical History:  Procedure Laterality Date  . ABDOMINAL HYSTERECTOMY    . COLECTOMY  2008   obstruction  . DIAGNOSTIC LAPAROSCOPY     exp  . DILATION AND CURETTAGE OF UTERUS    . FUNCTIONAL ENDOSCOPIC SINUS SURGERY  2009   bilat ethm,frontal,max  . NASAL SINUS SURGERY Bilateral 10/26/2012   Procedure: BILATERAL ENDOSCOPIC REVISION ETHMOID MAXILLARY AND FRONTAL SINUS SURGERY  ;  Surgeon: Izora Gala, MD;  Location: [facility];  Service: ENT;  Laterality: Bilateral;  . OVARIAN CYST SURGERY    . TUBAL LIGATION    . URETER SURGERY     cut accidentally during exp lap-ovarien cyst    Review of systems negative except as noted in HPI / PMHx or noted below:  Review of Systems  Constitutional: Negative.   HENT: Negative.   Eyes: Negative.   Respiratory: Negative.   Cardiovascular: Negative.   Gastrointestinal:  Negative.   Genitourinary: Negative.   Musculoskeletal: Negative.   Skin: Negative.   Neurological: Negative.   Endo/Heme/Allergies: Negative.   Psychiatric/Behavioral: Negative.      Objective:   Vitals:   11/26/17 1023  BP: (!) 152/84  Pulse: 72  Resp: 20          Physical Exam  HENT:  Head: Normocephalic.  Right Ear: Tympanic membrane, external ear and ear canal normal.  Left Ear: Tympanic membrane, external ear and ear canal normal.  Nose: Nose normal. No mucosal edema or rhinorrhea.  Mouth/Throat: Uvula is midline, oropharynx is clear and moist  and mucous membranes are normal. No oropharyngeal exudate.  Eyes: Conjunctivae are normal.  Neck: Trachea normal. No tracheal tenderness present. No tracheal deviation present. No thyromegaly present.  Cardiovascular: Normal rate, regular rhythm, S1 normal, S2 normal and normal heart sounds.  No murmur heard. Pulmonary/Chest: Breath sounds normal. No stridor. No respiratory distress. She has no wheezes. She has no rales.  Musculoskeletal: She exhibits no edema.  Lymphadenopathy:       Head (right side): No tonsillar adenopathy present.       Head (left side): No tonsillar adenopathy present.    She has no cervical adenopathy.  Neurological: She is alert.  Skin: Rash (Very faint stippling across right lower extremity coalescing most frequently around the right knee.) noted. She is not diaphoretic. No erythema. Nails show no clubbing.    Diagnostics:    Spirometry was performed and demonstrated an FEV1 of 1.47 at 82 % of predicted.  The patient had an Asthma Control Test with the following results: ACT Total Score: 22.    Assessment and Plan:   1. Asthma, moderate persistent, well-controlled   2. Other allergic rhinitis   3. Eosinophilia   4. Capillary fragility (HCC)     1. For skin inflammation:   A. Elidel followed by mometasone 0.1% ointment 1-2 times per day  2. For nose issue:   A. Flonase 1 spray each  nostril 2 time per day  B. montelukast 10 mg tablet 1 time per day  C. Azelastine 1 spray each nostril 2 times per day  3. For lung issue:   A.  Dulera 100 - two inhalations two times per day  B.  Fasenra every 8 weeks  4. If needed:   A. Proventil HFA (or similar) 2 inhalations two times per day  B. Nasal saline  5. Return to clinic in 12 weeks or earlier if problem  Yaelis appears to be doing very well and I suspect that her improvement is based upon the use of her mepolizumab and we will continue to have her use this agent as well as other anti-inflammatory agents for her airway and skin as noted above.  The cause of her lower extremity stippling, presumed fragility of her capillaries, is unknown but this does not appear to be a progressive issue and we will hold off on any evaluation at this point unless of course this is progressive or she develops associated systemic or constitutional symptoms.  She will contact me should she develop significant problems over the course of the next 12 weeks but otherwise I will just see her in his clinic at that point in time.  Allena Katz, MD Allergy / Immunology [place]

## 2017-11-26 NOTE — Patient Instructions (Addendum)
  1. For skin inflammation:   A. Elidel followed by mometasone 0.1% ointment 1-2 times per day  2. For nose issue:   A. Flonase 1 spray each nostril 2 time per day  B. montelukast 10 mg tablet 1 time per day  C. Azelastine 1 spray each nostril 2 times per day  3. For lung issue:   A.  Dulera 100 - two inhalations two times per day  B.  Fasenra every 8 weeks  4. If needed:   A. Proventil HFA (or similar) 2 inhalations two times per day  B. Nasal saline  5. Return to clinic in 12 weeks or earlier if problem

## 2017-11-27 ENCOUNTER — Ambulatory Visit: Payer: Medicare Other | Admitting: Pulmonary Disease

## 2017-11-27 ENCOUNTER — Encounter: Payer: Self-pay | Admitting: Allergy and Immunology

## 2017-11-27 ENCOUNTER — Other Ambulatory Visit: Payer: Self-pay | Admitting: *Deleted

## 2017-11-27 MED ORDER — BUDESONIDE-FORMOTEROL FUMARATE 160-4.5 MCG/ACT IN AERO: INHALATION_SPRAY | RESPIRATORY_TRACT | 5 refills | Status: DC

## 2017-11-28 ENCOUNTER — Other Ambulatory Visit: Payer: Self-pay | Admitting: Allergy and Immunology

## 2017-12-08 ENCOUNTER — Telehealth: Payer: Self-pay | Admitting: Pulmonary Disease

## 2017-12-08 NOTE — Telephone Encounter (Signed)
Spoke with pt, she is having a total knee replacement on June 17th.[facility] Dr. Reynaldo Minium will be doing the surgery. BQ will you be able to fill out the form for medical clearance or does she need an office visit?\   Patient Instructions by Juanito Doom, MD at 10/06/2017 1:30 PM  Author: Juanito Doom, MD Author Type: Physician Filed: 10/06/2017 2:09 PM  Note Status: Signed Cosign: Cosign Not Required Encounter Date: 10/06/2017  Editor: Juanito Doom, MD (Physician)    Shortness of breath: We will check a test called a proBNP to look to see if the heart is causing you to have swelling If that test is abnormal we will arrange for an echocardiogram  Left leg swelling: Because of the pain you have in the calf were going to check a blood test called a d-dimer to see if you have evidence of a blood clot If that test is abnormal we will arrange for an ultrasound of your leg  Severe persistent asthma with eosinophilia: Continue Dulera 2 puffs twice a day Continue [place] as directed by the allergist Dr. Neldon Mc  We will see you back in about 2 months after you have had more injections of the Fasenra or sooner if needed

## 2017-12-08 NOTE — Telephone Encounter (Signed)
ATC pt, no answer. Left message for pt to call back.  

## 2017-12-08 NOTE — Telephone Encounter (Signed)
I can fill it out

## 2017-12-09 NOTE — Telephone Encounter (Signed)
Ok. Is there a form we need to fill out? I could not locate anything from Dr. Peri Maris office. I anted route to his nurse but was unsure of her name. I called [facility] and left a message requesting them to fax over the clearance form. Will await call back.   Fax [phone]

## 2017-12-10 NOTE — Telephone Encounter (Signed)
Pt is calling back about medical clearance  [phone]

## 2017-12-10 NOTE — Telephone Encounter (Signed)
I have checked Dr. Anastasia Pall look at [place]. This document is not located there. I have left another message at [facility] to have this refaxed again.

## 2017-12-12 NOTE — Telephone Encounter (Signed)
We still have not received anything from [facility].  I have spoken with the pt this morning and made her aware of this. Advised her that I would send a message to Dr. Lake Bells to see if he would be willing to just write a letter clearing for her surgery. Pt was last seen on 10/06/17 by Dr. Lake Bells.  Dr. Lake Bells - would you be willing to write this letter or will the pt need an OV first? Thanks!

## 2017-12-12 NOTE — Telephone Encounter (Signed)
I would just call [facility] and ask them to send the form again.

## 2017-12-12 NOTE — Telephone Encounter (Signed)
Called [facility] for surgery clearance forms to be faxed back to [place].  Message left for them to call us back or fax forms.

## 2017-12-17 NOTE — H&P (Signed)
TOTAL KNEE ADMISSION H&P  Patient is being admitted for right total knee arthroplasty.  Subjective:  Chief Complaint:right knee pain.  HPI: Cassandra Sims, 84 y.o. female, has a history of pain and functional disability in the right knee due to arthritis and has failed non-surgical conservative treatments for greater than 12 weeks to includeNSAID's and/or analgesics, corticosteriod injections and activity modification.  Onset of symptoms was gradual, starting 2+ years ago with gradually worsening course since that time. The patient noted prior procedures on the knee to include  arthroscopy on the right knee(s). Surgical note meniscal tear and grade 4 exposed bone.  Patient currently rates pain in the right knee(s) at 9 out of 10 with activity. Patient has night pain, worsening of pain with activity and weight bearing, pain that interferes with activities of daily living and crepitus.  Patient has evidence of joint space narrowing by imaging studies.  There is no active infection.  PCP-Dr. Gavin Potters /Dr Jobe   Patient Active Problem List   Diagnosis Date Noted  . GERD (gastroesophageal reflux disease) 07/03/2016  . Hemidiaphragm paralysis 07/03/2016  . Nodule of right lung 06/19/2016  . Essential hypertension 03/29/2016  . Glaucoma 03/29/2016  . Arthritis 03/29/2016  . Sinusitis, chronic 11/25/2012  . Cough variant asthma 11/25/2012  . Cough 11/24/2012   Past Medical History:  Diagnosis Date  . Arthritis   . Asthma   . Complication of anesthesia    hard to wake up  . GERD (gastroesophageal reflux disease)   . Glaucoma   . HOH (hard of hearing)   . Hypertension   . Multiple allergies   . Peripheral vascular disease (HCC)    varicose veins   . PONV (postoperative nausea and vomiting)   . Rhinitis   . Wears glasses     Past Surgical History:  Procedure Laterality Date  . ABDOMINAL HYSTERECTOMY    . COLECTOMY  2008   obstruction  . DIAGNOSTIC LAPAROSCOPY     exp  .  DILATION AND CURETTAGE OF UTERUS    . FUNCTIONAL ENDOSCOPIC SINUS SURGERY  2009   bilat ethm,frontal,max  . NASAL SINUS SURGERY Bilateral 10/26/2012   Procedure: BILATERAL ENDOSCOPIC REVISION ETHMOID MAXILLARY AND FRONTAL SINUS SURGERY  ;  Surgeon: Izora Gala, MD;  Location: [facility];  Service: ENT;  Laterality: Bilateral;  . OVARIAN CYST SURGERY    . TUBAL LIGATION    . URETER SURGERY     cut accidentally during exp lap-ovarien cyst    Current Facility-Administered Medications  Medication Dose Route Frequency Provider Last Rate Last Dose  . Benralizumab SOSY 30 mg  30 mg Subcutaneous Q28 days Jiles Prows, MD   30 mg at 10/24/17 1018   Current Outpatient Medications  Medication Sig Dispense Refill Last Dose  . Acetaminophen (TYLENOL ARTHRITIS PAIN PO) Take by mouth.   Taking  . albuterol (PROVENTIL HFA;VENTOLIN HFA) 108 (90 BASE) MCG/ACT inhaler Inhale 2 puffs into the lungs every 4 (four) hours as needed for wheezing. Reported on 10/10/2015   Taking  . amLODipine (NORVASC) 5 MG tablet Take 10 mg by mouth daily.    Taking  . Azelastine HCl 0.15 % SOLN Place 1 spray into both nostrils 2 (two) times daily. 30 mL 5 Taking  . bimatoprost (LUMIGAN) 0.01 % SOLN Place 1 drop into both eyes nightly.   Taking  . budesonide-formoterol (SYMBICORT) 160-4.5 MCG/ACT inhaler Inhale two puffs twice daily to prevent cough or wheeze. 1 Inhaler 5   .  calcium-vitamin D (OSCAL WITH D) 500-200 MG-UNIT per tablet Take 1 tablet by mouth 2 (two) times daily.    Taking  . cholecalciferol (VITAMIN D) 1000 UNITS tablet Take 1,000 Units by mouth daily.   Taking  . fluticasone (FLONASE) 50 MCG/ACT nasal spray Place 1 spray into both nostrils 2 (two) times daily.   Taking  . meloxicam (MOBIC) 7.5 MG tablet    Taking  . Misc Natural Products (LUTEIN 20) CAPS Take 1 capsule by mouth every morning.   Taking  . mometasone (ELOCON) 0.1 % ointment Apply topically 2 (two) times daily. 45 g 3 Taking  .  mometasone-formoterol (DULERA) 100-5 MCG/ACT AERO Take 2 puffs first thing in am and then another 2 puffs about 12 hours later. (Patient taking differently: Take 1 puff first thing in am and then another 1 puff about 12 hours later.) 3 Inhaler 3 Taking  . mometasone-formoterol (DULERA) 200-5 MCG/ACT AERO Inhale 2 puffs into the lungs 2 (two) times daily. 1 Inhaler 3   . montelukast (SINGULAIR) 10 MG tablet Take 10 mg by mouth at bedtime.   Taking  . Multiple Vitamins-Minerals (CENTRUM SILVER PO) Take 1 tablet by mouth daily.    Taking  . pimecrolimus (ELIDEL) 1 % cream Apply topically 2 (two) times daily. 30 g 3 Taking  . Polyethyl Glycol-Propyl Glycol (SYSTANE) 0.4-0.3 % GEL ophthalmic gel Place 1 application into both eyes as needed.   Taking  . psyllium (METAMUCIL) 58.6 % powder 1 tsp every morning   Taking  . RHOPRESSA 0.02 % SOLN    Taking  . tacrolimus (PROTOPIC) 0.1 % ointment Apply topically 2 (two) times daily. 60 g 5 Taking  . telmisartan (MICARDIS) 40 MG tablet Take 80 mg by mouth daily.    Taking  . telmisartan-hydrochlorothiazide (MICARDIS HCT) 80-12.5 MG tablet Take 1 tablet by mouth daily.  11 Taking  . ZIOPTAN 0.0015 % SOLN INT 1 GTT IN OU Q NIGHT  11 Taking   Allergies  Allergen Reactions  . Penicillins Shortness Of Breath  . Adhesive [Tape] Itching  . Clindamycin/Lincomycin Itching  . Codeine Nausea And Vomiting  . Darvocet [Propoxyphene N-Acetaminophen]     HTN  . Dilaudid [Hydromorphone Hcl] Nausea And Vomiting  . Doxycycline     Pt unsure of reaction.   . Flagyl [Metronidazole]   . Lactose Intolerance (Gi)   . Levofloxacin     Joint pain  . Macrodantin [Nitrofurantoin Macrocrystal]   . Morphine And Related     hallucinate  . Premarin [Conjugated Estrogens] Itching  . Zantac [Ranitidine Hcl]     Itching   . Zofran [Ondansetron Hcl]     HTN  . Nitrofuran Derivatives Rash  . Sulfa Antibiotics Rash    Social History   Tobacco Use  . Smoking status: Never  Smoker  . Smokeless tobacco: Never Used  Substance Use Topics  . Alcohol use: No    Family History  Problem Relation Age of Onset  . Glaucoma Mother   . Arthritis Mother   . Lung disease Sister   . Breast cancer Daughter   . Diabetes Mellitus I Other      ROS  Objective:  Physical Exam  Vital signs in last 24 hours: BP: ()/()  Arterial Line BP: ()/()   Labs:   Estimated body mass index is 21.6 kg/m as calculated from the following:   Height as of 10/06/17: 5' 2.5" (1.588 m).   Weight as of 10/06/17: 54.4 kg (120 lb).  Imaging Review Plain radiographs demonstrate  degenerative joint disease of the right knee(s). The overall alignment issignificant valgus. The bone quality appears to be adequate for age and reported activity level. X-rays dated 09/19/17 show bone-on-bone lateral compartment right knee, with narrowing of patellar compartment.   Preoperative templating of the joint replacement has been completed, documented, and submitted to the Operating Room personnel in order to optimize intra-operative equipment management.   Anticipated LOS equal to or greater than 2 midnights due to - Age 65 and older with one or more of the following:  - Obesity  - Expected need for hospital services (PT, OT, Nursing) required for safe  discharge  - Anticipated need for postoperative skilled nursing care or inpatient rehab  - Active co-morbidities: abdominal scar tissue and Shortness of breath with ongoing lung abnormality OR        Assessment/Plan:  End stage arthritis, right knee   The patient history, physical examination, clinical judgment of the provider and imaging studies are consistent with end stage degenerative joint disease of the right knee(s) and total knee arthroplasty is deemed medically necessary. The treatment options including medical management, injection therapy arthroscopy and arthroplasty were discussed at length. The risks and benefits of total knee  arthroplasty were presented and reviewed. The risks due to aseptic loosening, infection, stiffness, patella tracking problems, thromboembolic complications and other imponderables were discussed. The patient acknowledged the explanation, agreed to proceed with the plan and consent was signed. Patient is being admitted for inpatient treatment for surgery, pain control, PT, OT, prophylactic antibiotics, VTE prophylaxis, progressive ambulation and ADL's and discharge planning. The patient is planning to be discharged home with home health services.   Patient notes difficulty with multiple pain meds-has done well with tramadol in the past.

## 2017-12-26 NOTE — Patient Instructions (Addendum)
Cassandra Sims  12/26/2017   Your procedure is scheduled on: 01-05-18   Report to [facility] Main  Entrance    Report to admitting at 7:00AM    Call this number if you have problems the morning of surgery [phone]     Remember: Do not eat food or drink liquids :After Midnight.     Take these medicines the morning of surgery with A SIP OF WATER: ALBUTEROL INHALER IF NEEDED (PLEASE BRING), DULERA INHALER, NASAL SPRAY, EYE DROPS                                You may not have any metal on your body including hair pins and              piercings  Do not wear jewelry, make-up, lotions, powders or perfumes, deodorant             Do not wear nail polish.  Do not shave  48 hours prior to surgery.              Do not bring valuables to the hospital. Cassandra Sims.  Contacts, dentures or bridgework may not be worn into surgery.  Leave suitcase in the car. After surgery it may be brought to your room.                 Please read over the following fact sheets you were given: _____________________________________________________________________             [facility] - Preparing for Surgery Before surgery, you can play an important role.  Because skin is not sterile, your skin needs to be as free of germs as possible.  You can reduce the number of germs on your skin by washing with CHG (chlorahexidine gluconate) soap before surgery.  CHG is an antiseptic cleaner which kills germs and bonds with the skin to continue killing germs even after washing. Please DO NOT use if you have an allergy to CHG or antibacterial soaps.  If your skin becomes reddened/irritated stop using the CHG and inform your nurse when you arrive at Short Stay. Do not shave (including legs and underarms) for at least 48 hours prior to the first CHG shower.  You may shave your face/neck. Please follow these instructions carefully:  1.  Shower with  CHG Soap the night before surgery and the  morning of Surgery.  2.  If you choose to wash your hair, wash your hair first as usual with your  normal  shampoo.  3.  After you shampoo, rinse your hair and body thoroughly to remove the  shampoo.                           4.  Use CHG as you would any other liquid soap.  You can apply chg directly  to the skin and wash                       Gently with a scrungie or clean washcloth.  5.  Apply the CHG Soap to your body ONLY FROM THE NECK DOWN.   Do not use on face/ open  Wound or open sores. Avoid contact with eyes, ears mouth and genitals (private parts).                       Wash face,  Genitals (private parts) with your normal soap.             6.  Wash thoroughly, paying special attention to the area where your surgery  will be performed.  7.  Thoroughly rinse your body with warm water from the neck down.  8.  DO NOT shower/wash with your normal soap after using and rinsing off  the CHG Soap.                9.  Pat yourself dry with a clean towel.            10.  Wear clean pajamas.            11.  Place clean sheets on your bed the night of your first shower and do not  sleep with pets. Day of Surgery : Do not apply any lotions/deodorants the morning of surgery.  Please wear clean clothes to the hospital/surgery center.  FAILURE TO FOLLOW THESE INSTRUCTIONS MAY RESULT IN THE CANCELLATION OF YOUR SURGERY PATIENT SIGNATURE_________________________________  NURSE SIGNATURE__________________________________  ________________________________________________________________________   Cassandra Sims  An incentive spirometer is a tool that can help keep your lungs clear and active. This tool measures how well you are filling your lungs with each breath. Taking long deep breaths may help reverse or decrease the chance of developing breathing (pulmonary) problems (especially infection) following:  A long period of  time when you are unable to move or be active. BEFORE THE PROCEDURE   If the spirometer includes an indicator to show your best effort, your nurse or respiratory therapist will set it to a desired goal.  If possible, sit up straight or lean slightly forward. Try not to slouch.  Hold the incentive spirometer in an upright position. INSTRUCTIONS FOR USE  1. Sit on the edge of your bed if possible, or sit up as far as you can in bed or on a chair. 2. Hold the incentive spirometer in an upright position. 3. Breathe out normally. 4. Place the mouthpiece in your mouth and seal your lips tightly around it. 5. Breathe in slowly and as deeply as possible, raising the piston or the ball toward the top of the column. 6. Hold your breath for 3-5 seconds or for as long as possible. Allow the piston or ball to fall to the bottom of the column. 7. Remove the mouthpiece from your mouth and breathe out normally. 8. Rest for a few seconds and repeat Steps 1 through 7 at least 10 times every 1-2 hours when you are awake. Take your time and take a few normal breaths between deep breaths. 9. The spirometer may include an indicator to show your best effort. Use the indicator as a goal to work toward during each repetition. 10. After each set of 10 deep breaths, practice coughing to be sure your lungs are clear. If you have an incision (the cut made at the time of surgery), support your incision when coughing by placing a pillow or rolled up towels firmly against it. Once you are able to get out of bed, walk around indoors and cough well. You may stop using the incentive spirometer when instructed by your caregiver.  RISKS AND COMPLICATIONS  Take your time so you do not get  dizzy or light-headed.  If you are in pain, you may need to take or ask for pain medication before doing incentive spirometry. It is harder to take a deep breath if you are having pain. AFTER USE  Rest and breathe slowly and easily.  It can  be helpful to keep track of a log of your progress. Your caregiver can provide you with a simple table to help with this. If you are using the spirometer at home, follow these instructions: [place] IF:   You are having difficultly using the spirometer.  You have trouble using the spirometer as often as instructed.  Your pain medication is not giving enough relief while using the spirometer.  You develop fever of 100.5 F (38.1 C) or higher. SEEK IMMEDIATE MEDICAL CARE IF:   You cough up bloody sputum that had not been present before.  You develop fever of 102 F (38.9 C) or greater.  You develop worsening pain at or near the incision site. MAKE SURE YOU:   Understand these instructions.  Will watch your condition.  Will get help right away if you are not doing well or get worse. Document Released: 11/18/2006 Document Revised: 09/30/2011 Document Reviewed: 01/19/2007 [employer] Patient Information 2014 [employer], [place].   ________________________________________________________________________  WHAT IS A BLOOD TRANSFUSION? Blood Transfusion Information  A transfusion is the replacement of blood or some of its parts. Blood is made up of multiple cells which provide different functions.  Red blood cells carry oxygen and are used for blood loss replacement.  White blood cells fight against infection.  Platelets control bleeding.  Plasma helps clot blood.  Other blood products are available for specialized needs, such as hemophilia or other clotting disorders. BEFORE THE TRANSFUSION  Who gives blood for transfusions?   Healthy volunteers who are fully evaluated to make sure their blood is safe. This is blood bank blood. Transfusion therapy is the safest it has ever been in the practice of medicine. Before blood is taken from a donor, a complete history is taken to make sure that person has no history of diseases nor engages in risky social behavior (examples are  intravenous drug use or sexual activity with multiple partners). The donor's travel history is screened to minimize risk of transmitting infections, such as malaria. The donated blood is tested for signs of infectious diseases, such as HIV and hepatitis. The blood is then tested to be sure it is compatible with you in order to minimize the chance of a transfusion reaction. If you or a relative donates blood, this is often done in anticipation of surgery and is not appropriate for emergency situations. It takes many days to process the donated blood. RISKS AND COMPLICATIONS Although transfusion therapy is very safe and saves many lives, the main dangers of transfusion include:   Getting an infectious disease.  Developing a transfusion reaction. This is an allergic reaction to something in the blood you were given. Every precaution is taken to prevent this. The decision to have a blood transfusion has been considered carefully by your caregiver before blood is given. Blood is not given unless the benefits outweigh the risks. AFTER THE TRANSFUSION  Right after receiving a blood transfusion, you will usually feel much better and more energetic. This is especially true if your red blood cells have gotten low (anemic). The transfusion raises the level of the red blood cells which carry oxygen, and this usually causes an energy increase.  The nurse administering the transfusion will  monitor you carefully for complications. HOME CARE INSTRUCTIONS  No special instructions are needed after a transfusion. You may find your energy is better. Speak with your caregiver about any limitations on activity for underlying diseases you may have. SEEK MEDICAL CARE IF:   Your condition is not improving after your transfusion.  You develop redness or irritation at the intravenous (IV) site. SEEK IMMEDIATE MEDICAL CARE IF:  Any of the following symptoms occur over the next 12 hours:  Shaking chills.  You have a  temperature by mouth above 102 F (38.9 C), not controlled by medicine.  Chest, back, or muscle pain.  People around you feel you are not acting correctly or are confused.  Shortness of breath or difficulty breathing.  Dizziness and fainting.  You get a rash or develop hives.  You have a decrease in urine output.  Your urine turns a dark color or changes to pink, red, or brown. Any of the following symptoms occur over the next 10 days:  You have a temperature by mouth above 102 F (38.9 C), not controlled by medicine.  Shortness of breath.  Weakness after normal activity.  The white part of the eye turns yellow (jaundice).  You have a decrease in the amount of urine or are urinating less often.  Your urine turns a dark color or changes to pink, red, or brown. Document Released: 07/05/2000 Document Revised: 09/30/2011 Document Reviewed: 02/22/2008 [facility] Patient Information 2014 [place], [place].  _______________________________________________________________________

## 2017-12-26 NOTE — Progress Notes (Signed)
PULMONOLOGY DR. Pennie Banter  ON CHART   CT CHEST 12-17-16 Epic

## 2017-12-29 ENCOUNTER — Other Ambulatory Visit: Payer: Self-pay

## 2017-12-29 ENCOUNTER — Encounter (HOSPITAL_COMMUNITY): Payer: Self-pay

## 2017-12-29 ENCOUNTER — Encounter (HOSPITAL_COMMUNITY)
Admission: RE | Admit: 2017-12-29 | Discharge: 2017-12-29 | Disposition: A | Discharge status: Home / Self Care | Payer: Medicare HMO | Source: Ambulatory Visit | Attending: Orthopedic Surgery | Admitting: Orthopedic Surgery

## 2017-12-29 DIAGNOSIS — Z01812 Encounter for preprocedural laboratory examination: Secondary | ICD-10-CM | POA: Insufficient documentation

## 2017-12-29 DIAGNOSIS — M1711 Unilateral primary osteoarthritis, right knee: Secondary | ICD-10-CM | POA: Insufficient documentation

## 2017-12-29 DIAGNOSIS — Z01810 Encounter for preprocedural cardiovascular examination: Secondary | ICD-10-CM | POA: Insufficient documentation

## 2017-12-29 HISTORY — DX: Prediabetes: R73.03

## 2017-12-29 HISTORY — DX: Anxiety disorder, unspecified: F41.9

## 2017-12-29 HISTORY — DX: Hypoglycemia, unspecified: E16.2

## 2017-12-29 HISTORY — DX: Family history of other specified conditions: Z84.89

## 2017-12-29 HISTORY — DX: Headache, unspecified: R51.9

## 2017-12-29 HISTORY — DX: Headache: R51

## 2017-12-29 LAB — COMPREHENSIVE METABOLIC PANEL
ALT: 42 U/L (ref 14–54)
AST: 40 U/L (ref 15–41)
Albumin: 4.2 g/dL (ref 3.5–5.0)
Alkaline Phosphatase: 55 U/L (ref 38–126)
Anion gap: 8 (ref 5–15)
BUN: 19 mg/dL (ref 6–20)
CO2: 30 mmol/L (ref 22–32)
Calcium: 9 mg/dL (ref 8.9–10.3)
Chloride: 101 mmol/L (ref 101–111)
Creatinine, Ser: 0.62 mg/dL (ref 0.44–1.00)
GFR calc Af Amer: >60 mL/min (ref >60)
GFR calc non Af Amer: >60 mL/min (ref >60)
Glucose, Bld: 101 mg/dL — ABNORMAL HIGH (ref 65–99)
Potassium: 3.2 mmol/L — ABNORMAL LOW (ref 3.5–5.1)
Sodium: 139 mmol/L (ref 135–145)
Total Bilirubin: 0.5 mg/dL (ref 0.3–1.2)
Total Protein: 6.4 g/dL — ABNORMAL LOW (ref 6.5–8.1)

## 2017-12-29 LAB — HEMOGLOBIN A1C
Hgb A1c MFr Bld: 5.9 % — ABNORMAL HIGH (ref 4.8–5.6)
Mean Plasma Glucose: 122.63 mg/dL

## 2017-12-29 LAB — URINALYSIS, ROUTINE W REFLEX MICROSCOPIC
Bilirubin Urine: NEGATIVE
Glucose, UA: NEGATIVE mg/dL
Hgb urine dipstick: NEGATIVE
Ketones, ur: NEGATIVE mg/dL
Leukocytes, UA: NEGATIVE
Nitrite: NEGATIVE
Protein, ur: NEGATIVE mg/dL
Specific Gravity, Urine: 1.008 (ref 1.005–1.030)
pH: 6 (ref 5.0–8.0)

## 2017-12-29 LAB — CBC
HCT: 39.9 % (ref 36.0–46.0)
Hemoglobin: 13.5 g/dL (ref 12.0–15.0)
MCH: 31.3 pg (ref 26.0–34.0)
MCHC: 33.8 g/dL (ref 30.0–36.0)
MCV: 92.4 fL (ref 78.0–100.0)
Platelets: 214 10*3/uL (ref 150–400)
RBC: 4.32 MIL/uL (ref 3.87–5.11)
RDW: 13.7 % (ref 11.5–15.5)
WBC: 5.5 10*3/uL (ref 4.0–10.5)

## 2017-12-29 LAB — ABO/RH: ABO/RH(D): B POS

## 2017-12-29 LAB — SURGICAL PCR SCREEN
MRSA, PCR: NEGATIVE
Staphylococcus aureus: NEGATIVE

## 2017-12-29 LAB — PROTIME-INR
INR: 1.02
Prothrombin Time: 13.3 seconds (ref 11.4–15.2)

## 2017-12-29 LAB — APTT: aPTT: 27 seconds (ref 24–36)

## 2017-12-29 NOTE — Progress Notes (Signed)
lov 11-25-17 Dr. Hoy Morn chart  cxr 09-12-17 chart

## 2018-01-04 MED ORDER — SODIUM CHLORIDE 0.9 % IV SOLN
1000.0000 mg | INTRAVENOUS | Status: DC
  Filled 2018-01-04: qty 10

## 2018-01-04 MED ORDER — BUPIVACAINE LIPOSOME 1.3 % IJ SUSP
20.0000 mL | Freq: Once | INTRAMUSCULAR | Status: DC
  Filled 2018-01-04: qty 20

## 2018-01-04 NOTE — Anesthesia Preprocedure Evaluation (Signed)
Anesthesia Evaluation  Patient identified by MRN, date of birth, ID band Patient awake    Reviewed: Allergy & Precautions, H&P , NPO status , Patient's Chart, lab work & pertinent test results  History of Anesthesia Complications (+) PONV, Family history of anesthesia reaction and history of anesthetic complications  Airway Mallampati: II  TM Distance: >3 FB Neck ROM: Full    Dental no notable dental hx. (+) Teeth Intact, Dental Advisory Given   Pulmonary asthma ,    Pulmonary exam normal breath sounds clear to auscultation       Cardiovascular hypertension, On Medications + Peripheral Vascular Disease   Rhythm:Regular Rate:Normal     Neuro/Psych  Headaches, Anxiety  Neuromuscular disease    GI/Hepatic GERD  Medicated,  Endo/Other    Renal/GU      Musculoskeletal  (+) Arthritis , Osteoarthritis,    Abdominal   Peds  Hematology   Anesthesia Other Findings   Reproductive/Obstetrics                             Anesthesia Physical  Anesthesia Plan  ASA: II  Anesthesia Plan: Spinal   Post-op Pain Management:  Regional for Post-op pain   Induction: Intravenous  PONV Risk Score and Plan: 2 and Treatment may vary due to age or medical condition  Airway Management Planned: Natural Airway, Mask and Nasal Cannula  Additional Equipment:   Intra-op Plan:   Post-operative Plan:   Informed Consent: I have reviewed the patients History and Physical, chart, labs and discussed the procedure including the risks, benefits and alternatives for the proposed anesthesia with the patient or authorized representative who has indicated his/her understanding and acceptance.   Dental advisory given  Plan Discussed with: CRNA, Anesthesiologist and Surgeon  Anesthesia Plan Comments: (  )        Anesthesia Quick Evaluation

## 2018-01-05 ENCOUNTER — Encounter (HOSPITAL_COMMUNITY): Payer: Self-pay | Admitting: Emergency Medicine

## 2018-01-05 ENCOUNTER — Inpatient Hospital Stay (HOSPITAL_COMMUNITY): Payer: Medicare HMO | Admitting: Anesthesiology

## 2018-01-05 ENCOUNTER — Encounter (HOSPITAL_COMMUNITY)
Admission: RE | Disposition: A | Discharge status: Skilled Nursing Facility | Payer: Self-pay | Source: Home / Self Care | Attending: Orthopedic Surgery

## 2018-01-05 ENCOUNTER — Other Ambulatory Visit: Payer: Self-pay

## 2018-01-05 ENCOUNTER — Inpatient Hospital Stay (HOSPITAL_COMMUNITY)
Admission: RE | Admit: 2018-01-05 | Discharge: 2018-01-09 | DRG: 470 | Disposition: A | Discharge status: Skilled Nursing Facility | Payer: Medicare HMO | Attending: Orthopedic Surgery | Admitting: Orthopedic Surgery

## 2018-01-05 DIAGNOSIS — K219 Gastro-esophageal reflux disease without esophagitis: Secondary | ICD-10-CM | POA: Diagnosis present

## 2018-01-05 DIAGNOSIS — M1711 Unilateral primary osteoarthritis, right knee: Principal | ICD-10-CM | POA: Diagnosis present

## 2018-01-05 DIAGNOSIS — H9190 Unspecified hearing loss, unspecified ear: Secondary | ICD-10-CM | POA: Diagnosis present

## 2018-01-05 DIAGNOSIS — Z882 Allergy status to sulfonamides status: Secondary | ICD-10-CM

## 2018-01-05 DIAGNOSIS — I10 Essential (primary) hypertension: Secondary | ICD-10-CM | POA: Diagnosis present

## 2018-01-05 DIAGNOSIS — Z791 Long term (current) use of non-steroidal anti-inflammatories (NSAID): Secondary | ICD-10-CM

## 2018-01-05 DIAGNOSIS — Z9049 Acquired absence of other specified parts of digestive tract: Secondary | ICD-10-CM

## 2018-01-05 DIAGNOSIS — Z885 Allergy status to narcotic agent status: Secondary | ICD-10-CM | POA: Diagnosis not present

## 2018-01-05 DIAGNOSIS — H409 Unspecified glaucoma: Secondary | ICD-10-CM | POA: Diagnosis present

## 2018-01-05 DIAGNOSIS — Z79899 Other long term (current) drug therapy: Secondary | ICD-10-CM | POA: Diagnosis not present

## 2018-01-05 DIAGNOSIS — Z9851 Tubal ligation status: Secondary | ICD-10-CM | POA: Diagnosis not present

## 2018-01-05 DIAGNOSIS — Z7951 Long term (current) use of inhaled steroids: Secondary | ICD-10-CM

## 2018-01-05 DIAGNOSIS — M1710 Unilateral primary osteoarthritis, unspecified knee: Secondary | ICD-10-CM

## 2018-01-05 DIAGNOSIS — I739 Peripheral vascular disease, unspecified: Secondary | ICD-10-CM | POA: Diagnosis present

## 2018-01-05 DIAGNOSIS — Z91048 Other nonmedicinal substance allergy status: Secondary | ICD-10-CM

## 2018-01-05 DIAGNOSIS — Z881 Allergy status to other antibiotic agents status: Secondary | ICD-10-CM | POA: Diagnosis not present

## 2018-01-05 DIAGNOSIS — M179 Osteoarthritis of knee, unspecified: Secondary | ICD-10-CM | POA: Diagnosis present

## 2018-01-05 DIAGNOSIS — Z90710 Acquired absence of both cervix and uterus: Secondary | ICD-10-CM

## 2018-01-05 DIAGNOSIS — Z8261 Family history of arthritis: Secondary | ICD-10-CM | POA: Diagnosis not present

## 2018-01-05 DIAGNOSIS — Z880 Allergy status to penicillin: Secondary | ICD-10-CM | POA: Diagnosis not present

## 2018-01-05 DIAGNOSIS — Z803 Family history of malignant neoplasm of breast: Secondary | ICD-10-CM | POA: Diagnosis not present

## 2018-01-05 DIAGNOSIS — Z888 Allergy status to other drugs, medicaments and biological substances status: Secondary | ICD-10-CM

## 2018-01-05 HISTORY — PX: TOTAL KNEE ARTHROPLASTY: SHX125

## 2018-01-05 LAB — TYPE AND SCREEN
ABO/RH(D): B POS
Antibody Screen: NEGATIVE

## 2018-01-05 LAB — GLUCOSE, CAPILLARY: GLUCOSE-CAPILLARY: 154 mg/dL — AB (ref 65–99)

## 2018-01-05 SURGERY — ARTHROPLASTY, KNEE, TOTAL
Anesthesia: Spinal | Site: Knee | Laterality: Right

## 2018-01-05 MED ORDER — SODIUM CHLORIDE 0.9 % IR SOLN
Status: DC | PRN
  Administered 2018-01-05: 1000 mL

## 2018-01-05 MED ORDER — FENTANYL CITRATE (PF) 100 MCG/2ML IJ SOLN
100.0000 ug | Freq: Once | INTRAMUSCULAR | Status: AC
  Administered 2018-01-05: 100 ug via INTRAVENOUS
  Filled 2018-01-05: qty 2

## 2018-01-05 MED ORDER — OXYCODONE HCL 5 MG PO TABS
5.0000 mg | ORAL_TABLET | ORAL | Status: DC | PRN
  Administered 2018-01-06 (×2): 5 mg via ORAL
  Filled 2018-01-05 (×3): qty 1

## 2018-01-05 MED ORDER — DEXAMETHASONE SODIUM PHOSPHATE 10 MG/ML IJ SOLN
8.0000 mg | Freq: Once | INTRAMUSCULAR | Status: AC
  Administered 2018-01-05: 8 mg via INTRAVENOUS

## 2018-01-05 MED ORDER — TELMISARTAN-HCTZ 80-25 MG PO TABS: 1.0000 | ORAL_TABLET | Freq: Every day | ORAL | Status: DC

## 2018-01-05 MED ORDER — METHOCARBAMOL 1000 MG/10ML IJ SOLN
500.0000 mg | Freq: Four times a day (QID) | INTRAVENOUS | Status: DC | PRN
  Administered 2018-01-05: 500 mg via INTRAVENOUS
  Filled 2018-01-05: qty 550

## 2018-01-05 MED ORDER — PROPOFOL 10 MG/ML IV BOLUS
INTRAVENOUS | Status: DC | PRN
  Administered 2018-01-05: 30 mg via INTRAVENOUS

## 2018-01-05 MED ORDER — DIPHENHYDRAMINE HCL 12.5 MG/5ML PO ELIX
12.5000 mg | ORAL_SOLUTION | ORAL | Status: DC | PRN
  Filled 2018-01-05: qty 10

## 2018-01-05 MED ORDER — ONDANSETRON HCL 4 MG/2ML IJ SOLN
INTRAMUSCULAR | Status: AC
  Filled 2018-01-05: qty 2

## 2018-01-05 MED ORDER — BUPIVACAINE LIPOSOME 1.3 % IJ SUSP
INTRAMUSCULAR | Status: DC | PRN
  Administered 2018-01-05: 20 mL

## 2018-01-05 MED ORDER — ALBUTEROL SULFATE (2.5 MG/3ML) 0.083% IN NEBU
2.5000 mg | INHALATION_SOLUTION | RESPIRATORY_TRACT | Status: DC | PRN
  Administered 2018-01-06: 2.5 mg via RESPIRATORY_TRACT
  Filled 2018-01-05 (×2): qty 3

## 2018-01-05 MED ORDER — ONDANSETRON HCL 4 MG PO TABS
4.0000 mg | ORAL_TABLET | Freq: Four times a day (QID) | ORAL | Status: DC | PRN
Start: 2018-01-05 — End: 2018-01-09

## 2018-01-05 MED ORDER — STERILE WATER FOR IRRIGATION IR SOLN
Status: DC | PRN
  Administered 2018-01-05: 2000 mL

## 2018-01-05 MED ORDER — DOCUSATE SODIUM 100 MG PO CAPS
100.0000 mg | ORAL_CAPSULE | Freq: Two times a day (BID) | ORAL | Status: DC
  Administered 2018-01-05 – 2018-01-09 (×7): 100 mg via ORAL
  Filled 2018-01-05 (×8): qty 1

## 2018-01-05 MED ORDER — TRANEXAMIC ACID 1000 MG/10ML IV SOLN
1000.0000 mg | Freq: Once | INTRAVENOUS | Status: AC
  Administered 2018-01-05: 1000 mg via INTRAVENOUS
  Filled 2018-01-05: qty 1100

## 2018-01-05 MED ORDER — OXYCODONE HCL 5 MG PO TABS: 10.0000 mg | ORAL_TABLET | ORAL | Status: DC | PRN

## 2018-01-05 MED ORDER — MOMETASONE FURO-FORMOTEROL FUM 100-5 MCG/ACT IN AERO: 2.0000 | INHALATION_SPRAY | Freq: Two times a day (BID) | RESPIRATORY_TRACT | Status: DC

## 2018-01-05 MED ORDER — TRAMADOL HCL 50 MG PO TABS
50.0000 mg | ORAL_TABLET | Freq: Four times a day (QID) | ORAL | Status: DC | PRN
  Administered 2018-01-05 – 2018-01-07 (×3): 100 mg via ORAL
  Administered 2018-01-09: 50 mg via ORAL
  Filled 2018-01-05: qty 1
  Filled 2018-01-05 (×4): qty 2

## 2018-01-05 MED ORDER — MONTELUKAST SODIUM 10 MG PO TABS
10.0000 mg | ORAL_TABLET | Freq: Every day | ORAL | Status: DC
  Administered 2018-01-05 – 2018-01-08 (×4): 10 mg via ORAL
  Filled 2018-01-05 (×4): qty 1

## 2018-01-05 MED ORDER — FENTANYL CITRATE (PF) 100 MCG/2ML IJ SOLN: 25.0000 ug | INTRAMUSCULAR | Status: DC | PRN

## 2018-01-05 MED ORDER — PHENYLEPHRINE HCL 10 MG/ML IJ SOLN
INTRAVENOUS | Status: DC | PRN
  Administered 2018-01-05: 40 ug/min via INTRAVENOUS

## 2018-01-05 MED ORDER — METOCLOPRAMIDE HCL 5 MG/ML IJ SOLN
5.0000 mg | Freq: Three times a day (TID) | INTRAMUSCULAR | Status: DC | PRN
  Administered 2018-01-06 – 2018-01-08 (×2): 10 mg via INTRAVENOUS
  Filled 2018-01-05 (×2): qty 2

## 2018-01-05 MED ORDER — LACTATED RINGERS IV SOLN
INTRAVENOUS | Status: DC
  Administered 2018-01-05 (×2): via INTRAVENOUS

## 2018-01-05 MED ORDER — BISACODYL 10 MG RE SUPP: 10.0000 mg | Freq: Every day | RECTAL | Status: DC | PRN

## 2018-01-05 MED ORDER — DEXAMETHASONE SODIUM PHOSPHATE 10 MG/ML IJ SOLN
10.0000 mg | Freq: Once | INTRAMUSCULAR | Status: AC
  Administered 2018-01-06: 10 mg via INTRAVENOUS
  Filled 2018-01-05: qty 1

## 2018-01-05 MED ORDER — PHENYLEPHRINE HCL 10 MG/ML IJ SOLN
INTRAMUSCULAR | Status: AC
  Filled 2018-01-05: qty 1

## 2018-01-05 MED ORDER — ACETAMINOPHEN 500 MG PO TABS
1000.0000 mg | ORAL_TABLET | Freq: Four times a day (QID) | ORAL | Status: AC
  Administered 2018-01-05 – 2018-01-06 (×4): 1000 mg via ORAL
  Filled 2018-01-05 (×4): qty 2

## 2018-01-05 MED ORDER — DEXAMETHASONE SODIUM PHOSPHATE 10 MG/ML IJ SOLN
INTRAMUSCULAR | Status: AC
  Filled 2018-01-05: qty 1

## 2018-01-05 MED ORDER — AMLODIPINE BESYLATE 5 MG PO TABS
5.0000 mg | ORAL_TABLET | Freq: Every evening | ORAL | Status: DC
  Administered 2018-01-05 – 2018-01-08 (×4): 5 mg via ORAL
  Filled 2018-01-05 (×4): qty 1

## 2018-01-05 MED ORDER — ONDANSETRON HCL 4 MG/2ML IJ SOLN
4.0000 mg | Freq: Four times a day (QID) | INTRAMUSCULAR | Status: DC | PRN
  Administered 2018-01-06: 4 mg via INTRAVENOUS
  Filled 2018-01-05 (×2): qty 2

## 2018-01-05 MED ORDER — VANCOMYCIN HCL IN DEXTROSE 1-5 GM/200ML-% IV SOLN
1000.0000 mg | Freq: Two times a day (BID) | INTRAVENOUS | Status: AC
  Administered 2018-01-05: 1000 mg via INTRAVENOUS
  Filled 2018-01-05: qty 200

## 2018-01-05 MED ORDER — MEPERIDINE HCL 50 MG/ML IJ SOLN: 6.2500 mg | INTRAMUSCULAR | Status: DC | PRN

## 2018-01-05 MED ORDER — ROPIVACAINE HCL 7.5 MG/ML IJ SOLN
INTRAMUSCULAR | Status: DC | PRN
  Administered 2018-01-05: 25 mL via PERINEURAL

## 2018-01-05 MED ORDER — METHOCARBAMOL 500 MG PO TABS
500.0000 mg | ORAL_TABLET | Freq: Four times a day (QID) | ORAL | Status: DC | PRN
  Administered 2018-01-06 – 2018-01-08 (×4): 500 mg via ORAL
  Filled 2018-01-05 (×4): qty 1

## 2018-01-05 MED ORDER — BUPIVACAINE IN DEXTROSE 0.75-8.25 % IT SOLN
INTRATHECAL | Status: DC | PRN
  Administered 2018-01-05: 1.5 mL via INTRATHECAL

## 2018-01-05 MED ORDER — ONDANSETRON HCL 4 MG/2ML IJ SOLN
INTRAMUSCULAR | Status: DC | PRN
  Administered 2018-01-05: 4 mg via INTRAVENOUS

## 2018-01-05 MED ORDER — SODIUM CHLORIDE 0.9 % IJ SOLN
INTRAMUSCULAR | Status: AC
  Filled 2018-01-05: qty 50

## 2018-01-05 MED ORDER — IRBESARTAN 150 MG PO TABS
300.0000 mg | ORAL_TABLET | Freq: Every day | ORAL | Status: DC
  Administered 2018-01-05 – 2018-01-09 (×5): 300 mg via ORAL
  Filled 2018-01-05 (×5): qty 2

## 2018-01-05 MED ORDER — MOMETASONE FURO-FORMOTEROL FUM 200-5 MCG/ACT IN AERO: 2.0000 | INHALATION_SPRAY | Freq: Two times a day (BID) | RESPIRATORY_TRACT | Status: DC

## 2018-01-05 MED ORDER — PROPOFOL 10 MG/ML IV BOLUS
INTRAVENOUS | Status: AC
  Filled 2018-01-05: qty 60

## 2018-01-05 MED ORDER — VANCOMYCIN HCL IN DEXTROSE 1-5 GM/200ML-% IV SOLN
1000.0000 mg | INTRAVENOUS | Status: AC
  Administered 2018-01-05: 1000 mg via INTRAVENOUS
  Filled 2018-01-05: qty 200

## 2018-01-05 MED ORDER — SODIUM CHLORIDE 0.9 % IV SOLN
INTRAVENOUS | Status: DC
  Administered 2018-01-05 – 2018-01-06 (×2): via INTRAVENOUS

## 2018-01-05 MED ORDER — SODIUM CHLORIDE 0.9 % IJ SOLN
INTRAMUSCULAR | Status: DC | PRN
  Administered 2018-01-05: 60 mL

## 2018-01-05 MED ORDER — PHENYLEPHRINE HCL 10 MG/ML IJ SOLN: 30.0000 ug/min | INTRAVENOUS | Status: DC

## 2018-01-05 MED ORDER — FLEET ENEMA 7-19 GM/118ML RE ENEM: 1.0000 | ENEMA | Freq: Once | RECTAL | Status: DC | PRN

## 2018-01-05 MED ORDER — PROPOFOL 500 MG/50ML IV EMUL
INTRAVENOUS | Status: DC | PRN
  Administered 2018-01-05: 40 ug/kg/min via INTRAVENOUS

## 2018-01-05 MED ORDER — LATANOPROST 0.005 % OP SOLN
1.0000 [drp] | Freq: Every day | OPHTHALMIC | Status: DC
  Administered 2018-01-05 – 2018-01-08 (×2): 1 [drp] via OPHTHALMIC
  Filled 2018-01-05: qty 2.5

## 2018-01-05 MED ORDER — ASPIRIN EC 325 MG PO TBEC
325.0000 mg | DELAYED_RELEASE_TABLET | Freq: Two times a day (BID) | ORAL | Status: DC
  Administered 2018-01-06 – 2018-01-09 (×7): 325 mg via ORAL
  Filled 2018-01-05 (×7): qty 1

## 2018-01-05 MED ORDER — SODIUM CHLORIDE 0.9 % IJ SOLN
INTRAMUSCULAR | Status: AC
  Filled 2018-01-05: qty 10

## 2018-01-05 MED ORDER — METOCLOPRAMIDE HCL 5 MG PO TABS: 5.0000 mg | ORAL_TABLET | Freq: Three times a day (TID) | ORAL | Status: DC | PRN

## 2018-01-05 MED ORDER — PHENOL 1.4 % MT LIQD: 1.0000 | OROMUCOSAL | Status: DC | PRN

## 2018-01-05 MED ORDER — AZELASTINE HCL 0.1 % NA SOLN
1.0000 | Freq: Two times a day (BID) | NASAL | Status: DC
  Administered 2018-01-05 – 2018-01-09 (×6): 1 via NASAL
  Filled 2018-01-05: qty 30

## 2018-01-05 MED ORDER — MENTHOL 3 MG MT LOZG: 1.0000 | LOZENGE | OROMUCOSAL | Status: DC | PRN

## 2018-01-05 MED ORDER — CHLORHEXIDINE GLUCONATE 4 % EX LIQD: 60.0000 mL | Freq: Once | CUTANEOUS | Status: DC

## 2018-01-05 MED ORDER — HYDROCHLOROTHIAZIDE 25 MG PO TABS
25.0000 mg | ORAL_TABLET | Freq: Every day | ORAL | Status: DC
Start: 2018-01-05 — End: 2018-01-09
  Administered 2018-01-05 – 2018-01-09 (×5): 25 mg via ORAL
  Filled 2018-01-05 (×5): qty 1

## 2018-01-05 MED ORDER — POLYETHYLENE GLYCOL 3350 17 G PO PACK
17.0000 g | PACK | Freq: Every day | ORAL | Status: DC | PRN
Start: 2018-01-05 — End: 2018-01-09
  Administered 2018-01-07 – 2018-01-08 (×2): 17 g via ORAL
  Filled 2018-01-05 (×2): qty 1

## 2018-01-05 MED ORDER — MIDAZOLAM HCL 2 MG/2ML IJ SOLN
2.0000 mg | Freq: Once | INTRAMUSCULAR | Status: DC
  Filled 2018-01-05: qty 2

## 2018-01-05 MED ORDER — FLUTICASONE PROPIONATE 50 MCG/ACT NA SUSP
1.0000 | Freq: Two times a day (BID) | NASAL | Status: DC | PRN
  Filled 2018-01-05: qty 16

## 2018-01-05 SURGICAL SUPPLY — 56 items
BAG DECANTER FOR FLEXI CONT (MISCELLANEOUS) IMPLANT
BAG ZIPLOCK 12X15 (MISCELLANEOUS) ×3 IMPLANT
BANDAGE ACE 6X5 VEL STRL LF (GAUZE/BANDAGES/DRESSINGS) ×3 IMPLANT
BLADE SAG 18X100X1.27 (BLADE) ×3 IMPLANT
BLADE SAW SGTL 11.0X1.19X90.0M (BLADE) ×3 IMPLANT
BOWL SMART MIX CTS (DISPOSABLE) ×3 IMPLANT
CAP KNEE TOTAL 3 SIGMA ×3 IMPLANT
CEMENT HV SMART SET (Cement) ×6 IMPLANT
CLOSURE WOUND 1/2 X4 (GAUZE/BANDAGES/DRESSINGS) ×1
COVER SURGICAL LIGHT HANDLE (MISCELLANEOUS) ×3 IMPLANT
CUFF TOURN SGL QUICK 34 (TOURNIQUET CUFF) ×2
CUFF TRNQT CYL 34X4X40X1 (TOURNIQUET CUFF) ×1 IMPLANT
DECANTER SPIKE VIAL GLASS SM (MISCELLANEOUS) ×6 IMPLANT
DRAPE U-SHAPE 47X51 STRL (DRAPES) ×3 IMPLANT
DRSG ADAPTIC 3X8 NADH LF (GAUZE/BANDAGES/DRESSINGS) ×3 IMPLANT
DRSG PAD ABDOMINAL 8X10 ST (GAUZE/BANDAGES/DRESSINGS) ×3 IMPLANT
DURAPREP 26ML APPLICATOR (WOUND CARE) ×3 IMPLANT
ELECT REM PT RETURN 15FT ADLT (MISCELLANEOUS) ×3 IMPLANT
EVACUATOR 1/8 PVC DRAIN (DRAIN) ×3 IMPLANT
GAUZE SPONGE 4X4 12PLY STRL (GAUZE/BANDAGES/DRESSINGS) ×3 IMPLANT
GLOVE BIO SURGEON STRL SZ 6.5 (GLOVE) IMPLANT
GLOVE BIO SURGEON STRL SZ8 (GLOVE) ×6 IMPLANT
GLOVE BIO SURGEONS STRL SZ 6.5 (GLOVE)
GLOVE BIOGEL PI IND STRL 6.5 (GLOVE) IMPLANT
GLOVE BIOGEL PI IND STRL 7.0 (GLOVE) ×4 IMPLANT
GLOVE BIOGEL PI IND STRL 8 (GLOVE) ×2 IMPLANT
GLOVE BIOGEL PI INDICATOR 6.5 (GLOVE)
GLOVE BIOGEL PI INDICATOR 7.0 (GLOVE) ×8
GLOVE BIOGEL PI INDICATOR 8 (GLOVE) ×4
GLOVE ECLIPSE 7.0 STRL STRAW (GLOVE) ×3 IMPLANT
GLOVE SURG ORTHO 8.0 STRL STRW (GLOVE) ×3 IMPLANT
GOWN SPEC L4 XLG W/TWL (GOWN DISPOSABLE) ×6 IMPLANT
GOWN STRL REUS W/TWL LRG LVL3 (GOWN DISPOSABLE) ×6 IMPLANT
HANDPIECE INTERPULSE COAX TIP (DISPOSABLE) ×2
HOLDER FOLEY CATH W/STRAP (MISCELLANEOUS) IMPLANT
IMMOBILIZER KNEE 20 (SOFTGOODS) ×3
IMMOBILIZER KNEE 20 THIGH 36 (SOFTGOODS) ×1 IMPLANT
MANIFOLD NEPTUNE II (INSTRUMENTS) ×3 IMPLANT
NS IRRIG 1000ML POUR BTL (IV SOLUTION) ×3 IMPLANT
PACK TOTAL KNEE CUSTOM (KITS) ×3 IMPLANT
PAD ABD 8X10 STRL (GAUZE/BANDAGES/DRESSINGS) ×3 IMPLANT
PADDING CAST COTTON 6X4 STRL (CAST SUPPLIES) ×3 IMPLANT
POSITIONER SURGICAL ARM (MISCELLANEOUS) ×3 IMPLANT
SET HNDPC FAN SPRY TIP SCT (DISPOSABLE) ×1 IMPLANT
STRIP CLOSURE SKIN 1/2X4 (GAUZE/BANDAGES/DRESSINGS) ×2 IMPLANT
SUT MNCRL AB 4-0 PS2 18 (SUTURE) ×3 IMPLANT
SUT STRATAFIX 0 PDS 27 VIOLET (SUTURE) ×3
SUT VIC AB 2-0 CT1 27 (SUTURE) ×6
SUT VIC AB 2-0 CT1 TAPERPNT 27 (SUTURE) ×3 IMPLANT
SUTURE STRATFX 0 PDS 27 VIOLET (SUTURE) ×1 IMPLANT
SYR 50ML LL SCALE MARK (SYRINGE) IMPLANT
TRAY FOLEY CATH 14FRSI W/METER (CATHETERS) ×3 IMPLANT
TRAY FOLEY MTR SLVR 16FR STAT (SET/KITS/TRAYS/PACK) IMPLANT
WATER STERILE IRR 1000ML POUR (IV SOLUTION) ×6 IMPLANT
WRAP KNEE MAXI GEL POST OP (GAUZE/BANDAGES/DRESSINGS) ×3 IMPLANT
YANKAUER SUCT BULB TIP 10FT TU (MISCELLANEOUS) ×3 IMPLANT

## 2018-01-05 NOTE — Transfer of Care (Signed)
Immediate Anesthesia Transfer of Care Note  Patient: Cassandra Sims  Procedure(s) Performed: Procedure(s): RIGHT TOTAL KNEE ARTHROPLASTY (Right)  Patient Location: PACU  Anesthesia Type:Regional and Spinal  Level of Consciousness:  sedated, patient cooperative and responds to stimulation  Airway & Oxygen Therapy:Patient Spontanous Breathing and Patient connected to face mask oxgen  Post-op Assessment:  Report given to PACU RN and Post -op Vital signs reviewed and stable  Post vital signs:  Reviewed and stable  Last Vitals:  Vitals:   01/05/18 0902 01/05/18 0907  BP: (!) 149/80 138/74  Pulse: 82 80  Resp: 17 20  Temp:    SpO2: 47% 09%    Complications: No apparent anesthesia complications

## 2018-01-05 NOTE — Discharge Instructions (Signed)
° °Dr. Frank Aluisio °Total Joint Specialist °[facility] °[address] °[place], [place] [address] °[phone] ° °TOTAL KNEE REPLACEMENT POSTOPERATIVE DIRECTIONS ° °Knee Rehabilitation, Guidelines Following Surgery  °Results after knee surgery are often greatly improved when you follow the exercise, range of motion and muscle strengthening exercises prescribed by your doctor. Safety measures are also important to protect the knee from further injury. Any time any of these exercises cause you to have increased pain or swelling in your knee joint, decrease the amount until you are comfortable again and slowly increase them. If you have problems or questions, call your caregiver or physical therapist for advice.  ° °HOME CARE INSTRUCTIONS  °• Remove items at home which could result in a fall. This includes throw rugs or furniture in walking pathways.  °· ICE to the affected knee every three hours for 30 minutes at a time and then as needed for pain and swelling.  Continue to use ice on the knee for pain and swelling from surgery. You may notice swelling that will progress down to the foot and ankle.  This is normal after surgery.  Elevate the leg when you are not up walking on it.   °· Continue to use the breathing machine which will help keep your temperature down.  It is common for your temperature to cycle up and down following surgery, especially at night when you are not up moving around and exerting yourself.  The breathing machine keeps your lungs expanded and your temperature down. °· Do not place pillow under knee, focus on keeping the knee straight while resting ° °DIET °You may resume your previous home diet once your are discharged from the hospital. ° °DRESSING / WOUND CARE / SHOWERING °You may shower 3 days after surgery, but keep the wounds dry during showering.  You may use an occlusive plastic wrap (Press'n Seal for example), NO SOAKING/SUBMERGING IN THE BATHTUB.  If the bandage  gets wet, change with a clean dry gauze.  If the incision gets wet, pat the wound dry with a clean towel. °You may start showering once you are discharged home but do not submerge the incision under water. Just pat the incision dry and apply a dry gauze dressing on daily. °Change the surgical dressing daily and reapply a dry dressing each time. ° °ACTIVITY °Walk with your walker as instructed. °Use walker as long as suggested by your caregivers. °Avoid periods of inactivity such as sitting longer than an hour when not asleep. This helps prevent blood clots.  °You may resume a sexual relationship in one month or when given the OK by your doctor.  °You may return to work once you are cleared by your doctor.  °Do not drive a car for 6 weeks or until released by you surgeon.  °Do not drive while taking narcotics. ° °WEIGHT BEARING °Weight bearing as tolerated with assist device (walker, cane, etc) as directed, use it as long as suggested by your surgeon or therapist, typically at least 4-6 weeks. ° °POSTOPERATIVE CONSTIPATION PROTOCOL °Constipation - defined medically as fewer than three stools per week and severe constipation as less than one stool per week. ° °One of the most common issues patients have following surgery is constipation.  Even if you have a regular bowel pattern at home, your normal regimen is likely to be disrupted due to multiple reasons following surgery.  Combination of anesthesia, postoperative narcotics, change in appetite and fluid intake all can affect your bowels.    In order to avoid complications following surgery, here are some recommendations in order to help you during your recovery period. ° °Colace (docusate) - Pick up an over-the-counter form of Colace or another stool softener and take twice a day as long as you are requiring postoperative pain medications.  Take with a full glass of water daily.  If you experience loose stools or diarrhea, hold the colace until you stool forms back  up.  If your symptoms do not get better within 1 week or if they get worse, check with your doctor. ° °Dulcolax (bisacodyl) - Pick up over-the-counter and take as directed by the product packaging as needed to assist with the movement of your bowels.  Take with a full glass of water.  Use this product as needed if not relieved by Colace only.  ° °MiraLax (polyethylene glycol) - Pick up over-the-counter to have on hand.  MiraLax is a solution that will increase the amount of water in your bowels to assist with bowel movements.  Take as directed and can mix with a glass of water, juice, soda, coffee, or tea.  Take if you go more than two days without a movement. °Do not use MiraLax more than once per day. Call your doctor if you are still constipated or irregular after using this medication for 7 days in a row. ° °If you continue to have problems with postoperative constipation, please contact the office for further assistance and recommendations.  If you experience "the worst abdominal pain ever" or develop nausea or vomiting, please contact the office immediatly for further recommendations for treatment. ° °ITCHING ° If you experience itching with your medications, try taking only a single pain pill, or even half a pain pill at a time.  You can also use Benadryl over the counter for itching or also to help with sleep.  ° °TED HOSE STOCKINGS °Wear the elastic stockings on both legs for three weeks following surgery during the day but you may remove then at night for sleeping. ° °MEDICATIONS °See your medication summary on the “After Visit Summary” that the nursing staff will review with you prior to discharge.  You may have some home medications which will be placed on hold until you complete the course of blood thinner medication.  It is important for you to complete the blood thinner medication as prescribed by your surgeon.  Continue your approved medications as instructed at time of discharge. ° °PRECAUTIONS °If  you experience chest pain or shortness of breath - call 911 immediately for transfer to the hospital emergency department.  °If you develop a fever greater that 101 F, purulent drainage from wound, increased redness or drainage from wound, foul odor from the wound/dressing, or calf pain - CONTACT YOUR SURGEON.   °                                                °FOLLOW-UP APPOINTMENTS °Make sure you keep all of your appointments after your operation with your surgeon and caregivers. You should call the office at the above phone number and make an appointment for approximately two weeks after the date of your surgery or on the date instructed by your surgeon outlined in the "After Visit Summary". ° ° °RANGE OF MOTION AND STRENGTHENING EXERCISES  °Rehabilitation of the knee is important following a knee injury or   an operation. After just a few days of immobilization, the muscles of the thigh which control the knee become weakened and shrink (atrophy). Knee exercises are designed to build up the tone and strength of the thigh muscles and to improve knee motion. Often times heat used for twenty to thirty minutes before working out will loosen up your tissues and help with improving the range of motion but do not use heat for the first two weeks following surgery. These exercises can be done on a training (exercise) mat, on the floor, on a table or on a bed. Use what ever works the best and is most comfortable for you Knee exercises include:  °• Leg Lifts - While your knee is still immobilized in a splint or cast, you can do straight leg raises. Lift the leg to 60 degrees, hold for 3 sec, and slowly lower the leg. Repeat 10-20 times 2-3 times daily. Perform this exercise against resistance later as your knee gets better.  °• Quad and Hamstring Sets - Tighten up the muscle on the front of the thigh (Quad) and hold for 5-10 sec. Repeat this 10-20 times hourly. Hamstring sets are done by pushing the foot backward against an  object and holding for 5-10 sec. Repeat as with quad sets.  °· Leg Slides: Lying on your back, slowly slide your foot toward your buttocks, bending your knee up off the floor (only go as far as is comfortable). Then slowly slide your foot back down until your leg is flat on the floor again. °· Angel Wings: Lying on your back spread your legs to the side as far apart as you can without causing discomfort.  °A rehabilitation program following serious knee injuries can speed recovery and prevent re-injury in the future due to weakened muscles. Contact your doctor or a physical therapist for more information on knee rehabilitation.  ° °IF YOU ARE TRANSFERRED TO A SKILLED REHAB FACILITY °If the patient is transferred to a skilled rehab facility following release from the hospital, a list of the current medications will be sent to the facility for the patient to continue.  When discharged from the skilled rehab facility, please have the facility set up the patient's Home Health Physical Therapy prior to being released. Also, the skilled facility will be responsible for providing the patient with their medications at time of release from the facility to include their pain medication, the muscle relaxants, and their blood thinner medication. If the patient is still at the rehab facility at time of the two week follow up appointment, the skilled rehab facility will also need to assist the patient in arranging follow up appointment in our office and any transportation needs. ° °MAKE SURE YOU:  °• Understand these instructions.  °• Get help right away if you are not doing well or get worse.  ° ° °Pick up stool softner and laxative for home use following surgery while on pain medications. °Do not submerge incision under water. °Please use good hand washing techniques while changing dressing each day. °May shower starting three days after surgery. °Please use a clean towel to pat the incision dry following showers. °Continue to  use ice for pain and swelling after surgery. °Do not use any lotions or creams on the incision until instructed by your surgeon. ° °

## 2018-01-05 NOTE — Interval H&P Note (Signed)
History and Physical Interval Note:  01/05/2018 8:18 AM  Cassandra Sims  has presented today for surgery, with the diagnosis of Osteoarthritis Right Kne  The various methods of treatment have been discussed with the patient and family. After consideration of risks, benefits and other options for treatment, the patient has consented to  Procedure(s): RIGHT TOTAL KNEE ARTHROPLASTY (Right) as a surgical intervention .  The patient's history has been reviewed, patient examined, no change in status, stable for surgery.  I have reviewed the patient's chart and labs.  Questions were answered to the patient's satisfaction.     Pilar Plate Atsushi Yom

## 2018-01-05 NOTE — Anesthesia Postprocedure Evaluation (Signed)
Anesthesia Post Note  Patient: JOLANA RUNKLES  Procedure(s) Performed: RIGHT TOTAL KNEE ARTHROPLASTY (Right Knee)     Patient location during evaluation: PACU Anesthesia Type: Spinal Level of consciousness: oriented and awake and alert Pain management: pain level controlled Vital Signs Assessment: post-procedure vital signs reviewed and stable Respiratory status: spontaneous breathing, respiratory function stable and patient connected to nasal cannula oxygen Cardiovascular status: blood pressure returned to baseline and stable Postop Assessment: no headache, no backache and no apparent nausea or vomiting Anesthetic complications: no    Last Vitals:  Vitals:   01/05/18 1045 01/05/18 1100  BP: 103/61 131/73  Pulse: 64 64  Resp: 16 16  Temp: 36.6 C   SpO2: 100% 100%    Last Pain:  Vitals:   01/05/18 1100  TempSrc:   PainSc: Asleep                 Antasia Haider

## 2018-01-05 NOTE — Progress Notes (Signed)
Dr. Ambrose Pancoast notified that pt had sore throat last week, states she is doing better since. Also informed him that she has been lightheaded.

## 2018-01-05 NOTE — Progress Notes (Signed)
AssistedDr. Oddono with right, ultrasound guided, adductor canal block. Side rails up, monitors on throughout procedure. See vital signs in flow sheet. Tolerated Procedure well.  

## 2018-01-05 NOTE — Anesthesia Procedure Notes (Signed)
Anesthesia Regional Block: Adductor canal block   Pre-Anesthetic Checklist: ,, timeout performed, Correct Patient, Correct Site, Correct Laterality, Correct Procedure, Correct Position, site marked, Risks and benefits discussed,  Surgical consent,  Pre-op evaluation,  At surgeon's request and post-op pain management  Laterality: Right  Prep: chloraprep       Needles:  Injection technique: Single-shot  Needle Type: Echogenic Stimulator Needle     Needle Length: 5cm  Needle Gauge: 22     Additional Needles:   Procedures:, nerve stimulator,,, ultrasound used (permanent image in chart),,,,   Nerve Stimulator or Paresthesia:  Response: quadraceps contraction, 0.45 mA,   Additional Responses:   Narrative:  Start time: 01/05/2018 8:40 AM End time: 01/05/2018 8:45 AM Injection made incrementally with aspirations every 5 mL.  Performed by: Personally  Anesthesiologist: Duane Boston, MD  Additional Notes: Functioning IV was confirmed and monitors were applied.  A 61mm 22ga Arrow echogenic stimulator needle was used. Sterile prep and drape,hand hygiene and sterile gloves were used. Ultrasound guidance: relevant anatomy identified, needle position confirmed, local anesthetic spread visualized around nerve(s)., vascular puncture avoided.  Image printed for medical record. Negative aspiration and negative test dose prior to incremental administration of local anesthetic. The patient tolerated the procedure well.

## 2018-01-05 NOTE — Op Note (Signed)
OPERATIVE REPORT-TOTAL KNEE ARTHROPLASTY   Pre-operative diagnosis- Osteoarthritis  Right knee(s)  Post-operative diagnosis- Osteoarthritis Right knee(s)  Procedure-  Right  Total Knee Arthroplasty  Surgeon- Dione Plover. Xela Oregel, MD  Assistant- Molli Barrows, PA-C   Anesthesia-  Adductor canal block and spinal  EBL- 25 ml   Drains Hemovac  Tourniquet time-  Total Tourniquet Time Documented: Thigh (Right) - 29 minutes Total: Thigh (Right) - 29 minutes     Complications- None  Condition-PACU - hemodynamically stable.   Brief Clinical Note  Cassandra Sims is a 84 y.o. year old female with end stage OA of her right knee with progressively worsening pain and dysfunction. She has constant pain, with activity and at rest and significant functional deficits with difficulties even with ADLs. She has had extensive non-op management including analgesics, injections of cortisone and viscosupplements, and home exercise program, but remains in significant pain with significant dysfunction.Radiographs show bone on bone arthritis lateral and patellofemoral. She presents now for right Total Knee Arthroplasty.    Procedure in detail---   The patient is brought into the operating room and positioned supine on the operating table. After successful administration of  Adductor canal block and spinal,   a tourniquet is placed high on the  Right thigh(s) and the lower extremity is prepped and draped in the usual sterile fashion. Time out is performed by the operating team and then the  Right lower extremity is wrapped in Esmarch, knee flexed and the tourniquet inflated to 300 mmHg.       A midline incision is made with a ten blade through the subcutaneous tissue to the level of the extensor mechanism. A fresh blade is used to make a medial parapatellar arthrotomy. Soft tissue over the proximal medial tibia is subperiosteally elevated to the joint line with a knife and into the semimembranosus bursa with a  Cobb elevator. Soft tissue over the proximal lateral tibia is elevated with attention being paid to avoiding the patellar tendon on the tibial tubercle. The patella is everted, knee flexed 90 degrees and the ACL and PCL are removed. Findings are bone on bone lateral and patellofemoral with large global osteophytes.        The drill is used to create a starting hole in the distal femur and the canal is thoroughly irrigated with sterile saline to remove the fatty contents. The 5 degree Right  valgus alignment guide is placed into the femoral canal and the distal femoral cutting block is pinned to remove 10 mm off the distal femur. Resection is made with an oscillating saw.      The tibia is subluxed forward and the menisci are removed. The extramedullary alignment guide is placed referencing proximally at the medial aspect of the tibial tubercle and distally along the second metatarsal axis and tibial crest. The block is pinned to remove 68mm off the more deficient lateral  side. Resection is made with an oscillating saw. Size 2is the most appropriate size for the tibia and the proximal tibia is prepared with the modular drill and keel punch for that size.      The femoral sizing guide is placed and size 2 is most appropriate. Rotation is marked off the epicondylar axis and confirmed by creating a rectangular flexion gap at 90 degrees. The size 2 cutting block is pinned in this rotation and the anterior, posterior and chamfer cuts are made with the oscillating saw. The intercondylar block is then placed and that cut is made.  Trial size 2 tibial component, trial size 2 posterior stabilized femur and a 15  mm posterior stabilized rotating platform insert trial is placed. Full extension is achieved with excellent varus/valgus and anterior/posterior balance throughout full range of motion. The patella is everted and thickness measured to be 21  mm. Free hand resection is taken to 12 mm, a 35 template is placed,  lug holes are drilled, trial patella is placed, and it tracks normally. Osteophytes are removed off the posterior femur with the trial in place. All trials are removed and the cut bone surfaces prepared with pulsatile lavage. Cement is mixed and once ready for implantation, the size 2 tibial implant, size  2 posterior stabilized femoral component, and the size 35 patella are cemented in place and the patella is held with the clamp. The trial insert is placed and the knee held in full extension. The Exparel (20 ml mixed with 60 ml saline) is injected into the extensor mechanism, posterior capsule, medial and lateral gutters and subcutaneous tissues.  All extruded cement is removed and once the cement is hard the permanent 15 mm posterior stabilized rotating platform insert is placed into the tibial tray.      The wound is copiously irrigated with saline solution and the extensor mechanism closed over a hemovac drain with #1 V-loc suture. The tourniquet is released for a total tourniquet time of 28  minutes. Flexion against gravity is 135 degrees and the patella tracks normally. Subcutaneous tissue is closed with 2.0 vicryl and subcuticular with running 4.0 Monocryl. The incision is cleaned and dried and steri-strips and a bulky sterile dressing are applied. The limb is placed into a knee immobilizer and the patient is awakened and transported to recovery in stable condition.      Please note that a surgical assistant was a medical necessity for this procedure in order to perform it in a safe and expeditious manner. Surgical assistant was necessary to retract the ligaments and vital neurovascular structures to prevent injury to them and also necessary for proper positioning of the limb to allow for anatomic placement of the prosthesis.   Dione Plover Annessa Satre, MD    01/05/2018, 10:19 AM

## 2018-01-05 NOTE — Anesthesia Procedure Notes (Signed)
Spinal  Patient location during procedure: OR Start time: 01/05/2018 9:24 AM End time: 01/05/2018 9:30 AM Reason for block: at surgeon's request Staffing Resident/CRNA: Anne Fu, CRNA Performed: resident/CRNA  Preanesthetic Checklist Completed: patient identified, site marked, surgical consent, pre-op evaluation, timeout performed, IV checked, risks and benefits discussed and monitors and equipment checked Spinal Block Patient position: sitting Prep: DuraPrep Patient monitoring: heart rate, continuous pulse ox and blood pressure Approach: right paramedian Location: L2-3 Injection technique: single-shot Needle Needle type: Pencan  Needle gauge: 24 G Needle length: 9 cm Assessment Sensory level: T6 Additional Notes Expiration date of kit checked and confirmed. Patient tolerated procedure well, without complications. X 1 attempt with noted clear CSF return. Loss of motor and sensory on exam post injection.

## 2018-01-05 NOTE — Progress Notes (Signed)
PT Cancellation Note  Patient Details Name: Cassandra Sims MRN: [id number] DOB: Feb 06, 1933   Cancelled Treatment:    Reason Eval/Treat Not Completed: Other (comment)(spinal has not worn off yet, will follow. )   Philomena Doheny 01/05/2018, 2:21 PM [phone]

## 2018-01-05 NOTE — Evaluation (Signed)
Physical Therapy Evaluation Patient Details Name: Cassandra Sims MRN: [id number] DOB: 01-Dec-1932 Today's Date: 01/05/2018   History of Present Illness  R TKA  Clinical Impression  The  Patient reports that the right  Foot feels numb, noted decreased dorsiflexion on the right, left is WNL.    Follow Up Recommendations SNF    Equipment Recommendations  None recommended by PT    Recommendations for Other Services       Precautions / Restrictions Precautions Precautions: Knee;Fall Required Braces or Orthoses: Knee Immobilizer - Right Knee Immobilizer - Right: Discontinue once straight leg raise with < 10 degree lag      Mobility  Bed Mobility Overal bed mobility: Needs Assistance Bed Mobility: Supine to Sit;Sit to Supine     Supine to sit: Min assist Sit to supine: Min assist   General bed mobility comments: assist with the right leg  Transfers Overall transfer level: Needs assistance Equipment used: Rolling walker (2 wheeled) Transfers: Sit to/from Stand Sit to Stand: Min assist;+2 safety/equipment         General transfer comment: [facility], took 3 side steps, reports right foot numb. Assisted back into bed  Ambulation/Gait                Stairs            Wheelchair Mobility    Modified Rankin (Stroke Patients Only)       Balance                                             Pertinent Vitals/Pain Pain Assessment: 0-10 Pain Score: 3  Pain Location: right knee Pain Descriptors / Indicators: Discomfort Pain Intervention(s): Premedicated before session;Monitored during session;Ice applied    Home Living Family/patient expects to be discharged to:: Skilled nursing facility Living Arrangements: Alone                    Prior Function Level of Independence: Independent               Hand Dominance        Extremity/Trunk Assessment   Upper Extremity Assessment Upper Extremity Assessment:  Overall WFL for tasks assessed    Lower Extremity Assessment Lower Extremity Assessment: RLE deficits/detail;LLE deficits/detail RLE Deficits / Details: able to SLR, reports foot feels numb, decreased dorsiflexion LLE Deficits / Details: WFL       Communication   Communication: No difficulties  Cognition Arousal/Alertness: Awake/alert Behavior During Therapy: WFL for tasks assessed/performed Overall Cognitive Status: Within Functional Limits for tasks assessed                                        General Comments      Exercises     Assessment/Plan    PT Assessment Patient needs continued PT services  PT Problem List Decreased strength;Decreased range of motion;Decreased activity tolerance;Decreased mobility;Decreased knowledge of precautions;Decreased safety awareness;Decreased knowledge of use of DME;Pain       PT Treatment Interventions DME instruction;Therapeutic exercise;Gait training;Functional mobility training;Therapeutic activities;Patient/family education    PT Goals (Current goals can be found in the Care Plan section)  Acute Rehab PT Goals Patient Stated Goal: go to [facility] PT Goal Formulation: With patient/family Time For Goal Achievement: 01/10/18 Potential  to Achieve Goals: Good    Frequency 7X/week   Barriers to discharge        Co-evaluation               AM-PAC PT "6 Clicks" Daily Activity  Outcome Measure Difficulty turning over in bed (including adjusting bedclothes, sheets and blankets)?: A Little Difficulty moving from lying on back to sitting on the side of the bed? : A Little Difficulty sitting down on and standing up from a chair with arms (e.g., wheelchair, bedside commode, etc,.)?: A Lot Help needed moving to and from a bed to chair (including a wheelchair)?: A Lot Help needed walking in hospital room?: Total Help needed climbing 3-5 steps with a railing? : Total 6 Click Score: 12    End of Session  Equipment Utilized During Treatment: Right knee immobilizer Activity Tolerance: Patient tolerated treatment well Patient left: in bed;with call bell/phone within reach;with bed alarm set Nurse Communication: Mobility status PT Visit Diagnosis: Unsteadiness on feet (R26.81);Pain Pain - Right/Left: Right Pain - part of body: Knee    Time: 6553-7482 PT Time Calculation (min) (ACUTE ONLY): 23 min   Charges:   PT Evaluation $PT Eval Low Complexity: 1 Low PT Treatments $Therapeutic Activity: 8-22 mins   PT G CodesTresa Endo PT [phone]   Claretha Cooper 01/05/2018, 4:34 PM

## 2018-01-06 LAB — CBC
HEMATOCRIT: 34.8 % — AB (ref 36.0–46.0)
HEMOGLOBIN: 11.6 g/dL — AB (ref 12.0–15.0)
MCH: 30.3 pg (ref 26.0–34.0)
MCHC: 33.3 g/dL (ref 30.0–36.0)
MCV: 90.9 fL (ref 78.0–100.0)
Platelets: 183 10*3/uL (ref 150–400)
RBC: 3.83 MIL/uL — AB (ref 3.87–5.11)
RDW: 13.2 % (ref 11.5–15.5)
WBC: 7.2 10*3/uL (ref 4.0–10.5)

## 2018-01-06 LAB — BASIC METABOLIC PANEL
ANION GAP: 6 (ref 5–15)
BUN: 10 mg/dL (ref 6–20)
CALCIUM: 7.7 mg/dL — AB (ref 8.9–10.3)
CHLORIDE: 95 mmol/L — AB (ref 101–111)
CO2: 27 mmol/L (ref 22–32)
Creatinine, Ser: 0.4 mg/dL — ABNORMAL LOW (ref 0.44–1.00)
GFR calc non Af Amer: >60 mL/min (ref >60)
Glucose, Bld: 108 mg/dL — ABNORMAL HIGH (ref 65–99)
POTASSIUM: 3.3 mmol/L — AB (ref 3.5–5.1)
Sodium: 128 mmol/L — ABNORMAL LOW (ref 135–145)

## 2018-01-06 MED ORDER — ACETAMINOPHEN 500 MG PO TABS
1000.0000 mg | ORAL_TABLET | Freq: Four times a day (QID) | ORAL | Status: DC | PRN
Start: 2018-01-06 — End: 2018-01-09
  Administered 2018-01-06 – 2018-01-08 (×7): 1000 mg via ORAL
  Filled 2018-01-06 (×9): qty 2

## 2018-01-06 MED ORDER — POTASSIUM CHLORIDE CRYS ER 20 MEQ PO TBCR
40.0000 meq | EXTENDED_RELEASE_TABLET | ORAL | Status: AC
  Administered 2018-01-06 (×2): 40 meq via ORAL
  Filled 2018-01-06 (×2): qty 2

## 2018-01-06 NOTE — Progress Notes (Signed)
Physical Therapy Treatment Patient Details Name: Cassandra Sims MRN: [id number] DOB: Mar 12, 1933 Today's Date: 01/06/2018    History of Present Illness R TKA    PT Comments    POD # 1 am session Assisted OOB to amb a limited distance.  General Gait Details: 50% VC's on proper walker to self distance, upright posture and sequncing.  Very unsteady gait with MAX c/o fatigue and Mod c/o pain.  Recliner following for safety.  Performed some TKR TE's followed by ICE. Pt will need ST Rehab at SNF.  Follow Up Recommendations  SNF     Equipment Recommendations  None recommended by PT    Recommendations for Other Services       Precautions / Restrictions      Mobility  Bed Mobility Overal bed mobility: Needs Assistance Bed Mobility: Supine to Sit     Supine to sit: Min assist     General bed mobility comments: assist with the right leg  Transfers Overall transfer level: Needs assistance Equipment used: Rolling walker (2 wheeled) Transfers: Sit to/from [employer] Transfers Sit to Stand: Min assist;+2 safety/equipment Stand pivot transfers: Min assist;Mod assist;+2 safety/equipment       General transfer comment: 50% VC's on proper hand placement and safety with turns.    Ambulation/Gait Ambulation/Gait assistance: Min assist;+2 safety/equipment Gait Distance (Feet): 8 Feet Assistive device: Rolling walker (2 wheeled) Gait Pattern/deviations: Step-to pattern;Decreased stance time - right Gait velocity: decreased   General Gait Details: 50% VC's on proper walker to self distance, upright posture and sequncing.  Very unsteady gait with MAX c/o fatigue and Mod c/o pain.  Recliner following for safety.     Stairs             Wheelchair Mobility    Modified Rankin (Stroke Patients Only)       Balance                                            Cognition Arousal/Alertness: Awake/alert Behavior During Therapy: WFL for tasks  assessed/performed Overall Cognitive Status: Within Functional Limits for tasks assessed                                        Exercises   Total Knee Replacement TE's 10 reps B LE ankle pumps 10 reps towel squeezes 10 reps knee presses 10 reps heel slides  10 reps SAQ's 10 reps SLR's 10 reps ABD Followed by ICE     General Comments        Pertinent Vitals/Pain Pain Assessment: 0-10 Pain Score: 6  Pain Location: right knee Pain Descriptors / Indicators: Discomfort Pain Intervention(s): Monitored during session;Repositioned;Ice applied;Premedicated before session    Home Living                      Prior Function            PT Goals (current goals can now be found in the care plan section) Progress towards PT goals: Progressing toward goals    Frequency    7X/week      PT Plan Current plan remains appropriate    Co-evaluation              AM-PAC PT "6 Clicks" Daily Activity  Outcome Measure  Difficulty turning over in bed (including adjusting bedclothes, sheets and blankets)?: A Little Difficulty moving from lying on back to sitting on the side of the bed? : A Little Difficulty sitting down on and standing up from a chair with arms (e.g., wheelchair, bedside commode, etc,.)?: A Lot Help needed moving to and from a bed to chair (including a wheelchair)?: A Lot Help needed walking in hospital room?: Total Help needed climbing 3-5 steps with a railing? : Total 6 Click Score: 12    End of Session Equipment Utilized During Treatment: Right knee immobilizer Activity Tolerance: No increased pain;Patient limited by fatigue Patient left: in chair;with call bell/phone within reach;with chair alarm set Nurse Communication: Mobility status PT Visit Diagnosis: Unsteadiness on feet (R26.81);Pain Pain - Right/Left: Right Pain - part of body: Knee     Time: 1010-1036 PT Time Calculation (min) (ACUTE ONLY): 26 min  Charges:  $Gait  Training: 8-22 mins $Therapeutic Exercise: 8-22 mins                    G Codes:       {Shyam Dawson  PTA [facility]  Acute  Rehab Pager      [phone]

## 2018-01-06 NOTE — NC FL2 (Signed)
[place] LEVEL OF CARE SCREENING TOOL     IDENTIFICATION  Patient Name: Cassandra Sims Birthdate: 10-13-1932 Sex: female Admission Date (Current Location): 01/05/2018  [facility] and [place] Number:  Herbalist and Address:  [facility],  [place] [address] [place]      Provider Number: [id number]  Attending Physician Name and Address:  Gaynelle Arabian, MD  Relative Name and Phone Number:       Current Level of Care: Hospital Recommended Level of Care: [place] Prior Approval Number:    Date Approved/Denied:   PASRR Number:   [id number]   Discharge Plan: SNF    Current Diagnoses: Patient Active Problem List   Diagnosis Date Noted  . OA (osteoarthritis) of knee 01/05/2018  . GERD (gastroesophageal reflux disease) 07/03/2016  . Hemidiaphragm paralysis 07/03/2016  . Nodule of right lung 06/19/2016  . Essential hypertension 03/29/2016  . Glaucoma 03/29/2016  . Arthritis 03/29/2016  . Sinusitis, chronic 11/25/2012  . Cough variant asthma 11/25/2012  . Cough 11/24/2012    Orientation RESPIRATION BLADDER Height & Weight     Self, Time, Situation, Place  Normal Continent Weight: 119 lb (54 kg) Height:  5' 2.5" (158.8 cm)  BEHAVIORAL SYMPTOMS/MOOD NEUROLOGICAL BOWEL NUTRITION STATUS      Continent Diet(regular diet)  AMBULATORY STATUS COMMUNICATION OF NEEDS Skin   Extensive Assist Verbally Surgical wounds(closed incision right knee- compression wrap)                       Personal Care Assistance Level of Assistance  Bathing, Feeding, Dressing Bathing Assistance: Limited assistance Feeding assistance: Independent Dressing Assistance: Limited assistance     Functional Limitations Info  Sight, Hearing, Speech Sight Info: Adequate Hearing Info: Adequate Speech Info: Adequate    SPECIAL CARE FACTORS FREQUENCY  PT (By licensed PT), OT (By licensed OT)     PT Frequency: 5x OT Frequency: 5x             Contractures Contractures Info: Not present    Additional Factors Info  Code Status, Allergies Code Status Info: full code Allergies Info: Penicillins, Adhesive Tape, Clindamycin/lincomycin, Codeine, Darvocet Propoxyphene N-acetaminophen, Dilaudid Hydromorphone Hcl, Doxycycline, Flagyl Metronidazole, Lactose Intolerance (Gi), Levofloxacin, Macrodantin Nitrofurantoin Macrocrystal, Morphine And Related, Premarin Conjugated Estrogens, Zantac Ranitidine Hcl, Zofran Ondansetron Hcl, Nitrofuran Derivatives, Sulfa Antibiotics           Current Medications (01/06/2018):  This is the current hospital active medication list Current Facility-Administered Medications  Medication Dose Route Frequency Provider Last Rate Last Dose  . 0.9 %  sodium chloride infusion   Intravenous Continuous Gaynelle Arabian, MD   Stopped at 01/06/18 1030  . albuterol (PROVENTIL) (2.5 MG/3ML) 0.083% nebulizer solution 2.5 mg  2.5 mg Inhalation Q4H PRN Aluisio, Pilar Plate, MD      . amLODipine (NORVASC) tablet 5 mg  5 mg Oral QPM Gaynelle Arabian, MD   5 mg at 01/05/18 1802  . aspirin EC tablet 325 mg  325 mg Oral BID Gaynelle Arabian, MD   325 mg at 01/06/18 1031  . azelastine (ASTELIN) 0.1 % nasal spray 1 spray  1 spray Each Nare BID Gaynelle Arabian, MD   1 spray at 01/06/18 1031  . bisacodyl (DULCOLAX) suppository 10 mg  10 mg Rectal Daily PRN Aluisio, Pilar Plate, MD      . diphenhydrAMINE (BENADRYL) 12.5 MG/5ML elixir 12.5-25 mg  12.5-25 mg Oral Q4H PRN Gaynelle Arabian, MD      .  docusate sodium (COLACE) capsule 100 mg  100 mg Oral BID Gaynelle Arabian, MD   100 mg at 01/06/18 1031  . fluticasone (FLONASE) 50 MCG/ACT nasal spray 1 spray  1 spray Each Nare BID PRN Aluisio, Pilar Plate, MD      . irbesartan (AVAPRO) tablet 300 mg  300 mg Oral Daily Aluisio, Pilar Plate, MD   300 mg at 01/06/18 1031   And  . hydrochlorothiazide (HYDRODIURIL) tablet 25 mg  25 mg Oral Daily Gaynelle Arabian, MD   25 mg at 01/06/18 1031  . latanoprost (XALATAN)  0.005 % ophthalmic solution 1 drop  1 drop Both Eyes QHS Gaynelle Arabian, MD   1 drop at 01/05/18 2111  . menthol-cetylpyridinium (CEPACOL) lozenge 3 mg  1 lozenge Oral PRN Aluisio, Pilar Plate, MD       Or  . phenol (CHLORASEPTIC) mouth spray 1 spray  1 spray Mouth/Throat PRN Aluisio, Pilar Plate, MD      . methocarbamol (ROBAXIN) tablet 500 mg  500 mg Oral Q6H PRN Gaynelle Arabian, MD   500 mg at 01/06/18 0024   Or  . methocarbamol (ROBAXIN) 500 mg in dextrose 5 % 50 mL IVPB  500 mg Intravenous Q6H PRN Gaynelle Arabian, MD   Stopped at 01/05/18 1248  . metoCLOPramide (REGLAN) tablet 5-10 mg  5-10 mg Oral Q8H PRN Aluisio, Pilar Plate, MD       Or  . metoCLOPramide (REGLAN) injection 5-10 mg  5-10 mg Intravenous Q8H PRN Gaynelle Arabian, MD   10 mg at 01/06/18 0714  . montelukast (SINGULAIR) tablet 10 mg  10 mg Oral QHS Gaynelle Arabian, MD   10 mg at 01/05/18 2107  . ondansetron (ZOFRAN) tablet 4 mg  4 mg Oral Q6H PRN Aluisio, Pilar Plate, MD       Or  . ondansetron (ZOFRAN) injection 4 mg  4 mg Intravenous Q6H PRN Gaynelle Arabian, MD   4 mg at 01/06/18 0259  . oxyCODONE (Oxy IR/ROXICODONE) immediate release tablet 10 mg  10 mg Oral Q4H PRN Aluisio, Pilar Plate, MD      . oxyCODONE (Oxy IR/ROXICODONE) immediate release tablet 5 mg  5 mg Oral Q4H PRN Gaynelle Arabian, MD   5 mg at 01/06/18 0728  . polyethylene glycol (MIRALAX / GLYCOLAX) packet 17 g  17 g Oral Daily PRN Aluisio, Pilar Plate, MD      . sodium phosphate (FLEET) 7-19 GM/118ML enema 1 enema  1 enema Rectal Once PRN Aluisio, Pilar Plate, MD      . traMADol Veatrice Bourbon) tablet 50-100 mg  50-100 mg Oral Q6H PRN Gaynelle Arabian, MD   100 mg at 01/05/18 1842     Discharge Medications: Please see discharge summary for a list of discharge medications.  Relevant Imaging Results:  Relevant Lab Results:   Additional Information SS# [id number]  Nila Nephew, LCSW

## 2018-01-06 NOTE — Clinical Social Work Note (Addendum)
Clinical Social Work Assessment  Patient Details  Name: Cassandra Sims MRN: [id number] Date of Birth: 06/11/1933  Date of referral:  01/06/18               Reason for consult:  Facility Placement, Discharge Planning                Permission sought to share information with:  Family Supports Permission granted to share information::  Yes, Verbal Permission Granted  Name::        Agency::  [facility]   Relationship::    Daughters  Contact Information:     Housing/Transportation Living arrangements for the past 2 months:  Single Family Home Source of Information:  Patient Patient Interpreter Needed:  None Criminal Activity/Legal Involvement Pertinent to Current Situation/Hospitalization:  No - Comment as needed Significant Relationships:  Adult Children Lives with:  Self Do you feel safe going back to the place where you live?  Yes Need for family participation in patient care:  No  Care giving concerns:   Patient presented for surgery, with the diagnosis of Osteoarthritis Right Knee. Patient had procedure right total knee arthroplasty as surgical intervention.   Social Worker assessment / plan:CSW met with the patient at bedside, explain role and reason for visit-to assist with discharge to [facility] for short rehab. Patient explained she met with the liaison Whitney to discuss discharge plans for rehab due to her inability to stay home alone after surgery. Patient reports she has adult children but they all have jobs and cannot be with there with her during the day. Patient reports prior to surgery she was very independent and still drives.  CSW inquired about discharge plan with SNF liaison Whitney. She reports the patient will have bed and will have out of network insurance benefits for [employer]. The patient will have to private pay $169 a day. CSW explained this to patient, she is agreeable to pay to $169 day for rehab.   CSW completed FL2  SNF initiated the [employer] .    Plan: SNF placement.   Employment status:  Retired Nurse, adult PT Recommendations:  [place] / Referral to community resources:  [place]  Patient/Family's Response to care: Agreeable and Responding well to care.   Patient/Family's Understanding of and Emotional Response to Diagnosis, Current Treatment, and Prognosis:  Patient alert and oriented x4. Patient has a good understanding of her diagnosis and current treatment plan. She was proactive in prearranging her stay at [facility].   Emotional Assessment Appearance:  Appears stated age Attitude/Demeanor/Rapport:    Affect (typically observed):  Accepting, Pleasant Orientation:  Oriented to Situation, Oriented to  Time, Oriented to Place, Oriented to Self Alcohol / Substance use:  Not Applicable Psych involvement (Current and /or in the community):  No (Comment)  Discharge Needs  Concerns to be addressed:  Discharge Planning Concerns Readmission within the last 30 days:  No Current discharge risk:  Dependent with Mobility, Physical Impairment Barriers to Discharge:  Ship broker, Continued Medical Work up   [employer], [place] 01/06/2018, 2:08 PM

## 2018-01-06 NOTE — Progress Notes (Signed)
   Subjective: 1 Day Post-Op Procedure(s) (LRB): RIGHT TOTAL KNEE ARTHROPLASTY (Right) Patient reports pain as mild.  She had a hard time getting any sleep last night. Feels it may be related to tramadol We will start therapy today.  Plan is to go Skilled nursing facility after hospital stay.  Objective: Vital signs in last 24 hours: Temp:  [97.5 F (36.4 C)-98.5 F (36.9 C)] 98.5 F (36.9 C) (06/18 1003) Pulse Rate:  [65-77] 65 (06/18 1003) Resp:  [14-16] 16 (06/18 1003) BP: (110-145)/(63-87) 110/63 (06/18 1003) SpO2:  [95 %-100 %] 95 % (06/18 1003)  Intake/Output from previous day:  Intake/Output Summary (Last 24 hours) at 01/06/2018 1135 Last data filed at 01/06/2018 1100 Gross per 24 hour  Intake 2116.25 ml  Output 3405 ml  Net -1288.75 ml    Intake/Output this shift: Total I/O In: 240 [P.O.:240] Out: 440 [Urine:440]  Labs: Recent Labs    01/06/18 0553  HGB 11.6*   Recent Labs    01/06/18 0553  WBC 7.2  RBC 3.83*  HCT 34.8*  PLT 183   Recent Labs    01/06/18 0553  NA 128*  K 3.3*  CL 95*  CO2 27  BUN 10  CREATININE 0.40*  GLUCOSE 108*  CALCIUM 7.7*   No results for input(s): LABPT, INR in the last 72 hours.  EXAM General - Patient is Alert, Appropriate and Oriented Extremity - Neurologically intact Neurovascular intact Incision: dressing C/D/I No cellulitis present Dressing - dressing C/D/I Motor Function - intact, moving foot and toes well on exam.  Hemovac pulled without difficulty.  Past Medical History:  Diagnosis Date  . Anxiety   . Arthritis   . Asthma   . Complication of anesthesia    hard to wake up, 2009  sit up in bed to fast and BP dropped low code blue called per pt.  . Family history of adverse reaction to anesthesia    sister has [place]  . GERD (gastroesophageal reflux disease)   . Glaucoma   . Headache    hx of migrains  . HOH (hard of hearing)   . Hypertension   . Hypoglycemia   . Multiple allergies   .  Peripheral vascular disease (HCC)    varicose veins , Laser vein surgery  . PONV (postoperative nausea and vomiting)   . Pre-diabetes   . Rhinitis   . Wears glasses     Assessment/Plan: 1 Day Post-Op Procedure(s) (LRB): RIGHT TOTAL KNEE ARTHROPLASTY (Right) Principal Problem:   OA (osteoarthritis) of knee   Advance diet Up with therapy D/C IV fluids Plan for discharge tomorrow  DVT Prophylaxis - Aspirin Weight-Bearing as tolerated to right leg  Gaynelle Arabian 01/06/2018, 11:35 AM

## 2018-01-06 NOTE — Progress Notes (Signed)
PT Cancellation Note  Patient Details Name: [name] MRN: [id number] DOB: 02/03/1933   Cancelled Treatment:     pt back in bed in [facility] this afternoon with RT in room.     Rica Koyanagi  PTA [facility]  Acute  Rehab Pager      [phone]

## 2018-01-07 LAB — BASIC METABOLIC PANEL
ANION GAP: 9 (ref 5–15)
BUN: 13 mg/dL (ref 6–20)
CO2: 28 mmol/L (ref 22–32)
Calcium: 8.2 mg/dL — ABNORMAL LOW (ref 8.9–10.3)
Chloride: 90 mmol/L — ABNORMAL LOW (ref 101–111)
Creatinine, Ser: 0.43 mg/dL — ABNORMAL LOW (ref 0.44–1.00)
Glucose, Bld: 113 mg/dL — ABNORMAL HIGH (ref 65–99)
POTASSIUM: 3.2 mmol/L — AB (ref 3.5–5.1)
SODIUM: 127 mmol/L — AB (ref 135–145)

## 2018-01-07 LAB — CBC
HCT: 32.3 % — ABNORMAL LOW (ref 36.0–46.0)
HEMOGLOBIN: 11.4 g/dL — AB (ref 12.0–15.0)
MCH: 31.3 pg (ref 26.0–34.0)
MCHC: 35.3 g/dL (ref 30.0–36.0)
MCV: 88.7 fL (ref 78.0–100.0)
PLATELETS: 158 10*3/uL (ref 150–400)
RBC: 3.64 MIL/uL — AB (ref 3.87–5.11)
RDW: 13 % (ref 11.5–15.5)
WBC: 8.4 10*3/uL (ref 4.0–10.5)

## 2018-01-07 MED ORDER — METOCLOPRAMIDE HCL 5 MG PO TABS: 5.0000 mg | ORAL_TABLET | Freq: Three times a day (TID) | ORAL | 0 refills | Status: DC | PRN

## 2018-01-07 MED ORDER — TRAMADOL HCL 50 MG PO TABS: 50.0000 mg | ORAL_TABLET | Freq: Four times a day (QID) | ORAL | 0 refills | Status: DC | PRN

## 2018-01-07 MED ORDER — BISACODYL 10 MG RE SUPP: 10.0000 mg | Freq: Every day | RECTAL | 0 refills | Status: DC | PRN

## 2018-01-07 MED ORDER — OXYCODONE HCL 5 MG PO TABS: 5.0000 mg | ORAL_TABLET | ORAL | 0 refills | Status: DC | PRN

## 2018-01-07 MED ORDER — POLYETHYLENE GLYCOL 3350 17 G PO PACK: 17.0000 g | PACK | Freq: Every day | ORAL | 0 refills | Status: DC | PRN

## 2018-01-07 MED ORDER — ONDANSETRON HCL 4 MG PO TABS: 4.0000 mg | ORAL_TABLET | Freq: Four times a day (QID) | ORAL | 0 refills | Status: DC | PRN

## 2018-01-07 MED ORDER — METHOCARBAMOL 500 MG PO TABS: 500.0000 mg | ORAL_TABLET | Freq: Four times a day (QID) | ORAL | 0 refills | Status: DC | PRN

## 2018-01-07 MED ORDER — DOCUSATE SODIUM 100 MG PO CAPS
100.0000 mg | ORAL_CAPSULE | Freq: Two times a day (BID) | ORAL | 0 refills | Status: DC
Start: 2018-01-07 — End: 2018-10-20

## 2018-01-07 MED ORDER — ASPIRIN 325 MG PO TBEC: 325.0000 mg | DELAYED_RELEASE_TABLET | Freq: Two times a day (BID) | ORAL | 0 refills | Status: DC

## 2018-01-07 MED ORDER — POTASSIUM CHLORIDE CRYS ER 20 MEQ PO TBCR
40.0000 meq | EXTENDED_RELEASE_TABLET | Freq: Two times a day (BID) | ORAL | Status: AC
  Administered 2018-01-07 (×2): 40 meq via ORAL
  Filled 2018-01-07 (×2): qty 2

## 2018-01-07 MED ORDER — FLEET ENEMA 7-19 GM/118ML RE ENEM: 1.0000 | ENEMA | Freq: Once | RECTAL | 0 refills | Status: DC | PRN

## 2018-01-07 NOTE — Discharge Summary (Addendum)
Physician Discharge Summary   Patient ID: [name] MRN: [id number] DOB/AGE: [birth year] 84 y.o.  Admit date: 01/05/2018 Discharge date: 01/07/2018 - Addendum: New discharge date 01/08/2018 - Bed was not available at [place]. Pt was seen in rounds this AM and is stable for discharge.  Primary Diagnosis: Osteoarthritis right knee   Admission Diagnoses:  Past Medical History:  Diagnosis Date  . Anxiety   . Arthritis   . Asthma   . Complication of anesthesia    hard to wake up, 2009  sit up in bed to fast and BP dropped low code blue called per pt.  . Family history of adverse reaction to anesthesia    sister has [place]  . GERD (gastroesophageal reflux disease)   . Glaucoma   . Headache    hx of migrains  . HOH (hard of hearing)   . Hypertension   . Hypoglycemia   . Multiple allergies   . Peripheral vascular disease (HCC)    varicose veins , Laser vein surgery  . PONV (postoperative nausea and vomiting)   . Pre-diabetes   . Rhinitis   . Wears glasses    Discharge Diagnoses:   Principal Problem:   OA (osteoarthritis) of knee  Estimated body mass index is 21.42 kg/m as calculated from the following:   Height as of this encounter: 5' 2.5" (1.588 m).   Weight as of this encounter: 54 kg (119 lb).  Procedure:  Procedure(s) (LRB): RIGHT TOTAL KNEE ARTHROPLASTY (Right)   Consults: None  HPI: Cassandra Sims is a 84 y.o. year old female with end stage OA of her right knee with progressively worsening pain and dysfunction. She has constant pain, with activity and at rest and significant functional deficits with difficulties even with ADLs. She has had extensive non-op management including analgesics, injections of cortisone and viscosupplements, and home exercise program, but remains in significant pain with significant dysfunction.Radiographs show bone on bone arthritis lateral and patellofemoral. She presents now for right Total Knee Arthroplasty.    Laboratory Data: Admission on 01/05/2018  Component Date Value Ref Range Status  . WBC 01/06/2018 7.2  4.0 - 10.5 K/uL Final  . RBC 01/06/2018 3.83* 3.87 - 5.11 MIL/uL Final  . Hemoglobin 01/06/2018 11.6* 12.0 - 15.0 g/dL Final  . HCT 01/06/2018 34.8* 36.0 - 46.0 % Final  . MCV 01/06/2018 90.9  78.0 - 100.0 fL Final  . MCH 01/06/2018 30.3  26.0 - 34.0 pg Final  . MCHC 01/06/2018 33.3  30.0 - 36.0 g/dL Final  . RDW 01/06/2018 13.2  11.5 - 15.5 % Final  . Platelets 01/06/2018 183  150 - 400 K/uL Final   Performed at [facility], [place] [address], [place], [place] [address]  . Sodium 01/06/2018 128* 135 - 145 mmol/L Final  . Potassium 01/06/2018 3.3* 3.5 - 5.1 mmol/L Final  . Chloride 01/06/2018 95* 101 - 111 mmol/L Final  . CO2 01/06/2018 27  22 - 32 mmol/L Final  . Glucose, Bld 01/06/2018 108* 65 - 99 mg/dL Final  . BUN 01/06/2018 10  6 - 20 mg/dL Final  . Creatinine, Ser 01/06/2018 0.40* 0.44 - 1.00 mg/dL Final  . Calcium 01/06/2018 7.7* 8.9 - 10.3 mg/dL Final  . GFR calc non Af Amer 01/06/2018 >60  >60 mL/min Final  . GFR calc Af Amer 01/06/2018 >60  >60 mL/min Final   Comment: (NOTE) The eGFR has been calculated using the CKD EPI equation. This calculation has not  been validated in all clinical situations. eGFR's persistently <60 mL/min signify possible Chronic Kidney Disease.   Georgiann Hahn gap 01/06/2018 6  5 - 15 Final   Performed at [facility], [place] [address], [place], [place] [address]  . Glucose-Capillary 01/05/2018 154* 65 - 99 mg/dL Final  . WBC 01/07/2018 8.4  4.0 - 10.5 K/uL Final  . RBC 01/07/2018 3.64* 3.87 - 5.11 MIL/uL Final  . Hemoglobin 01/07/2018 11.4* 12.0 - 15.0 g/dL Final  . HCT 01/07/2018 32.3* 36.0 - 46.0 % Final  . MCV 01/07/2018 88.7  78.0 - 100.0 fL Final  . MCH 01/07/2018 31.3  26.0 - 34.0 pg Final  . MCHC 01/07/2018 35.3  30.0 - 36.0 g/dL Final  . RDW 01/07/2018 13.0  11.5 - 15.5 % Final  . Platelets 01/07/2018  158  150 - 400 K/uL Final   Performed at [facility], [place] [address], [place], [place] [address]  . Sodium 01/07/2018 127* 135 - 145 mmol/L Final  . Potassium 01/07/2018 3.2* 3.5 - 5.1 mmol/L Final  . Chloride 01/07/2018 90* 101 - 111 mmol/L Final  . CO2 01/07/2018 28  22 - 32 mmol/L Final  . Glucose, Bld 01/07/2018 113* 65 - 99 mg/dL Final  . BUN 01/07/2018 13  6 - 20 mg/dL Final  . Creatinine, Ser 01/07/2018 0.43* 0.44 - 1.00 mg/dL Final  . Calcium 01/07/2018 8.2* 8.9 - 10.3 mg/dL Final  . GFR calc non Af Amer 01/07/2018 >60  >60 mL/min Final  . GFR calc Af Amer 01/07/2018 >60  >60 mL/min Final   Comment: (NOTE) The eGFR has been calculated using the CKD EPI equation. This calculation has not been validated in all clinical situations. eGFR's persistently <60 mL/min signify possible Chronic Kidney Disease.   Georgiann Hahn gap 01/07/2018 9  5 - 15 Final   Performed at [facility], [place] [address] [place], [place] [address]  . WBC 01/08/2018 9.2  4.0 - 10.5 K/uL Final  . RBC 01/08/2018 3.42* 3.87 - 5.11 MIL/uL Final  . Hemoglobin 01/08/2018 10.6* 12.0 - 15.0 g/dL Final  . HCT 01/08/2018 30.1* 36.0 - 46.0 % Final  . MCV 01/08/2018 88.0  78.0 - 100.0 fL Final  . MCH 01/08/2018 31.0  26.0 - 34.0 pg Final  . MCHC 01/08/2018 35.2  30.0 - 36.0 g/dL Final  . RDW 01/08/2018 13.1  11.5 - 15.5 % Final  . Platelets 01/08/2018 148* 150 - 400 K/uL Final   Performed at [facility], [place] [address] [place], [place] [address]  Hospital Outpatient Visit on 12/29/2017  Component Date Value Ref Range Status  . aPTT 12/29/2017 27  24 - 36 seconds Final   Performed at [facility], [place] [address], [place], [place] [address]  . WBC 12/29/2017 5.5  4.0 - 10.5 K/uL Final  . RBC 12/29/2017 4.32  3.87 - 5.11 MIL/uL Final  . Hemoglobin 12/29/2017 13.5  12.0 - 15.0 g/dL Final  . HCT 12/29/2017 39.9  36.0 - 46.0 % Final  . MCV  12/29/2017 92.4  78.0 - 100.0 fL Final  . MCH 12/29/2017 31.3  26.0 - 34.0 pg Final  . MCHC 12/29/2017 33.8  30.0 - 36.0 g/dL Final  . RDW 12/29/2017 13.7  11.5 - 15.5 % Final  . Platelets 12/29/2017 214  150 - 400 K/uL Final   Performed at [facility], [place] [address], [place],  [address]  . Sodium  12/29/2017 139  135 - 145 mmol/L Final  . Potassium 12/29/2017 3.2* 3.5 - 5.1 mmol/L Final  . Chloride 12/29/2017 101  101 - 111 mmol/L Final  . CO2 12/29/2017 30  22 - 32 mmol/L Final  . Glucose, Bld 12/29/2017 101* 65 - 99 mg/dL Final  . BUN 12/29/2017 19  6 - 20 mg/dL Final  . Creatinine, Ser 12/29/2017 0.62  0.44 - 1.00 mg/dL Final  . Calcium 12/29/2017 9.0  8.9 - 10.3 mg/dL Final  . Total Protein 12/29/2017 6.4* 6.5 - 8.1 g/dL Final  . Albumin 12/29/2017 4.2  3.5 - 5.0 g/dL Final  . AST 12/29/2017 40  15 - 41 U/L Final  . ALT 12/29/2017 42  14 - 54 U/L Final  . Alkaline Phosphatase 12/29/2017 55  38 - 126 U/L Final  . Total Bilirubin 12/29/2017 0.5  0.3 - 1.2 mg/dL Final  . GFR calc non Af Amer 12/29/2017 >60  >60 mL/min Final  . GFR calc Af Amer 12/29/2017 >60  >60 mL/min Final   Comment: (NOTE) The eGFR has been calculated using the CKD EPI equation. This calculation has not been validated in all clinical situations. eGFR's persistently <60 mL/min signify possible Chronic Kidney Disease.   Georgiann Hahn gap 12/29/2017 8  5 - 15 Final   Performed at [facility], [place] [address], [place], [place] [address]  . Prothrombin Time 12/29/2017 13.3  11.4 - 15.2 seconds Final  . INR 12/29/2017 1.02   Final   Performed at [facility], [place] [address] [place], [place] [address]  . ABO/RH(D) 12/29/2017 B POS   Final  . Antibody Screen 12/29/2017 NEG   Final  . Sample Expiration 12/29/2017 01/08/2018   Final  . Extend sample reason 12/29/2017    Final                   Value:NO TRANSFUSIONS OR PREGNANCY IN THE PAST 3  MONTHS Performed at [facility], [place] [address] [place], [place] [address]   . Color, Urine 12/29/2017 YELLOW  YELLOW Final  . APPearance 12/29/2017 CLEAR  CLEAR Final  . Specific Gravity, Urine 12/29/2017 1.008  1.005 - 1.030 Final  . pH 12/29/2017 6.0  5.0 - 8.0 Final  . Glucose, UA 12/29/2017 NEGATIVE  NEGATIVE mg/dL Final  . Hgb urine dipstick 12/29/2017 NEGATIVE  NEGATIVE Final  . Bilirubin Urine 12/29/2017 NEGATIVE  NEGATIVE Final  . Ketones, ur 12/29/2017 NEGATIVE  NEGATIVE mg/dL Final  . Protein, ur 12/29/2017 NEGATIVE  NEGATIVE mg/dL Final  . Nitrite 12/29/2017 NEGATIVE  NEGATIVE Final  . Leukocytes, UA 12/29/2017 NEGATIVE  NEGATIVE Final   Performed at [facility], [place] [address] [place], [place] [address]  . MRSA, PCR 12/29/2017 NEGATIVE  NEGATIVE Final  . Staphylococcus aureus 12/29/2017 NEGATIVE  NEGATIVE Final   Comment: (NOTE) The Xpert SA Assay (FDA approved for NASAL specimens in patients 56 years of age and older), is one component of a comprehensive surveillance program. It is not intended to diagnose infection nor to guide or monitor treatment. Performed at [facility], [place] [address], [place], [place] [address]   . Hgb A1c MFr Bld 12/29/2017 5.9* 4.8 - 5.6 % Final   Comment: (NOTE) Pre diabetes:          5.7%-6.4% Diabetes:              >6.4% Glycemic control for   <7.0% adults with diabetes   .  Mean Plasma Glucose 12/29/2017 122.63  mg/dL Final   Performed at [place] [address], [place], [place] [address]  . ABO/RH(D) 12/29/2017    Final                   Value:B POS Performed at [facility], [place] [address] [place], [place] [address]      X-Rays:No results found.  EKG: Orders placed or performed during the hospital encounter of 12/29/17  . EKG 12 lead  . EKG 12 lead     Hospital Course: Cassandra Sims is a 84 y.o. who was admitted to [facility]. They were brought to the operating room on 01/05/2018 and underwent Procedure(s): RIGHT TOTAL KNEE ARTHROPLASTY.  Patient tolerated the procedure well and was later transferred to the recovery room and then to the orthopaedic floor for postoperative care.  They were given PO and IV analgesics for pain control following their surgery.  They were given 24 hours of postoperative antibiotics of  Anti-infectives (From admission, onward)   Start     Dose/Rate Route Frequency Ordered Stop   01/05/18 2200  vancomycin (VANCOCIN) IVPB 1000 mg/200 mL premix     1,000 mg 200 mL/hr over 60 Minutes Intravenous Every 12 hours 01/05/18 1154 01/05/18 2307   01/05/18 0715  vancomycin (VANCOCIN) IVPB 1000 mg/200 mL premix     1,000 mg 200 mL/hr over 60 Minutes Intravenous On call to O.R. 01/05/18 0174 01/05/18 0950     and started on DVT prophylaxis in the form of Aspirin.   PT and OT were ordered for total joint protocol.  Discharge planning consulted to help with postop disposition and equipment needs.  Patient had a good night on the evening of surgery.  They started to get up OOB with therapy on day one. Hemovac drain was pulled without difficulty.  Continued to work with therapy into day two. Dressing was changed on day two and the incision was clean, dry and intact. Patient was seen in rounds and was ready to be discharged to SNF.  Diet: Regular diet Activity: WBAT Follow-up:in 2 weeks in the office with Dr. Wynelle Link Disposition - Skilled nursing facility Discharged Condition: stable   Discharge Instructions    Call MD / Call 911   Complete by:  As directed    If you experience chest pain or shortness of breath, CALL 911 and be transported to the hospital emergency room.  If you develope a fever above 101 F, pus (white drainage) or increased drainage or redness at the wound, or calf pain, call your surgeon's office.   Change dressing   Complete by:  As directed    Change the dressing daily with  sterile 4 x 4 inch gauze dressing and apply TED hose.  You may clean the incision with alcohol prior to redressing.   Constipation Prevention   Complete by:  As directed    Drink plenty of fluids.  Prune juice may be helpful.  You may use a stool softener, such as Colace (over the counter) 100 mg twice a day.  Use MiraLax (over the counter) for constipation as needed.   Diet - low sodium heart healthy   Complete by:  As directed    Discharge instructions   Complete by:  As directed    Dr. Gaynelle Arabian Total Joint Specialist [facility] [address], [place], [place] [address] [phone]  TOTAL KNEE REPLACEMENT POSTOPERATIVE DIRECTIONS  Knee Rehabilitation, Guidelines Following Surgery  Results after knee surgery are often greatly improved when you follow the exercise, range of motion and muscle strengthening exercises prescribed by your doctor. Safety measures are also important to protect the knee from further injury. Any time any of these exercises cause you to have increased pain or swelling in your knee joint, decrease the amount until you are comfortable again and slowly increase them. If you have problems or questions, call your caregiver or physical therapist for advice.   HOME CARE INSTRUCTIONS  Remove items at home which could result in a fall. This includes throw rugs or furniture in walking pathways.  ICE to the affected knee every three hours for 30 minutes at a time and then as needed for pain and swelling.  Continue to use ice on the knee for pain and swelling from surgery. You may notice swelling that will progress down to the foot and ankle.  This is normal after surgery.  Elevate the leg when you are not up walking on it.   Continue to use the breathing machine which will help keep your temperature down.  It is common for your temperature to cycle up and down following surgery, especially at night when you are not up moving around and exerting yourself.  The  breathing machine keeps your lungs expanded and your temperature down. Do not place pillow under knee, focus on keeping the knee straight while resting   DIET You may resume your previous home diet once your are discharged from the hospital.  DRESSING / WOUND CARE / SHOWERING You may shower 3 days after surgery, but keep the wounds dry during showering.  You may use an occlusive plastic wrap (Press'n Seal for example), NO SOAKING/SUBMERGING IN THE BATHTUB.  If the bandage gets wet, change with a clean dry gauze.  If the incision gets wet, pat the wound dry with a clean towel. You may start showering once you are discharged home but do not submerge the incision under water. Just pat the incision dry and apply a dry gauze dressing on daily. Change the surgical dressing daily and reapply a dry dressing each time.  ACTIVITY Walk with your walker as instructed. Use walker as long as suggested by your caregivers. Avoid periods of inactivity such as sitting longer than an hour when not asleep. This helps prevent blood clots.  You may resume a sexual relationship in one month or when given the OK by your doctor.  You may return to work once you are cleared by your doctor.  Do not drive a car for 6 weeks or until released by you surgeon.  Do not drive while taking narcotics.  WEIGHT BEARING Weight bearing as tolerated with assist device (walker, cane, etc) as directed, use it as long as suggested by your surgeon or therapist, typically at least 4-6 weeks.  POSTOPERATIVE CONSTIPATION PROTOCOL Constipation - defined medically as fewer than three stools per week and severe constipation as less than one stool per week.  One of the most common issues patients have following surgery is constipation.  Even if you have a regular bowel pattern at home, your normal regimen is likely to be disrupted due to multiple reasons following surgery.  Combination of anesthesia, postoperative narcotics, change in  appetite and fluid intake all can affect your bowels.  In order to avoid complications following surgery, here are some recommendations in order to help you during your recovery period.  Colace (docusate) - Pick up an  over-the-counter form of Colace or another stool softener and take twice a day as long as you are requiring postoperative pain medications.  Take with a full glass of water daily.  If you experience loose stools or diarrhea, hold the colace until you stool forms back up.  If your symptoms do not get better within 1 week or if they get worse, check with your doctor.  Dulcolax (bisacodyl) - Pick up over-the-counter and take as directed by the product packaging as needed to assist with the movement of your bowels.  Take with a full glass of water.  Use this product as needed if not relieved by Colace only.   MiraLax (polyethylene glycol) - Pick up over-the-counter to have on hand.  MiraLax is a solution that will increase the amount of water in your bowels to assist with bowel movements.  Take as directed and can mix with a glass of water, juice, soda, coffee, or tea.  Take if you go more than two days without a movement. Do not use MiraLax more than once per day. Call your doctor if you are still constipated or irregular after using this medication for 7 days in a row.  If you continue to have problems with postoperative constipation, please contact the office for further assistance and recommendations.  If you experience "the worst abdominal pain ever" or develop nausea or vomiting, please contact the office immediatly for further recommendations for treatment.  ITCHING  If you experience itching with your medications, try taking only a single pain pill, or even half a pain pill at a time.  You can also use Benadryl over the counter for itching or also to help with sleep.   TED HOSE STOCKINGS Wear the elastic stockings on both legs for three weeks following surgery during the day but you  may remove then at night for sleeping.  MEDICATIONS See your medication summary on the "After Visit Summary" that the nursing staff will review with you prior to discharge.  You may have some home medications which will be placed on hold until you complete the course of blood thinner medication.  It is important for you to complete the blood thinner medication as prescribed by your surgeon.  Continue your approved medications as instructed at time of discharge.  PRECAUTIONS If you experience chest pain or shortness of breath - call 911 immediately for transfer to the hospital emergency department.  If you develop a fever greater that 101 F, purulent drainage from wound, increased redness or drainage from wound, foul odor from the wound/dressing, or calf pain - CONTACT YOUR SURGEON.                                                   FOLLOW-UP APPOINTMENTS Make sure you keep all of your appointments after your operation with your surgeon and caregivers. You should call the office at the above phone number and make an appointment for approximately two weeks after the date of your surgery or on the date instructed by your surgeon outlined in the "After Visit Summary".   RANGE OF MOTION AND STRENGTHENING EXERCISES  Rehabilitation of the knee is important following a knee injury or an operation. After just a few days of immobilization, the muscles of the thigh which control the knee become weakened and shrink (atrophy). Knee exercises are designed to  build up the tone and strength of the thigh muscles and to improve knee motion. Often times heat used for twenty to thirty minutes before working out will loosen up your tissues and help with improving the range of motion but do not use heat for the first two weeks following surgery. These exercises can be done on a training (exercise) mat, on the floor, on a table or on a bed. Use what ever works the best and is most comfortable for you Knee exercises include:   Leg Lifts - While your knee is still immobilized in a splint or cast, you can do straight leg raises. Lift the leg to 60 degrees, hold for 3 sec, and slowly lower the leg. Repeat 10-20 times 2-3 times daily. Perform this exercise against resistance later as your knee gets better.  Quad and Hamstring Sets - Tighten up the muscle on the front of the thigh (Quad) and hold for 5-10 sec. Repeat this 10-20 times hourly. Hamstring sets are done by pushing the foot backward against an object and holding for 5-10 sec. Repeat as with quad sets.  Leg Slides: Lying on your back, slowly slide your foot toward your buttocks, bending your knee up off the floor (only go as far as is comfortable). Then slowly slide your foot back down until your leg is flat on the floor again. Angel Wings: Lying on your back spread your legs to the side as far apart as you can without causing discomfort.  A rehabilitation program following serious knee injuries can speed recovery and prevent re-injury in the future due to weakened muscles. Contact your doctor or a physical therapist for more information on knee rehabilitation.   IF YOU ARE TRANSFERRED TO A SKILLED REHAB FACILITY If the patient is transferred to a skilled rehab facility following release from the hospital, a list of the current medications will be sent to the facility for the patient to continue.  When discharged from the skilled rehab facility, please have the facility set up the patient's [place] prior to being released. Also, the skilled facility will be responsible for providing the patient with their medications at time of release from the facility to include their pain medication, the muscle relaxants, and their blood thinner medication. If the patient is still at the rehab facility at time of the two week follow up appointment, the skilled rehab facility will also need to assist the patient in arranging follow up appointment in our office and any  transportation needs.  MAKE SURE YOU:  Understand these instructions.  Get help right away if you are not doing well or get worse.    Pick up stool softner and laxative for home use following surgery while on pain medications. Do not submerge incision under water. Please use good hand washing techniques while changing dressing each day. May shower starting three days after surgery. Please use a clean towel to pat the incision dry following showers. Continue to use ice for pain and swelling after surgery. Do not use any lotions or creams on the incision until instructed by your surgeon.   Do not put a pillow under the knee. Place it under the heel.   Complete by:  As directed    Driving restrictions   Complete by:  As directed    No driving for two weeks   TED hose   Complete by:  As directed    Use stockings (TED hose) for three weeks on both leg(s).  You may remove them at night for sleeping.   Weight bearing as tolerated   Complete by:  As directed      Allergies as of 01/08/2018      Reactions   Penicillins Shortness Of Breath, Swelling   Has patient had a PCN reaction causing immediate rash, facial/tongue/throat swelling, SOB or lightheadedness with hypotension: Yes Has patient had a PCN reaction causing severe rash involving mucus membranes or skin necrosis: No Has patient had a PCN reaction that required hospitalization: No Has patient had a PCN reaction occurring within the last 10 years: No If all of the above answers are "NO", then may proceed with Cephalosporin use.   Adhesive [tape] Itching   BAND-AID TAPE-skin redness/itching   Clindamycin/lincomycin Other (See Comments)   Severe joint pain.   Codeine Nausea And Vomiting   Darvocet [propoxyphene N-acetaminophen] Other (See Comments)   HTN   Dilaudid [hydromorphone Hcl] Nausea And Vomiting   DROP BLOOD PRESSURE HYPOTENSIVE   Doxycycline Other (See Comments)   Pt states she tolerates OKAY   Flagyl  [metronidazole] Other (See Comments)   Joint pains   Lactose Intolerance (gi)    Levofloxacin    Joint pain   Macrodantin [nitrofurantoin Macrocrystal]    Morphine And Related    hallucinate   Premarin [conjugated Estrogens] Itching   Tramadol Hcl Other (See Comments)   Causes insomnia   Zantac [ranitidine Hcl]    Itching    Zofran [ondansetron Hcl]    HTN   Nitrofuran Derivatives Rash   Sulfa Antibiotics Rash      Medication List    STOP taking these medications   meloxicam 7.5 MG tablet Commonly known as:  MOBIC     TAKE these medications   ACIDOPHILUS PO Take 1 capsule by mouth daily.   albuterol 108 (90 Base) MCG/ACT inhaler Commonly known as:  PROVENTIL HFA;VENTOLIN HFA Inhale 2 puffs into the lungs every 4 (four) hours as needed for wheezing. Reported on 10/10/2015   amLODipine 5 MG tablet Commonly known as:  NORVASC Take 5 mg by mouth every evening.   aspirin 325 MG EC tablet Take 1 tablet (325 mg total) by mouth 2 (two) times daily for 19 days. Take one tablet (325 mg) Aspirin two times a day for three weeks following surgery. Then take one baby Aspirin (81 mg) once a day for three weeks. Then discontinue aspirin.   Azelastine HCl 0.15 % Soln Place 1 spray into both nostrils 2 (two) times daily.   bimatoprost 0.01 % Soln Commonly known as:  LUMIGAN Place 1 drop into both eyes nightly.   bisacodyl 10 MG suppository Commonly known as:  DULCOLAX Place 1 suppository (10 mg total) rectally daily as needed for moderate constipation.   budesonide-formoterol 160-4.5 MCG/ACT inhaler Commonly known as:  SYMBICORT Inhale two puffs twice daily to prevent cough or wheeze.   CALTRATE 600+D3 PO Take 1 tablet by mouth daily.   cholecalciferol 1000 units tablet Commonly known as:  VITAMIN D Take 1,000 Units by mouth daily.   docusate sodium 100 MG capsule Commonly known as:  COLACE Take 1 capsule (100 mg total) by mouth 2 (two) times daily.   FASENRA 30  MG/ML Sosy Generic drug:  Benralizumab Inject 30 mg into the skin every 8 (eight) weeks.   fluticasone 50 MCG/ACT nasal spray Commonly known as:  FLONASE Place 1 spray into both nostrils 2 (two) times daily as needed for allergies.   FUNGI-NAIL EX Apply 1 application topically  daily. Applied to affected toe nail daily   LUTEIN 20 Caps Take 20 mg by mouth daily.   methocarbamol 500 MG tablet Commonly known as:  ROBAXIN Take 1 tablet (500 mg total) by mouth every 6 (six) hours as needed for muscle spasms.   metoCLOPramide 5 MG tablet Commonly known as:  REGLAN Take 1-2 tablets (5-10 mg total) by mouth every 8 (eight) hours as needed for nausea (if ondansetron (ZOFRAN) ineffective.).   mometasone 0.1 % ointment Commonly known as:  ELOCON Apply topically 2 (two) times daily. What changed:    how much to take  when to take this  reasons to take this   mometasone-formoterol 100-5 MCG/ACT Aero Commonly known as:  DULERA Take 2 puffs first thing in am and then another 2 puffs about 12 hours later. What changed:    how much to take  how to take this  when to take this  additional instructions   mometasone-formoterol 200-5 MCG/ACT Aero Commonly known as:  DULERA Inhale 2 puffs into the lungs 2 (two) times daily. What changed:  Another medication with the same name was changed. Make sure you understand how and when to take each.   montelukast 10 MG tablet Commonly known as:  SINGULAIR Take 10 mg by mouth at bedtime.   multivitamin with minerals Tabs tablet Take 1 tablet by mouth daily. Nature Made Multi Vitamin for Her 50+ W/No Iron   NONFORMULARY OR COMPOUNDED ITEM Place 1 drop into both eyes 4 (four) times daily. Autologous serum eye drops (ASED) made specifically for patient from patient's own blood serum and plasma   ondansetron 4 MG tablet Commonly known as:  ZOFRAN Take 1 tablet (4 mg total) by mouth every 6 (six) hours as needed for nausea.   oxyCODONE 5  MG immediate release tablet Commonly known as:  Oxy IR/ROXICODONE Take 1 tablet (5 mg total) by mouth every 4 (four) hours as needed for moderate pain or severe pain.   polyethylene glycol packet Commonly known as:  MIRALAX / GLYCOLAX Take 17 g by mouth daily as needed for mild constipation.   psyllium 58.6 % powder Commonly known as:  METAMUCIL Take by mouth daily. 14.3 g (1 heaping tablespoon) every morning.   REFRESH TEARS 0.5 % Soln Generic drug:  carboxymethylcellulose Place 1 drop into both eyes 5 (five) times daily as needed (for dry eyes).   sodium phosphate 7-19 GM/118ML Enem Place 133 mLs (1 enema total) rectally once as needed for severe constipation.   tacrolimus 0.1 % ointment Commonly known as:  PROTOPIC Apply topically 2 (two) times daily. What changed:    how much to take  when to take this  reasons to take this   telmisartan-hydrochlorothiazide 80-25 MG tablet Commonly known as:  MICARDIS HCT Take 1 tablet by mouth daily.   traMADol 50 MG tablet Commonly known as:  ULTRAM Take 1-2 tablets (50-100 mg total) by mouth every 6 (six) hours as needed for moderate pain (Give second, if unresponsive to oxycodone.).   Turmeric 450 MG Caps Take 450 mg by mouth daily.            Discharge Care Instructions  (From admission, onward)        Start     Ordered   01/07/18 0000  Weight bearing as tolerated     01/07/18 0909   01/07/18 0000  Change dressing    Comments:  Change the dressing daily with sterile 4 x 4 inch gauze dressing and  apply TED hose.  You may clean the incision with alcohol prior to redressing.   01/07/18 4235      Contact information for follow-up providers    Gaynelle Arabian, MD Follow up in 2 week(s).   Specialty:  Orthopedic Surgery Contact information: [address] [place] [address] [phone]            Contact information for after-discharge care    Destination    [facility]  SNF/ALF .   Service:  Skilled Nursing Contact information: [address] [place] [phone]                  Signed: Theresa Duty, PA-C Orthopaedic Surgery 01/08/2018, 7:31 AM

## 2018-01-07 NOTE — Progress Notes (Signed)
Physical Therapy Treatment Patient Details Name: ASLI TOKARSKI MRN: [id number] DOB: 01-Jul-1933 Today's Date: 01/07/2018    History of Present Illness R TKA    PT Comments    POD # 2 pm session Pt just got back to bed from amb from bathroom with NT.  Pt reports she feels better and agreed to perform TKE TE's.   Follow Up Recommendations  SNF     Equipment Recommendations  None recommended by PT    Recommendations for Other Services       Precautions / Restrictions Precautions Precautions: Knee;Fall Precaution Comments: pt able to perform active SLR so did not use KI Restrictions Weight Bearing Restrictions: No Other Position/Activity Restrictions: WBAT              Exercises   Total Knee Replacement TE's 10 reps B LE ankle pumps 10 reps towel squeezes 10 reps knee presses 10 reps heel slides  10 reps SAQ's 10 reps SLR's 10 reps ABD Followed by ICE     General Comments        Pertinent Vitals/Pain Pain Assessment: 0-10 Pain Score: 3  Pain Location: right knee Pain Descriptors / Indicators: Discomfort Pain Intervention(s): Monitored during session;Repositioned;Ice applied    Home Living                      Prior Function            PT Goals (current goals can now be found in the care plan section) Progress towards PT goals: Progressing toward goals    Frequency    7X/week      PT Plan Current plan remains appropriate    Co-evaluation              AM-PAC PT "6 Clicks" Daily Activity  Outcome Measure  Difficulty turning over in bed (including adjusting bedclothes, sheets and blankets)?: A Little Difficulty moving from lying on back to sitting on the side of the bed? : A Little Difficulty sitting down on and standing up from a chair with arms (e.g., wheelchair, bedside commode, etc,.)?: A Little Help needed moving to and from a bed to chair (including a wheelchair)?: A Lot Help needed walking in hospital room?: A  Lot Help needed climbing 3-5 steps with a railing? : Total 6 Click Score: 14    End of Session Equipment Utilized During Treatment: Gait belt Activity Tolerance: Other (comment)(dizzy/nausea) Patient left: in bed;with call bell/phone within reach Nurse Communication: Mobility status PT Visit Diagnosis: Unsteadiness on feet (R26.81);Pain Pain - Right/Left: Right Pain - part of body: Knee     Time: 1428-1440 PT Time Calculation (min) (ACUTE ONLY): 12 min  Charges:  $Therapeutic Exercise: 8-22 mins                     G Codes:       Rica Koyanagi  PTA [facility]  Acute  Rehab Pager      [phone]

## 2018-01-07 NOTE — Progress Notes (Signed)
Physical Therapy Treatment Patient Details Name: Cassandra Sims MRN: [id number] DOB: 19-Nov-1932 Today's Date: 01/07/2018    History of Present Illness R TKA    PT Comments    POD # 2 am session Assisted to EOB pt c/o increased dizziness with nausea.  Vitals taken see Epic.  Assisted back to bed and reported to RN.   Follow Up Recommendations  SNF     Equipment Recommendations  None recommended by PT    Recommendations for Other Services       Precautions / Restrictions Precautions Precautions: Knee;Fall Precaution Comments: pt able to perform active SLR so did not use KI Restrictions Weight Bearing Restrictions: No Other Position/Activity Restrictions: WBAT     Mobility  Bed Mobility Overal bed mobility: Needs Assistance Bed Mobility: Supine to Sit;Sit to Supine     Supine to sit: Min assist Sit to supine: Min assist;Mod assist   General bed mobility comments: assist to EOB pt c/o increased dizziness and nausea with x 3 dry heave      Reported to RN  Transfers                 General transfer comment: pt sat EOB only  Ambulation/Gait                 Stairs             Wheelchair Mobility    Modified Rankin (Stroke Patients Only)       Balance                                            Cognition                                              Exercises      General Comments        Pertinent Vitals/Pain Pain Assessment: 0-10 Pain Score: 3  Pain Location: right knee Pain Descriptors / Indicators: Discomfort Pain Intervention(s): Monitored during session;Repositioned;Ice applied    Home Living                      Prior Function            PT Goals (current goals can now be found in the care plan section) Progress towards PT goals: Progressing toward goals    Frequency    7X/week      PT Plan Current plan remains appropriate    Co-evaluation               AM-PAC PT "6 Clicks" Daily Activity  Outcome Measure  Difficulty turning over in bed (including adjusting bedclothes, sheets and blankets)?: A Little Difficulty moving from lying on back to sitting on the side of the bed? : A Little Difficulty sitting down on and standing up from a chair with arms (e.g., wheelchair, bedside commode, etc,.)?: A Little Help needed moving to and from a bed to chair (including a wheelchair)?: A Lot Help needed walking in hospital room?: A Lot Help needed climbing 3-5 steps with a railing? : Total 6 Click Score: 14    End of Session Equipment Utilized During Treatment: Gait belt Activity Tolerance: Other (comment)(dizzy/nausea) Patient left: in bed;with call bell/phone within reach  Nurse Communication: Mobility status PT Visit Diagnosis: Unsteadiness on feet (R26.81);Pain Pain - Right/Left: Right Pain - part of body: Knee     Time: 2993-7169 PT Time Calculation (min) (ACUTE ONLY): 15 min  Charges:  $Therapeutic Activity: 8-22 mins                    G Codes:       {Travius Crochet  PTA [facility]  Acute  Rehab Pager      [phone]

## 2018-01-08 LAB — CBC
HEMATOCRIT: 30.1 % — AB (ref 36.0–46.0)
HEMOGLOBIN: 10.6 g/dL — AB (ref 12.0–15.0)
MCH: 31 pg (ref 26.0–34.0)
MCHC: 35.2 g/dL (ref 30.0–36.0)
MCV: 88 fL (ref 78.0–100.0)
Platelets: 148 10*3/uL — ABNORMAL LOW (ref 150–400)
RBC: 3.42 MIL/uL — AB (ref 3.87–5.11)
RDW: 13.1 % (ref 11.5–15.5)
WBC: 9.2 10*3/uL (ref 4.0–10.5)

## 2018-01-08 MED ORDER — ASPIRIN 325 MG PO TBEC: 325.0000 mg | DELAYED_RELEASE_TABLET | Freq: Two times a day (BID) | ORAL | 0 refills | Status: DC

## 2018-01-08 MED ORDER — OXYCODONE HCL 5 MG PO TABS: 5.0000 mg | ORAL_TABLET | Freq: Four times a day (QID) | ORAL | 0 refills | Status: DC | PRN

## 2018-01-08 NOTE — Care Management Important Message (Signed)
Important Message  Patient Details  Name: Cassandra Sims MRN: [id number] Date of Birth: [birth year]   Medicare Important Message Given:  Yes    Kerin Salen 01/08/2018, 12:26 [place] Message  Patient Details  Name: Cassandra Sims MRN: [id number] Date of Birth: [birth year]   Medicare Important Message Given:  Yes    Kerin Salen 01/08/2018, 12:26 PM

## 2018-01-08 NOTE — Progress Notes (Signed)
Physical Therapy Treatment Patient Details Name: Cassandra Sims MRN: [id number] DOB: 07-06-1933 Today's Date: 01/08/2018    History of Present Illness R TKA    PT Comments    POD # 2 am session Assisted OOB to amb to bathroom then a limited distance in hallway.  Pt progressing slowly and will need ST Rehab at SNF.   Follow Up Recommendations  SNF     Equipment Recommendations  None recommended by PT    Recommendations for Other Services       Precautions / Restrictions Precautions Precautions: Knee;Fall Precaution Comments: pt able to perform active SLR so did not use KI Restrictions Weight Bearing Restrictions: No RLE Weight Bearing: Weight bearing as tolerated    Mobility  Bed Mobility Overal bed mobility: Needs Assistance Bed Mobility: Supine to Sit     Supine to sit: Min guard;Min assist     General bed mobility comments: mild c/o dizziness which decreased with rest time EOB  Transfers Overall transfer level: Needs assistance Equipment used: Rolling walker (2 wheeled) Transfers: Sit to/from [facility] Sit to Stand: Min assist Stand pivot transfers: Min assist;Mod assist       General transfer comment: 50% VC's on proper hand placement and safety with turns  Ambulation/Gait Ambulation/Gait assistance: Min guard;Min assist Gait Distance (Feet): 22 Feet Assistive device: Rolling walker (2 wheeled) Gait Pattern/deviations: Step-to pattern;Decreased stance time - right Gait velocity: decreased   General Gait Details: 50% VC's on proper walker to self distance, upright posture and sequncing.  Very unsteady gait with MAX c/o fatigue and Mos c/o pain.  Recliner following for safety.     Stairs             Wheelchair Mobility    Modified Rankin (Stroke Patients Only)       Balance                                            Cognition Arousal/Alertness: Awake/alert Behavior During Therapy: WFL for tasks  assessed/performed Overall Cognitive Status: Within Functional Limits for tasks assessed                                        Exercises      General Comments        Pertinent Vitals/Pain Pain Assessment: 0-10 Pain Score: 3  Pain Location: right knee Pain Descriptors / Indicators: Sore Pain Intervention(s): Monitored during session;Repositioned;Ice applied    Home Living                      Prior Function            PT Goals (current goals can now be found in the care plan section) Progress towards PT goals: Progressing toward goals    Frequency    7X/week      PT Plan Current plan remains appropriate    Co-evaluation              AM-PAC PT "6 Clicks" Daily Activity  Outcome Measure  Difficulty turning over in bed (including adjusting bedclothes, sheets and blankets)?: A Little Difficulty moving from lying on back to sitting on the side of the bed? : A Little Difficulty sitting down on and standing up from a chair with  arms (e.g., wheelchair, bedside commode, etc,.)?: A Little Help needed moving to and from a bed to chair (including a wheelchair)?: A Lot Help needed walking in hospital room?: A Lot Help needed climbing 3-5 steps with a railing? : Total 6 Click Score: 14    End of Session Equipment Utilized During Treatment: Gait belt Activity Tolerance: (dizzy) Patient left: in chair Nurse Communication: Mobility status PT Visit Diagnosis: Unsteadiness on feet (R26.81);Pain Pain - Right/Left: Right Pain - part of body: Knee     Time: 6837-2902 PT Time Calculation (min) (ACUTE ONLY): 24 min  Charges:  $Gait Training: 8-22 mins $Therapeutic Activity: 8-22 mins                    G Codes:       Rica Koyanagi  PTA [facility]  Acute  Rehab Pager      [phone]

## 2018-01-08 NOTE — Progress Notes (Addendum)
Insurance authorization still pending as of 6/19 for placement at [facility] rehab. CSW will update patient and medical staff if authorization is received today.    4:30pm The liaison Whitney at [facility] reports they have not received authorization from [employer]. All document have been faxed. She attempted to call, clinicals still under review.   Nurse staff informed the patient.    Kathrin Greathouse, Marlinda Mike, MSW Clinical Social Worker  [phone] 01/08/2018  8:14 AM

## 2018-01-09 MED ORDER — OXYCODONE HCL 5 MG PO TABS: 5.0000 mg | ORAL_TABLET | Freq: Four times a day (QID) | ORAL | 0 refills | Status: DC | PRN

## 2018-01-09 MED ORDER — ASPIRIN 325 MG PO TBEC: 325.0000 mg | DELAYED_RELEASE_TABLET | Freq: Two times a day (BID) | ORAL | 0 refills | Status: AC

## 2018-01-09 NOTE — Progress Notes (Signed)
Attempted report to [facility] x2.  Unable to give report. Placed unit number with patient's packet for receiving RN to call.

## 2018-01-09 NOTE — Progress Notes (Signed)
Physical Therapy Treatment Patient Details Name: Cassandra Sims MRN: [id number] DOB: 01-Apr-1933 Today's Date: 01/09/2018    History of Present Illness R TKA    PT Comments    Pt progressing toward PT goals, continue to recommend SNF   Follow Up Recommendations  SNF     Equipment Recommendations  None recommended by PT    Recommendations for Other Services       Precautions / Restrictions Precautions Precautions: Knee;Fall Precaution Comments: unbable to do SLR today, KI placed for amb Required Braces or Orthoses: Knee Immobilizer - Right Knee Immobilizer - Right: Discontinue once straight leg raise with < 10 degree lag Restrictions Weight Bearing Restrictions: No RLE Weight Bearing: Weight bearing as tolerated    Mobility  Bed Mobility Overal bed mobility: Needs Assistance Bed Mobility: Supine to Sit     Supine to sit: Min guard;Min assist     General bed mobility comments: min with RLE, cues for technique, partial roll to decr neck pain  Transfers Overall transfer level: Needs assistance Equipment used: Rolling walker (2 wheeled)   Sit to Stand: Min assist;Min guard         General transfer comment: cues fro hand placement  Ambulation/Gait Ambulation/Gait assistance: Min Gaffer (Feet): 55 Feet(10' more) Assistive device: Rolling walker (2 wheeled) Gait Pattern/deviations: Step-to pattern;Decreased stance time - right;Trunk flexed     General Gait Details: cues for trunk extension, upward gaze, step length and overall safety with RW;    Stairs             Wheelchair Mobility    Modified Rankin (Stroke Patients Only)       Balance                                            Cognition Arousal/Alertness: Awake/alert Behavior During Therapy: WFL for tasks assessed/performed Overall Cognitive Status: Within Functional Limits for tasks assessed                                         Exercises Total Joint Exercises Ankle Circles/Pumps: AROM;Both;10 reps Quad Sets: AROM;Both;10 reps Heel Slides: AAROM;Both;10 reps;Right Straight Leg Raises: AAROM;Right;10 reps Shoulder Exercises Neck Flexion: Self ROM;AROM Neck Lateral Flexion - Right: Self ROM;AROM(gentle lateral stretches right and left) Neck Lateral Flexion - Left: AROM;Self ROM Other Exercises Other Exercises: shoulder shrugs, contract relax to relieve tension and decr cervical/upper back pain    General Comments        Pertinent Vitals/Pain Pain Assessment: 0-10 Pain Score: 6  Pain Location: right knee and neck Pain Descriptors / Indicators: Sore Pain Intervention(s): Monitored during session;Premedicated before session;Heat applied;Repositioned(heat to neck)    Home Living                      Prior Function            PT Goals (current goals can now be found in the care plan section) Acute Rehab PT Goals Patient Stated Goal: go to [facility] PT Goal Formulation: With patient/family Time For Goal Achievement: 01/10/18 Potential to Achieve Goals: Good Progress towards PT goals: Progressing toward goals    Frequency    7X/week      PT Plan Current plan remains appropriate    Co-evaluation  AM-PAC PT "6 Clicks" Daily Activity  Outcome Measure  Difficulty turning over in bed (including adjusting bedclothes, sheets and blankets)?: A Little Difficulty moving from lying on back to sitting on the side of the bed? : Unable Difficulty sitting down on and standing up from a chair with arms (e.g., wheelchair, bedside commode, etc,.)?: Unable Help needed moving to and from a bed to chair (including a wheelchair)?: A Little Help needed walking in hospital room?: A Little Help needed climbing 3-5 steps with a railing? : A Lot 6 Click Score: 13    End of Session Equipment Utilized During Treatment: Gait belt Activity Tolerance: Patient tolerated treatment  well Patient left: in chair;with call bell/phone within reach;with chair alarm set   PT Visit Diagnosis: Unsteadiness on feet (R26.81);Pain Pain - Right/Left: Right Pain - part of body: Knee     Time: 0300-9233 PT Time Calculation (min) (ACUTE ONLY): 34 min  Charges:  $Gait Training: 8-22 mins $Therapeutic Exercise: 8-22 mins                    G CodesKenyon Ana, PT Pager: [phone] 01/09/2018    Kenyon Ana 01/09/2018, 11:56 AM

## 2018-01-09 NOTE — Progress Notes (Signed)
   Subjective: 4 Days Post-Op Procedure(s) (LRB): RIGHT TOTAL KNEE ARTHROPLASTY (Right) Patient reports pain as mild.   Patient seen in rounds for Dr. Wynelle Link. Patient is well, and has had no acute complaints or problems. Patient is currently waiting on insurance approval to be discharged to [facility]. States she is doing well this AM. Voiding without difficulty and positive flatus. Denies chest pain, SOB or calf pain. Plan is to go Skilled nursing facility after hospital stay.  Objective: Vital signs in last 24 hours: Temp:  [97.8 F (36.6 C)-98.4 F (36.9 C)] 97.8 F (36.6 C) (06/21 0626) Pulse Rate:  [71-77] 71 (06/21 0626) Resp:  [16-18] 16 (06/21 0626) BP: (123-151)/(68-76) 134/68 (06/21 0626) SpO2:  [96 %-98 %] 98 % (06/21 0626)  Intake/Output from previous day:  Intake/Output Summary (Last 24 hours) at 01/09/2018 1101 Last data filed at 01/08/2018 1334 Gross per 24 hour  Intake 240 ml  Output -  Net 240 ml    Intake/Output this shift: No intake/output data recorded.  Labs: Recent Labs    01/07/18 0536 01/08/18 0528  HGB 11.4* 10.6*   Recent Labs    01/07/18 0536 01/08/18 0528  WBC 8.4 9.2  RBC 3.64* 3.42*  HCT 32.3* 30.1*  PLT 158 148*   Recent Labs    01/07/18 0536  NA 127*  K 3.2*  CL 90*  CO2 28  BUN 13  CREATININE 0.43*  GLUCOSE 113*  CALCIUM 8.2*   No results for input(s): LABPT, INR in the last 72 hours.  Exam: General - Patient is Alert and Oriented Extremity - Neurologically intact Neurovascular intact Sensation intact distally Dorsiflexion/Plantar flexion intact Dressing/Incision - clean, dry, no drainage Motor Function - intact, moving foot and toes well on exam.  Past Medical History:  Diagnosis Date  . Anxiety   . Arthritis   . Asthma   . Complication of anesthesia    hard to wake up, 2009  sit up in bed to fast and BP dropped low code blue called per pt.  . Family history of adverse reaction to anesthesia    sister has  [place]  . GERD (gastroesophageal reflux disease)   . Glaucoma   . Headache    hx of migrains  . HOH (hard of hearing)   . Hypertension   . Hypoglycemia   . Multiple allergies   . Peripheral vascular disease (HCC)    varicose veins , Laser vein surgery  . PONV (postoperative nausea and vomiting)   . Pre-diabetes   . Rhinitis   . Wears glasses     Assessment/Plan: 4 Days Post-Op Procedure(s) (LRB): RIGHT TOTAL KNEE ARTHROPLASTY (Right) Principal Problem:   OA (osteoarthritis) of knee  Estimated body mass index is 21.42 kg/m as calculated from the following:   Height as of this encounter: 5' 2.5" (1.588 m).   Weight as of this encounter: 54 kg (119 lb). Up with therapy  DVT Prophylaxis - Aspirin Weight-bearing as tolerated  Patient is stable and ready for discharge to SNF ([facility]), just awaiting insurance approval for patient to go. Will follow-up in office in 2 weeks.   Theresa Duty, PA-C Orthopedic Surgery 01/09/2018, 11:01 AM

## 2018-01-09 NOTE — Clinical Social Work Placement (Addendum)
    Patient chose bed at [facility].  LCSW confirmed bed with facility.  Patient has [employer] auth.  Patient will transport by [facility].   LCSW faxed dc docs to facility.   LCSW notified patient's daughter, Benjamine Mola.  RN report #: [id number]    CLINICAL SOCIAL WORK PLACEMENT  NOTE  Date:  01/09/2018  Patient Details  Name: Cassandra Sims MRN: [id number] Date of Birth: Dec 14, 1932  Clinical Social Work is seeking post-discharge placement for this patient at the   level of care (*CSW will initial, date and re-position this form in  chart as items are completed):      Patient/family provided with [facility] Work Department's list of facilities offering this level of care within the geographic area requested by the patient (or if unable, by the patient's family).      Patient/family informed of their freedom to choose among providers that offer the needed level of care, that participate in Medicare, Medicaid or managed care program needed by the patient, have an available bed and are willing to accept the patient.      Patient/family informed of [facility]'s ownership interest in [facility] and [facility], as well as of the fact that they are under no obligation to receive care at these facilities.  PASRR submitted to EDS on       PASRR number received on       Existing PASRR number confirmed on       FL2 transmitted to all facilities in geographic area requested by pt/family on       FL2 transmitted to all facilities within larger geographic area on       Patient informed that his/her managed care company has contracts with or will negotiate with certain facilities, including the following:            Patient/family informed of bed offers received.  Patient chooses bed at       Physician recommends and patient chooses bed at      Patient to be transferred to   on  .  Patient to be transferred to facility by       Patient family notified on   of  transfer.  Name of family member notified:        PHYSICIAN       Additional Comment:    _______________________________________________ Servando Snare, LCSW 01/09/2018, 11:47 AM

## 2018-01-13 ENCOUNTER — Emergency Department (HOSPITAL_COMMUNITY): Payer: Medicare HMO

## 2018-01-13 ENCOUNTER — Encounter (HOSPITAL_COMMUNITY): Payer: Self-pay

## 2018-01-13 ENCOUNTER — Other Ambulatory Visit: Payer: Self-pay

## 2018-01-13 ENCOUNTER — Inpatient Hospital Stay (HOSPITAL_COMMUNITY)
Admission: EM | Admit: 2018-01-13 | Discharge: 2018-01-19 | DRG: 641 | Disposition: A | Discharge status: Skilled Nursing Facility | Payer: Medicare HMO | Source: Skilled Nursing Facility | Attending: Internal Medicine | Admitting: Internal Medicine

## 2018-01-13 DIAGNOSIS — E860 Dehydration: Secondary | ICD-10-CM | POA: Diagnosis present

## 2018-01-13 DIAGNOSIS — I10 Essential (primary) hypertension: Secondary | ICD-10-CM | POA: Diagnosis present

## 2018-01-13 DIAGNOSIS — D638 Anemia in other chronic diseases classified elsewhere: Secondary | ICD-10-CM | POA: Diagnosis present

## 2018-01-13 DIAGNOSIS — E861 Hypovolemia: Secondary | ICD-10-CM | POA: Diagnosis present

## 2018-01-13 DIAGNOSIS — M1990 Unspecified osteoarthritis, unspecified site: Secondary | ICD-10-CM | POA: Diagnosis present

## 2018-01-13 DIAGNOSIS — Z90710 Acquired absence of both cervix and uterus: Secondary | ICD-10-CM | POA: Diagnosis not present

## 2018-01-13 DIAGNOSIS — Z885 Allergy status to narcotic agent status: Secondary | ICD-10-CM

## 2018-01-13 DIAGNOSIS — H409 Unspecified glaucoma: Secondary | ICD-10-CM | POA: Diagnosis present

## 2018-01-13 DIAGNOSIS — J45991 Cough variant asthma: Secondary | ICD-10-CM | POA: Diagnosis present

## 2018-01-13 DIAGNOSIS — E876 Hypokalemia: Secondary | ICD-10-CM | POA: Diagnosis present

## 2018-01-13 DIAGNOSIS — Z803 Family history of malignant neoplasm of breast: Secondary | ICD-10-CM

## 2018-01-13 DIAGNOSIS — K219 Gastro-esophageal reflux disease without esophagitis: Secondary | ICD-10-CM | POA: Diagnosis present

## 2018-01-13 DIAGNOSIS — J329 Chronic sinusitis, unspecified: Secondary | ICD-10-CM | POA: Diagnosis present

## 2018-01-13 DIAGNOSIS — E871 Hypo-osmolality and hyponatremia: Principal | ICD-10-CM | POA: Diagnosis present

## 2018-01-13 DIAGNOSIS — R9431 Abnormal electrocardiogram [ECG] [EKG]: Secondary | ICD-10-CM

## 2018-01-13 DIAGNOSIS — Z836 Family history of other diseases of the respiratory system: Secondary | ICD-10-CM

## 2018-01-13 DIAGNOSIS — Z880 Allergy status to penicillin: Secondary | ICD-10-CM | POA: Diagnosis not present

## 2018-01-13 DIAGNOSIS — Z7982 Long term (current) use of aspirin: Secondary | ICD-10-CM

## 2018-01-13 DIAGNOSIS — Z881 Allergy status to other antibiotic agents status: Secondary | ICD-10-CM

## 2018-01-13 DIAGNOSIS — Z91011 Allergy to milk products: Secondary | ICD-10-CM

## 2018-01-13 DIAGNOSIS — Z882 Allergy status to sulfonamides status: Secondary | ICD-10-CM

## 2018-01-13 DIAGNOSIS — Z96651 Presence of right artificial knee joint: Secondary | ICD-10-CM | POA: Diagnosis not present

## 2018-01-13 DIAGNOSIS — Z83511 Family history of glaucoma: Secondary | ICD-10-CM | POA: Diagnosis not present

## 2018-01-13 DIAGNOSIS — Z9981 Dependence on supplemental oxygen: Secondary | ICD-10-CM | POA: Diagnosis not present

## 2018-01-13 DIAGNOSIS — Z7951 Long term (current) use of inhaled steroids: Secondary | ICD-10-CM

## 2018-01-13 DIAGNOSIS — Z8261 Family history of arthritis: Secondary | ICD-10-CM

## 2018-01-13 DIAGNOSIS — Z96659 Presence of unspecified artificial knee joint: Secondary | ICD-10-CM

## 2018-01-13 DIAGNOSIS — Z79899 Other long term (current) drug therapy: Secondary | ICD-10-CM | POA: Diagnosis not present

## 2018-01-13 DIAGNOSIS — Z833 Family history of diabetes mellitus: Secondary | ICD-10-CM | POA: Diagnosis not present

## 2018-01-13 DIAGNOSIS — Z9049 Acquired absence of other specified parts of digestive tract: Secondary | ICD-10-CM

## 2018-01-13 DIAGNOSIS — R0902 Hypoxemia: Secondary | ICD-10-CM | POA: Diagnosis not present

## 2018-01-13 DIAGNOSIS — Z888 Allergy status to other drugs, medicaments and biological substances status: Secondary | ICD-10-CM

## 2018-01-13 DIAGNOSIS — Z91048 Other nonmedicinal substance allergy status: Secondary | ICD-10-CM

## 2018-01-13 DIAGNOSIS — I4581 Long QT syndrome: Secondary | ICD-10-CM | POA: Diagnosis present

## 2018-01-13 DIAGNOSIS — R531 Weakness: Secondary | ICD-10-CM | POA: Diagnosis not present

## 2018-01-13 HISTORY — DX: Abnormal electrocardiogram (ECG) (EKG): R94.31

## 2018-01-13 HISTORY — DX: Presence of unspecified artificial knee joint: Z96.659

## 2018-01-13 LAB — COMPREHENSIVE METABOLIC PANEL
ALK PHOS: 108 U/L (ref 38–126)
ALT: 44 U/L (ref 0–44)
AST: 29 U/L (ref 15–41)
Albumin: 2.9 g/dL — ABNORMAL LOW (ref 3.5–5.0)
Anion gap: 11 (ref 5–15)
BUN: 20 mg/dL (ref 8–23)
CALCIUM: 8.6 mg/dL — AB (ref 8.9–10.3)
CHLORIDE: 73 mmol/L — AB (ref 98–111)
CO2: 33 mmol/L — ABNORMAL HIGH (ref 22–32)
CREATININE: 0.69 mg/dL (ref 0.44–1.00)
Glucose, Bld: 136 mg/dL — ABNORMAL HIGH (ref 70–99)
Potassium: 2.2 mmol/L — CL (ref 3.5–5.1)
Sodium: 117 mmol/L — CL (ref 135–145)
TOTAL PROTEIN: 5.7 g/dL — AB (ref 6.5–8.1)
Total Bilirubin: 1.5 mg/dL — ABNORMAL HIGH (ref 0.3–1.2)

## 2018-01-13 LAB — CBC
HEMATOCRIT: 28.1 % — AB (ref 36.0–46.0)
Hemoglobin: 10 g/dL — ABNORMAL LOW (ref 12.0–15.0)
MCH: 30.6 pg (ref 26.0–34.0)
MCHC: 35.6 g/dL (ref 30.0–36.0)
MCV: 85.9 fL (ref 78.0–100.0)
Platelets: 315 10*3/uL (ref 150–400)
RBC: 3.27 MIL/uL — ABNORMAL LOW (ref 3.87–5.11)
RDW: 12.7 % (ref 11.5–15.5)
WBC: 10.8 10*3/uL — AB (ref 4.0–10.5)

## 2018-01-13 LAB — URINALYSIS, ROUTINE W REFLEX MICROSCOPIC
Bilirubin Urine: NEGATIVE
GLUCOSE, UA: NEGATIVE mg/dL
HGB URINE DIPSTICK: NEGATIVE
Ketones, ur: NEGATIVE mg/dL
LEUKOCYTES UA: NEGATIVE
Nitrite: NEGATIVE
PROTEIN: NEGATIVE mg/dL
SPECIFIC GRAVITY, URINE: 1.006 (ref 1.005–1.030)
pH: 7 (ref 5.0–8.0)

## 2018-01-13 LAB — MAGNESIUM: MAGNESIUM: 1.9 mg/dL (ref 1.7–2.4)

## 2018-01-13 LAB — SODIUM, URINE, RANDOM: Sodium, Ur: <10 mmol/L

## 2018-01-13 LAB — I-STAT CG4 LACTIC ACID, ED: LACTIC ACID, VENOUS: 1.73 mmol/L (ref 0.5–1.9)

## 2018-01-13 LAB — CREATININE, URINE, RANDOM: Creatinine, Urine: 36.35 mg/dL

## 2018-01-13 MED ORDER — OXYCODONE HCL 5 MG PO TABS: 5.0000 mg | ORAL_TABLET | Freq: Four times a day (QID) | ORAL | Status: DC | PRN

## 2018-01-13 MED ORDER — TACROLIMUS 0.1 % EX OINT: TOPICAL_OINTMENT | Freq: Two times a day (BID) | CUTANEOUS | Status: DC

## 2018-01-13 MED ORDER — AZELASTINE HCL 0.1 % NA SOLN
1.0000 | Freq: Two times a day (BID) | NASAL | Status: DC
  Administered 2018-01-13 – 2018-01-19 (×12): 1 via NASAL
  Filled 2018-01-13: qty 30

## 2018-01-13 MED ORDER — POTASSIUM CHLORIDE 10 MEQ/100ML IV SOLN
10.0000 meq | INTRAVENOUS | Status: AC
  Administered 2018-01-13 (×3): 10 meq via INTRAVENOUS
  Filled 2018-01-13 (×3): qty 100

## 2018-01-13 MED ORDER — FLUTICASONE PROPIONATE 50 MCG/ACT NA SUSP
1.0000 | Freq: Two times a day (BID) | NASAL | Status: DC | PRN
  Filled 2018-01-13: qty 16

## 2018-01-13 MED ORDER — SODIUM CHLORIDE 0.9 % IV SOLN
INTRAVENOUS | Status: AC
  Administered 2018-01-13: 17:00:00 via INTRAVENOUS

## 2018-01-13 MED ORDER — ALBUTEROL SULFATE (2.5 MG/3ML) 0.083% IN NEBU: 3.0000 mL | INHALATION_SOLUTION | RESPIRATORY_TRACT | Status: DC | PRN

## 2018-01-13 MED ORDER — MONTELUKAST SODIUM 10 MG PO TABS
10.0000 mg | ORAL_TABLET | Freq: Every day | ORAL | Status: DC
  Administered 2018-01-13 – 2018-01-18 (×6): 10 mg via ORAL
  Filled 2018-01-13 (×6): qty 1

## 2018-01-13 MED ORDER — MOMETASONE FURO-FORMOTEROL FUM 100-5 MCG/ACT IN AERO: 2.0000 | INHALATION_SPRAY | Freq: Two times a day (BID) | RESPIRATORY_TRACT | Status: DC

## 2018-01-13 MED ORDER — MOMETASONE FURO-FORMOTEROL FUM 200-5 MCG/ACT IN AERO
2.0000 | INHALATION_SPRAY | Freq: Two times a day (BID) | RESPIRATORY_TRACT | Status: DC
  Administered 2018-01-14 – 2018-01-18 (×10): 2 via RESPIRATORY_TRACT
  Filled 2018-01-13 (×2): qty 8.8

## 2018-01-13 MED ORDER — VITAMIN D3 25 MCG (1000 UNIT) PO TABS
1000.0000 [IU] | ORAL_TABLET | Freq: Every day | ORAL | Status: DC
  Administered 2018-01-14 – 2018-01-19 (×6): 1000 [IU] via ORAL
  Filled 2018-01-13 (×6): qty 1

## 2018-01-13 MED ORDER — ONDANSETRON HCL 4 MG PO TABS: 4.0000 mg | ORAL_TABLET | Freq: Four times a day (QID) | ORAL | Status: DC | PRN

## 2018-01-13 MED ORDER — LATANOPROST 0.005 % OP SOLN
1.0000 [drp] | Freq: Every day | OPHTHALMIC | Status: DC
  Administered 2018-01-13 – 2018-01-18 (×6): 1 [drp] via OPHTHALMIC
  Filled 2018-01-13: qty 2.5

## 2018-01-13 MED ORDER — ASPIRIN EC 325 MG PO TBEC
325.0000 mg | DELAYED_RELEASE_TABLET | Freq: Two times a day (BID) | ORAL | Status: DC
  Administered 2018-01-13 – 2018-01-19 (×12): 325 mg via ORAL
  Filled 2018-01-13 (×12): qty 1

## 2018-01-13 MED ORDER — ENOXAPARIN SODIUM 40 MG/0.4ML ~~LOC~~ SOLN
40.0000 mg | SUBCUTANEOUS | Status: DC
  Administered 2018-01-13 – 2018-01-18 (×6): 40 mg via SUBCUTANEOUS
  Filled 2018-01-13 (×6): qty 0.4

## 2018-01-13 MED ORDER — POLYVINYL ALCOHOL 1.4 % OP SOLN
1.0000 [drp] | Freq: Every day | OPHTHALMIC | Status: DC | PRN
  Filled 2018-01-13: qty 15

## 2018-01-13 MED ORDER — POTASSIUM CHLORIDE CRYS ER 20 MEQ PO TBCR
40.0000 meq | EXTENDED_RELEASE_TABLET | Freq: Once | ORAL | Status: AC
  Administered 2018-01-13: 40 meq via ORAL
  Filled 2018-01-13: qty 2

## 2018-01-13 MED ORDER — DOCUSATE SODIUM 100 MG PO CAPS
100.0000 mg | ORAL_CAPSULE | Freq: Two times a day (BID) | ORAL | Status: DC
  Administered 2018-01-14 – 2018-01-19 (×10): 100 mg via ORAL
  Filled 2018-01-13 (×10): qty 1

## 2018-01-13 NOTE — ED Notes (Signed)
ED TO INPATIENT HANDOFF REPORT  Name/Age/Gender Cassandra Sims 84 y.o. female  Code Status    Code Status Orders  (From admission, onward)        Start     Ordered   01/13/18 1736  Full code  Continuous     01/13/18 1736    Code Status History    Date Active Date Inactive Code Status Order ID Comments User Context   01/05/2018 1154 01/09/2018 1623 Full Code [id number]  Gaynelle Arabian, MD Inpatient      Home/SNF/Other Nursing Home - short term rehab  Chief Complaint weakness  Level of Care/Admitting Diagnosis ED Disposition    ED Disposition Condition [facility] Area: [place] [518841]  Level of Care: Stepdown [14]  Admit to SDU based on following criteria: Hemodynamic compromise or significant risk of instability:  Patient requiring short term acute titration and management of vasoactive drips, and invasive monitoring (i.e., CVP and Arterial line).  Diagnosis: Hyponatremia [660630]  Admitting Physician: Georgette Shell [[id number]]  Attending Physician: Georgette Shell [[id number]]  Estimated length of stay: 3 - 4 days  Certification:: I certify this patient will need inpatient services for at least 2 midnights  PT Class (Do Not Modify): Inpatient [101]  PT Acc Code (Do Not Modify): Private [1]       Medical History Past Medical History:  Diagnosis Date  . Anxiety   . Arthritis   . Asthma   . Complication of anesthesia    hard to wake up, 2009  sit up in bed to fast and BP dropped low code blue called per pt.  . Family history of adverse reaction to anesthesia    sister has [place]  . GERD (gastroesophageal reflux disease)   . Glaucoma   . Headache    hx of migrains  . HOH (hard of hearing)   . Hypertension   . Hypoglycemia   . Multiple allergies   . Peripheral vascular disease (HCC)    varicose veins , Laser vein surgery  . PONV (postoperative nausea and vomiting)   . Pre-diabetes   . Rhinitis   . Wears  glasses     Allergies Allergies  Allergen Reactions  . Penicillins Shortness Of Breath and Swelling    Has patient had a PCN reaction causing immediate rash, facial/tongue/throat swelling, SOB or lightheadedness with hypotension: Yes Has patient had a PCN reaction causing severe rash involving mucus membranes or skin necrosis: No Has patient had a PCN reaction that required hospitalization: No Has patient had a PCN reaction occurring within the last 10 years: No If all of the above answers are "NO", then may proceed with Cephalosporin use.   . Adhesive [Tape] Itching    BAND-AID TAPE-skin redness/itching  . Clindamycin/Lincomycin Other (See Comments)    Severe joint pain.  . Codeine Nausea And Vomiting  . Darvocet [Propoxyphene N-Acetaminophen] Other (See Comments)    HTN  . Dilaudid [Hydromorphone Hcl] Nausea And Vomiting    DROP BLOOD PRESSURE HYPOTENSIVE  . Doxycycline Other (See Comments)    Pt states she tolerates OKAY  . Flagyl [Metronidazole] Other (See Comments)    Joint pains  . Lactose Intolerance (Gi)   . Levofloxacin     Joint pain  . Macrodantin [Nitrofurantoin Macrocrystal]   . Morphine And Related     hallucinate  . Premarin [Conjugated Estrogens] Itching  . Tramadol Hcl Other (See Comments)    Causes insomnia  . Zantac [  Ranitidine Hcl]     Itching   . Zofran [Ondansetron Hcl]     HTN  . Nitrofuran Derivatives Rash  . Sulfa Antibiotics Rash    IV Location/Drains/Wounds Patient Lines/Drains/Airways Status   Active Line/Drains/Airways    Name:   Placement date:   Placement time:   Site:   Days:   Peripheral IV 01/13/18 Right Antecubital   01/13/18    1610    Antecubital   less than 1   External Urinary Catheter   01/13/18    1521    -   less than 1   Incision (Closed) 01/05/18 Knee Right   01/05/18    1016     8          Labs/Imaging Results for orders placed or performed during the hospital encounter of 01/13/18 (from the past 48 hour(s))   Comprehensive metabolic panel     Status: Abnormal   Collection Time: 01/13/18  3:00 PM  Result Value Ref Range   Sodium 117 (LL) 135 - 145 mmol/L    Comment: CRITICAL RESULT CALLED TO, READ BACK BY AND VERIFIED WITH: BRILL,M. RN '@[id number]'  ON 06.25.19 BY COHEN,K    Potassium 2.2 (LL) 3.5 - 5.1 mmol/L    Comment: CRITICAL RESULT CALLED TO, READ BACK BY AND VERIFIED WITH: BRILL,M. RN '@[id number]'  ON 06.25.19 BY COHEN,K    Chloride 73 (L) 98 - 111 mmol/L    Comment: Please note change in reference range.   CO2 33 (H) 22 - 32 mmol/L   Glucose, Bld 136 (H) 70 - 99 mg/dL    Comment: Please note change in reference range.   BUN 20 8 - 23 mg/dL    Comment: Please note change in reference range.   Creatinine, Ser 0.69 0.44 - 1.00 mg/dL   Calcium 8.6 (L) 8.9 - 10.3 mg/dL   Total Protein 5.7 (L) 6.5 - 8.1 g/dL   Albumin 2.9 (L) 3.5 - 5.0 g/dL   AST 29 15 - 41 U/L   ALT 44 0 - 44 U/L    Comment: Please note change in reference range.   Alkaline Phosphatase 108 38 - 126 U/L   Total Bilirubin 1.5 (H) 0.3 - 1.2 mg/dL   GFR calc non Af Amer >60 >60 mL/min   GFR calc Af Amer >60 >60 mL/min    Comment: (NOTE) The eGFR has been calculated using the CKD EPI equation. This calculation has not been validated in all clinical situations. eGFR's persistently <60 mL/min signify possible Chronic Kidney Disease.    Anion gap 11 5 - 15    Comment: Performed at [facility], [place] [address] [place], [place] [address]  Magnesium     Status: None   Collection Time: 01/13/18  3:00 PM  Result Value Ref Range   Magnesium 1.9 1.7 - 2.4 mg/dL    Comment: Performed at [facility], [place] [address] [place], [place] [address]  CBC     Status: Abnormal   Collection Time: 01/13/18  3:00 PM  Result Value Ref Range   WBC 10.8 (H) 4.0 - 10.5 K/uL   RBC 3.27 (L) 3.87 - 5.11 MIL/uL   Hemoglobin 10.0 (L) 12.0 - 15.0 g/dL   HCT 28.1 (L) 36.0 - 46.0 %   MCV 85.9 78.0 - 100.0 fL   MCH  30.6 26.0 - 34.0 pg   MCHC 35.6 30.0 - 36.0 g/dL   RDW 12.7 11.5 - 15.5 %   Platelets  315 150 - 400 K/uL    Comment: Performed at [facility], [place] [address] [place], [place] [address]  I-Stat CG4 Lactic Acid, ED     Status: None   Collection Time: 01/13/18  3:10 PM  Result Value Ref Range   Lactic Acid, Venous 1.73 0.5 - 1.9 mmol/L   Dg Chest 2 View  Result Date: 01/13/2018 CLINICAL DATA:  Weakness and confusion following knee surgery 2 days ago. EXAM: CHEST - 2 VIEW COMPARISON:  09/12/2017 FINDINGS: Chronic elevation of the left hemidiaphragm. Chronic volume loss at both lung bases secondary to that. Volume loss at the right base may be slightly above baseline. The upper lungs are clear. No acute bone finding. IMPRESSION: Chronic elevation of the left hemidiaphragm with chronic left lower lung volume loss. Mild volume loss at the right lung base, probably slightly increased above baseline. Electronically Signed   By: Nelson Chimes M.D.   On: 01/13/2018 15:53    Pending Labs Unresulted Labs (From admission, onward)   Start     Ordered   01/13/18 1603  Creatinine, urine, random  STAT,   STAT     01/13/18 1602   01/13/18 1602  Sodium, urine, random  Once,   R     01/13/18 1602   01/13/18 1506  Urinalysis, Routine w reflex microscopic  STAT,   STAT     01/13/18 1505      Vitals/Pain Today's Vitals   01/13/18 1538 01/13/18 1600 01/13/18 1630 01/13/18 1700  BP: 120/66 129/72 137/70 119/63  Pulse: 69 64 68 70  Resp: '16 14 17 20  ' Temp:      TempSrc:      SpO2: 98% 98% 98% 97%  Weight:      Height:      PainSc:        Isolation Precautions No active isolations  Medications Medications  potassium chloride 10 mEq in 100 mL IVPB (0 mEq Intravenous Stopped 01/13/18 1718)  0.9 %  sodium chloride infusion ( Intravenous New Bag/Given 01/13/18 1718)  enoxaparin (LOVENOX) injection 40 mg (has no administration in time range)  potassium chloride SA (K-DUR,KLOR-CON)  CR tablet 40 mEq (40 mEq Oral Given 01/13/18 1619)    Mobility walks with person assist- recent knee surgery

## 2018-01-13 NOTE — ED Notes (Addendum)
Purewick placed. Pt aware UA needed. 

## 2018-01-13 NOTE — ED Triage Notes (Signed)
Pt BIBA from [facility]. Lab draw at facility showed low potassium and sodium levels. Pt had knee surgery 2 days ago. Bloodwork was run twice to confirm.

## 2018-01-13 NOTE — ED Notes (Signed)
ICU unable to take report at this time.

## 2018-01-13 NOTE — ED Provider Notes (Signed)
Patient care transferred to me.  The patient's sodium has resulted in is 117.  The potassium is 2.2.  She does not look overtly dehydrated or fluid overloaded.  This is likely multifactorial.  She has been slightly confused and progressively weak but is not specifically altered otherwise and does not appear ill.  Oral and IV potassium repletion has started.  Magnesium is normal.  QTC is mildly prolonged just over 500.   4:52 PM D/w Dr. Zigmund Daniel. Asks for NS 150/hr for now, she will then decrease. Will admit.     Results for orders placed or performed during the hospital encounter of 01/13/18  Comprehensive metabolic panel  Result Value Ref Range   Sodium 117 (LL) 135 - 145 mmol/L   Potassium 2.2 (LL) 3.5 - 5.1 mmol/L   Chloride 73 (L) 98 - 111 mmol/L   CO2 33 (H) 22 - 32 mmol/L   Glucose, Bld 136 (H) 70 - 99 mg/dL   BUN 20 8 - 23 mg/dL   Creatinine, Ser 0.69 0.44 - 1.00 mg/dL   Calcium 8.6 (L) 8.9 - 10.3 mg/dL   Total Protein 5.7 (L) 6.5 - 8.1 g/dL   Albumin 2.9 (L) 3.5 - 5.0 g/dL   AST 29 15 - 41 U/L   ALT 44 0 - 44 U/L   Alkaline Phosphatase 108 38 - 126 U/L   Total Bilirubin 1.5 (H) 0.3 - 1.2 mg/dL   GFR calc non Af Amer >60 >60 mL/min   GFR calc Af Amer >60 >60 mL/min   Anion gap 11 5 - 15  Magnesium  Result Value Ref Range   Magnesium 1.9 1.7 - 2.4 mg/dL  CBC  Result Value Ref Range   WBC 10.8 (H) 4.0 - 10.5 K/uL   RBC 3.27 (L) 3.87 - 5.11 MIL/uL   Hemoglobin 10.0 (L) 12.0 - 15.0 g/dL   HCT 28.1 (L) 36.0 - 46.0 %   MCV 85.9 78.0 - 100.0 fL   MCH 30.6 26.0 - 34.0 pg   MCHC 35.6 30.0 - 36.0 g/dL   RDW 12.7 11.5 - 15.5 %   Platelets 315 150 - 400 K/uL  I-Stat CG4 Lactic Acid, ED  Result Value Ref Range   Lactic Acid, Venous 1.73 0.5 - 1.9 mmol/L   Dg Chest 2 View  Result Date: 01/13/2018 CLINICAL DATA:  Weakness and confusion following knee surgery 2 days ago. EXAM: CHEST - 2 VIEW COMPARISON:  09/12/2017 FINDINGS: Chronic elevation of the left hemidiaphragm. Chronic  volume loss at both lung bases secondary to that. Volume loss at the right base may be slightly above baseline. The upper lungs are clear. No acute bone finding. IMPRESSION: Chronic elevation of the left hemidiaphragm with chronic left lower lung volume loss. Mild volume loss at the right lung base, probably slightly increased above baseline. Electronically Signed   By: Nelson Chimes M.D.   On: 01/13/2018 15:53     CRITICAL CARE Performed by: Ephraim Hamburger   Total critical care time: 30 minutes  Critical care time was exclusive of separately billable procedures and treating other patients.  Critical care was necessary to treat or prevent imminent or life-threatening deterioration.  Critical care was time spent personally by me on the following activities: development of treatment plan with patient and/or surrogate as well as nursing, discussions with consultants, evaluation of patient's response to treatment, examination of patient, obtaining history from patient or surrogate, ordering and performing treatments and interventions, ordering and review of laboratory studies,  ordering and review of radiographic studies, pulse oximetry and re-evaluation of patient's condition.    Sherwood Gambler, MD 01/13/18 [phone]
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
## 2018-01-13 NOTE — ED Provider Notes (Signed)
[facility] DEPT Provider Note   CSN: [id number] Arrival date & time: 01/13/18  1409     History   Chief Complaint Chief Complaint  Patient presents with  . Abnormal Lab    HPI Cassandra Sims is a 84 y.o. female.  84 year old female with past medical history including hypertension, recent right knee replacement who presents with weakness and confusion as well as abnormal lab work.  Daughter states that she recently had a right knee replacement and was discharged from the hospital to a rehab facility 4 days ago.  Since arriving to rehab, she has had a progressive decline.  Daughter notes that she has had generalized weakness and fatigue, laying around and not acting like herself.  She notes that yesterday she started having some confusion.  She had some nausea and vomiting a few days ago while she was taking pain medications that seem to improve after receiving nausea medications.  Daughter notes that she seemed to have some labored breathing yesterday.  At the rehab facility today, they obtained lab work which showed low sodium and low potassium, which is why she was sent here for further evaluation.  She has not had any significant cough or fevers.  No drainage or problems at the surgical site.  Patient herself denies any shortness of breath or other areas of pain.  She denies dysuria.  LEVEL 5 CAVEAT DUE TO AMS  The history is provided by the patient and a relative.  Abnormal Lab    Past Medical History:  Diagnosis Date  . Anxiety   . Arthritis   . Asthma   . Complication of anesthesia    hard to wake up, 2009  sit up in bed to fast and BP dropped low code blue called per pt.  . Family history of adverse reaction to anesthesia    sister has [place]  . GERD (gastroesophageal reflux disease)   . Glaucoma   . Headache    hx of migrains  . HOH (hard of hearing)   . Hypertension   . Hypoglycemia   . Multiple allergies   . Peripheral vascular  disease (HCC)    varicose veins , Laser vein surgery  . PONV (postoperative nausea and vomiting)   . Pre-diabetes   . Rhinitis   . Wears glasses     Patient Active Problem List   Diagnosis Date Noted  . OA (osteoarthritis) of knee 01/05/2018  . GERD (gastroesophageal reflux disease) 07/03/2016  . Hemidiaphragm paralysis 07/03/2016  . Nodule of right lung 06/19/2016  . Essential hypertension 03/29/2016  . Glaucoma 03/29/2016  . Arthritis 03/29/2016  . Sinusitis, chronic 11/25/2012  . Cough variant asthma 11/25/2012  . Cough 11/24/2012    Past Surgical History:  Procedure Laterality Date  . ABDOMINAL HYSTERECTOMY    . APPENDECTOMY    . arthroscopy right knee    . BREAST SURGERY     biopsy left   . COLECTOMY  2008   obstruction  . DIAGNOSTIC LAPAROSCOPY     exp  . DILATION AND CURETTAGE OF UTERUS    . EYE SURGERY     cataract   bil and retina surgery right eye, glaucoma surgery, right eye  . FUNCTIONAL ENDOSCOPIC SINUS SURGERY  2009   bilat ethm,frontal,max  . JOINT REPLACEMENT     right total knee arthroplasty 01-05-18 aluisio  . NASAL SINUS SURGERY Bilateral 10/26/2012   Procedure: BILATERAL ENDOSCOPIC REVISION ETHMOID MAXILLARY AND FRONTAL SINUS SURGERY  ;  Surgeon: Izora Gala, MD;  Location: [facility];  Service: ENT;  Laterality: Bilateral;  . OVARIAN CYST SURGERY    . TOTAL KNEE ARTHROPLASTY Right 01/05/2018   Procedure: RIGHT TOTAL KNEE ARTHROPLASTY;  Surgeon: Gaynelle Arabian, MD;  Location: [facility];  Service: Orthopedics;  Laterality: Right;  . TUBAL LIGATION    . URETER SURGERY     cut accidentally during exp lap-ovarien cyst  . VEIN LIGATION       OB History   None      Home Medications    Prior to Admission medications   Medication Sig Start Date End Date Taking? Authorizing Provider  albuterol (PROVENTIL HFA;VENTOLIN HFA) 108 (90 BASE) MCG/ACT inhaler Inhale 2 puffs into the lungs every 4 (four) hours as needed for wheezing. Reported on  10/10/2015   Yes [provider]  amLODipine (NORVASC) 5 MG tablet Take 5 mg by mouth every evening.  01/29/17  Yes [provider]  Azelastine HCl 0.15 % SOLN Place 1 spray into both nostrils 2 (two) times daily. 08/26/17  Yes Kozlow, Donnamarie Poag, MD  bimatoprost (LUMIGAN) 0.01 % SOLN Place 1 drop into both eyes nightly. 11/17/17 11/17/18 Yes [provider]  budesonide-formoterol (SYMBICORT) 160-4.5 MCG/ACT inhaler Inhale two puffs twice daily to prevent cough or wheeze. 11/27/17  Yes Kozlow, Donnamarie Poag, MD  Calcium Carb-Cholecalciferol (CALTRATE 600+D3 PO) Take 1 tablet by mouth daily.   Yes [provider]  cholecalciferol (VITAMIN D) 1000 UNITS tablet Take 1,000 Units by mouth daily.   Yes [provider]  docusate sodium (COLACE) 100 MG capsule Take 1 capsule (100 mg total) by mouth 2 (two) times daily. 01/07/18  Yes Edmisten, Kristie L, PA  Lactobacillus (ACIDOPHILUS PO) Take 1 capsule by mouth daily.   Yes [provider]  methocarbamol (ROBAXIN) 500 MG tablet Take 1 tablet (500 mg total) by mouth every 6 (six) hours as needed for muscle spasms. 01/07/18  Yes Edmisten, Kristie L, PA  metoCLOPramide (REGLAN) 5 MG tablet Take 1-2 tablets (5-10 mg total) by mouth every 8 (eight) hours as needed for nausea (if ondansetron (ZOFRAN) ineffective.). 01/07/18  Yes Edmisten, Kristie L, PA  Misc Natural Products (LUTEIN 20) CAPS Take 20 mg by mouth daily.    Yes [provider]  mometasone (ELOCON) 0.1 % ointment Apply topically 2 (two) times daily. Patient taking differently: Apply 1 application topically 2 (two) times daily as needed (for itching.).  01/16/17  Yes Kozlow, Donnamarie Poag, MD  mometasone-formoterol [facility]) 100-5 MCG/ACT AERO Take 2 puffs first thing in am and then another 2 puffs about 12 hours later. Patient taking differently: Inhale 2 puffs into the lungs 2 (two) times daily.  04/01/13  Yes Tanda Rockers, MD  montelukast (SINGULAIR) 10 MG tablet  Take 10 mg by mouth at bedtime.   Yes [provider]  Multiple Vitamin (MULTIVITAMIN WITH MINERALS) TABS tablet Take 1 tablet by mouth daily. Nature Made Multi Vitamin for Her 50+ W/No Iron   Yes [provider]  NONFORMULARY OR COMPOUNDED ITEM Place 1 drop into both eyes 4 (four) times daily. Autologous serum eye drops (ASED) made specifically for patient from patient's own blood serum and plasma   Yes [provider]  oxyCODONE (OXY IR/ROXICODONE) 5 MG immediate release tablet Take 1 tablet (5 mg total) by mouth every 6 (six) hours as needed for moderate pain or severe pain. 01/09/18  Yes Edmisten, Kristie L, PA  OXYGEN Inhale into the lungs. 2  liters for shortness of breath or chest pain   Yes [provider]  psyllium (METAMUCIL) 58.6 % powder Take by mouth daily. 14.3 g (1 heaping tablespoon) every morning.   Yes [provider]  tacrolimus (PROTOPIC) 0.1 % ointment Apply topically 2 (two) times daily. Patient taking differently: Apply 1 application topically 2 (two) times daily as needed (for itching.).  01/16/17  Yes Kozlow, Donnamarie Poag, MD  telmisartan-hydrochlorothiazide (MICARDIS HCT) 80-25 MG tablet Take 1 tablet by mouth daily. 11/25/17  Yes [provider]  traMADol (ULTRAM) 50 MG tablet Take 1-2 tablets (50-100 mg total) by mouth every 6 (six) hours as needed for moderate pain (Give second, if unresponsive to oxycodone.). 01/07/18  Yes Edmisten, Kristie L, PA  Turmeric 450 MG CAPS Take 450 mg by mouth daily.   Yes [provider]  aspirin 325 MG EC tablet Take 1 tablet (325 mg total) by mouth 2 (two) times daily for 17 days. Take one tablet (325 mg) Aspirin two times a day for three weeks following surgery. Then take one baby Aspirin (81 mg) once a day for three weeks. Then discontinue aspirin. 01/09/18 01/26/18  Edmisten, Ok Anis, PA  aspirin EC 81 MG tablet Take 81 mg by mouth daily.    [provider]  Benralizumab  (FASENRA) 30 MG/ML SOSY Inject 30 mg into the skin every 8 (eight) weeks.    [provider]  bisacodyl (DULCOLAX) 10 MG suppository Place 1 suppository (10 mg total) rectally daily as needed for moderate constipation. 01/07/18   Edmisten, Ok Anis, PA  carboxymethylcellulose (REFRESH TEARS) 0.5 % SOLN Place 1 drop into both eyes 5 (five) times daily as needed (for dry eyes).    [provider]  fluticasone (FLONASE) 50 MCG/ACT nasal spray Place 1 spray into both nostrils 2 (two) times daily as needed for allergies.     [provider]  mometasone-formoterol (DULERA) 200-5 MCG/ACT AERO Inhale 2 puffs into the lungs 2 (two) times daily. 11/26/17   Kozlow, Donnamarie Poag, MD  ondansetron (ZOFRAN) 4 MG tablet Take 1 tablet (4 mg total) by mouth every 6 (six) hours as needed for nausea. 01/07/18   Edmisten, Kristie L, PA  polyethylene glycol (MIRALAX / GLYCOLAX) packet Take 17 g by mouth daily as needed for mild constipation. 01/07/18   Edmisten, Kristie L, PA  sodium phosphate (FLEET) 7-19 GM/118ML ENEM Place 133 mLs (1 enema total) rectally once as needed for severe constipation. 01/07/18   Edmisten, Kristie L, PA  Undecylenic Acid (FUNGI-NAIL EX) Apply 1 application topically daily. Applied to affected toe nail daily    [provider]    Family History Family History  Problem Relation Age of Onset  . Glaucoma Mother   . Arthritis Mother   . Lung disease Sister   . Breast cancer Daughter   . Diabetes Mellitus I Other     Social History Social History   Tobacco Use  . Smoking status: Never Smoker  . Smokeless tobacco: Never Used  Substance Use Topics  . Alcohol use: No  . Drug use: No     Allergies   Penicillins; Adhesive [tape]; Clindamycin/lincomycin; Codeine; Darvocet [propoxyphene n-acetaminophen]; Dilaudid [hydromorphone hcl]; Doxycycline; Flagyl [metronidazole]; Lactose intolerance (gi); Levofloxacin; Macrodantin [nitrofurantoin macrocrystal]; Morphine and  related; Premarin [conjugated estrogens]; Tramadol hcl; Zantac [ranitidine hcl]; Zofran [ondansetron hcl]; Nitrofuran derivatives; and Sulfa antibiotics   Review of Systems Review of Systems  Unable to perform ROS: Mental status change     Physical  Exam Updated Vital Signs BP 132/66 (BP Location: Right Arm)   Pulse 68   Temp 98.3 F (36.8 C) (Oral)   Resp 14   Ht 5' 2.5" (1.588 m)   Wt 54 kg (119 lb)   SpO2 97%   BMI 21.42 kg/m   Physical Exam  Constitutional: She appears well-developed and well-nourished. No distress.  HENT:  Head: Normocephalic and atraumatic.  Moist mucous membranes  Eyes: Conjunctivae are normal.  Neck: Neck supple.  Cardiovascular: Normal rate, regular rhythm and normal heart sounds.  No murmur heard. Pulmonary/Chest: Effort normal and breath sounds normal.  Abdominal: Soft. Bowel sounds are normal. She exhibits no distension. There is no tenderness.  Musculoskeletal: She exhibits edema.  Edema and healing ecchymosis R knee with surgical incision site clean/dry/intact and steristrips in place, non tender  Neurological: She is alert.  Oriented to person and place  Skin: Skin is warm and dry.  Psychiatric:  Flat affect  Nursing note and vitals reviewed.    ED Treatments / Results  Labs (all labs ordered are listed, but only abnormal results are displayed) Labs Reviewed  COMPREHENSIVE METABOLIC PANEL  MAGNESIUM  CBC  URINALYSIS, ROUTINE W REFLEX MICROSCOPIC  I-STAT CG4 LACTIC ACID, ED    EKG EKG Interpretation  Date/Time:  Tuesday January 13 2018 14:33:02 EDT Ventricular Rate:  67 PR Interval:    QRS Duration: 108 QT Interval:  507 QTC Calculation: 536 R Axis:   53 Text Interpretation:  Sinus rhythm Borderline T abnormalities, anterior leads Prolonged QT interval borderline ST changes in I and II vs artifact from wandering baseline Confirmed by Cassandra Sims [phone]) on 01/13/2018 3:09:24 PM   Radiology No results  found.  Procedures Procedures (including critical care time)  Medications Ordered in ED Medications - No data to display   Initial Impression / Assessment and Plan / ED Course  I have reviewed the triage vital signs and the nursing notes.  Pertinent labs & imaging results that were available during my care of the patient were reviewed by me and considered in my medical decision making (see chart for details).    PT seemed mildly confused on exam but was alert, protecting airway, no acute distress.  Her surgical site seems to be healing appropriately on visual exam.  Vital signs unremarkable.  Lactate normal, WBC 10.8, Hgb 10.  The remainder of her lab work and chest x-ray are pending.  I am signing out to the oncoming provider.  If the patient has worsening hyponatremia compared to recent lab work, she may require admission for treatment.  Final Clinical Impressions(s) / ED Diagnoses   Final diagnoses:  None    ED Discharge Orders    None       Lorree Millar, Wenda Overland, MD 01/13/18 1552

## 2018-01-14 DIAGNOSIS — I10 Essential (primary) hypertension: Secondary | ICD-10-CM

## 2018-01-14 DIAGNOSIS — E876 Hypokalemia: Secondary | ICD-10-CM

## 2018-01-14 DIAGNOSIS — Z96651 Presence of right artificial knee joint: Secondary | ICD-10-CM

## 2018-01-14 DIAGNOSIS — E871 Hypo-osmolality and hyponatremia: Principal | ICD-10-CM

## 2018-01-14 DIAGNOSIS — K219 Gastro-esophageal reflux disease without esophagitis: Secondary | ICD-10-CM

## 2018-01-14 DIAGNOSIS — R9431 Abnormal electrocardiogram [ECG] [EKG]: Secondary | ICD-10-CM

## 2018-01-14 LAB — BASIC METABOLIC PANEL
Anion gap: 9 (ref 5–15)
Anion gap: 8 (ref 5–15)
BUN: 13 mg/dL (ref 8–23)
BUN: 14 mg/dL (ref 8–23)
CHLORIDE: 87 mmol/L — AB (ref 98–111)
CHLORIDE: 88 mmol/L — AB (ref 98–111)
CO2: 30 mmol/L (ref 22–32)
CO2: 29 mmol/L (ref 22–32)
CREATININE: 0.49 mg/dL (ref 0.44–1.00)
Calcium: 8.2 mg/dL — ABNORMAL LOW (ref 8.9–10.3)
Calcium: 7.4 mg/dL — ABNORMAL LOW (ref 8.9–10.3)
Creatinine, Ser: 0.4 mg/dL — ABNORMAL LOW (ref 0.44–1.00)
GFR calc non Af Amer: >60 mL/min (ref >60)
GFR calc non Af Amer: >60 mL/min (ref >60)
GLUCOSE: 104 mg/dL — AB (ref 70–99)
Glucose, Bld: 154 mg/dL — ABNORMAL HIGH (ref 70–99)
Potassium: 2.8 mmol/L — ABNORMAL LOW (ref 3.5–5.1)
Potassium: 3.3 mmol/L — ABNORMAL LOW (ref 3.5–5.1)
Sodium: 126 mmol/L — ABNORMAL LOW (ref 135–145)
Sodium: 125 mmol/L — ABNORMAL LOW (ref 135–145)

## 2018-01-14 LAB — CBC
HCT: 27.6 % — ABNORMAL LOW (ref 36.0–46.0)
HEMOGLOBIN: 9.8 g/dL — AB (ref 12.0–15.0)
MCH: 30.5 pg (ref 26.0–34.0)
MCHC: 35.5 g/dL (ref 30.0–36.0)
MCV: 86 fL (ref 78.0–100.0)
Platelets: 302 10*3/uL (ref 150–400)
RBC: 3.21 MIL/uL — AB (ref 3.87–5.11)
RDW: 13 % (ref 11.5–15.5)
WBC: 12.3 10*3/uL — ABNORMAL HIGH (ref 4.0–10.5)

## 2018-01-14 LAB — MAGNESIUM: Magnesium: 1.7 mg/dL (ref 1.7–2.4)

## 2018-01-14 MED ORDER — CALCIUM CARBONATE-VITAMIN D 500-200 MG-UNIT PO TABS
ORAL_TABLET | Freq: Every day | ORAL | Status: DC
  Administered 2018-01-14 – 2018-01-15 (×2): via ORAL
  Administered 2018-01-16 – 2018-01-19 (×4): 1 via ORAL
  Filled 2018-01-14 (×6): qty 1

## 2018-01-14 MED ORDER — PROSIGHT PO TABS
1.0000 | ORAL_TABLET | Freq: Every day | ORAL | Status: DC
  Administered 2018-01-14 – 2018-01-19 (×6): 1 via ORAL
  Filled 2018-01-14 (×6): qty 1

## 2018-01-14 MED ORDER — LORAZEPAM 2 MG/ML IJ SOLN
0.5000 mg | Freq: Four times a day (QID) | INTRAMUSCULAR | Status: DC | PRN
  Administered 2018-01-17: 0.5 mg via INTRAVENOUS
  Filled 2018-01-14: qty 1

## 2018-01-14 MED ORDER — TRAMADOL HCL 50 MG PO TABS: 50.0000 mg | ORAL_TABLET | Freq: Three times a day (TID) | ORAL | Status: DC | PRN

## 2018-01-14 MED ORDER — PSYLLIUM 95 % PO PACK
1.0000 | PACK | Freq: Every day | ORAL | Status: DC
  Administered 2018-01-16 – 2018-01-19 (×4): 1 via ORAL
  Filled 2018-01-14 (×6): qty 1

## 2018-01-14 MED ORDER — ICE PACK FOR EPOPROSTENOL (FLOLAN) INFUSION
1.0000 | Status: DC
  Administered 2018-01-15 – 2018-01-19 (×5): 1
  Filled 2018-01-14 (×5): qty 1

## 2018-01-14 MED ORDER — SODIUM CHLORIDE 0.9 % IV SOLN
INTRAVENOUS | Status: DC
  Administered 2018-01-14 – 2018-01-16 (×3): via INTRAVENOUS

## 2018-01-14 MED ORDER — METHOCARBAMOL 500 MG PO TABS: 500.0000 mg | ORAL_TABLET | Freq: Four times a day (QID) | ORAL | Status: DC | PRN

## 2018-01-14 MED ORDER — POLYETHYLENE GLYCOL 3350 17 G PO PACK: 17.0000 g | PACK | Freq: Every day | ORAL | Status: DC | PRN

## 2018-01-14 MED ORDER — MELOXICAM 7.5 MG PO TABS
7.5000 mg | ORAL_TABLET | Freq: Every day | ORAL | Status: DC
  Administered 2018-01-15 – 2018-01-19 (×5): 7.5 mg via ORAL
  Filled 2018-01-14 (×6): qty 1

## 2018-01-14 MED ORDER — HOME MED STORE IN PYXIS
1.0000 | Freq: Four times a day (QID) | Status: DC
  Administered 2018-01-14 – 2018-01-19 (×22): 1 via OPHTHALMIC

## 2018-01-14 MED ORDER — MAGNESIUM SULFATE 4 GM/100ML IV SOLN
4.0000 g | Freq: Once | INTRAVENOUS | Status: AC
  Administered 2018-01-14: 4 g via INTRAVENOUS
  Filled 2018-01-14: qty 100

## 2018-01-14 MED ORDER — AMLODIPINE BESYLATE 5 MG PO TABS
5.0000 mg | ORAL_TABLET | Freq: Every day | ORAL | Status: DC
  Administered 2018-01-14 – 2018-01-19 (×6): 5 mg via ORAL
  Filled 2018-01-14 (×6): qty 1

## 2018-01-14 MED ORDER — PANTOPRAZOLE SODIUM 40 MG PO TBEC
40.0000 mg | DELAYED_RELEASE_TABLET | Freq: Every day | ORAL | Status: DC
  Administered 2018-01-14 – 2018-01-19 (×6): 40 mg via ORAL
  Filled 2018-01-14 (×6): qty 1

## 2018-01-14 MED ORDER — GI COCKTAIL ~~LOC~~: 30.0000 mL | Freq: Three times a day (TID) | ORAL | Status: DC | PRN

## 2018-01-14 MED ORDER — POTASSIUM CHLORIDE CRYS ER 20 MEQ PO TBCR
40.0000 meq | EXTENDED_RELEASE_TABLET | ORAL | Status: AC
  Administered 2018-01-14 (×2): 40 meq via ORAL
  Filled 2018-01-14 (×2): qty 2

## 2018-01-14 NOTE — Progress Notes (Signed)
Nutrition Brief Note  Patient identified on the Malnutrition Screening Tool (MST) Report  Wt Readings from Last 15 Encounters:  01/13/18 119 lb (54 kg)  01/05/18 119 lb (54 kg)  12/29/17 119 lb (54 kg)  10/06/17 120 lb (54.4 kg)  06/19/17 121 lb 6.4 oz (55.1 kg)  01/14/17 124 lb 12.8 oz (56.6 kg)  01/08/17 125 lb 3.2 oz (56.8 kg)  10/14/16 120 lb 12.8 oz (54.8 kg)  07/03/16 121 lb 9.6 oz (55.2 kg)  03/29/16 120 lb 12.8 oz (54.8 kg)  03/13/16 116 lb (52.6 kg)  10/10/15 122 lb (55.3 kg)  09/19/15 119 lb (54 kg)  03/16/14 118 lb 3.2 oz (53.6 kg)  11/26/13 122 lb 6.4 oz (55.5 kg)    Body mass index is 21.42 kg/m. Patient meets criteria for normal weight based on current BMI. She has R knee incision from 01/05/18 when she had R knee replacement. She arrived to the ED yesterday from facility for weakness, confusion, and abnormal lab work. She had been nauseated x1 in the past week after being given pain medication. This resolved with anti-nausea medication and she has had no other episodes of nausea. Her appetite is at baseline but she does not like the food at the facility so she has not been eating as well as usual the past few days.   Current diet order is Regular. Medications reviewed; 1000 units vitamin D/day, 100 mg Colace BID, 4 g IV Mg sulfate x1 run today, 10 mEq IV KCl x3 runs yesterday, 40 mEq oral KCl x1 dose yesterday and x2 doses today. Labs reviewed; Na: 126 mmol/L, K: 2.8 mmol/L, Cl: 87 mmol/L, creatinine: 0.4 mg/dL.  IVF: NS @ 100 mL/hr.   No nutrition interventions warranted at this time. If nutrition issues arise, please consult RD.     Jarome Matin, MS, RD, LDN, [facility] Inpatient Clinical Dietitian Pager # [phone] After hours/weekend pager # [phone]

## 2018-01-14 NOTE — Evaluation (Signed)
Physical Therapy Evaluation Patient Details Name: Cassandra Sims MRN: [id number] DOB: 03/30/1933 Today's Date: 01/14/2018   History of Present Illness  Pt admitted from short term SNF level rehab with abnormal labs.  Pt with hx of recent R TKR 01/05/18  Clinical Impression  Pt admitted as above and presenting with functional mobility limitations 2* generalized weakness, balance deficits and post op R knee pain.  Pt would benefit from follow up rehab at SNF level.    Follow Up Recommendations SNF    Equipment Recommendations  None recommended by PT    Recommendations for Other Services       Precautions / Restrictions Precautions Precautions: Knee;Fall Knee Immobilizer - Right: Discontinue once straight leg raise with < 10 degree lag Restrictions Weight Bearing Restrictions: No RLE Weight Bearing: Weight bearing as tolerated      Mobility  Bed Mobility Overal bed mobility: Needs Assistance Bed Mobility: Supine to Sit     Supine to sit: Min assist;Mod assist     General bed mobility comments: cues for sequence and use of L LE to self assist.  Physical assist to manage R LE and to bring trunk to upright  Transfers Overall transfer level: Needs assistance Equipment used: Rolling walker (2 wheeled) Transfers: Sit to/from [facility] Sit to Stand: Min assist;Mod assist Stand pivot transfers: Min assist;Mod assist       General transfer comment: cues for use of UEs to self assist; stand pvt with RW bed to [facility]  Ambulation/Gait Ambulation/Gait assistance: Min assist;+2 safety/equipment Gait Distance (Feet): 12 Feet Assistive device: Rolling walker (2 wheeled) Gait Pattern/deviations: Step-to pattern;Step-through pattern;Decreased step length - right;Decreased step length - left;Shuffle;Trunk flexed Gait velocity: decreased   General Gait Details: cues for posture, position from RW and initial sequence.  Distance ltd by pt fatigue and c/o  dizziness  Stairs            Wheelchair Mobility    Modified Rankin (Stroke Patients Only)       Balance Overall balance assessment: Needs assistance Sitting-balance support: Feet supported;No upper extremity supported Sitting balance-Leahy Scale: Fair     Standing balance support: Bilateral upper extremity supported Standing balance-Leahy Scale: Poor                               Pertinent Vitals/Pain Pain Assessment: 0-10 Pain Score: 3  Pain Location: R knee with flex Pain Descriptors / Indicators: Aching;Sore Pain Intervention(s): Limited activity within patient's tolerance;Monitored during session    Home Living Family/patient expects to be discharged to:: Skilled nursing facility                      Prior Function Level of Independence: Independent         Comments: Prior to admit for TKR 01/05/18     Hand Dominance        Extremity/Trunk Assessment   Upper Extremity Assessment Upper Extremity Assessment: Overall WFL for tasks assessed    Lower Extremity Assessment Lower Extremity Assessment: RLE deficits/detail;Generalized weakness RLE Deficits / Details: AAROM at knee -8 - 75 with 3/5 quads LLE Deficits / Details: WFL       Communication   Communication: No difficulties  Cognition Arousal/Alertness: Awake/alert Behavior During Therapy: WFL for tasks assessed/performed Overall Cognitive Status: Within Functional Limits for tasks assessed  General Comments      Exercises Total Joint Exercises Ankle Circles/Pumps: AROM;Both;15 reps;Supine Quad Sets: AROM;Both;10 reps Heel Slides: AAROM;Right;15 reps;Supine Straight Leg Raises: Right;10 reps;AAROM;AROM   Assessment/Plan    PT Assessment Patient needs continued PT services  PT Problem List Decreased strength;Decreased range of motion;Decreased activity tolerance;Decreased mobility;Decreased knowledge of  precautions;Decreased safety awareness;Decreased knowledge of use of DME;Pain       PT Treatment Interventions DME instruction;Therapeutic exercise;Gait training;Functional mobility training;Therapeutic activities;Patient/family education    PT Goals (Current goals can be found in the Care Plan section)  Acute Rehab PT Goals Patient Stated Goal: Regain IND PT Goal Formulation: With patient Time For Goal Achievement: 01/21/18 Potential to Achieve Goals: Good    Frequency Min 5X/week   Barriers to discharge        Co-evaluation               AM-PAC PT "6 Clicks" Daily Activity  Outcome Measure Difficulty turning over in bed (including adjusting bedclothes, sheets and blankets)?: A Little Difficulty moving from lying on back to sitting on the side of the bed? : Unable Difficulty sitting down on and standing up from a chair with arms (e.g., wheelchair, bedside commode, etc,.)?: Unable Help needed moving to and from a bed to chair (including a wheelchair)?: A Lot Help needed walking in hospital room?: A Lot Help needed climbing 3-5 steps with a railing? : A Lot 6 Click Score: 11    End of Session Equipment Utilized During Treatment: Gait belt Activity Tolerance: Patient tolerated treatment well Patient left: in chair;with call bell/phone within reach;with chair alarm set Nurse Communication: Mobility status PT Visit Diagnosis: Unsteadiness on feet (R26.81);Pain Pain - Right/Left: Right Pain - part of body: Knee    Time: 1035-1110 PT Time Calculation (min) (ACUTE ONLY): 35 min   Charges:   PT Evaluation $PT Eval Moderate Complexity: 1 Mod PT Treatments $Gait Training: 8-22 mins   PT G Codes:        Pg [phone]   Glennie Bose 01/14/2018, 12:41 PM

## 2018-01-14 NOTE — Progress Notes (Signed)
PROGRESS NOTE    Cassandra Sims  OIB:[id number] DOB: 23-Nov-1932 DOA: 01/13/2018 PCP: Lilian Coma., MD   Brief Narrative:  84 year old female history of recent right knee replacement and discharged to a skilled nursing facility presented back with confusion, weakness, significant hyponatremia, hypokalemia.  Patient also noted to have QTC prolongation.  Blood pressure was borderline and as such Norvasc held.   Assessment & Plan:   Principal Problem:   Hyponatremia Active Problems:   HTN (hypertension)   Arthritis   GERD (gastroesophageal reflux disease)   Hypokalemia   Prolonged QT interval   S/P knee replacement  #1 hyponatremia Likely secondary to hypovolemic hyponatremia in the setting of HCTZ.  Patient noted to have poor oral intake prior to admission with a episode of nausea and emesis.  Urinalysis done was unremarkable.  Urine sodium was less than 10.  Urine creatinine of 36.5.  Patient placed on IV fluids with aggressive hydration with improvement during sodium levels.  Sodium currently at 126 from 117 on admission.  HCTZ has been discontinued and will not be resumed.  Continue IV fluids.  Repeat be met this afternoon.  Follow.  2.  Hypokalemia Likely secondary to GI losses and diuretics.  Patient noted to have a bout of nausea and emesis and also on HCTZ.  HCTZ has been discontinued.  Replete potassium.  Keep magnesium greater than 2.  Follow.  3.  Prolonged QT interval Repeat EKG.  Keep potassium greater than 4.  Keep magnesium greater than 2.  Replete electrolytes.  Follow.  4.  Status post right knee replacement Patient status post right knee replacement secondary to end-stage OA of the right knee with progressive worsening pain and dysfunction.  Patient underwent right total knee arthroplasty per Dr. Wynelle Link 01/05/2018.  Will inform orthopedics of patient's admission and have them come and assess patient's right knee.  PT/OT.  Continue aspirin twice daily.  5.   Anemia H&H stable.  Check an anemia panel.  Follow.  6.  Glaucoma Resume home eyedrops.  7.  Hypertension Blood pressure was borderline/soft on admission and as such patient's Norvasc was held.  Resume home dose Norvasc.  Follow.   DVT prophylaxis: Aspirin/Lovenox Code Status: Full Family Communication: Updated patient and daughter at bedside. Disposition Plan: To be determined.  Likely back to skilled nursing facility once medically stable.   Consultants:   None  Procedures:   Chest x-ray 01/13/2018  Antimicrobials:   None   Subjective: Patient sitting up in bed.  Denies any chest pain.  No shortness of breath.  No further nausea or vomiting.  Patient alert and oriented to self place and time.  Objective: Vitals:   01/14/18 0700 01/14/18 0800 01/14/18 0900 01/14/18 1000  BP:  140/69  (!) 153/62  Pulse: 65 69 71 68  Resp: '15 17 16 16  ' Temp:      TempSrc:      SpO2: 95% 93% 96% 96%  Weight:      Height:        Intake/Output Summary (Last 24 hours) at 01/14/2018 1153 Last data filed at 01/14/2018 0100 Gross per 24 hour  Intake 1155 ml  Output -  Net 1155 ml   Filed Weights   01/13/18 1433  Weight: 54 kg (119 lb)    Examination:  General exam: Appears calm and comfortable  Respiratory system: Lungs clear to auscultation bilaterally anterior lung fields.  Respiratory effort normal. Cardiovascular system: S1 & S2 heard, RRR. No JVD, murmurs, rubs,  gallops or clicks. No pedal edema. Gastrointestinal system: Abdomen is nondistended, soft and nontender. No organomegaly or masses felt. Normal bowel sounds heard. Central nervous system: Alert and oriented. No focal neurological deficits. Extremities: Right lower extremity with swelling and some erythema around the right knee with Steri-Strips noted.  Some ecchymosis in posterior lower thigh region.   Skin: No rashes, lesions or ulcers Psychiatry: Judgement and insight appear normal. Mood & affect appropriate.      Data Reviewed: I have personally reviewed following labs and imaging studies  CBC: Recent Labs  Lab 01/08/18 0528 01/13/18 1500 01/14/18 0329  WBC 9.2 10.8* 12.3*  HGB 10.6* 10.0* 9.8*  HCT 30.1* 28.1* 27.6*  MCV 88.0 85.9 86.0  PLT 148* 315 756   Basic Metabolic Panel: Recent Labs  Lab 01/13/18 1500 01/14/18 0329  NA 117* 126*  K 2.2* 2.8*  CL 73* 87*  CO2 33* 30  GLUCOSE 136* 104*  BUN 20 13  CREATININE 0.69 0.40*  CALCIUM 8.6* 8.2*  MG 1.9 1.7   GFR: Estimated Creatinine Clearance: 43.9 mL/min (A) (by C-G formula based on SCr of 0.4 mg/dL (L)). Liver Function Tests: Recent Labs  Lab 01/13/18 1500  AST 29  ALT 44  ALKPHOS 108  BILITOT 1.5*  PROT 5.7*  ALBUMIN 2.9*   No results for input(s): LIPASE, AMYLASE in the last 168 hours. No results for input(s): AMMONIA in the last 168 hours. Coagulation Profile: No results for input(s): INR, PROTIME in the last 168 hours. Cardiac Enzymes: No results for input(s): CKTOTAL, CKMB, CKMBINDEX, TROPONINI in the last 168 hours. BNP (last 3 results) No results for input(s): PROBNP in the last 8760 hours. HbA1C: No results for input(s): HGBA1C in the last 72 hours. CBG: No results for input(s): GLUCAP in the last 168 hours. Lipid Profile: No results for input(s): CHOL, HDL, LDLCALC, TRIG, CHOLHDL, LDLDIRECT in the last 72 hours. Thyroid Function Tests: No results for input(s): TSH, T4TOTAL, FREET4, T3FREE, THYROIDAB in the last 72 hours. Anemia Panel: No results for input(s): VITAMINB12, FOLATE, FERRITIN, TIBC, IRON, RETICCTPCT in the last 72 hours. Sepsis Labs: Recent Labs  Lab 01/13/18 1510  LATICACIDVEN 1.73    No results found for this or any previous visit (from the past 240 hour(s)).       Radiology Studies: Dg Chest 2 View  Result Date: 01/13/2018 CLINICAL DATA:  Weakness and confusion following knee surgery 2 days ago. EXAM: CHEST - 2 VIEW COMPARISON:  09/12/2017 FINDINGS: Chronic elevation  of the left hemidiaphragm. Chronic volume loss at both lung bases secondary to that. Volume loss at the right base may be slightly above baseline. The upper lungs are clear. No acute bone finding. IMPRESSION: Chronic elevation of the left hemidiaphragm with chronic left lower lung volume loss. Mild volume loss at the right lung base, probably slightly increased above baseline. Electronically Signed   By: Nelson Chimes M.D.   On: 01/13/2018 15:53        Scheduled Meds: . aspirin  325 mg Oral BID  . azelastine  1 spray Each Nare BID  . cholecalciferol  1,000 Units Oral Daily  . docusate sodium  100 mg Oral BID  . enoxaparin (LOVENOX) injection  40 mg Subcutaneous Q24H  . [START ON 01/15/2018] ice pack  1 each Other Q24H  . latanoprost  1 drop Both Eyes QHS  . mometasone-formoterol  2 puff Inhalation BID  . montelukast  10 mg Oral QHS  . potassium chloride  40 mEq  Oral Q4H  . home med stored in pyxis  1 each Both Eyes QID  . tacrolimus   Topical BID   Continuous Infusions: . sodium chloride 100 mL/hr at 01/14/18 0909     LOS: 1 day    Time spent: 40 minutes    Irine Seal, MD [facility] Pager [phone]  If 7PM-7AM, please contact night-coverage [email or web address] Password [facility] 01/14/2018, 11:53 AM

## 2018-01-14 NOTE — Progress Notes (Addendum)
OT Cancellation Note  Patient Details Name: HEAVENLY CHRISTINE MRN: [id number] DOB: [birth year]   Cancelled Treatment:    Reason Eval/Treat Not Completed: Other (comment).  Pt was at [facility] for rehab following TKA.  Plan is to return to SNF. Will defer OT to that venue.  Nyeshia Mysliwiec 01/14/2018, 12:52 PM  Lesle Chris, OTR/L [phone] 01/14/2018

## 2018-01-15 ENCOUNTER — Other Ambulatory Visit: Payer: Self-pay

## 2018-01-15 LAB — BASIC METABOLIC PANEL
ANION GAP: 5 (ref 5–15)
Anion gap: 6 (ref 5–15)
BUN: 10 mg/dL (ref 8–23)
BUN: 13 mg/dL (ref 8–23)
CALCIUM: 7.5 mg/dL — AB (ref 8.9–10.3)
CHLORIDE: 97 mmol/L — AB (ref 98–111)
CO2: 25 mmol/L (ref 22–32)
CO2: 25 mmol/L (ref 22–32)
CREATININE: 0.36 mg/dL — AB (ref 0.44–1.00)
CREATININE: 0.48 mg/dL (ref 0.44–1.00)
Calcium: 7.5 mg/dL — ABNORMAL LOW (ref 8.9–10.3)
Chloride: 97 mmol/L — ABNORMAL LOW (ref 98–111)
GFR calc Af Amer: >60 mL/min (ref >60)
GFR calc Af Amer: >60 mL/min (ref >60)
GFR calc non Af Amer: >60 mL/min (ref >60)
Glucose, Bld: 107 mg/dL — ABNORMAL HIGH (ref 70–99)
Glucose, Bld: 142 mg/dL — ABNORMAL HIGH (ref 70–99)
Potassium: 3.9 mmol/L (ref 3.5–5.1)
Potassium: 3.7 mmol/L (ref 3.5–5.1)
Sodium: 128 mmol/L — ABNORMAL LOW (ref 135–145)
Sodium: 127 mmol/L — ABNORMAL LOW (ref 135–145)

## 2018-01-15 LAB — CBC
HCT: 27 % — ABNORMAL LOW (ref 36.0–46.0)
Hemoglobin: 9.3 g/dL — ABNORMAL LOW (ref 12.0–15.0)
MCH: 30.8 pg (ref 26.0–34.0)
MCHC: 34.4 g/dL (ref 30.0–36.0)
MCV: 89.4 fL (ref 78.0–100.0)
PLATELETS: 350 10*3/uL (ref 150–400)
RBC: 3.02 MIL/uL — ABNORMAL LOW (ref 3.87–5.11)
RDW: 13.9 % (ref 11.5–15.5)
WBC: 10.1 10*3/uL (ref 4.0–10.5)

## 2018-01-15 LAB — IRON AND TIBC
IRON: 34 ug/dL (ref 28–170)
SATURATION RATIOS: 12 % (ref 10.4–31.8)
TIBC: 275 ug/dL (ref 250–450)
UIBC: 241 ug/dL

## 2018-01-15 LAB — VITAMIN B12: Vitamin B-12: 1940 pg/mL — ABNORMAL HIGH (ref 180–914)

## 2018-01-15 LAB — FERRITIN: FERRITIN: 243 ng/mL (ref 11–307)

## 2018-01-15 LAB — FOLATE: Folate: 10.6 ng/mL (ref >5.9)

## 2018-01-15 LAB — MAGNESIUM: Magnesium: 2.1 mg/dL (ref 1.7–2.4)

## 2018-01-15 MED ORDER — IRBESARTAN 150 MG PO TABS
150.0000 mg | ORAL_TABLET | Freq: Every day | ORAL | Status: DC
  Administered 2018-01-15: 150 mg via ORAL
  Filled 2018-01-15: qty 1

## 2018-01-15 MED ORDER — ACETAMINOPHEN 500 MG PO TABS
500.0000 mg | ORAL_TABLET | Freq: Four times a day (QID) | ORAL | Status: DC | PRN
  Administered 2018-01-15 – 2018-01-19 (×5): 500 mg via ORAL
  Filled 2018-01-15 (×5): qty 1

## 2018-01-15 NOTE — Progress Notes (Signed)
PROGRESS NOTE    Cassandra Sims  WUJ:[id number] DOB: 15-Dec-1932 DOA: 01/13/2018 PCP: Lilian Coma., MD   Brief Narrative:  84 year old female history of recent right knee replacement and discharged to a skilled nursing facility presented back with confusion, weakness, significant hyponatremia, hypokalemia.  Patient also noted to have QTC prolongation.  Blood pressure was borderline and as such Norvasc held.   Assessment & Plan:   Principal Problem:   Hyponatremia Active Problems:   HTN (hypertension)   Arthritis   GERD (gastroesophageal reflux disease)   Hypokalemia   Prolonged QT interval   S/P knee replacement  #1 hyponatremia Likely secondary to hypovolemic hyponatremia in the setting of HCTZ.  Patient noted to have poor oral intake prior to admission with a episode of nausea and emesis.  Urinalysis done was unremarkable.  Urine sodium was less than 10.  Urine creatinine of 36.5.  Patient placed on IV fluids with aggressive hydration with improvement during sodium levels.  Sodium currently at 128 from 126 from 117 on admission.  HCTZ has been discontinued and will not be resumed on discharge.  Continue IV fluids.  BMET this afternoon. Follow.  2.  Hypokalemia Likely secondary to GI losses and diuretics.  Patient noted to have a bout of nausea and emesis and also on HCTZ prior to admission.  HCTZ has been discontinued.  Repleted potassium.  Keep magnesium greater than 2.  Follow.  3.  Prolonged QT interval Repeat EKG this morning.  Keep potassium greater than 4.  Keep magnesium greater than 2.  Follow.  4.  Status post right knee replacement Patient status post right knee replacement secondary to end-stage OA of the right knee with progressive worsening pain and dysfunction.  Patient underwent right total knee arthroplasty per Dr. Wynelle Link 01/05/2018.  Orthopedics informed of patient's admission.  PT/OT.  Continue aspirin twice daily.  Outpatient follow-up with orthopedics.   5.   Anemia H&H stable.  Anemia panel consistent with anemia of chronic disease.  Follow.    6.  Glaucoma Continue home eyedrops.  7.  Hypertension Blood pressure was borderline/soft on admission and as such patient's Norvasc was held.  BP improved and currently somewhat elevated.  Norvasc resumed.  Will place on low-dose Avapro at 75 mg daily.  Follow.   DVT prophylaxis: Aspirin/Lovenox Code Status: Full Family Communication: Updated patient and daughter at bedside. Disposition Plan: To be determined.  Likely back to skilled nursing facility once medically stable.   Consultants:   None  Procedures:   Chest x-ray 01/13/2018  Antimicrobials:   None   Subjective: Patient in bed.  Feeling better.  No chest pain.  No shortness of breath.  No nausea or vomiting.    Objective: Vitals:   01/14/18 2000 01/14/18 2319 01/15/18 0337 01/15/18 0400  BP: (!) 154/68   (!) 147/75  Pulse: 77   67  Resp: 20   15  Temp:  98.2 F (36.8 C) 98.3 F (36.8 C)   TempSrc:  Oral Oral   SpO2: 99%   97%  Weight:      Height:        Intake/Output Summary (Last 24 hours) at 01/15/2018 0903 Last data filed at 01/14/2018 2300 Gross per 24 hour  Intake -  Output 800 ml  Net -800 ml   Filed Weights   01/13/18 1433  Weight: 54 kg (119 lb)    Examination:  General exam: NAD. Respiratory system: CTAB. No wheezing. No crackles. Respiratory effort normal. Cardiovascular  system: Regular rate and rhythm no murmurs rubs or gallops.  No JVD.  No lower extremity edema.  Gastrointestinal system: Abdomen is soft, nontender, nondistended, positive bowel sounds.  No rebound.  No guarding.   Central nervous system: Alert and oriented. No focal neurological deficits. Extremities: Right lower extremity knee with swelling and some erythema around the right knee with Steri-Strips noted.  Some ecchymosis in posterior lower thigh region.   Skin: No rashes, lesions or ulcers Psychiatry: Judgement and insight  appear normal. Mood & affect appropriate.     Data Reviewed: I have personally reviewed following labs and imaging studies  CBC: Recent Labs  Lab 01/13/18 1500 01/14/18 0329 01/15/18 0312  WBC 10.8* 12.3* 10.1  HGB 10.0* 9.8* 9.3*  HCT 28.1* 27.6* 27.0*  MCV 85.9 86.0 89.4  PLT 315 302 638   Basic Metabolic Panel: Recent Labs  Lab 01/13/18 1500 01/14/18 0329 01/14/18 1434 01/15/18 0312  NA 117* 126* 125* 128*  K 2.2* 2.8* 3.3* 3.9  CL 73* 87* 88* 97*  CO2 33* 30 29 25   GLUCOSE 136* 104* 154* 107*  BUN 20 13 14 10   CREATININE 0.69 0.40* 0.49 0.36*  CALCIUM 8.6* 8.2* 7.4* 7.5*  MG 1.9 1.7  --  2.1   GFR: Estimated Creatinine Clearance: 43.9 mL/min (A) (by C-G formula based on SCr of 0.36 mg/dL (L)). Liver Function Tests: Recent Labs  Lab 01/13/18 1500  AST 29  ALT 44  ALKPHOS 108  BILITOT 1.5*  PROT 5.7*  ALBUMIN 2.9*   No results for input(s): LIPASE, AMYLASE in the last 168 hours. No results for input(s): AMMONIA in the last 168 hours. Coagulation Profile: No results for input(s): INR, PROTIME in the last 168 hours. Cardiac Enzymes: No results for input(s): CKTOTAL, CKMB, CKMBINDEX, TROPONINI in the last 168 hours. BNP (last 3 results) No results for input(s): PROBNP in the last 8760 hours. HbA1C: No results for input(s): HGBA1C in the last 72 hours. CBG: No results for input(s): GLUCAP in the last 168 hours. Lipid Profile: No results for input(s): CHOL, HDL, LDLCALC, TRIG, CHOLHDL, LDLDIRECT in the last 72 hours. Thyroid Function Tests: No results for input(s): TSH, T4TOTAL, FREET4, T3FREE, THYROIDAB in the last 72 hours. Anemia Panel: Recent Labs    01/15/18 0312  VITAMINB12 1,940*  FOLATE 10.6  FERRITIN 243  TIBC 275  IRON 34   Sepsis Labs: Recent Labs  Lab 01/13/18 1510  LATICACIDVEN 1.73    No results found for this or any previous visit (from the past 240 hour(s)).       Radiology Studies: Dg Chest 2 View  Result Date:  01/13/2018 CLINICAL DATA:  Weakness and confusion following knee surgery 2 days ago. EXAM: CHEST - 2 VIEW COMPARISON:  09/12/2017 FINDINGS: Chronic elevation of the left hemidiaphragm. Chronic volume loss at both lung bases secondary to that. Volume loss at the right base may be slightly above baseline. The upper lungs are clear. No acute bone finding. IMPRESSION: Chronic elevation of the left hemidiaphragm with chronic left lower lung volume loss. Mild volume loss at the right lung base, probably slightly increased above baseline. Electronically Signed   By: Nelson Chimes M.D.   On: 01/13/2018 15:53        Scheduled Meds: . amLODipine  5 mg Oral Daily  . aspirin  325 mg Oral BID  . azelastine  1 spray Each Nare BID  . calcium-vitamin D   Oral Daily  . cholecalciferol  1,000 Units Oral  Daily  . docusate sodium  100 mg Oral BID  . enoxaparin (LOVENOX) injection  40 mg Subcutaneous Q24H  . ice pack  1 each Other Q24H  . irbesartan  150 mg Oral Daily  . latanoprost  1 drop Both Eyes QHS  . meloxicam  7.5 mg Oral Daily  . mometasone-formoterol  2 puff Inhalation BID  . montelukast  10 mg Oral QHS  . multivitamin  1 tablet Oral Daily  . pantoprazole  40 mg Oral Q0600  . psyllium  1 packet Oral Daily  . home med stored in pyxis  1 each Both Eyes QID   Continuous Infusions: . sodium chloride 100 mL/hr at 01/14/18 0909     LOS: 2 days    Time spent: 40 minutes    Irine Seal, MD [facility] Pager [phone]  If 7PM-7AM, please contact night-coverage [email or web address] Password [facility] 01/15/2018, 9:03 AM

## 2018-01-15 NOTE — Plan of Care (Deleted)
Progressing

## 2018-01-15 NOTE — Clinical Social Work Note (Addendum)
This Probation officer completed assessment below on 6/18. No significant changes have occurred. The patient is hopeful to discharge back to [facility] to continue her therapy. CSW will determine bed availability at SNF  in the am and have SNF initiate authorization.Patient will need new insurance authorization from [facility].  Patient reports understanding.    Clinical Social Work Assessment  Patient Details  Name: Cassandra Sims MRN: [id number] Date of Birth: 1933-06-13  Date of referral:                  Reason for consult:                   Permission sought to share information with:    Permission granted to share information::     Name::        Agency::     Relationship::    Daughters  Contact Information:     Housing/Transportation Living arrangements for the past 2 months:    Source of Information:    Patient Interpreter Needed:    Criminal Activity/Legal Involvement Pertinent to Current Situation/Hospitalization:    Significant Relationships:    Lives with:    Do you feel safe going back to the place where you live?    Need for family participation in patient care:  No  Care giving concerns:   Patient presented for surgery, with the diagnosis of Osteoarthritis Right Knee. Patient had procedure right total knee arthroplasty as surgical intervention.   Social Worker assessment / plan:CSW met with the patient at bedside, explain role and reason for visit-to assist with discharge to [facility] for short rehab. Patient explained she met with the liaison Whitney to discuss discharge plans for rehab due to her inability to stay home alone after surgery. Patient reports she has adult children but they all have jobs and cannot be with there with her during the day. Patient reports prior to surgery she was very independent and still drives.  CSW inquired about discharge plan with SNF liaison Whitney. She reports the patient will have bed and will have out of network insurance benefits for  [employer]. The patient will have to private pay $169 a day. CSW explained this to patient, she is agreeable to pay to $169 day for rehab.   CSW completed FL2  SNF initiated the [employer] .    Plan: SNF placement.   Employment status:    Insurance information:    PT Recommendations:    Information / Referral to community resources:     Patient/Family's Response to care: Agreeable and Responding well to care.   Patient/Family's Understanding of and Emotional Response to Diagnosis, Current Treatment, and Prognosis:  Patient alert and oriented x4. Patient has a good understanding of her diagnosis and current treatment plan. She was proactive in prearranging her stay at [facility].   Emotional Assessment Appearance:    Attitude/Demeanor/Rapport:    Affect (typically observed):    Orientation:    Alcohol / Substance use:    Psych involvement (Current and /or in the community):     Discharge Needs  Concerns to be addressed:    Readmission within the last 30 days:    Current discharge risk:    Barriers to Discharge:      Lia Hopping, LCSW 01/15/2018, 4:55 PM

## 2018-01-15 NOTE — NC FL2 (Signed)
[place] LEVEL OF CARE SCREENING TOOL     IDENTIFICATION  Patient Name: Cassandra Sims Birthdate: Nov 06, 1932 Sex: female Admission Date (Current Location): 01/13/2018  [facility] and [place] Number:  Herbalist and Address:  [facility],  [place], [place]      Provider Number: [id number]  Attending Physician Name and Address:  Eugenie Filler, MD  Relative Name and Phone Number:       Current Level of Care: Hospital Recommended Level of Care: [place] Prior Approval Number:    Date Approved/Denied:   PASRR Number:  [id number]  Discharge Plan: SNF    Current Diagnoses: Patient Active Problem List   Diagnosis Date Noted  . Hyponatremia 01/13/2018  . Prolonged QT interval 01/13/2018  . S/P knee replacement 01/13/2018  . Hypokalemia   . OA (osteoarthritis) of knee 01/05/2018  . GERD (gastroesophageal reflux disease) 07/03/2016  . Hemidiaphragm paralysis 07/03/2016  . Nodule of right lung 06/19/2016  . HTN (hypertension) 03/29/2016  . Glaucoma 03/29/2016  . Arthritis 03/29/2016  . Sinusitis, chronic 11/25/2012  . Cough variant asthma 11/25/2012  . Cough 11/24/2012    Orientation RESPIRATION BLADDER Height & Weight     Self, Time, Situation, Place  Normal Continent Weight: 119 lb (54 kg) Height:  5' 2.5" (158.8 cm)  BEHAVIORAL SYMPTOMS/MOOD NEUROLOGICAL BOWEL NUTRITION STATUS      Continent (Regular )  AMBULATORY STATUS COMMUNICATION OF NEEDS Skin   Extensive Assist Verbally Surgical wounds                       Personal Care Assistance Level of Assistance  Bathing, Feeding, Dressing Bathing Assistance: Limited assistance Feeding assistance: Independent Dressing Assistance: Limited assistance     Functional Limitations Info  Sight, Hearing, Speech Sight Info: Adequate Hearing Info: Adequate Speech Info: Adequate    SPECIAL CARE FACTORS FREQUENCY  PT (By licensed PT), OT (By  licensed OT)     PT Frequency: 5x/week OT Frequency: 5x/week            Contractures Contractures Info: Not present    Additional Factors Info  Code Status, Allergies Code Status Info: Fullcode Allergies Info: Penicillins, Adhesive Tape, Clindamycin/lincomycin, Codeine, Darvocet Propoxyphene N-acetaminophen, Dilaudid Hydromorphone Hcl, Doxycycline, Flagyl Metronidazole, Lactose Intolerance (Gi), Levofloxacin, Macrodantin Nitrofurantoin Macrocrystal, Morphine And Related, Premarin Conjugated Estrogens, Tramadol Hcl, Zantac Ranitidine Hcl, Zofran Ondansetron Hcl, Nitrofuran Derivatives, Sulfa Antibiotics           Current Medications (01/15/2018):  This is the current hospital active medication list Current Facility-Administered Medications  Medication Dose Route Frequency Provider Last Rate Last Dose  . 0.9 %  sodium chloride infusion   Intravenous Continuous Eugenie Filler, MD 100 mL/hr at 01/15/18 1306    . albuterol (PROVENTIL) (2.5 MG/3ML) 0.083% nebulizer solution 3 mL  3 mL Inhalation Q4H PRN Georgette Shell, MD      . amLODipine (NORVASC) tablet 5 mg  5 mg Oral Daily Eugenie Filler, MD   5 mg at 01/15/18 0916  . aspirin EC tablet 325 mg  325 mg Oral BID Georgette Shell, MD   325 mg at 01/15/18 8891  . azelastine (ASTELIN) 0.1 % nasal spray 1 spray  1 spray Each Nare BID Georgette Shell, MD   1 spray at 01/15/18 [phone]  . calcium-vitamin D (OSCAL WITH D) 500-200 MG-UNIT per tablet   Oral Daily Eugenie Filler, MD      .  cholecalciferol (VITAMIN D) tablet 1,000 Units  1,000 Units Oral Daily Georgette Shell, MD   1,000 Units at 01/15/18 0915  . docusate sodium (COLACE) capsule 100 mg  100 mg Oral BID Georgette Shell, MD   100 mg at 01/15/18 0915  . enoxaparin (LOVENOX) injection 40 mg  40 mg Subcutaneous Q24H Georgette Shell, MD   40 mg at 01/14/18 2300  . fluticasone (FLONASE) 50 MCG/ACT nasal spray 1 spray  1 spray Each Nare BID PRN Georgette Shell, MD      . gi cocktail (Maalox,Lidocaine,Donnatal)  30 mL Oral TID PRN Eugenie Filler, MD      . Ice Pack for Serum Eye drops (Home med)  1 each Other Q24H Eugenie Filler, MD   1 each at 01/15/18 [phone]  . irbesartan (AVAPRO) tablet 150 mg  150 mg Oral Daily Eugenie Filler, MD   150 mg at 01/15/18 0915  . latanoprost (XALATAN) 0.005 % ophthalmic solution 1 drop  1 drop Both Eyes QHS Georgette Shell, MD   1 drop at 01/14/18 2300  . LORazepam (ATIVAN) injection 0.5 mg  0.5 mg Intravenous Q6H PRN Eugenie Filler, MD      . meloxicam [facility]) tablet 7.5 mg  7.5 mg Oral Daily Eugenie Filler, MD   7.5 mg at 01/15/18 0915  . methocarbamol (ROBAXIN) tablet 500 mg  500 mg Oral Q6H PRN Eugenie Filler, MD      . mometasone-formoterol [facility]) 200-5 MCG/ACT inhaler 2 puff  2 puff Inhalation BID Georgette Shell, MD   2 puff at 01/15/18 [phone]  . montelukast (SINGULAIR) tablet 10 mg  10 mg Oral QHS Georgette Shell, MD   10 mg at 01/14/18 2300  . multivitamin (PROSIGHT) tablet 1 tablet  1 tablet Oral Daily Eugenie Filler, MD   1 tablet at 01/15/18 0915  . oxyCODONE (Oxy IR/ROXICODONE) immediate release tablet 5 mg  5 mg Oral Q6H PRN Georgette Shell, MD      . pantoprazole (PROTONIX) EC tablet 40 mg  40 mg Oral Q0600 Eugenie Filler, MD   40 mg at 01/15/18 0659  . polyethylene glycol (MIRALAX / GLYCOLAX) packet 17 g  17 g Oral Daily PRN Eugenie Filler, MD      . polyvinyl alcohol (LIQUIFILM TEARS) 1.4 % ophthalmic solution 1 drop  1 drop Both Eyes 5 X Daily PRN Georgette Shell, MD      . psyllium (HYDROCIL/METAMUCIL) packet 1 packet  1 packet Oral Daily Eugenie Filler, MD      . Serum Eye [place] stored in Pyxis refrigerator  1 each Both Eyes QID Eugenie Filler, MD   1 each at 01/15/18 1417  . traMADol (ULTRAM) tablet 50-100 mg  50-100 mg Oral Q8H PRN Eugenie Filler, MD         Discharge Medications: Please see discharge  summary for a list of discharge medications.  Relevant Imaging Results:  Relevant Lab Results:   Additional Information [id number]  Lia Hopping, LCSW

## 2018-01-15 NOTE — Progress Notes (Signed)
Physical Therapy Treatment Patient Details Name: Cassandra Sims MRN: [id number] DOB: Feb 19, 1933 Today's Date: 01/15/2018    History of Present Illness Pt admitted from short term SNF level rehab with abnormal labs.  Pt with hx of recent R TKR 01/05/18    PT Comments    Pt ambulated 20' with RW, distance limited by fatigue, HR 80s, SaO2 98% on room air. Performed TKA exercises with min assist.   Follow Up Recommendations  SNF     Equipment Recommendations  None recommended by PT    Recommendations for Other Services       Precautions / Restrictions Precautions Precautions: Knee;Fall Precaution Comments: reviewed no pillow under knee Restrictions Weight Bearing Restrictions: No RLE Weight Bearing: Weight bearing as tolerated    Mobility  Bed Mobility               General bed mobility comments: up in recliner  Transfers Overall transfer level: Needs assistance Equipment used: Rolling walker (2 wheeled) Transfers: Sit to/from [facility] Sit to Stand: Mod assist Stand pivot transfers: Min assist       General transfer comment: cues for use of UEs to self assist; stand pvt with RW bed to [facility]  Ambulation/Gait Ambulation/Gait assistance: Min assist Gait Distance (Feet): 20 Feet Assistive device: Rolling walker (2 wheeled) Gait Pattern/deviations: Step-to pattern;Decreased step length - right;Decreased step length - left;Shuffle;Trunk flexed Gait velocity: decreased   General Gait Details: cues for posture, position from RW and initial sequence.  Distance ltd by pt fatigue    Stairs             Wheelchair Mobility    Modified Rankin (Stroke Patients Only)       Balance Overall balance assessment: Needs assistance Sitting-balance support: Feet supported;No upper extremity supported Sitting balance-Leahy Scale: Fair     Standing balance support: Bilateral upper extremity supported Standing balance-Leahy Scale: Poor                               Cognition Arousal/Alertness: Awake/alert Behavior During Therapy: WFL for tasks assessed/performed Overall Cognitive Status: Within Functional Limits for tasks assessed                                        Exercises Total Joint Exercises Ankle Circles/Pumps: AROM;Both;15 reps;Supine Quad Sets: AROM;Both;10 reps Short Arc Quad: AROM;AAROM;Right;10 reps Heel Slides: AAROM;Right;Supine;10 reps Hip ABduction/ADduction: AAROM;Right;10 reps;Supine Straight Leg Raises: Right;10 reps;AAROM Goniometric ROM: 5-45* AAROM R knee    General Comments        Pertinent Vitals/Pain Pain Score: 4  Pain Location: R knee with flex Pain Descriptors / Indicators: Aching;Sore Pain Intervention(s): Limited activity within patient's tolerance;Monitored during session;Ice applied    Home Living                      Prior Function            PT Goals (current goals can now be found in the care plan section) Acute Rehab PT Goals Patient Stated Goal: Regain IND PT Goal Formulation: With patient Time For Goal Achievement: 01/21/18 Potential to Achieve Goals: Good Progress towards PT goals: Progressing toward goals    Frequency    Min 5X/week      PT Plan Current plan remains appropriate    Co-evaluation  AM-PAC PT "6 Clicks" Daily Activity  Outcome Measure  Difficulty turning over in bed (including adjusting bedclothes, sheets and blankets)?: A Little Difficulty moving from lying on back to sitting on the side of the bed? : Unable Difficulty sitting down on and standing up from a chair with arms (e.g., wheelchair, bedside commode, etc,.)?: Unable Help needed moving to and from a bed to chair (including a wheelchair)?: A Lot Help needed walking in hospital room?: A Lot Help needed climbing 3-5 steps with a railing? : A Lot 6 Click Score: 11    End of Session Equipment Utilized During Treatment: Gait  belt Activity Tolerance: Patient tolerated treatment well Patient left: in chair;with call bell/phone within reach;with chair alarm set Nurse Communication: Mobility status PT Visit Diagnosis: Unsteadiness on feet (R26.81);Pain Pain - Right/Left: Right Pain - part of body: Knee     Time: 6808-8110 PT Time Calculation (min) (ACUTE ONLY): 27 min  Charges:  $Gait Training: 8-22 mins $Therapeutic Exercise: 8-22 mins                    G Codes:          Philomena Doheny 01/15/2018, 2:06 PM [phone]

## 2018-01-16 ENCOUNTER — Ambulatory Visit: Payer: Medicare HMO

## 2018-01-16 LAB — BASIC METABOLIC PANEL
ANION GAP: 8 (ref 5–15)
BUN: 9 mg/dL (ref 8–23)
CALCIUM: 7.7 mg/dL — AB (ref 8.9–10.3)
CO2: 22 mmol/L (ref 22–32)
Chloride: 103 mmol/L (ref 98–111)
Creatinine, Ser: 0.3 mg/dL — ABNORMAL LOW (ref 0.44–1.00)
GFR calc Af Amer: >60 mL/min (ref >60)
Glucose, Bld: 108 mg/dL — ABNORMAL HIGH (ref 70–99)
Potassium: 3.6 mmol/L (ref 3.5–5.1)
Sodium: 133 mmol/L — ABNORMAL LOW (ref 135–145)

## 2018-01-16 MED ORDER — TRAMADOL HCL 50 MG PO TABS: 50.0000 mg | ORAL_TABLET | Freq: Four times a day (QID) | ORAL | 0 refills | Status: DC | PRN

## 2018-01-16 MED ORDER — PANTOPRAZOLE SODIUM 40 MG PO TBEC: 40.0000 mg | DELAYED_RELEASE_TABLET | Freq: Every day | ORAL | 0 refills | Status: DC

## 2018-01-16 MED ORDER — IRBESARTAN 300 MG PO TABS
300.0000 mg | ORAL_TABLET | Freq: Every day | ORAL | Status: DC
  Administered 2018-01-16 – 2018-01-19 (×4): 300 mg via ORAL
  Filled 2018-01-16 (×4): qty 1

## 2018-01-16 MED ORDER — TELMISARTAN 80 MG PO TABS: 80.0000 mg | ORAL_TABLET | Freq: Every day | ORAL | 0 refills | Status: DC

## 2018-01-16 NOTE — Discharge Summary (Signed)
Physician Discharge Summary  Cassandra Sims:[id number] DOB: [birth year] DOA: 01/13/2018  PCP: Cassandra Sims., MD  Admit date: 01/13/2018 Discharge date: 01/16/2018  Time spent: 60 minutes  Recommendations for Outpatient Follow-up:  1. Follow-up with MD at skilled nursing facility.  Patient will need a basic metabolic profile done to follow-up on electrolytes and renal function. 2. Follow-up with Dr. Wynelle Sims, orthopedics as previously scheduled.   Discharge Diagnoses:  Principal Problem:   Hyponatremia Active Problems:   HTN (hypertension)   Arthritis   GERD (gastroesophageal reflux disease)   Hypokalemia   Prolonged QT interval   S/P knee replacement   Discharge Condition: Stable and improved  Diet recommendation: Regular  Filed Weights   01/13/18 1433  Weight: 54 kg (119 lb)    History of present illness:  Per Dr.Mathews  Cassandra Sims is a 84 y.o. female with medical history significant of recent right knee replacement discharged to rehab readmitted with confusion and weakness and abnormal labs.  Patient has not been eating or drinking much at the rehab.  She stated she did not like the food.  She had nausea and vomiting few days ago but had resolved at this time denied any diarrhea abdominal pain or constipation.  No fever chills chest pain shortness of breath or cough reported no urinary complaints.   ED Course: She received 40 potassium p.o. 10 potassium IV and was started on normal saline at 150 cc an hour.    Hospital Course:  1 hyponatremia Likely secondary to hypovolemic hyponatremia in the setting of HCTZ.  Patient noted to have poor oral intake prior to admission with a episode of nausea and emesis.  Urinalysis done was unremarkable.  Urine sodium was less than 10.  Urine creatinine of 36.5.  Patient placed on IV fluids with aggressive hydration with improvement during sodium levels.  Sodium levels improved such that by day of discharge sodium was up to  133 from 117 on admission.   IV fluids were saline locked. HCTZ has been discontinued and will not be resumed on discharge.    2.  Hypokalemia Likely secondary to GI losses and diuretics.  Patient noted to have a bout of nausea and emesis and also on HCTZ prior to admission.  HCTZ has been discontinued.  Repleted potassium.    3.  Prolonged QT interval Patient noted to have a prolonged QT interval on admission.  Patient's electrolytes were repleted adequately.  Repeat EKG showed resolution of prolonged QT interval.  4.  Status post right knee replacement Patient status post right knee replacement secondary to end-stage OA of the right knee with progressive worsening pain and dysfunction.  Patient underwent right total knee arthroplasty per Dr. Wynelle Sims 01/05/2018.  Orthopedics informed of patient's admission.  PT/OT.  Continued on aspirin twice daily.  Outpatient follow-up with orthopedics.   5.  Anemia H&H stable.  Anemia panel consistent with anemia of chronic disease.   6.  Glaucoma Placed on home eyedrops.  7.  Hypertension Blood pressure was borderline/soft on admission and as such patient's Norvasc was held.  BP improved and patient was started back on home regimen of Norvasc.  Patient's HCTZ discontinued during the hospitalization due to hyponatremia.  Patient started on Avapro.  Patient will be discharged on her home dose of Norvasc as well as micardis 80 mg daily.  Outpatient follow-up.       Procedures:  CXR 01/13/2018  Consultations:  None  Discharge Exam: Vitals:   01/16/18 0436 01/16/18  1326  BP: (!) 156/78 (!) 123/58  Pulse: 74 76  Resp: 18 18  Temp: 98.1 F (36.7 C) 98 F (36.7 C)  SpO2: 97% 100%    General: NAD Cardiovascular: RRR Respiratory: CTAB  Discharge Instructions   Discharge Instructions    Diet general   Complete by:  As directed    Increase activity slowly   Complete by:  As directed      Allergies as of 01/16/2018      Reactions    Penicillins Shortness Of Breath, Swelling   Has patient had a PCN reaction causing immediate rash, facial/tongue/throat swelling, SOB or lightheadedness with hypotension: Yes Has patient had a PCN reaction causing severe rash involving mucus membranes or skin necrosis: No Has patient had a PCN reaction that required hospitalization: No Has patient had a PCN reaction occurring within the last 10 years: No If all of the above answers are "NO", then may proceed with Cephalosporin use.   Adhesive [tape] Itching   BAND-AID TAPE-skin redness/itching   Clindamycin/lincomycin Other (See Comments)   Severe joint pain.   Codeine Nausea And Vomiting   Darvocet [propoxyphene N-acetaminophen] Other (See Comments)   HTN   Dilaudid [hydromorphone Hcl] Nausea And Vomiting   DROP BLOOD PRESSURE HYPOTENSIVE   Doxycycline Other (See Comments)   Pt states she tolerates OKAY   Flagyl [metronidazole] Other (See Comments)   Joint pains   Lactose Intolerance (gi)    Levofloxacin    Joint pain   Macrodantin [nitrofurantoin Macrocrystal]    Morphine And Related    hallucinate   Premarin [conjugated Estrogens] Itching   Tramadol Hcl Other (See Comments)   Causes insomnia   Zantac [ranitidine Hcl]    Itching    Zofran [ondansetron Hcl]    HTN   Nitrofuran Derivatives Rash   Sulfa Antibiotics Rash      Medication List    STOP taking these medications   oxyCODONE 5 MG immediate release tablet Commonly known as:  Oxy IR/ROXICODONE   telmisartan-hydrochlorothiazide 80-25 MG tablet Commonly known as:  MICARDIS HCT     TAKE these medications   acetaminophen 500 MG tablet Commonly known as:  TYLENOL Take 1,000 mg by mouth every 8 (eight) hours as needed for mild pain.   ACIDOPHILUS PO Take 1 capsule by mouth daily.   albuterol 108 (90 Base) MCG/ACT inhaler Commonly known as:  PROVENTIL HFA;VENTOLIN HFA Inhale 2 puffs into the lungs every 4 (four) hours as needed for wheezing. Reported on  10/10/2015   amLODipine 5 MG tablet Commonly known as:  NORVASC Take 5 mg by mouth every evening.   aspirin 325 MG EC tablet Take 1 tablet (325 mg total) by mouth 2 (two) times daily for 17 days. Take one tablet (325 mg) Aspirin two times a day for three weeks following surgery. Then take one baby Aspirin (81 mg) once a day for three weeks. Then discontinue aspirin.   Azelastine HCl 0.15 % Soln Place 1 spray into both nostrils 2 (two) times daily.   bimatoprost 0.01 % Soln Commonly known as:  LUMIGAN Place 1 drop into both eyes nightly.   bisacodyl 10 MG suppository Commonly known as:  DULCOLAX Place 1 suppository (10 mg total) rectally daily as needed for moderate constipation.   budesonide-formoterol 160-4.5 MCG/ACT inhaler Commonly known as:  SYMBICORT Inhale two puffs twice daily to prevent cough or wheeze.   CALTRATE 600+D3 PO Take 1 tablet by mouth daily.   cholecalciferol 1000 units tablet Commonly  known as:  VITAMIN D Take 1,000 Units by mouth daily.   docusate sodium 100 MG capsule Commonly known as:  COLACE Take 1 capsule (100 mg total) by mouth 2 (two) times daily.   FASENRA 30 MG/ML Sosy Generic drug:  Benralizumab Inject 30 mg into the skin every 8 (eight) weeks.   fluticasone 50 MCG/ACT nasal spray Commonly known as:  FLONASE Place 1 spray into both nostrils 2 (two) times daily as needed for allergies.   FUNGI-NAIL EX Apply 1 application topically daily. Applied to affected toe nail daily   LUTEIN 20 Caps Take 20 mg by mouth daily.   meloxicam 7.5 MG tablet Commonly known as:  MOBIC Take 7.5 mg by mouth daily.   methocarbamol 500 MG tablet Commonly known as:  ROBAXIN Take 1 tablet (500 mg total) by mouth every 6 (six) hours as needed for muscle spasms.   metoCLOPramide 5 MG tablet Commonly known as:  REGLAN Take 1-2 tablets (5-10 mg total) by mouth every 8 (eight) hours as needed for nausea (if ondansetron (ZOFRAN) ineffective.).    mometasone 0.1 % ointment Commonly known as:  ELOCON Apply topically 2 (two) times daily. What changed:    how much to take  when to take this  reasons to take this   mometasone-formoterol 100-5 MCG/ACT Aero Commonly known as:  DULERA Take 2 puffs first thing in am and then another 2 puffs about 12 hours later. What changed:    how much to take  how to take this  when to take this  additional instructions  Another medication with the same name was removed. Continue taking this medication, and follow the directions you see here.   montelukast 10 MG tablet Commonly known as:  SINGULAIR Take 10 mg by mouth at bedtime.   multivitamin with minerals Tabs tablet Take 1 tablet by mouth daily. Nature Made Multi Vitamin for Her 50+ W/No Iron   NONFORMULARY OR COMPOUNDED ITEM Place 1 drop into both eyes 4 (four) times daily. Autologous serum eye drops (ASED) made specifically for patient from patient's own blood serum and plasma   ondansetron 4 MG tablet Commonly known as:  ZOFRAN Take 1 tablet (4 mg total) by mouth every 6 (six) hours as needed for nausea.   OXYGEN Inhale into the lungs. 2 liters for shortness of breath or chest pain   pantoprazole 40 MG tablet Commonly known as:  PROTONIX Take 1 tablet (40 mg total) by mouth daily at 6 (six) AM. Start taking on:  01/17/2018   polyethylene glycol packet Commonly known as:  MIRALAX / GLYCOLAX Take 17 g by mouth daily as needed for mild constipation.   psyllium 58.6 % powder Commonly known as:  METAMUCIL Take by mouth daily. 14.3 g (1 heaping tablespoon) every morning.   REFRESH TEARS 0.5 % Soln Generic drug:  carboxymethylcellulose Place 1 drop into both eyes 5 (five) times daily as needed (for dry eyes).   sodium phosphate 7-19 GM/118ML Enem Place 133 mLs (1 enema total) rectally once as needed for severe constipation.   tacrolimus 0.1 % ointment Commonly known as:  PROTOPIC Apply topically 2 (two) times  daily. What changed:    how much to take  when to take this  reasons to take this   telmisartan 80 MG tablet Commonly known as:  MICARDIS Take 1 tablet (80 mg total) by mouth daily.   traMADol 50 MG tablet Commonly known as:  ULTRAM Take 1-2 tablets (50-100 mg total) by mouth  every 6 (six) hours as needed for moderate pain (Give second, if unresponsive to oxycodone.).   Turmeric 450 MG Caps Take 450 mg by mouth daily.      Allergies  Allergen Reactions  . Penicillins Shortness Of Breath and Swelling    Has patient had a PCN reaction causing immediate rash, facial/tongue/throat swelling, SOB or lightheadedness with hypotension: Yes Has patient had a PCN reaction causing severe rash involving mucus membranes or skin necrosis: No Has patient had a PCN reaction that required hospitalization: No Has patient had a PCN reaction occurring within the last 10 years: No If all of the above answers are "NO", then may proceed with Cephalosporin use.   . Adhesive [Tape] Itching    BAND-AID TAPE-skin redness/itching  . Clindamycin/Lincomycin Other (See Comments)    Severe joint pain.  . Codeine Nausea And Vomiting  . Darvocet [Propoxyphene N-Acetaminophen] Other (See Comments)    HTN  . Dilaudid [Hydromorphone Hcl] Nausea And Vomiting    DROP BLOOD PRESSURE HYPOTENSIVE  . Doxycycline Other (See Comments)    Pt states she tolerates OKAY  . Flagyl [Metronidazole] Other (See Comments)    Joint pains  . Lactose Intolerance (Gi)   . Levofloxacin     Joint pain  . Macrodantin [Nitrofurantoin Macrocrystal]   . Morphine And Related     hallucinate  . Premarin [Conjugated Estrogens] Itching  . Tramadol Hcl Other (See Comments)    Causes insomnia  . Zantac [Ranitidine Hcl]     Itching   . Zofran [Ondansetron Hcl]     HTN  . Nitrofuran Derivatives Rash  . Sulfa Antibiotics Rash   Follow-up Information    MD AT SNF Follow up.        Gaynelle Arabian, MD Follow up.   Specialty:   Orthopedic Surgery Why:  F/U AS SCHEDULED. Contact information: [address] [place] [address] [phone]            The results of significant diagnostics from this hospitalization (including imaging, microbiology, ancillary and laboratory) are listed below for reference.    Significant Diagnostic Studies: Dg Chest 2 View  Result Date: 01/13/2018 CLINICAL DATA:  Weakness and confusion following knee surgery 2 days ago. EXAM: CHEST - 2 VIEW COMPARISON:  09/12/2017 FINDINGS: Chronic elevation of the left hemidiaphragm. Chronic volume loss at both lung bases secondary to that. Volume loss at the right base may be slightly above baseline. The upper lungs are clear. No acute bone finding. IMPRESSION: Chronic elevation of the left hemidiaphragm with chronic left lower lung volume loss. Mild volume loss at the right lung base, probably slightly increased above baseline. Electronically Signed   By: Nelson Chimes M.D.   On: 01/13/2018 15:53    Microbiology: No results found for this or any previous visit (from the past 240 hour(s)).   Labs: Basic Metabolic Panel: Recent Labs  Lab 01/13/18 1500 01/14/18 0329 01/14/18 1434 01/15/18 0312 01/15/18 1455 01/16/18 0404  NA 117* 126* 125* 128* 127* 133*  K 2.2* 2.8* 3.3* 3.9 3.7 3.6  CL 73* 87* 88* 97* 97* 103  CO2 33* 30 29 25 25 22   GLUCOSE 136* 104* 154* 107* 142* 108*  BUN 20 13 14 10 13 9   CREATININE 0.69 0.40* 0.49 0.36* 0.48 0.30*  CALCIUM 8.6* 8.2* 7.4* 7.5* 7.5* 7.7*  MG 1.9 1.7  --  2.1  --   --    Liver Function Tests: Recent Labs  Lab 01/13/18 1500  AST  29  ALT 44  ALKPHOS 108  BILITOT 1.5*  PROT 5.7*  ALBUMIN 2.9*   No results for input(s): LIPASE, AMYLASE in the last 168 hours. No results for input(s): AMMONIA in the last 168 hours. CBC: Recent Labs  Lab 01/13/18 1500 01/14/18 0329 01/15/18 0312  WBC 10.8* 12.3* 10.1  HGB 10.0* 9.8* 9.3*  HCT 28.1* 27.6* 27.0*  MCV 85.9 86.0 89.4   PLT 315 302 350   Cardiac Enzymes: No results for input(s): CKTOTAL, CKMB, CKMBINDEX, TROPONINI in the last 168 hours. BNP: BNP (last 3 results) Recent Labs    10/06/17 1426  BNP 51.2    ProBNP (last 3 results) No results for input(s): PROBNP in the last 8760 hours.  CBG: No results for input(s): GLUCAP in the last 168 hours.     Signed:  Irine Seal MD.  [facility] 01/16/2018, 2:55 PM

## 2018-01-16 NOTE — Care Management Important Message (Signed)
Important Message  Patient Details  Name: Cassandra Sims MRN: [id number] Date of Birth: 03/31/1933   Medicare Important Message Given:  Yes    Kerin Salen 01/16/2018, 10:02 AMImportant Message  Patient Details  Name: Cassandra Sims MRN: [id number] Date of Birth: 1933-06-23   Medicare Important Message Given:  Yes    Kerin Salen 01/16/2018, 10:02 AM

## 2018-01-16 NOTE — Progress Notes (Signed)
Physical Therapy Treatment Patient Details Name: Cassandra Sims MRN: [id number] DOB: [birth year] Today's Date: 01/16/2018    History of Present Illness Pt admitted from short term SNF level rehab with abnormal labs.  Pt with hx of recent R TKR 01/05/18    PT Comments    Pt has been using external catheter, encouraged pt/daughter to have pt start walking to the bathroom (with supervision) rather than using the catheter as increasing her mobility will speed her recovery from TKA. She tolerated increased distance of 54' with ambulation today.  Performed R TKA exercises with min assist. R knee AAROM 5-70*. Premedicated with tylenol today which allowed for improved tolerance of exercises. Ice applied to R knee at end of PT session.    Follow Up Recommendations  SNF     Equipment Recommendations  None recommended by PT    Recommendations for Other Services       Precautions / Restrictions Precautions Precautions: Knee;Fall Precaution Comments: reviewed no pillow under knee Restrictions Weight Bearing Restrictions: No RLE Weight Bearing: Weight bearing as tolerated    Mobility  Bed Mobility Overal bed mobility: Needs Assistance Bed Mobility: Supine to Sit     Supine to sit: Min assist     General bed mobility comments: min A to raise trunk  Transfers Overall transfer level: Needs assistance Equipment used: Rolling walker (2 wheeled) Transfers: Sit to/from Stand Sit to Stand: Min assist         General transfer comment: cues for hand placement, min A to rise  Ambulation/Gait Ambulation/Gait assistance: Min guard Gait Distance (Feet): 68 Feet Assistive device: Rolling walker (2 wheeled) Gait Pattern/deviations: Step-to pattern;Step-through pattern;Decreased step length - right;Decreased step length - left;Shuffle;Trunk flexed Gait velocity: decreased   General Gait Details: cues for initial sequence.  Distance ltd by pt fatigue    Stairs              Wheelchair Mobility    Modified Rankin (Stroke Patients Only)       Balance Overall balance assessment: Needs assistance Sitting-balance support: Feet supported;No upper extremity supported Sitting balance-Leahy Scale: Fair     Standing balance support: Bilateral upper extremity supported Standing balance-Leahy Scale: Poor                              Cognition Arousal/Alertness: Awake/alert Behavior During Therapy: WFL for tasks assessed/performed Overall Cognitive Status: Within Functional Limits for tasks assessed                                        Exercises Total Joint Exercises Ankle Circles/Pumps: AROM;Both;15 reps;Supine Quad Sets: AROM;Both;10 reps Short Arc Quad: AROM;AAROM;Right;20 reps Heel Slides: AAROM;Right;Supine;20 reps Hip ABduction/ADduction: AAROM;Right;Supine;15 reps Straight Leg Raises: Right;AAROM;15 reps Long Arc Quad: AAROM;Right;15 reps;Seated Knee Flexion: AAROM;AROM;Right;10 reps;Seated Goniometric ROM: 5-70* AAROM R knee    General Comments        Pertinent Vitals/Pain Pain Score: 3  Pain Location: R knee with walking Pain Descriptors / Indicators: Aching;Sore Pain Intervention(s): Limited activity within patient's tolerance;Monitored during session;Premedicated before session;Ice applied    Home Living                      Prior Function            PT Goals (current goals can now be found in the care  plan section) Acute Rehab PT Goals Patient Stated Goal: Regain IND PT Goal Formulation: With patient/family Time For Goal Achievement: 01/21/18 Potential to Achieve Goals: Good Progress towards PT goals: Progressing toward goals    Frequency    Min 5X/week      PT Plan Current plan remains appropriate    Co-evaluation              AM-PAC PT "6 Clicks" Daily Activity  Outcome Measure  Difficulty turning over in bed (including adjusting bedclothes, sheets and  blankets)?: A Little Difficulty moving from lying on back to sitting on the side of the bed? : Unable Difficulty sitting down on and standing up from a chair with arms (e.g., wheelchair, bedside commode, etc,.)?: Unable Help needed moving to and from a bed to chair (including a wheelchair)?: A Little Help needed walking in hospital room?: A Little Help needed climbing 3-5 steps with a railing? : A Lot 6 Click Score: 13    End of Session Equipment Utilized During Treatment: Gait belt Activity Tolerance: Patient tolerated treatment well Patient left: in chair;with call bell/phone within reach;with family/visitor present Nurse Communication: Mobility status PT Visit Diagnosis: Unsteadiness on feet (R26.81);Pain Pain - Right/Left: Right Pain - part of body: Knee     Time: 3570-1779 PT Time Calculation (min) (ACUTE ONLY): 34 min  Charges:  $Gait Training: 8-22 mins $Therapeutic Exercise: 8-22 mins                    G Codes:          Philomena Doheny 01/16/2018, 11:43 AM [phone]

## 2018-01-16 NOTE — Progress Notes (Signed)
Spoke with [facility]- has initiated [employer] authorization request in order to approve pt returning to SNF to continue her short term rehab therapy (admitted to [facility] following recent DC from [facility] 01/09/18- was readmitted 01/13/18 for hyponatremia). Plan for pt to return to [facility] at this time is pending insurance approval for readmission to SNF.   Sharren Bridge, MSW, LCSW Clinical Social Work 01/16/2018 [phone]

## 2018-01-17 LAB — BASIC METABOLIC PANEL
Anion gap: 5 (ref 5–15)
BUN: 9 mg/dL (ref 8–23)
CHLORIDE: 101 mmol/L (ref 98–111)
CO2: 27 mmol/L (ref 22–32)
Calcium: 8 mg/dL — ABNORMAL LOW (ref 8.9–10.3)
Creatinine, Ser: 0.39 mg/dL — ABNORMAL LOW (ref 0.44–1.00)
GFR calc Af Amer: >60 mL/min (ref >60)
Glucose, Bld: 109 mg/dL — ABNORMAL HIGH (ref 70–99)
POTASSIUM: 3.4 mmol/L — AB (ref 3.5–5.1)
SODIUM: 133 mmol/L — AB (ref 135–145)

## 2018-01-17 MED ORDER — LORAZEPAM 0.5 MG PO TABS
0.5000 mg | ORAL_TABLET | Freq: Every evening | ORAL | Status: DC | PRN
  Administered 2018-01-18: 0.5 mg via ORAL
  Filled 2018-01-17: qty 1

## 2018-01-17 MED ORDER — POTASSIUM CHLORIDE CRYS ER 20 MEQ PO TBCR
40.0000 meq | EXTENDED_RELEASE_TABLET | Freq: Once | ORAL | Status: AC
  Administered 2018-01-17: 40 meq via ORAL
  Filled 2018-01-17: qty 2

## 2018-01-17 NOTE — Progress Notes (Signed)
Physical Therapy Treatment Patient Details Name: Cassandra Sims MRN: [id number] DOB: 01/12/1933 Today's Date: 01/17/2018    History of Present Illness Pt admitted from short term SNF level rehab with abnormal labs.  Pt with hx of recent R TKR 01/05/18    PT Comments    Patient presenting supine in bed - motivated to work with PT this AM. Patient continues to require Min A for transfers from various levels and surfaces. Limited gait due to fatigue - requires consistent verbal cueing for improved step length and weight shift to progress to a more normalized gait pattern. Pt recommendations remain appropriate.   Follow Up Recommendations  SNF     Equipment Recommendations  None recommended by PT    Recommendations for Other Services       Precautions / Restrictions Precautions Precautions: Knee;Fall Restrictions Weight Bearing Restrictions: No RLE Weight Bearing: Weight bearing as tolerated    Mobility  Bed Mobility Overal bed mobility: Needs Assistance Bed Mobility: Supine to Sit     Supine to sit: Min assist     General bed mobility comments: Min A for LE management and trunk control  Transfers Overall transfer level: Needs assistance Equipment used: Rolling walker (2 wheeled) Transfers: Sit to/from Stand Sit to Stand: Min assist         General transfer comment: Min A from bedside and toilet to power up; verbal cueing for hand placement for safety and efficiency of transfer  Ambulation/Gait Ambulation/Gait assistance: Min guard Gait Distance (Feet): 60 Feet Assistive device: Rolling walker (2 wheeled) Gait Pattern/deviations: Step-to pattern;Decreased step length - left;Decreased stance time - right;Decreased weight shift to right;Antalgic Gait velocity: decreased   General Gait Details: verbal cueing for improved step length with limited carryover   Stairs             Wheelchair Mobility    Modified Rankin (Stroke Patients Only)        Balance Overall balance assessment: Needs assistance Sitting-balance support: Feet supported;No upper extremity supported Sitting balance-Leahy Scale: Fair     Standing balance support: Bilateral upper extremity supported;During functional activity Standing balance-Leahy Scale: Poor                              Cognition Arousal/Alertness: Awake/alert Behavior During Therapy: WFL for tasks assessed/performed Overall Cognitive Status: Within Functional Limits for tasks assessed                                        Exercises      General Comments        Pertinent Vitals/Pain Pain Assessment: No/denies pain    Home Living                      Prior Function            PT Goals (current goals can now be found in the care plan section) Acute Rehab PT Goals Patient Stated Goal: Regain IND PT Goal Formulation: With patient/family Time For Goal Achievement: 01/21/18 Potential to Achieve Goals: Good Progress towards PT goals: Progressing toward goals    Frequency    Min 5X/week      PT Plan Current plan remains appropriate    Co-evaluation              AM-PAC PT "6 Clicks" Daily Activity  Outcome Measure  Difficulty turning over in bed (including adjusting bedclothes, sheets and blankets)?: A Little Difficulty moving from lying on back to sitting on the side of the bed? : Unable Difficulty sitting down on and standing up from a chair with arms (e.g., wheelchair, bedside commode, etc,.)?: Unable Help needed moving to and from a bed to chair (including a wheelchair)?: A Little Help needed walking in hospital room?: A Little Help needed climbing 3-5 steps with a railing? : A Lot 6 Click Score: 13    End of Session Equipment Utilized During Treatment: Gait belt Activity Tolerance: Patient tolerated treatment well Patient left: in chair;with call bell/phone within reach Nurse Communication: Mobility status PT Visit  Diagnosis: Unsteadiness on feet (R26.81);Pain Pain - Right/Left: Right Pain - part of body: Knee     Time: 8003-4917 PT Time Calculation (min) (ACUTE ONLY): 27 min  Charges:  $Gait Training: 8-22 mins $Therapeutic Activity: 8-22 mins                    G Codes:        Lanney Gins, PT, DPT 01/17/18 8:47 AM

## 2018-01-17 NOTE — Progress Notes (Signed)
PROGRESS NOTE    Cassandra Sims  FUX:[id number] DOB: 1933-04-17 DOA: 01/13/2018 PCP: Lilian Coma., MD   Brief Narrative:  84 year old female history of recent right knee replacement and discharged to a skilled nursing facility presented back with confusion, weakness, significant hyponatremia, hypokalemia.  Patient also noted to have QTC prolongation.  Blood pressure was borderline and as such Norvasc held.   Assessment & Plan:   Principal Problem:   Hyponatremia Active Problems:   HTN (hypertension)   Arthritis   GERD (gastroesophageal reflux disease)   Hypokalemia   Prolonged QT interval   S/P knee replacement  #1 hyponatremia Likely secondary to hypovolemic hyponatremia in the setting of HCTZ.  Patient noted to have poor oral intake prior to admission with a episode of nausea and emesis.  Urinalysis done was unremarkable.  Urine sodium was less than 10.  Urine creatinine of 36.5.  Patient placed on IV fluids with aggressive hydration with improvement during sodium levels.  Sodium currently at 133 from 133 from 127 from 128 from 126 from 117 on admission.  HCTZ has been discontinued and will not be resumed on discharge.  IV fluids have been saline locked.  Follow.   2.  Hypokalemia Likely secondary to GI losses and diuretics.  Patient noted to have a bout of nausea and emesis and also on HCTZ prior to admission.  HCTZ has been discontinued.  Replete potassium.  Follow.   3.  Prolonged QT interval Repeat EKG resolution of QT prolongation.  Keep electrolytes repleted.  Follow.   4.  Status post right knee replacement Patient status post right knee replacement secondary to end-stage OA of the right knee with progressive worsening pain and dysfunction.  Patient underwent right total knee arthroplasty per Dr. Wynelle Link 01/05/2018.  Orthopedics informed of patient's admission.  PT/OT.  Continue aspirin twice daily.  Outpatient follow-up with orthopedics.   5.  Anemia H&H stable.   Anemia panel consistent with anemia of chronic disease.  Follow.    6.  Glaucoma Continue home eyedrops.  7.  Hypertension Blood pressure was borderline/soft on admission and as such patient's Norvasc was held.  BP improved and currently somewhat elevated.  Continue Norvasc.  Avapro dose increased to 300 mg daily.  Follow.     DVT prophylaxis: Aspirin/Lovenox Code Status: Full Family Communication: Updated patient and daughter and grandson at bedside. Disposition Plan: Skilled nursing facility when bed available.     Consultants:   None  Procedures:   Chest x-ray 01/13/2018  Antimicrobials:   None   Subjective: Patient sitting up in chair.  States was given some sleep medication last night and slept very well and requesting medication to be repeated tonight.  No shortness of breath.  No chest pain.  No nausea.  No vomiting.  Feeling better than on admission.   Objective: Vitals:   01/16/18 2021 01/17/18 0454 01/17/18 0956 01/17/18 1101  BP:  120/75  121/66  Pulse:  66  79  Resp:  20    Temp:  98.7 F (37.1 C)    TempSrc:  Oral    SpO2: 99% 96% 98%   Weight:      Height:        Intake/Output Summary (Last 24 hours) at 01/17/2018 1230 Last data filed at 01/17/2018 1049 Gross per 24 hour  Intake 360 ml  Output 2400 ml  Net -2040 ml   Filed Weights   01/13/18 1433  Weight: 54 kg (119 lb)    Examination:  General exam: NAD. Respiratory system: Lungs clear to auscultation bilaterally.  No wheezes, no crackles, no rhonchi.  Normal respiratory effort. Cardiovascular system: RRR no murmurs rubs or gallops.  No JVD.  No lower extremity edema.  Gastrointestinal system: Abdomen is nontender, nondistended, soft, positive bowel sounds.  No rebound.  No guarding.   Central nervous system: Alert and oriented. No focal neurological deficits. Extremities: Right lower extremity knee with less swelling and some erythema around the right knee with Steri-Strips noted.  Some  ecchymosis in posterior lower thigh region.   Skin: No rashes, lesions or ulcers Psychiatry: Judgement and insight appear normal. Mood & affect appropriate.     Data Reviewed: I have personally reviewed following labs and imaging studies  CBC: Recent Labs  Lab 01/13/18 1500 01/14/18 0329 01/15/18 0312  WBC 10.8* 12.3* 10.1  HGB 10.0* 9.8* 9.3*  HCT 28.1* 27.6* 27.0*  MCV 85.9 86.0 89.4  PLT 315 302 505   Basic Metabolic Panel: Recent Labs  Lab 01/13/18 1500 01/14/18 0329 01/14/18 1434 01/15/18 0312 01/15/18 1455 01/16/18 0404 01/17/18 0401  NA 117* 126* 125* 128* 127* 133* 133*  K 2.2* 2.8* 3.3* 3.9 3.7 3.6 3.4*  CL 73* 87* 88* 97* 97* 103 101  CO2 33* 30 29 25 25 22 27   GLUCOSE 136* 104* 154* 107* 142* 108* 109*  BUN 20 13 14 10 13 9 9   CREATININE 0.69 0.40* 0.49 0.36* 0.48 0.30* 0.39*  CALCIUM 8.6* 8.2* 7.4* 7.5* 7.5* 7.7* 8.0*  MG 1.9 1.7  --  2.1  --   --   --    GFR: Estimated Creatinine Clearance: 43.9 mL/min (A) (by C-G formula based on SCr of 0.39 mg/dL (L)). Liver Function Tests: Recent Labs  Lab 01/13/18 1500  AST 29  ALT 44  ALKPHOS 108  BILITOT 1.5*  PROT 5.7*  ALBUMIN 2.9*   No results for input(s): LIPASE, AMYLASE in the last 168 hours. No results for input(s): AMMONIA in the last 168 hours. Coagulation Profile: No results for input(s): INR, PROTIME in the last 168 hours. Cardiac Enzymes: No results for input(s): CKTOTAL, CKMB, CKMBINDEX, TROPONINI in the last 168 hours. BNP (last 3 results) No results for input(s): PROBNP in the last 8760 hours. HbA1C: No results for input(s): HGBA1C in the last 72 hours. CBG: No results for input(s): GLUCAP in the last 168 hours. Lipid Profile: No results for input(s): CHOL, HDL, LDLCALC, TRIG, CHOLHDL, LDLDIRECT in the last 72 hours. Thyroid Function Tests: No results for input(s): TSH, T4TOTAL, FREET4, T3FREE, THYROIDAB in the last 72 hours. Anemia Panel: Recent Labs    01/15/18 0312    VITAMINB12 1,940*  FOLATE 10.6  FERRITIN 243  TIBC 275  IRON 34   Sepsis Labs: Recent Labs  Lab 01/13/18 1510  LATICACIDVEN 1.73    No results found for this or any previous visit (from the past 240 hour(s)).       Radiology Studies: No results found.      Scheduled Meds: . amLODipine  5 mg Oral Daily  . aspirin  325 mg Oral BID  . azelastine  1 spray Each Nare BID  . calcium-vitamin D   Oral Daily  . cholecalciferol  1,000 Units Oral Daily  . docusate sodium  100 mg Oral BID  . enoxaparin (LOVENOX) injection  40 mg Subcutaneous Q24H  . ice pack  1 each Other Q24H  . irbesartan  300 mg Oral Daily  . latanoprost  1 drop Both Eyes  QHS  . meloxicam  7.5 mg Oral Daily  . mometasone-formoterol  2 puff Inhalation BID  . montelukast  10 mg Oral QHS  . multivitamin  1 tablet Oral Daily  . pantoprazole  40 mg Oral Q0600  . psyllium  1 packet Oral Daily  . home med stored in pyxis  1 each Both Eyes QID   Continuous Infusions:    LOS: 4 days    Time spent: 35 minutes    Irine Seal, MD [facility] Pager [phone]  If 7PM-7AM, please contact night-coverage [email or web address] Password [email or web address] 01/17/2018, 12:30 PM

## 2018-01-17 NOTE — Progress Notes (Signed)
CSW contacted [facility] to inquire about patient's [employer] authorization, staff member Loree Fee reported that patient has still not received insurance authorization.  Barrier to discharge  [employer] authorization  CSW will continue to follow and assist with discharge planning.  Abundio Miu, [place] Social Worker [facility] Cell#: [phone]

## 2018-01-17 NOTE — Progress Notes (Signed)
Pt and family waiting for discharge since early morning. Discharge pending insurance coverage for SNF per SW. Message left for SW, no reply. Numerous calls to SNF, not able to get thru to manager or nurse, MD called and made aware, per MD OK to discharge in a.m.Marland Kitchen

## 2018-01-18 LAB — BASIC METABOLIC PANEL
ANION GAP: 6 (ref 5–15)
BUN: 17 mg/dL (ref 8–23)
CALCIUM: 8.4 mg/dL — AB (ref 8.9–10.3)
CO2: 26 mmol/L (ref 22–32)
CREATININE: 0.4 mg/dL — AB (ref 0.44–1.00)
Chloride: 103 mmol/L (ref 98–111)
GFR calc Af Amer: >60 mL/min (ref >60)
GLUCOSE: 102 mg/dL — AB (ref 70–99)
Potassium: 4.2 mmol/L (ref 3.5–5.1)
Sodium: 135 mmol/L (ref 135–145)

## 2018-01-18 NOTE — Progress Notes (Signed)
PROGRESS NOTE    MIYANI CRONIC  XAJ:[id number] DOB: [birth year] DOA: 01/13/2018 PCP: Lilian Coma., MD   Brief Narrative:  84 year old female history of recent right knee replacement and discharged to a skilled nursing facility presented back with confusion, weakness, significant hyponatremia, hypokalemia.  Patient also noted to have QTC prolongation.  Blood pressure was borderline and as such Norvasc held.   Assessment & Plan:   Principal Problem:   Hyponatremia Active Problems:   HTN (hypertension)   Arthritis   GERD (gastroesophageal reflux disease)   Hypokalemia   Prolonged QT interval   S/P knee replacement  #1 hyponatremia Likely secondary to hypovolemic hyponatremia in the setting of HCTZ.  Patient noted to have poor oral intake prior to admission with a episode of nausea and emesis.  Urinalysis done was unremarkable.  Urine sodium was less than 10.  Urine creatinine of 36.5.  Patient placed on IV fluids with aggressive hydration with improvement during sodium levels.  Sodium currently at 135 from 133 from 133 from 127 from 128 from 126 from 117 on admission.  HCTZ has been discontinued and will not be resumed on discharge.  IV fluids have been saline locked.  Follow.   2.  Hypokalemia Likely secondary to GI losses and diuretics.  Patient noted to have a bout of nausea and emesis and also on HCTZ prior to admission.  HCTZ has been discontinued.  Repleted potassium.  Follow.   3.  Prolonged QT interval Repeat EKG resolution of QT prolongation.  Keep electrolytes repleted.  Follow.   4.  Status post right knee replacement Patient status post right knee replacement secondary to end-stage OA of the right knee with progressive worsening pain and dysfunction.  Patient underwent right total knee arthroplasty per Dr. Wynelle Link 01/05/2018.  Orthopedics informed of patient's admission.  PT/OT.  Continue aspirin twice daily.  Outpatient follow-up with orthopedics.   5.  Anemia H&H  stable.  Anemia panel consistent with anemia of chronic disease.  Follow.    6.  Glaucoma Continue home eyedrops.  7.  Hypertension Blood pressure was borderline/soft on admission and as such patient's Norvasc was held.  BP improved.  Continue current regimen of Norvasc and Avapro.   DVT prophylaxis: Aspirin/Lovenox Code Status: Full Family Communication: Updated patient and daughter at bedside. Disposition Plan: Skilled nursing facility when bed available.     Consultants:   None  Procedures:   Chest x-ray 01/13/2018  Antimicrobials:   None   Subjective: Patient is any chest pain.  No shortness of breath.  No nausea or emesis.  Feels better.    Objective: Vitals:   01/17/18 2137 01/18/18 0500 01/18/18 0933 01/18/18 0937  BP: 132/70 135/74    Pulse: 80 77    Resp: 20 16    Temp: 98 F (36.7 C) 98.8 F (37.1 C)    TempSrc: Oral Oral    SpO2: 96% 94% 94% 94%  Weight:      Height:        Intake/Output Summary (Last 24 hours) at 01/18/2018 1155 Last data filed at 01/18/2018 0500 Gross per 24 hour  Intake 360 ml  Output 3000 ml  Net -2640 ml   Filed Weights   01/13/18 1433  Weight: 54 kg (119 lb)    Examination:  General exam: NAD. Respiratory system: CTAB.  No wheezes, no crackles, no rhonchi.  Normal respiratory effort.  Cardiovascular system: Regular rate and rhythm no murmurs rubs or gallops.  No JVD.  No  lower extremity edema. Gastrointestinal system: Abdomen is soft, nontender, nondistended, positive bowel sounds.  No rebound.  No guarding. Central nervous system: Alert and oriented. No focal neurological deficits. Extremities: Right lower extremity knee with less swelling and some erythema around the right knee with Steri-Strips noted.  Some ecchymosis in posterior lower thigh region.   Skin: No rashes, lesions or ulcers Psychiatry: Judgement and insight appear normal. Mood & affect appropriate.     Data Reviewed: I have personally reviewed  following labs and imaging studies  CBC: Recent Labs  Lab 01/13/18 1500 01/14/18 0329 01/15/18 0312  WBC 10.8* 12.3* 10.1  HGB 10.0* 9.8* 9.3*  HCT 28.1* 27.6* 27.0*  MCV 85.9 86.0 89.4  PLT 315 302 676   Basic Metabolic Panel: Recent Labs  Lab 01/13/18 1500 01/14/18 0329  01/15/18 0312 01/15/18 1455 01/16/18 0404 01/17/18 0401 01/18/18 0410  NA 117* 126*   < > 128* 127* 133* 133* 135  K 2.2* 2.8*   < > 3.9 3.7 3.6 3.4* 4.2  CL 73* 87*   < > 97* 97* 103 101 103  CO2 33* 30   < > 25 25 22 27 26   GLUCOSE 136* 104*   < > 107* 142* 108* 109* 102*  BUN 20 13   < > 10 13 9 9 17   CREATININE 0.69 0.40*   < > 0.36* 0.48 0.30* 0.39* 0.40*  CALCIUM 8.6* 8.2*   < > 7.5* 7.5* 7.7* 8.0* 8.4*  MG 1.9 1.7  --  2.1  --   --   --   --    < > = values in this interval not displayed.   GFR: Estimated Creatinine Clearance: 43.9 mL/min (A) (by C-G formula based on SCr of 0.4 mg/dL (L)). Liver Function Tests: Recent Labs  Lab 01/13/18 1500  AST 29  ALT 44  ALKPHOS 108  BILITOT 1.5*  PROT 5.7*  ALBUMIN 2.9*   No results for input(s): LIPASE, AMYLASE in the last 168 hours. No results for input(s): AMMONIA in the last 168 hours. Coagulation Profile: No results for input(s): INR, PROTIME in the last 168 hours. Cardiac Enzymes: No results for input(s): CKTOTAL, CKMB, CKMBINDEX, TROPONINI in the last 168 hours. BNP (last 3 results) No results for input(s): PROBNP in the last 8760 hours. HbA1C: No results for input(s): HGBA1C in the last 72 hours. CBG: No results for input(s): GLUCAP in the last 168 hours. Lipid Profile: No results for input(s): CHOL, HDL, LDLCALC, TRIG, CHOLHDL, LDLDIRECT in the last 72 hours. Thyroid Function Tests: No results for input(s): TSH, T4TOTAL, FREET4, T3FREE, THYROIDAB in the last 72 hours. Anemia Panel: No results for input(s): VITAMINB12, FOLATE, FERRITIN, TIBC, IRON, RETICCTPCT in the last 72 hours. Sepsis Labs: Recent Labs  Lab 01/13/18 1510    LATICACIDVEN 1.73    No results found for this or any previous visit (from the past 240 hour(s)).       Radiology Studies: No results found.      Scheduled Meds: . amLODipine  5 mg Oral Daily  . aspirin  325 mg Oral BID  . azelastine  1 spray Each Nare BID  . calcium-vitamin D   Oral Daily  . cholecalciferol  1,000 Units Oral Daily  . docusate sodium  100 mg Oral BID  . enoxaparin (LOVENOX) injection  40 mg Subcutaneous Q24H  . ice pack  1 each Other Q24H  . irbesartan  300 mg Oral Daily  . latanoprost  1 drop Both  Eyes QHS  . meloxicam  7.5 mg Oral Daily  . mometasone-formoterol  2 puff Inhalation BID  . montelukast  10 mg Oral QHS  . multivitamin  1 tablet Oral Daily  . pantoprazole  40 mg Oral Q0600  . psyllium  1 packet Oral Daily  . home med stored in pyxis  1 each Both Eyes QID   Continuous Infusions:    LOS: 5 days    Time spent: 35 minutes    Irine Seal, MD [facility] Pager [phone]  If 7PM-7AM, please contact night-coverage [email or web address] Password [email or web address] 01/18/2018, 11:55 AM

## 2018-01-19 MED ORDER — LORAZEPAM 0.5 MG PO TABS
0.2500 mg | ORAL_TABLET | Freq: Once | ORAL | Status: AC
Start: 2018-01-19 — End: 2018-01-19
  Administered 2018-01-19: 0.25 mg via ORAL
  Filled 2018-01-19: qty 1

## 2018-01-19 MED ORDER — LORAZEPAM 0.5 MG PO TABS: 0.5000 mg | ORAL_TABLET | Freq: Every evening | ORAL | 0 refills | Status: DC | PRN

## 2018-01-19 NOTE — Progress Notes (Signed)
Called report to Ms. York, RN from [facility]. EMS has been dispatched, pt ready and prepared for discharge. Eye drops returned to patient. SRP, RN

## 2018-01-19 NOTE — Progress Notes (Signed)
Patient has bed at [facility].   Patient will return to continue rehab.   LCSW confirmed bed. Room 7003  Facility has [employer].   LCSW faxed dc docs to facility.   Patient will transport by [facility].   RN Report #: [phone]  Servando Snare, Shawna Clamp [place] [phone]

## 2018-01-19 NOTE — Progress Notes (Signed)
Physical Therapy Treatment Patient Details Name: Cassandra Sims MRN: [id number] DOB: 1932-12-28 Today's Date: 01/19/2018    History of Present Illness Pt admitted from short term SNF level rehab with abnormal labs.  Pt with hx of recent R TKR 01/05/18    PT Comments    Patient progressing with mobility and ROM/strength with exercises.  Generalized weakness limited ambulation this session and pt already walking in hallway with nursing earlier this pm.   Feel she is stable for d/c to SNF when stable and bed available.   Follow Up Recommendations  SNF;Supervision/Assistance - 24 hour     Equipment Recommendations  Rolling walker with 5" wheels    Recommendations for Other Services       Precautions / Restrictions Precautions Precautions: Knee;Fall Precaution Comments: reviewed no pillow under knee Restrictions RLE Weight Bearing: Weight bearing as tolerated    Mobility  Bed Mobility Overal bed mobility: Needs Assistance       Supine to sit: Supervision Sit to supine: Supervision   General bed mobility comments: use of rail and HOB elevated; cues to scoot all the way to EOB  Transfers Overall transfer level: Needs assistance Equipment used: Rolling walker (2 wheeled) Transfers: Sit to/from Stand Sit to Stand: Min assist;Mod assist         General transfer comment: at times lifting assist from EOB, better from 3:1 over commode with armrests  Ambulation/Gait Ambulation/Gait assistance: Min assist Gait Distance (Feet): 40 Feet Assistive device: Rolling walker (2 wheeled) Gait Pattern/deviations: Step-through pattern;Step-to pattern;Antalgic;Decreased stride length     General Gait Details: assist for balance, decreased stance time on R; shaky and weak so limited to in room ambulation, already walked in hall with nursing earlier   [employer] Mobility    Modified Rankin (Stroke Patients Only)       Balance Overall balance  assessment: Needs assistance Sitting-balance support: Feet supported Sitting balance-Leahy Scale: Fair     Standing balance support: Bilateral upper extremity supported;During functional activity Standing balance-Leahy Scale: Poor                              Cognition Arousal/Alertness: Awake/alert Behavior During Therapy: WFL for tasks assessed/performed Overall Cognitive Status: Within Functional Limits for tasks assessed                                        Exercises Total Joint Exercises Ankle Circles/Pumps: AROM;10 reps;Supine Short Arc Quad: AROM;10 reps;Right;Supine Heel Slides: AAROM;Right;Supine;10 reps;Seated(x 2 once in supine, once seated EOB) Hip ABduction/ADduction: AAROM;Supine;Right;10 reps Straight Leg Raises: Right;AROM;10 reps;Supine Long Arc Quad: AAROM;Right;Seated;10 reps    General Comments General comments (skin integrity, edema, etc.): BP 152/85 after up to EOB and c/o dizziness/weakness, RN aware      Pertinent Vitals/Pain Pain Score: 4  Pain Location: R knee with exercise Pain Descriptors / Indicators: Sore Pain Intervention(s): Monitored during session;Repositioned;Ice applied    Home Living                      Prior Function            PT Goals (current goals can now be found in the care plan section) Progress towards PT goals: Progressing toward goals    Frequency  Min 5X/week      PT Plan Current plan remains appropriate    Co-evaluation              AM-PAC PT "6 Clicks" Daily Activity  Outcome Measure  Difficulty turning over in bed (including adjusting bedclothes, sheets and blankets)?: A Little Difficulty moving from lying on back to sitting on the side of the bed? : A Little Difficulty sitting down on and standing up from a chair with arms (e.g., wheelchair, bedside commode, etc,.)?: A Lot Help needed moving to and from a bed to chair (including a wheelchair)?: A  Little Help needed walking in hospital room?: A Little Help needed climbing 3-5 steps with a railing? : A Lot 6 Click Score: 16    End of Session Equipment Utilized During Treatment: Gait belt Activity Tolerance: Patient tolerated treatment well Patient left: in bed;with call bell/phone within reach Nurse Communication: Mobility status PT Visit Diagnosis: Unsteadiness on feet (R26.81);Pain;Difficulty in walking, not elsewhere classified (R26.2) Pain - part of body: Knee     Time: 1423-1450 PT Time Calculation (min) (ACUTE ONLY): 27 min  Charges:  $Gait Training: 8-22 mins $Therapeutic Exercise: 8-22 mins                    G CodesMagda Kiel, [place] [phone] 01/19/2018    Reginia Naas 01/19/2018, 3:05 PM

## 2018-01-19 NOTE — Progress Notes (Signed)
PROGRESS NOTE    Cassandra Sims  ZTI:[id number] DOB: [birth year] DOA: 01/13/2018 PCP: Lilian Coma., MD   Brief Narrative:  84 year old female history of recent right knee replacement and discharged to a skilled nursing facility presented back with confusion, weakness, significant hyponatremia, hypokalemia.  Patient also noted to have QTC prolongation.  Blood pressure was borderline and as such Norvasc held.   Assessment & Plan:   Principal Problem:   Hyponatremia Active Problems:   HTN (hypertension)   Arthritis   GERD (gastroesophageal reflux disease)   Hypokalemia   Prolonged QT interval   S/P knee replacement  #1 hyponatremia Likely secondary to hypovolemic hyponatremia in the setting of HCTZ.  Patient noted to have poor oral intake prior to admission with a episode of nausea and emesis.  Urinalysis done was unremarkable.  Urine sodium was less than 10.  Urine creatinine of 36.5.  Patient placed on IV fluids with aggressive hydration with improvement during sodium levels.  Sodium currently at 135 from 133 from 133 from 127 from 128 from 126 from 117 on admission.  HCTZ has been discontinued and will not be resumed on discharge.  IV fluids have been saline locked.  Follow.   2.  Hypokalemia Likely secondary to GI losses and diuretics.  Patient noted to have a bout of nausea and emesis and also on HCTZ prior to admission.  HCTZ has been discontinued.  Repleted potassium.  Follow.   3.  Prolonged QT interval Repeat EKG resolution of QT prolongation.  Keep electrolytes repleted.  Follow.   4.  Status post right knee replacement Patient status post right knee replacement secondary to end-stage OA of the right knee with progressive worsening pain and dysfunction.  Patient underwent right total knee arthroplasty per Dr. Wynelle Link 01/05/2018.  Orthopedics informed of patient's admission.  PT/OT.  Continue aspirin twice daily.  Outpatient follow-up with orthopedics.   5.  Anemia H&H  stable.  Anemia panel consistent with anemia of chronic disease.  Follow.    6.  Glaucoma Continued on home eyedrops.  7.  Hypertension Blood pressure was borderline/soft on admission and as such patient's Norvasc was initially held.  BP improved.  Norvasc resumed as well as Avapro.  Outpatient follow-up.     DVT prophylaxis: Aspirin/Lovenox Code Status: Full Family Communication: Updated patient.  No family at bedside. Disposition Plan: Skilled nursing facility when bed available.     Consultants:   None  Procedures:   Chest x-ray 01/13/2018  Antimicrobials:   None   Subjective: Patient with no shortness of breath.  No chest pain.  No nausea or emesis.  Feeling better.  Patient frustrated as she is been waiting for insurance authorization for about 3 days in order to go to the nursing home.  Objective: Vitals:   01/18/18 1339 01/18/18 2042 01/18/18 2102 01/19/18 0428  BP: 118/65  132/69 (!) 147/76  Pulse: 79  74 74  Resp: 18  16 18   Temp: 99.2 F (37.3 C)  98.6 F (37 C) 98.8 F (37.1 C)  TempSrc: Oral  Oral Oral  SpO2: 100% 98% 98% 98%  Weight:      Height:        Intake/Output Summary (Last 24 hours) at 01/19/2018 1215 Last data filed at 01/19/2018 0911 Gross per 24 hour  Intake 540 ml  Output 3500 ml  Net -2960 ml   Filed Weights   01/13/18 1433  Weight: 54 kg (119 lb)    Examination:  General exam:  NAD. Respiratory system: Lungs clear to auscultation bilaterally.  No wheezes, no crackles, no rhonchi.  Normal respiratory effort.   Cardiovascular system: RRR no murmurs rubs or gallops.  No lower extremity edema.  No JVD. Gastrointestinal system: Abdomen is nontender, nondistended, soft, positive bowel sounds.  No rebound.  No guarding.  Central nervous system: Alert and oriented. No focal neurological deficits. Extremities: Right lower extremity knee with less swelling and some erythema around the right knee with Steri-Strips noted.  Some ecchymosis in  posterior lower thigh region.   Skin: No rashes, lesions or ulcers Psychiatry: Judgement and insight appear normal. Mood & affect appropriate.     Data Reviewed: I have personally reviewed following labs and imaging studies  CBC: Recent Labs  Lab 01/13/18 1500 01/14/18 0329 01/15/18 0312  WBC 10.8* 12.3* 10.1  HGB 10.0* 9.8* 9.3*  HCT 28.1* 27.6* 27.0*  MCV 85.9 86.0 89.4  PLT 315 302 811   Basic Metabolic Panel: Recent Labs  Lab 01/13/18 1500 01/14/18 0329  01/15/18 0312 01/15/18 1455 01/16/18 0404 01/17/18 0401 01/18/18 0410  NA 117* 126*   < > 128* 127* 133* 133* 135  K 2.2* 2.8*   < > 3.9 3.7 3.6 3.4* 4.2  CL 73* 87*   < > 97* 97* 103 101 103  CO2 33* 30   < > 25 25 22 27 26   GLUCOSE 136* 104*   < > 107* 142* 108* 109* 102*  BUN 20 13   < > 10 13 9 9 17   CREATININE 0.69 0.40*   < > 0.36* 0.48 0.30* 0.39* 0.40*  CALCIUM 8.6* 8.2*   < > 7.5* 7.5* 7.7* 8.0* 8.4*  MG 1.9 1.7  --  2.1  --   --   --   --    < > = values in this interval not displayed.   GFR: Estimated Creatinine Clearance: 43.9 mL/min (A) (by C-G formula based on SCr of 0.4 mg/dL (L)). Liver Function Tests: Recent Labs  Lab 01/13/18 1500  AST 29  ALT 44  ALKPHOS 108  BILITOT 1.5*  PROT 5.7*  ALBUMIN 2.9*   No results for input(s): LIPASE, AMYLASE in the last 168 hours. No results for input(s): AMMONIA in the last 168 hours. Coagulation Profile: No results for input(s): INR, PROTIME in the last 168 hours. Cardiac Enzymes: No results for input(s): CKTOTAL, CKMB, CKMBINDEX, TROPONINI in the last 168 hours. BNP (last 3 results) No results for input(s): PROBNP in the last 8760 hours. HbA1C: No results for input(s): HGBA1C in the last 72 hours. CBG: No results for input(s): GLUCAP in the last 168 hours. Lipid Profile: No results for input(s): CHOL, HDL, LDLCALC, TRIG, CHOLHDL, LDLDIRECT in the last 72 hours. Thyroid Function Tests: No results for input(s): TSH, T4TOTAL, FREET4, T3FREE,  THYROIDAB in the last 72 hours. Anemia Panel: No results for input(s): VITAMINB12, FOLATE, FERRITIN, TIBC, IRON, RETICCTPCT in the last 72 hours. Sepsis Labs: Recent Labs  Lab 01/13/18 1510  LATICACIDVEN 1.73    No results found for this or any previous visit (from the past 240 hour(s)).       Radiology Studies: No results found.      Scheduled Meds: . amLODipine  5 mg Oral Daily  . aspirin  325 mg Oral BID  . azelastine  1 spray Each Nare BID  . calcium-vitamin D   Oral Daily  . cholecalciferol  1,000 Units Oral Daily  . docusate sodium  100 mg Oral BID  .  enoxaparin (LOVENOX) injection  40 mg Subcutaneous Q24H  . ice pack  1 each Other Q24H  . irbesartan  300 mg Oral Daily  . latanoprost  1 drop Both Eyes QHS  . meloxicam  7.5 mg Oral Daily  . mometasone-formoterol  2 puff Inhalation BID  . montelukast  10 mg Oral QHS  . multivitamin  1 tablet Oral Daily  . pantoprazole  40 mg Oral Q0600  . psyllium  1 packet Oral Daily  . home med stored in pyxis  1 each Both Eyes QID   Continuous Infusions:    LOS: 6 days    Time spent: 35 minutes    Irine Seal, MD [facility] Pager [phone]  If 7PM-7AM, please contact night-coverage [email or web address] Password [email or web address] 01/19/2018, 12:15 PM

## 2018-01-19 NOTE — Progress Notes (Signed)
LCSW followed up with [facility]. Facility still waiting on auth. Per admissions director, the business office is reaching out to [place] to see if Josem Kaufmann can be expedited.  LCSW will continue to follow.   Carolin Coy [facility] [place]

## 2018-01-19 NOTE — Discharge Summary (Addendum)
Physician Discharge Summary  Cassandra Sims KKX:[id number] DOB: [birth year] DOA: 01/13/2018  PCP: Lilian Coma., MD  Admit date: 01/13/2018 Discharge date: 01/19/2018  Time spent: 60 minutes  Recommendations for Outpatient Follow-up:  1. Follow-up with MD at skilled nursing facility.  Patient will need a basic metabolic profile done to follow-up on electrolytes and renal function. 2. Follow-up with Dr. Wynelle Link, orthopedics as previously scheduled.   Discharge Diagnoses:  Principal Problem:   Hyponatremia Active Problems:   HTN (hypertension)   Arthritis   GERD (gastroesophageal reflux disease)   Hypokalemia   Prolonged QT interval   S/P knee replacement   Discharge Condition: Stable and improved  Diet recommendation: Regular  Filed Weights   01/13/18 1433  Weight: 54 kg (119 lb)    History of present illness:  Per Dr.Mathews  Cassandra Sims is a 84 y.o. female with medical history significant of recent right knee replacement discharged to rehab readmitted with confusion and weakness and abnormal labs.  Patient has not been eating or drinking much at the rehab.  She stated she did not like the food.  She had nausea and vomiting few days ago but had resolved at this time denied any diarrhea abdominal pain or constipation.  No fever chills chest pain shortness of breath or cough reported no urinary complaints.   ED Course: She received 40 potassium p.o. 10 potassium IV and was started on normal saline at 150 cc an hour.    Hospital Course:  1 hyponatremia Likely secondary to hypovolemic hyponatremia in the setting of HCTZ.  Patient noted to have poor oral intake prior to admission with a episode of nausea and emesis.  Urinalysis done was unremarkable.  Urine sodium was less than 10.  Urine creatinine of 36.5.  Patient placed on IV fluids with aggressive hydration with improvement during sodium levels.  Sodium levels improved such that by day of discharge sodium was up to 135  from 117 on admission.   IV fluids were saline locked. HCTZ has been discontinued and will not be resumed on discharge.    2.  Hypokalemia Likely secondary to GI losses and diuretics.  Patient noted to have a bout of nausea and emesis and also on HCTZ prior to admission.  HCTZ has been discontinued.  Repleted potassium.    3.  Prolonged QT interval Patient noted to have a prolonged QT interval on admission.  Patient's electrolytes were repleted adequately.  Repeat EKG showed resolution of prolonged QT interval.  4.  Status post right knee replacement Patient status post right knee replacement secondary to end-stage OA of the right knee with progressive worsening pain and dysfunction.  Patient underwent right total knee arthroplasty per Dr. Wynelle Link 01/05/2018.  Orthopedics informed of patient's admission.  PT/OT.  Continued on aspirin twice daily.  Outpatient follow-up with orthopedics.   5.  Anemia H&H stable.  Anemia panel consistent with anemia of chronic disease.   6.  Glaucoma Placed on home eyedrops.  7.  Hypertension Blood pressure was borderline/soft on admission and as such patient's Norvasc was held.  BP improved and patient was started back on home regimen of Norvasc.  Patient's HCTZ discontinued during the hospitalization due to hyponatremia.  Patient started on Avapro.  Patient will be discharged on her home dose of Norvasc as well as micardis 80 mg daily.  Outpatient follow-up.       Procedures:  CXR 01/13/2018  Consultations:  None  Discharge Exam: Vitals:   01/19/18 0428 01/19/18  1354  BP: (!) 147/76 (!) 149/76  Pulse: 74 74  Resp: 18 18  Temp: 98.8 F (37.1 C) 97.9 F (36.6 C)  SpO2: 98% 98%    General: NAD Cardiovascular: RRR Respiratory: CTAB  Discharge Instructions   Discharge Instructions    Diet general   Complete by:  As directed    Increase activity slowly   Complete by:  As directed      Allergies as of 01/19/2018      Reactions    Penicillins Shortness Of Breath, Swelling   Has patient had a PCN reaction causing immediate rash, facial/tongue/throat swelling, SOB or lightheadedness with hypotension: Yes Has patient had a PCN reaction causing severe rash involving mucus membranes or skin necrosis: No Has patient had a PCN reaction that required hospitalization: No Has patient had a PCN reaction occurring within the last 10 years: No If all of the above answers are "NO", then may proceed with Cephalosporin use.   Adhesive [tape] Itching   BAND-AID TAPE-skin redness/itching   Clindamycin/lincomycin Other (See Comments)   Severe joint pain.   Codeine Nausea And Vomiting   Darvocet [propoxyphene N-acetaminophen] Other (See Comments)   HTN   Dilaudid [hydromorphone Hcl] Nausea And Vomiting   DROP BLOOD PRESSURE HYPOTENSIVE   Doxycycline Other (See Comments)   Pt states she tolerates OKAY   Flagyl [metronidazole] Other (See Comments)   Joint pains   Lactose Intolerance (gi)    Levofloxacin    Joint pain   Macrodantin [nitrofurantoin Macrocrystal]    Morphine And Related    hallucinate   Premarin [conjugated Estrogens] Itching   Tramadol Hcl Other (See Comments)   Causes insomnia   Zantac [ranitidine Hcl]    Itching    Zofran [ondansetron Hcl]    HTN   Nitrofuran Derivatives Rash   Sulfa Antibiotics Rash      Medication List    STOP taking these medications   oxyCODONE 5 MG immediate release tablet Commonly known as:  Oxy IR/ROXICODONE   telmisartan-hydrochlorothiazide 80-25 MG tablet Commonly known as:  MICARDIS HCT     TAKE these medications   acetaminophen 500 MG tablet Commonly known as:  TYLENOL Take 1,000 mg by mouth every 8 (eight) hours as needed for mild pain.   ACIDOPHILUS PO Take 1 capsule by mouth daily.   albuterol 108 (90 Base) MCG/ACT inhaler Commonly known as:  PROVENTIL HFA;VENTOLIN HFA Inhale 2 puffs into the lungs every 4 (four) hours as needed for wheezing. Reported on  10/10/2015   amLODipine 5 MG tablet Commonly known as:  NORVASC Take 5 mg by mouth every evening.   aspirin 325 MG EC tablet Take 1 tablet (325 mg total) by mouth 2 (two) times daily for 17 days. Take one tablet (325 mg) Aspirin two times a day for three weeks following surgery. Then take one baby Aspirin (81 mg) once a day for three weeks. Then discontinue aspirin.   Azelastine HCl 0.15 % Soln Place 1 spray into both nostrils 2 (two) times daily.   bimatoprost 0.01 % Soln Commonly known as:  LUMIGAN Place 1 drop into both eyes nightly.   bisacodyl 10 MG suppository Commonly known as:  DULCOLAX Place 1 suppository (10 mg total) rectally daily as needed for moderate constipation.   budesonide-formoterol 160-4.5 MCG/ACT inhaler Commonly known as:  SYMBICORT Inhale two puffs twice daily to prevent cough or wheeze.   CALTRATE 600+D3 PO Take 1 tablet by mouth daily.   cholecalciferol 1000 units tablet Commonly  known as:  VITAMIN D Take 1,000 Units by mouth daily.   docusate sodium 100 MG capsule Commonly known as:  COLACE Take 1 capsule (100 mg total) by mouth 2 (two) times daily.   FASENRA 30 MG/ML Sosy Generic drug:  Benralizumab Inject 30 mg into the skin every 8 (eight) weeks.   fluticasone 50 MCG/ACT nasal spray Commonly known as:  FLONASE Place 1 spray into both nostrils 2 (two) times daily as needed for allergies.   FUNGI-NAIL EX Apply 1 application topically daily. Applied to affected toe nail daily   LORazepam 0.5 MG tablet Commonly known as:  ATIVAN Take 1 tablet (0.5 mg total) by mouth at bedtime as needed for anxiety or sleep.   LUTEIN 20 Caps Take 20 mg by mouth daily.   meloxicam 7.5 MG tablet Commonly known as:  MOBIC Take 7.5 mg by mouth daily.   methocarbamol 500 MG tablet Commonly known as:  ROBAXIN Take 1 tablet (500 mg total) by mouth every 6 (six) hours as needed for muscle spasms.   metoCLOPramide 5 MG tablet Commonly known as:   REGLAN Take 1-2 tablets (5-10 mg total) by mouth every 8 (eight) hours as needed for nausea (if ondansetron (ZOFRAN) ineffective.).   mometasone 0.1 % ointment Commonly known as:  ELOCON Apply topically 2 (two) times daily. What changed:    how much to take  when to take this  reasons to take this   mometasone-formoterol 100-5 MCG/ACT Aero Commonly known as:  DULERA Take 2 puffs first thing in am and then another 2 puffs about 12 hours later. What changed:    how much to take  how to take this  when to take this  additional instructions  Another medication with the same name was removed. Continue taking this medication, and follow the directions you see here.   montelukast 10 MG tablet Commonly known as:  SINGULAIR Take 10 mg by mouth at bedtime.   multivitamin with minerals Tabs tablet Take 1 tablet by mouth daily. Nature Made Multi Vitamin for Her 50+ W/No Iron   NONFORMULARY OR COMPOUNDED ITEM Place 1 drop into both eyes 4 (four) times daily. Autologous serum eye drops (ASED) made specifically for patient from patient's own blood serum and plasma   ondansetron 4 MG tablet Commonly known as:  ZOFRAN Take 1 tablet (4 mg total) by mouth every 6 (six) hours as needed for nausea.   OXYGEN Inhale into the lungs. 2 liters for shortness of breath or chest pain   pantoprazole 40 MG tablet Commonly known as:  PROTONIX Take 1 tablet (40 mg total) by mouth daily at 6 (six) AM.   polyethylene glycol packet Commonly known as:  MIRALAX / GLYCOLAX Take 17 g by mouth daily as needed for mild constipation.   psyllium 58.6 % powder Commonly known as:  METAMUCIL Take by mouth daily. 14.3 g (1 heaping tablespoon) every morning.   REFRESH TEARS 0.5 % Soln Generic drug:  carboxymethylcellulose Place 1 drop into both eyes 5 (five) times daily as needed (for dry eyes).   sodium phosphate 7-19 GM/118ML Enem Place 133 mLs (1 enema total) rectally once as needed for severe  constipation.   tacrolimus 0.1 % ointment Commonly known as:  PROTOPIC Apply topically 2 (two) times daily. What changed:    how much to take  when to take this  reasons to take this   telmisartan 80 MG tablet Commonly known as:  MICARDIS Take 1 tablet (80 mg total)  by mouth daily.   traMADol 50 MG tablet Commonly known as:  ULTRAM Take 1-2 tablets (50-100 mg total) by mouth every 6 (six) hours as needed for moderate pain (Give second, if unresponsive to oxycodone.).   Turmeric 450 MG Caps Take 450 mg by mouth daily.      Allergies  Allergen Reactions  . Penicillins Shortness Of Breath and Swelling    Has patient had a PCN reaction causing immediate rash, facial/tongue/throat swelling, SOB or lightheadedness with hypotension: Yes Has patient had a PCN reaction causing severe rash involving mucus membranes or skin necrosis: No Has patient had a PCN reaction that required hospitalization: No Has patient had a PCN reaction occurring within the last 10 years: No If all of the above answers are "NO", then may proceed with Cephalosporin use.   . Adhesive [Tape] Itching    BAND-AID TAPE-skin redness/itching  . Clindamycin/Lincomycin Other (See Comments)    Severe joint pain.  . Codeine Nausea And Vomiting  . Darvocet [Propoxyphene N-Acetaminophen] Other (See Comments)    HTN  . Dilaudid [Hydromorphone Hcl] Nausea And Vomiting    DROP BLOOD PRESSURE HYPOTENSIVE  . Doxycycline Other (See Comments)    Pt states she tolerates OKAY  . Flagyl [Metronidazole] Other (See Comments)    Joint pains  . Lactose Intolerance (Gi)   . Levofloxacin     Joint pain  . Macrodantin [Nitrofurantoin Macrocrystal]   . Morphine And Related     hallucinate  . Premarin [Conjugated Estrogens] Itching  . Tramadol Hcl Other (See Comments)    Causes insomnia  . Zantac [Ranitidine Hcl]     Itching   . Zofran [Ondansetron Hcl]     HTN  . Nitrofuran Derivatives Rash  . Sulfa Antibiotics Rash     Contact information for follow-up providers    MD AT SNF Follow up.        Gaynelle Arabian, MD Follow up.   Specialty:  Orthopedic Surgery Why:  F/U AS SCHEDULED. Contact information: [address] [place] [address] [phone]            Contact information for after-discharge care    Destination    [facility] .   Service:  Skilled Nursing Contact information: [address] [place] [phone]                   The results of significant diagnostics from this hospitalization (including imaging, microbiology, ancillary and laboratory) are listed below for reference.    Significant Diagnostic Studies: Dg Chest 2 View  Result Date: 01/13/2018 CLINICAL DATA:  Weakness and confusion following knee surgery 2 days ago. EXAM: CHEST - 2 VIEW COMPARISON:  09/12/2017 FINDINGS: Chronic elevation of the left hemidiaphragm. Chronic volume loss at both lung bases secondary to that. Volume loss at the right base may be slightly above baseline. The upper lungs are clear. No acute bone finding. IMPRESSION: Chronic elevation of the left hemidiaphragm with chronic left lower lung volume loss. Mild volume loss at the right lung base, probably slightly increased above baseline. Electronically Signed   By: Nelson Chimes M.D.   On: 01/13/2018 15:53    Microbiology: No results found for this or any previous visit (from the past 240 hour(s)).   Labs: Basic Metabolic Panel: Recent Labs  Lab 01/13/18 1500 01/14/18 0329  01/15/18 0312 01/15/18 1455 01/16/18 0404 01/17/18 0401 01/18/18 0410  NA 117* 126*   < > 128* 127*  133* 133* 135  K 2.2* 2.8*   < > 3.9 3.7 3.6 3.4* 4.2  CL 73* 87*   < > 97* 97* 103 101 103  CO2 33* 30   < > 25 25 22 27 26   GLUCOSE 136* 104*   < > 107* 142* 108* 109* 102*  BUN 20 13   < > 10 13 9 9 17   CREATININE 0.69 0.40*   < > 0.36* 0.48 0.30* 0.39* 0.40*  CALCIUM 8.6* 8.2*   < >  7.5* 7.5* 7.7* 8.0* 8.4*  MG 1.9 1.7  --  2.1  --   --   --   --    < > = values in this interval not displayed.   Liver Function Tests: Recent Labs  Lab 01/13/18 1500  AST 29  ALT 44  ALKPHOS 108  BILITOT 1.5*  PROT 5.7*  ALBUMIN 2.9*   No results for input(s): LIPASE, AMYLASE in the last 168 hours. No results for input(s): AMMONIA in the last 168 hours. CBC: Recent Labs  Lab 01/13/18 1500 01/14/18 0329 01/15/18 0312  WBC 10.8* 12.3* 10.1  HGB 10.0* 9.8* 9.3*  HCT 28.1* 27.6* 27.0*  MCV 85.9 86.0 89.4  PLT 315 302 350   Cardiac Enzymes: No results for input(s): CKTOTAL, CKMB, CKMBINDEX, TROPONINI in the last 168 hours. BNP: BNP (last 3 results) Recent Labs    10/06/17 1426  BNP 51.2    ProBNP (last 3 results) No results for input(s): PROBNP in the last 8760 hours.  CBG: No results for input(s): GLUCAP in the last 168 hours.     Signed:  Irine Seal MD.  [facility] 01/19/2018, 4:11 PM

## 2018-01-30 ENCOUNTER — Ambulatory Visit (INDEPENDENT_AMBULATORY_CARE_PROVIDER_SITE_OTHER): Payer: Medicare HMO

## 2018-01-30 DIAGNOSIS — J4540 Moderate persistent asthma, uncomplicated: Secondary | ICD-10-CM

## 2018-01-30 DIAGNOSIS — J4550 Severe persistent asthma, uncomplicated: Secondary | ICD-10-CM | POA: Diagnosis not present

## 2018-01-30 MED ORDER — BENRALIZUMAB 30 MG/ML ~~LOC~~ SOSY
30.0000 mg | PREFILLED_SYRINGE | Freq: Once | SUBCUTANEOUS | Status: AC
  Administered 2018-01-30: 30 mg via SUBCUTANEOUS

## 2018-02-04 DIAGNOSIS — R5382 Chronic fatigue, unspecified: Secondary | ICD-10-CM | POA: Insufficient documentation

## 2018-02-18 ENCOUNTER — Ambulatory Visit: Payer: Medicare HMO | Admitting: Allergy and Immunology

## 2018-03-11 ENCOUNTER — Other Ambulatory Visit: Payer: Self-pay | Admitting: Allergy and Immunology

## 2018-03-15 ENCOUNTER — Other Ambulatory Visit: Payer: Self-pay | Admitting: Allergy and Immunology

## 2018-03-16 ENCOUNTER — Telehealth: Payer: Self-pay | Admitting: Allergy and Immunology

## 2018-03-16 NOTE — Telephone Encounter (Signed)
Spoke to patient would like Dulera 200 due to having an application for medication assistance. Patient would like the script sent to her advised would need Dr Neldon Mc to sign and advise on therapy change from [facility] 200 also aware that Dr Neldon Mc will not be in our office until tomorrow 03/17/18. States she will call to make appt as soon as she can due to her being sick states she was in the hospital for over 1 month and is going to diff doctors at this time but her breathing is good with current regimen. Patient verbalized understanding and will make appt. Dr Neldon Mc please advise if we can give Rx for 200 or 100.

## 2018-03-16 NOTE — Telephone Encounter (Signed)
Patient is requesting a handwritten prescription for her Ruthe Mannan, to be mailed to her. Address on file is correct.

## 2018-03-16 NOTE — Telephone Encounter (Signed)
Please provide written prescription for Asmanex (dose as noted previously) and clinic samples.

## 2018-03-17 ENCOUNTER — Other Ambulatory Visit: Payer: Self-pay | Admitting: Allergy and Immunology

## 2018-03-17 MED ORDER — MOMETASONE FURO-FORMOTEROL FUM 100-5 MCG/ACT IN AERO: INHALATION_SPRAY | RESPIRATORY_TRACT | 1 refills | Status: DC

## 2018-03-17 NOTE — Telephone Encounter (Signed)
Prescription signed by Dr Neldon Mc placed in mail per patient request. Patient notified

## 2018-03-17 NOTE — Telephone Encounter (Signed)
Please disregard previous note and provide patient Dulera, not Asmanex, using same prescription as previously prescribed.

## 2018-03-17 NOTE — Telephone Encounter (Signed)
Prescription printed

## 2018-03-19 DIAGNOSIS — D509 Iron deficiency anemia, unspecified: Secondary | ICD-10-CM | POA: Insufficient documentation

## 2018-03-19 NOTE — Progress Notes (Signed)
ekg  01-15-18 epic  cxr 01-13-18 epic

## 2018-03-19 NOTE — Patient Instructions (Addendum)
Cassandra Sims  03/19/2018   Your procedure is scheduled on: 03-30-18  Report to [facility] Main  Entrance   Report to admitting at       200 PM    Call this number if you have problems the morning of surgery [phone]    Remember: Do not eat food:After Midnight.you may have clear liquids until 1020 am then nothing by mouth                  CLEAR LIQUID DIET   Foods Allowed                                                                     Foods Excluded  Coffee and tea, regular and decaf                             liquids that you cannot  Plain Jell-O in any flavor                                             see through such as: Fruit ices (not with fruit pulp)                                     milk, soups, orange juice  Iced Popsicles                                    All solid food Carbonated beverages, regular and diet                                    Cranberry, grape and apple juices Sports drinks like Gatorade Lightly seasoned clear broth or consume(fat free) Sugar, honey syrup  Sample Menu Breakfast                                Lunch                                     Supper Cranberry juice                    Beef broth                            Chicken broth Jell-O                                     Grape juice  Apple juice Coffee or tea                        Jell-O                                      Popsicle                                                Coffee or tea                        Coffee or tea  _____________________________________________________________________    Take these medicines the morning of surgery with A SIP OF WATER: eye drops as usual, inhalers and bring, hydrocodone if needed, nasal spray,            amlodipine                                You may not have any metal on your body including hair pins and              piercings  Do not wear jewelry, make-up, lotions,  powders or perfumes, deodorant             Do not wear nail polish.  Do not shave  48 hours prior to surgery.     Do not bring valuables to the hospital. [place].  Contacts, dentures or bridgework may not be worn into surgery.      Patients discharged the day of surgery will not be allowed to drive home.  Name and phone number of your driver:  Special Instructions: N/A              Please read over the following fact sheets you were given: _____________________________________________________________________             [facility] - Preparing for Surgery Before surgery, you can play an important role.  Because skin is not sterile, your skin needs to be as free of germs as possible.  You can reduce the number of germs on your skin by washing with CHG (chlorahexidine gluconate) soap before surgery.  CHG is an antiseptic cleaner which kills germs and bonds with the skin to continue killing germs even after washing. Please DO NOT use if you have an allergy to CHG or antibacterial soaps.  If your skin becomes reddened/irritated stop using the CHG and inform your nurse when you arrive at Short Stay. Do not shave (including legs and underarms) for at least 48 hours prior to the first CHG shower.  You may shave your face/neck. Please follow these instructions carefully:  1.  Shower with CHG Soap the night before surgery and the  morning of Surgery.  2.  If you choose to wash your hair, wash your hair first as usual with your  normal  shampoo.  3.  After you shampoo, rinse your hair and body thoroughly to remove the  shampoo.  4.  Use CHG as you would any other liquid soap.  You can apply chg directly  to the skin and wash                       Gently with a scrungie or clean washcloth.  5.  Apply the CHG Soap to your body ONLY FROM THE NECK DOWN.   Do not use on face/ open                           Wound or open sores.  Avoid contact with eyes, ears mouth and genitals (private parts).                       Wash face,  Genitals (private parts) with your normal soap.             6.  Wash thoroughly, paying special attention to the area where your surgery  will be performed.  7.  Thoroughly rinse your body with warm water from the neck down.  8.  DO NOT shower/wash with your normal soap after using and rinsing off  the CHG Soap.                9.  Pat yourself dry with a clean towel.            10.  Wear clean pajamas.            11.  Place clean sheets on your bed the night of your first shower and do not  sleep with pets. Day of Surgery : Do not apply any lotions/deodorants the morning of surgery.  Please wear clean clothes to the hospital/surgery center.  FAILURE TO FOLLOW THESE INSTRUCTIONS MAY RESULT IN THE CANCELLATION OF YOUR SURGERY PATIENT SIGNATURE_________________________________  NURSE SIGNATURE__________________________________  ________________________________________________________________________   [name]  An incentive spirometer is a tool that can help keep your lungs clear and active. This tool measures how well you are filling your lungs with each breath. Taking long deep breaths may help reverse or decrease the chance of developing breathing (pulmonary) problems (especially infection) following:  A long period of time when you are unable to move or be active. BEFORE THE PROCEDURE   If the spirometer includes an indicator to show your best effort, your nurse or respiratory therapist will set it to a desired goal.  If possible, sit up straight or lean slightly forward. Try not to slouch.  Hold the incentive spirometer in an upright position. INSTRUCTIONS FOR USE  1. Sit on the edge of your bed if possible, or sit up as far as you can in bed or on a chair. 2. Hold the incentive spirometer in an upright position. 3. Breathe out normally. 4. Place the mouthpiece in your  mouth and seal your lips tightly around it. 5. Breathe in slowly and as deeply as possible, raising the piston or the ball toward the top of the column. 6. Hold your breath for 3-5 seconds or for as long as possible. Allow the piston or ball to fall to the bottom of the column. 7. Remove the mouthpiece from your mouth and breathe out normally. 8. Rest for a few seconds and repeat Steps 1 through 7 at least 10 times every 1-2 hours when you are awake. Take your time and take a few normal breaths between deep breaths. 9. The spirometer may include an indicator to  show your best effort. Use the indicator as a goal to work toward during each repetition. 10. After each set of 10 deep breaths, practice coughing to be sure your lungs are clear. If you have an incision (the cut made at the time of surgery), support your incision when coughing by placing a pillow or rolled up towels firmly against it. Once you are able to get out of bed, walk around indoors and cough well. You may stop using the incentive spirometer when instructed by your caregiver.  RISKS AND COMPLICATIONS  Take your time so you do not get dizzy or light-headed.  If you are in pain, you may need to take or ask for pain medication before doing incentive spirometry. It is harder to take a deep breath if you are having pain. AFTER USE  Rest and breathe slowly and easily.  It can be helpful to keep track of a log of your progress. Your caregiver can provide you with a simple table to help with this. If you are using the spirometer at home, follow these instructions: [place] IF:   You are having difficultly using the spirometer.  You have trouble using the spirometer as often as instructed.  Your pain medication is not giving enough relief while using the spirometer.  You develop fever of 100.5 F (38.1 C) or higher. SEEK IMMEDIATE MEDICAL CARE IF:   You cough up bloody sputum that had not been present before.  You  develop fever of 102 F (38.9 C) or greater.  You develop worsening pain at or near the incision site. MAKE SURE YOU:   Understand these instructions.  Will watch your condition.  Will get help right away if you are not doing well or get worse. Document Released: 11/18/2006 Document Revised: 09/30/2011 Document Reviewed: 01/19/2007 [facility] Patient Information 2014 [place], [place].   ________________________________________________________________________

## 2018-03-20 ENCOUNTER — Encounter (HOSPITAL_COMMUNITY)
Admission: RE | Admit: 2018-03-20 | Discharge: 2018-03-20 | Disposition: A | Discharge status: Home / Self Care | Payer: Medicare HMO | Source: Ambulatory Visit | Attending: Orthopedic Surgery | Admitting: Orthopedic Surgery

## 2018-03-20 ENCOUNTER — Other Ambulatory Visit: Payer: Self-pay

## 2018-03-20 ENCOUNTER — Encounter (HOSPITAL_COMMUNITY): Payer: Self-pay

## 2018-03-20 DIAGNOSIS — Z01812 Encounter for preprocedural laboratory examination: Secondary | ICD-10-CM | POA: Diagnosis not present

## 2018-03-20 LAB — BASIC METABOLIC PANEL
ANION GAP: 9 (ref 5–15)
BUN: 16 mg/dL (ref 8–23)
CALCIUM: 9 mg/dL (ref 8.9–10.3)
CHLORIDE: 106 mmol/L (ref 98–111)
CO2: 25 mmol/L (ref 22–32)
CREATININE: 0.58 mg/dL (ref 0.44–1.00)
GFR calc non Af Amer: >60 mL/min (ref >60)
Glucose, Bld: 94 mg/dL (ref 70–99)
Potassium: 3.7 mmol/L (ref 3.5–5.1)
Sodium: 140 mmol/L (ref 135–145)

## 2018-03-20 LAB — CBC
HEMATOCRIT: 38.1 % (ref 36.0–46.0)
HEMOGLOBIN: 12.1 g/dL (ref 12.0–15.0)
MCH: 28.9 pg (ref 26.0–34.0)
MCHC: 31.8 g/dL (ref 30.0–36.0)
MCV: 90.9 fL (ref 78.0–100.0)
Platelets: 273 10*3/uL (ref 150–400)
RBC: 4.19 MIL/uL (ref 3.87–5.11)
RDW: 14.8 % (ref 11.5–15.5)
WBC: 6.4 10*3/uL (ref 4.0–10.5)

## 2018-03-20 LAB — HEMOGLOBIN A1C
HEMOGLOBIN A1C: 5.6 % (ref 4.8–5.6)
Mean Plasma Glucose: 114.02 mg/dL

## 2018-03-25 ENCOUNTER — Other Ambulatory Visit: Payer: Self-pay | Admitting: *Deleted

## 2018-03-25 MED ORDER — AZELASTINE HCL 0.15 % NA SOLN: 1.0000 | Freq: Two times a day (BID) | NASAL | 0 refills | Status: DC

## 2018-03-27 ENCOUNTER — Ambulatory Visit (INDEPENDENT_AMBULATORY_CARE_PROVIDER_SITE_OTHER): Payer: Medicare HMO

## 2018-03-27 DIAGNOSIS — J4540 Moderate persistent asthma, uncomplicated: Secondary | ICD-10-CM

## 2018-03-27 MED ORDER — BENRALIZUMAB 30 MG/ML ~~LOC~~ SOSY
30.0000 mg | PREFILLED_SYRINGE | Freq: Once | SUBCUTANEOUS | Status: AC
  Administered 2018-03-27: 30 mg via SUBCUTANEOUS

## 2018-03-30 ENCOUNTER — Ambulatory Visit (HOSPITAL_COMMUNITY): Payer: Medicare HMO | Admitting: Certified Registered Nurse Anesthetist

## 2018-03-30 ENCOUNTER — Other Ambulatory Visit: Payer: Self-pay

## 2018-03-30 ENCOUNTER — Encounter (HOSPITAL_COMMUNITY)
Admission: RE | Disposition: A | Discharge status: Home / Self Care | Payer: Self-pay | Source: Ambulatory Visit | Attending: Orthopedic Surgery

## 2018-03-30 ENCOUNTER — Encounter (HOSPITAL_COMMUNITY): Payer: Self-pay | Admitting: Certified Registered Nurse Anesthetist

## 2018-03-30 ENCOUNTER — Observation Stay (HOSPITAL_COMMUNITY)
Admission: RE | Admit: 2018-03-30 | Discharge: 2018-03-31 | Disposition: A | Discharge status: Home / Self Care | Payer: Medicare HMO | Source: Ambulatory Visit | Attending: Orthopedic Surgery | Admitting: Orthopedic Surgery

## 2018-03-30 DIAGNOSIS — Z882 Allergy status to sulfonamides status: Secondary | ICD-10-CM | POA: Diagnosis not present

## 2018-03-30 DIAGNOSIS — G43909 Migraine, unspecified, not intractable, without status migrainosus: Secondary | ICD-10-CM | POA: Diagnosis not present

## 2018-03-30 DIAGNOSIS — H409 Unspecified glaucoma: Secondary | ICD-10-CM | POA: Insufficient documentation

## 2018-03-30 DIAGNOSIS — R7303 Prediabetes: Secondary | ICD-10-CM | POA: Insufficient documentation

## 2018-03-30 DIAGNOSIS — J45909 Unspecified asthma, uncomplicated: Secondary | ICD-10-CM | POA: Insufficient documentation

## 2018-03-30 DIAGNOSIS — Z91048 Other nonmedicinal substance allergy status: Secondary | ICD-10-CM | POA: Insufficient documentation

## 2018-03-30 DIAGNOSIS — I4581 Long QT syndrome: Secondary | ICD-10-CM | POA: Diagnosis not present

## 2018-03-30 DIAGNOSIS — Z96651 Presence of right artificial knee joint: Secondary | ICD-10-CM | POA: Insufficient documentation

## 2018-03-30 DIAGNOSIS — M1990 Unspecified osteoarthritis, unspecified site: Secondary | ICD-10-CM | POA: Insufficient documentation

## 2018-03-30 DIAGNOSIS — M25861 Other specified joint disorders, right knee: Secondary | ICD-10-CM | POA: Diagnosis not present

## 2018-03-30 DIAGNOSIS — K219 Gastro-esophageal reflux disease without esophagitis: Secondary | ICD-10-CM | POA: Diagnosis not present

## 2018-03-30 DIAGNOSIS — I739 Peripheral vascular disease, unspecified: Secondary | ICD-10-CM | POA: Insufficient documentation

## 2018-03-30 DIAGNOSIS — Z881 Allergy status to other antibiotic agents status: Secondary | ICD-10-CM | POA: Diagnosis not present

## 2018-03-30 DIAGNOSIS — M25869 Other specified joint disorders, unspecified knee: Secondary | ICD-10-CM

## 2018-03-30 DIAGNOSIS — H9190 Unspecified hearing loss, unspecified ear: Secondary | ICD-10-CM | POA: Diagnosis not present

## 2018-03-30 DIAGNOSIS — Z880 Allergy status to penicillin: Secondary | ICD-10-CM | POA: Diagnosis not present

## 2018-03-30 DIAGNOSIS — Z96659 Presence of unspecified artificial knee joint: Secondary | ICD-10-CM

## 2018-03-30 DIAGNOSIS — Z23 Encounter for immunization: Secondary | ICD-10-CM | POA: Insufficient documentation

## 2018-03-30 DIAGNOSIS — Z885 Allergy status to narcotic agent status: Secondary | ICD-10-CM | POA: Insufficient documentation

## 2018-03-30 DIAGNOSIS — I10 Essential (primary) hypertension: Secondary | ICD-10-CM | POA: Insufficient documentation

## 2018-03-30 DIAGNOSIS — F419 Anxiety disorder, unspecified: Secondary | ICD-10-CM | POA: Diagnosis not present

## 2018-03-30 HISTORY — PX: KNEE ARTHROSCOPY: SHX127

## 2018-03-30 SURGERY — ARTHROSCOPY, KNEE
Anesthesia: General | Laterality: Right

## 2018-03-30 MED ORDER — PROPOFOL 10 MG/ML IV BOLUS
INTRAVENOUS | Status: DC | PRN
  Administered 2018-03-30 (×2): 100 mg via INTRAVENOUS

## 2018-03-30 MED ORDER — PHENYLEPHRINE 40 MCG/ML (10ML) SYRINGE FOR IV PUSH (FOR BLOOD PRESSURE SUPPORT)
PREFILLED_SYRINGE | INTRAVENOUS | Status: DC | PRN
  Administered 2018-03-30 (×3): 80 ug via INTRAVENOUS

## 2018-03-30 MED ORDER — VANCOMYCIN HCL IN DEXTROSE 1-5 GM/200ML-% IV SOLN
INTRAVENOUS | Status: AC
  Administered 2018-03-30: 1000 mg via INTRAVENOUS
  Filled 2018-03-30: qty 200

## 2018-03-30 MED ORDER — METHOCARBAMOL 500 MG IVPB - SIMPLE MED
INTRAVENOUS | Status: AC
  Administered 2018-03-30: 500 mg via INTRAVENOUS
  Filled 2018-03-30: qty 50

## 2018-03-30 MED ORDER — LIDOCAINE 2% (20 MG/ML) 5 ML SYRINGE
INTRAMUSCULAR | Status: DC | PRN
  Administered 2018-03-30: 60 mg via INTRAVENOUS

## 2018-03-30 MED ORDER — EPHEDRINE SULFATE-NACL 50-0.9 MG/10ML-% IV SOSY
PREFILLED_SYRINGE | INTRAVENOUS | Status: DC | PRN
  Administered 2018-03-30: 5 mg via INTRAVENOUS

## 2018-03-30 MED ORDER — LACTATED RINGERS IV SOLN
INTRAVENOUS | Status: DC
  Administered 2018-03-30: 13:00:00 via INTRAVENOUS

## 2018-03-30 MED ORDER — DOCUSATE SODIUM 100 MG PO CAPS
100.0000 mg | ORAL_CAPSULE | Freq: Two times a day (BID) | ORAL | Status: DC
  Administered 2018-03-30 – 2018-03-31 (×2): 100 mg via ORAL
  Filled 2018-03-30 (×2): qty 1

## 2018-03-30 MED ORDER — TRAMADOL HCL 50 MG PO TABS
50.0000 mg | ORAL_TABLET | Freq: Four times a day (QID) | ORAL | Status: DC | PRN
  Administered 2018-03-30: 50 mg via ORAL
  Filled 2018-03-30 (×2): qty 1

## 2018-03-30 MED ORDER — WATER FOR IRRIGATION, STERILE IR SOLN
Status: DC | PRN
  Administered 2018-03-30: 500 mL

## 2018-03-30 MED ORDER — IRBESARTAN 150 MG PO TABS
300.0000 mg | ORAL_TABLET | Freq: Every day | ORAL | Status: DC
  Administered 2018-03-31: 300 mg via ORAL
  Filled 2018-03-30: qty 2

## 2018-03-30 MED ORDER — FENTANYL CITRATE (PF) 100 MCG/2ML IJ SOLN
INTRAMUSCULAR | Status: AC
  Administered 2018-03-30: 25 ug via INTRAVENOUS
  Filled 2018-03-30: qty 2

## 2018-03-30 MED ORDER — SODIUM CHLORIDE 0.9 % IV SOLN
INTRAVENOUS | Status: DC
  Administered 2018-03-30: 22:00:00 via INTRAVENOUS

## 2018-03-30 MED ORDER — METHOCARBAMOL 500 MG IVPB - SIMPLE MED
500.0000 mg | Freq: Four times a day (QID) | INTRAVENOUS | Status: DC | PRN
  Administered 2018-03-30: 500 mg via INTRAVENOUS
  Filled 2018-03-30: qty 50

## 2018-03-30 MED ORDER — FENTANYL CITRATE (PF) 100 MCG/2ML IJ SOLN
INTRAMUSCULAR | Status: AC
  Filled 2018-03-30: qty 2

## 2018-03-30 MED ORDER — BUPIVACAINE HCL (PF) 0.25 % IJ SOLN
INTRAMUSCULAR | Status: DC | PRN
  Administered 2018-03-30: 20 mL

## 2018-03-30 MED ORDER — LACTATED RINGERS IR SOLN
Status: DC | PRN
  Administered 2018-03-30: 6000 mL

## 2018-03-30 MED ORDER — INFLUENZA VAC SPLIT HIGH-DOSE 0.5 ML IM SUSY
0.5000 mL | PREFILLED_SYRINGE | INTRAMUSCULAR | Status: AC
  Administered 2018-03-31: 0.5 mL via INTRAMUSCULAR
  Filled 2018-03-30: qty 0.5

## 2018-03-30 MED ORDER — DEXAMETHASONE SODIUM PHOSPHATE 10 MG/ML IJ SOLN
INTRAMUSCULAR | Status: DC | PRN
  Administered 2018-03-30: 5 mg via INTRAVENOUS

## 2018-03-30 MED ORDER — AMLODIPINE BESYLATE 5 MG PO TABS
5.0000 mg | ORAL_TABLET | Freq: Every day | ORAL | Status: DC
  Administered 2018-03-31: 5 mg via ORAL
  Filled 2018-03-30: qty 1

## 2018-03-30 MED ORDER — MONTELUKAST SODIUM 10 MG PO TABS
10.0000 mg | ORAL_TABLET | Freq: Every day | ORAL | Status: DC
  Administered 2018-03-30: 10 mg via ORAL
  Filled 2018-03-30: qty 1

## 2018-03-30 MED ORDER — POVIDONE-IODINE 10 % EX SWAB: 2.0000 "application " | Freq: Once | CUTANEOUS | Status: DC

## 2018-03-30 MED ORDER — FENTANYL CITRATE (PF) 100 MCG/2ML IJ SOLN
25.0000 ug | INTRAMUSCULAR | Status: DC | PRN
  Administered 2018-03-30: 25 ug via INTRAVENOUS

## 2018-03-30 MED ORDER — PHENYLEPHRINE 40 MCG/ML (10ML) SYRINGE FOR IV PUSH (FOR BLOOD PRESSURE SUPPORT)
PREFILLED_SYRINGE | INTRAVENOUS | Status: AC
  Filled 2018-03-30: qty 10

## 2018-03-30 MED ORDER — KETOROLAC TROMETHAMINE 30 MG/ML IJ SOLN: 15.0000 mg | Freq: Once | INTRAMUSCULAR | Status: DC | PRN

## 2018-03-30 MED ORDER — VANCOMYCIN HCL IN DEXTROSE 1-5 GM/200ML-% IV SOLN
1000.0000 mg | INTRAVENOUS | Status: AC
  Administered 2018-03-30: 1000 mg via INTRAVENOUS

## 2018-03-30 MED ORDER — FLUTICASONE PROPIONATE 50 MCG/ACT NA SUSP
1.0000 | Freq: Two times a day (BID) | NASAL | Status: DC | PRN
  Filled 2018-03-30: qty 16

## 2018-03-30 MED ORDER — FENTANYL CITRATE (PF) 100 MCG/2ML IJ SOLN
INTRAMUSCULAR | Status: DC | PRN
  Administered 2018-03-30 (×2): 25 ug via INTRAVENOUS
  Administered 2018-03-30: 50 ug via INTRAVENOUS
  Administered 2018-03-30: 25 ug via INTRAVENOUS

## 2018-03-30 MED ORDER — METHOCARBAMOL 500 MG PO TABS
500.0000 mg | ORAL_TABLET | Freq: Four times a day (QID) | ORAL | Status: DC | PRN
  Administered 2018-03-31 (×2): 500 mg via ORAL
  Filled 2018-03-30 (×2): qty 1

## 2018-03-30 MED ORDER — BUPIVACAINE-EPINEPHRINE (PF) 0.5% -1:200000 IJ SOLN
INTRAMUSCULAR | Status: AC
  Filled 2018-03-30: qty 30

## 2018-03-30 MED ORDER — CHLORHEXIDINE GLUCONATE 4 % EX LIQD: 60.0000 mL | Freq: Once | CUTANEOUS | Status: DC

## 2018-03-30 MED ORDER — BUPIVACAINE HCL (PF) 0.25 % IJ SOLN
INTRAMUSCULAR | Status: AC
  Filled 2018-03-30: qty 30

## 2018-03-30 MED ORDER — METOCLOPRAMIDE HCL 5 MG PO TABS: 5.0000 mg | ORAL_TABLET | Freq: Three times a day (TID) | ORAL | Status: DC | PRN

## 2018-03-30 MED ORDER — ESCITALOPRAM OXALATE 10 MG PO TABS
10.0000 mg | ORAL_TABLET | Freq: Every day | ORAL | Status: DC
  Administered 2018-03-30: 10 mg via ORAL
  Filled 2018-03-30: qty 1

## 2018-03-30 MED ORDER — METOCLOPRAMIDE HCL 5 MG/ML IJ SOLN: 5.0000 mg | Freq: Three times a day (TID) | INTRAMUSCULAR | Status: DC | PRN

## 2018-03-30 MED ORDER — AZELASTINE HCL 0.1 % NA SOLN
1.0000 | Freq: Two times a day (BID) | NASAL | Status: DC
  Administered 2018-03-30 – 2018-03-31 (×2): 1 via NASAL
  Filled 2018-03-30: qty 30

## 2018-03-30 SURGICAL SUPPLY — 22 items
BANDAGE ACE 6X5 VEL STRL LF (GAUZE/BANDAGES/DRESSINGS) ×3 IMPLANT
BLADE 4.2CUDA (BLADE) ×3 IMPLANT
DRAPE U-SHAPE 47X51 STRL (DRAPES) ×3 IMPLANT
DRSG EMULSION OIL 3X3 NADH (GAUZE/BANDAGES/DRESSINGS) ×3 IMPLANT
DRSG PAD ABDOMINAL 8X10 ST (GAUZE/BANDAGES/DRESSINGS) ×3 IMPLANT
DURAPREP 26ML APPLICATOR (WOUND CARE) ×3 IMPLANT
GAUZE SPONGE 4X4 12PLY STRL (GAUZE/BANDAGES/DRESSINGS) ×3 IMPLANT
GLOVE BIOGEL PI IND STRL 8 (GLOVE) ×1 IMPLANT
GLOVE BIOGEL PI INDICATOR 8 (GLOVE) ×2
GOWN STRL REUS W/TWL LRG LVL3 (GOWN DISPOSABLE) ×6 IMPLANT
KIT BASIN OR (CUSTOM PROCEDURE TRAY) ×3 IMPLANT
MANIFOLD NEPTUNE II (INSTRUMENTS) ×3 IMPLANT
MARKER SKIN DUAL TIP RULER LAB (MISCELLANEOUS) ×3 IMPLANT
PACK ARTHROSCOPY WL (CUSTOM PROCEDURE TRAY) ×3 IMPLANT
PACK ICE MAXI GEL EZY WRAP (MISCELLANEOUS) ×9 IMPLANT
PAD ABD 8X10 STRL (GAUZE/BANDAGES/DRESSINGS) ×3 IMPLANT
POSITIONER SURGICAL ARM (MISCELLANEOUS) ×3 IMPLANT
PROBE BIPOLAR ATHRO 135MM 90D (MISCELLANEOUS) ×3 IMPLANT
SUT ETHILON 4 0 PS 2 18 (SUTURE) ×3 IMPLANT
TOWEL OR 17X26 10 PK STRL BLUE (TOWEL DISPOSABLE) ×3 IMPLANT
TUBING ARTHRO INFLOW-ONLY STRL (TUBING) ×3 IMPLANT
WRAP KNEE MAXI GEL POST OP (GAUZE/BANDAGES/DRESSINGS) ×3 IMPLANT

## 2018-03-30 NOTE — Brief Op Note (Signed)
03/30/2018  4:09 PM  PATIENT:  Cassandra Sims  84 y.o. female  PRE-OPERATIVE DIAGNOSIS:  right knee patellar clunk syndrome  POST-OPERATIVE DIAGNOSIS:  right knee patellar clunk syndrome  PROCEDURE:  Procedure(s) with comments: right knee scope; synovectomy (Right) - 71min  SURGEON:  Surgeon(s) and Role:    Gaynelle Arabian, MD - Primary  PHYSICIAN ASSISTANT:   ASSISTANTS: none   ANESTHESIA:   general  EBL:  25 mL   BLOOD ADMINISTERED:none  DRAINS: none   LOCAL MEDICATIONS USED:  MARCAINE    COUNTS:  YES  TOURNIQUET:  * No tourniquets in log *  DICTATION: .Other Dictation: Dictation Number [id number]  PLAN OF CARE: Admit for overnight observation  PATIENT DISPOSITION:  PACU - hemodynamically stable.

## 2018-03-30 NOTE — Op Note (Signed)
NAME: Cassandra Sims, RAMAKRISHNAN MEDICAL RECORD [id number] ACCOUNT [id number] DATE OF BIRTH:[birth year] FACILITY: [facility] LOCATION: [facility] PHYSICIAN:Shayli Altemose Zella Ball, MD  OPERATIVE REPORT  DATE OF PROCEDURE:  03/30/2018  PREOPERATIVE DIAGNOSIS:  Right knee patellar clunk syndrome.  POSTOPERATIVE DIAGNOSIS:  Right knee patellar clunk syndrome.  PROCEDURE:  Right knee arthroscopy with synovectomy.  SURGEON:  Gaynelle Arabian, MD  ASSISTANT:  None.  ANESTHESIA:  General.  ESTIMATED BLOOD LOSS:  Minimal.  DRAINS:  None.  COMPLICATIONS:  None.  CONDITION:  Stable to recovery.  BRIEF CLINICAL NOTE:  The patient is an 83 year old female who underwent a right total knee arthroplasty several months ago.  She has gone on to develop painful popping in the knee consistent with patellar clunk syndrome.  She presents now for  arthroscopic synovectomy.  DESCRIPTION OF PROCEDURE IN DETAIL:  After successful administration of general anesthetic, the patient's right lower extremity was prepped and draped in the usual sterile fashion.  Standard superomedial, inferolateral incisions were made.  Inflow  cannula passed superomedial and camera passed inferolateral.  Arthroscopic visualization proceeds.  There was a large mound of hypertrophic synovial tissue at the junction of the quadriceps tendon and patellar component, which is essentially obliterating  the suprapatellar pouch.  A superolateral portal was created using a combination of the shaver and the edge cautery device.  The tissue was debrided and cauterized.  This effectively recreated a suprapatellar pouch.  The medial and lateral gutters were  also debrided of any abnormal tissue.  The joint was again inspected.  Everything else looked normal.  Arthroscopic equipment was removed from the lateral portals which were closed with interrupted 4-0 nylon.  Twenty milliliters of 0.25% Marcaine were  injected through the inflow cannula then that is removed  and that portal closed with nylon.  The incision is clean, dry and a bulky sterile dressing applied.  She was then awakened and transported to recovery in stable condition.  TN/NUANCE  D:03/30/2018 T:03/30/2018 JOB:[id number]

## 2018-03-30 NOTE — H&P (Signed)
CC- Cassandra Sims is a 84 y.o. female who presents with right knee pain.  HPI- . Knee Pain: Patient presents with knee pain involving the  right knee. Onset of the symptoms was several weeks ago. Inciting event: She had a right total knee arthroplasty on 01/05/18 and has painful popping in the knee consistent with patellar clunk syndrome. She presents today for arthroscopic synovectomy.. Current symptoms include popping sensation and pain when extending her knee.. Pain is aggravated by going up and down stairs and rising after sitting.  Patient has had prior knee problems. Evaluation to date: plain films: normal.   Past Medical History:  Diagnosis Date  . Anxiety   . Arthritis   . Asthma   . Complication of anesthesia    hard to wake up, 2009  sit up in bed to fast and BP dropped low code blue called per pt.  . Family history of adverse reaction to anesthesia    sister has [place]  . GERD (gastroesophageal reflux disease)   . Glaucoma   . Headache    hx of migrains  . HOH (hard of hearing)   . Hypertension   . Hypoglycemia   . Multiple allergies   . Peripheral vascular disease (HCC)    varicose veins , Laser vein surgery  . PONV (postoperative nausea and vomiting)   . Pre-diabetes   . Prolonged QT interval 01/13/2018  . Rhinitis   . S/P knee replacement 01/13/2018  . Wears glasses     Past Surgical History:  Procedure Laterality Date  . ABDOMINAL HYSTERECTOMY    . APPENDECTOMY    . arthroscopy right knee    . BREAST SURGERY     biopsy left   . COLECTOMY  2008   obstruction  . DIAGNOSTIC LAPAROSCOPY     exp  . DILATION AND CURETTAGE OF UTERUS    . EYE SURGERY     cataract   bil and retina surgery right eye, glaucoma surgery, right eye  . FUNCTIONAL ENDOSCOPIC SINUS SURGERY  2009   bilat ethm,frontal,max  . JOINT REPLACEMENT     right total knee arthroplasty 01-05-18 Cassandra Sims  . KNEE ARTHROSCOPY     right with synovectomy Dr. Wynelle Link 03-30-18   . NASAL SINUS SURGERY  Bilateral 10/26/2012   Procedure: BILATERAL ENDOSCOPIC REVISION ETHMOID MAXILLARY AND FRONTAL SINUS SURGERY  ;  Surgeon: Izora Gala, MD;  Location: [facility];  Service: ENT;  Laterality: Bilateral;  . OVARIAN CYST SURGERY    . TOTAL KNEE ARTHROPLASTY Right 01/05/2018   Procedure: RIGHT TOTAL KNEE ARTHROPLASTY;  Surgeon: Gaynelle Arabian, MD;  Location: [facility];  Service: Orthopedics;  Laterality: Right;  . TUBAL LIGATION    . URETER SURGERY     cut accidentally during exp lap-ovarien cyst  . VEIN LIGATION      Prior to Admission medications   Medication Sig Start Date End Date Taking? Authorizing Provider  acetaminophen (TYLENOL) 500 MG tablet Take 1,000 mg by mouth every 8 (eight) hours as needed for mild pain.   Yes [provider]  albuterol (PROVENTIL HFA;VENTOLIN HFA) 108 (90 BASE) MCG/ACT inhaler Inhale 2 puffs into the lungs every 4 (four) hours as needed for wheezing. Reported on 10/10/2015   Yes [provider]  amLODipine (NORVASC) 5 MG tablet Take 5 mg by mouth daily.  01/29/17  Yes [provider]  Azelastine HCl 0.15 % SOLN Place 1 spray into both nostrils 2 (two) times daily. 03/25/18  Yes Kozlow,  Cassandra Poag, MD  Benralizumab [facility]) 30 MG/ML SOSY Inject 30 mg into the skin every 8 (eight) weeks.   Yes [provider]  bimatoprost (LUMIGAN) 0.01 % SOLN Place 1 drop into both eyes at bedtime.  11/17/17 11/17/18 Yes [provider]  Calcium Carb-Cholecalciferol (CALTRATE 600+D3 PO) Take 1 tablet by mouth daily.   Yes [provider]  carboxymethylcellulose (REFRESH TEARS) 0.5 % SOLN Place 1 drop into both eyes 5 (five) times daily as needed (for dry eyes).   Yes [provider]  cholecalciferol (VITAMIN D) 1000 UNITS tablet Take 1,000 Units by mouth daily with lunch.    Yes [provider]  escitalopram (LEXAPRO) 10 MG tablet Take 10 mg by mouth at bedtime. 03/05/18  Yes [provider]  fluticasone  (FLONASE) 50 MCG/ACT nasal spray Place 1 spray into both nostrils 2 (two) times daily as needed for allergies.    Yes [provider]  HYDROcodone-acetaminophen (NORCO/VICODIN) 5-325 MG tablet Take 0.5-1 tablets by mouth every 8 (eight) hours as needed. For severe knee pain 03/05/18  Yes [provider]  Lactobacillus (ACIDOPHILUS PO) Take 1 capsule by mouth 3 (three) times a week.    Yes [provider]  Melatonin 3 MG TABS Take 3 mg by mouth at bedtime.   Yes [provider]  Misc Natural Products (LUTEIN 20) CAPS Take 20 mg by mouth daily with lunch.    Yes [provider]  mometasone-formoterol (DULERA) 100-5 MCG/ACT AERO Take 2 puffs first thing in am and then another 2 puffs about 12 hours later. Patient taking differently: Inhale 2 puffs into the lungs 2 (two) times daily.  03/17/18  Yes Kozlow, Cassandra Poag, MD  montelukast (SINGULAIR) 10 MG tablet Take 10 mg by mouth at bedtime.   Yes [provider]  Multiple Vitamin (MULTIVITAMIN WITH MINERALS) TABS tablet Take 1 tablet by mouth daily. Nature Made Multi Vitamin for Her 50+ W/No Iron   Yes [provider]  NONFORMULARY OR COMPOUNDED ITEM Place 1 drop into both eyes 4 (four) times daily. Autologous serum eye drops (ASED) made specifically for patient from patient's own blood serum and plasma   Yes [provider]  psyllium (METAMUCIL) 58.6 % powder Take 1 packet by mouth daily. 14.3 g (1 heaping tablespoon) every morning.   Yes [provider]  telmisartan (MICARDIS) 80 MG tablet Take 1 tablet (80 mg total) by mouth daily. 01/16/18  Yes Cassandra Filler, MD  Turmeric 450 MG CAPS Take 450 mg by mouth daily with lunch.    Yes [provider]  bisacodyl (DULCOLAX) 10 MG suppository Place 1 suppository (10 mg total) rectally daily as needed for moderate constipation. Patient not taking: Reported on 01/13/2018 01/07/18   Edmisten, Ok Anis, PA  budesonide-formoterol  (SYMBICORT) 160-4.5 MCG/ACT inhaler Inhale two puffs twice daily to prevent cough or wheeze. Patient not taking: Reported on 03/19/2018 11/27/17   Jiles Prows, MD  docusate sodium (COLACE) 100 MG capsule Take 1 capsule (100 mg total) by mouth 2 (two) times daily. Patient not taking: Reported on 03/19/2018 01/07/18   Edmisten, Kristie L, PA  LORazepam (ATIVAN) 0.5 MG tablet Take 1 tablet (0.5 mg total) by mouth at bedtime as needed for anxiety or sleep. Patient not taking: Reported on 03/19/2018 01/19/18   Cassandra Filler, MD  methocarbamol (ROBAXIN) 500 MG tablet Take 1 tablet (500 mg total) by mouth every 6 (six) hours as needed for muscle spasms. Patient not taking:  Reported on 03/19/2018 01/07/18   Edmisten, Ok Anis, PA  metoCLOPramide (REGLAN) 5 MG tablet Take 1-2 tablets (5-10 mg total) by mouth every 8 (eight) hours as needed for nausea (if ondansetron (ZOFRAN) ineffective.). Patient not taking: Reported on 03/19/2018 01/07/18   Edmisten, Kristie L, PA  mometasone (ELOCON) 0.1 % ointment Apply topically 2 (two) times daily. Patient taking differently: Apply 1 application topically 2 (two) times daily as needed (for itching.).  01/16/17   Kozlow, Cassandra Poag, MD  ondansetron (ZOFRAN) 4 MG tablet Take 1 tablet (4 mg total) by mouth every 6 (six) hours as needed for nausea. Patient not taking: Reported on 03/19/2018 01/07/18   Edmisten, Drue Dun L, PA  pantoprazole (PROTONIX) 40 MG tablet Take 1 tablet (40 mg total) by mouth daily at 6 (six) AM. Patient not taking: Reported on 03/19/2018 01/17/18   Cassandra Filler, MD  polyethylene glycol [facility] / Floria Raveling) packet Take 17 g by mouth daily as needed for mild constipation. Patient not taking: Reported on 03/19/2018 01/07/18   Edmisten, Drue Dun L, PA  sodium phosphate (FLEET) 7-19 GM/118ML ENEM Place 133 mLs (1 enema total) rectally once as needed for severe constipation. Patient not taking: Reported on 01/13/2018 01/07/18   Theresa Duty L, PA  tacrolimus  (PROTOPIC) 0.1 % ointment Apply topically 2 (two) times daily. Patient taking differently: Apply 1 application topically 2 (two) times daily as needed (for itching.).  01/16/17   Kozlow, Cassandra Poag, MD  traMADol (ULTRAM) 50 MG tablet Take 1-2 tablets (50-100 mg total) by mouth every 6 (six) hours as needed for moderate pain (Give second, if unresponsive to oxycodone.). Patient not taking: Reported on 03/19/2018 01/16/18   Cassandra Filler, MD   KNEE EXAM antalgic gait, soft tissue tenderness over anterior knee, no warmth or effusion, no laxity, range 0-125  Physical Examination: General appearance - alert, well appearing, and in no distress Mental status - alert, oriented to person, place, and time Chest - clear to auscultation, no wheezes, rales or rhonchi, symmetric air entry Heart - normal rate, regular rhythm, normal S1, S2, no murmurs, rubs, clicks or gallops Abdomen - soft, nontender, nondistended, no masses or organomegaly Neurological - alert, oriented, normal speech, no focal findings or movement disorder noted   Asessment/Plan--- Right knee patellar clunk synfrome- - Plan right knee arthroscopy with synovectomy. Procedure risks and potential comps discussed with patient who elects to proceed. Goals are decreased pain and increased function with a high likelihood of achieving both

## 2018-03-30 NOTE — Progress Notes (Signed)
Dr. Kalman Shan informed that pt had clear ensure at 0830 and that it stated in ingredients that it contained "milk ingredients." Dr. Kalman Shan confirmed this was okay and is still considered a clear.

## 2018-03-30 NOTE — Anesthesia Preprocedure Evaluation (Signed)
Anesthesia Evaluation  Patient identified by MRN, date of birth, ID band Patient awake    Reviewed: Allergy & Precautions, NPO status , Patient's Chart, lab work & pertinent test results  History of Anesthesia Complications (+) PONV  Airway Mallampati: II  TM Distance: >3 FB Neck ROM: Full    Dental no notable dental hx.    Pulmonary asthma ,    Pulmonary exam normal breath sounds clear to auscultation       Cardiovascular hypertension, Normal cardiovascular exam Rhythm:Regular Rate:Normal     Neuro/Psych Anxiety negative neurological ROS     GI/Hepatic negative GI ROS, Neg liver ROS,   Endo/Other  negative endocrine ROS  Renal/GU negative Renal ROS  negative genitourinary   Musculoskeletal negative musculoskeletal ROS (+)   Abdominal   Peds negative pediatric ROS (+)  Hematology negative hematology ROS (+)   Anesthesia Other Findings   Reproductive/Obstetrics negative OB ROS                             Anesthesia Physical Anesthesia Plan  ASA: II  Anesthesia Plan: General   Post-op Pain Management:    Induction: Intravenous  PONV Risk Score and Plan: 3 and Ondansetron, Dexamethasone and Treatment may vary due to age or medical condition  Airway Management Planned: LMA  Additional Equipment:   Intra-op Plan:   Post-operative Plan: Extubation in OR  Informed Consent: I have reviewed the patients History and Physical, chart, labs and discussed the procedure including the risks, benefits and alternatives for the proposed anesthesia with the patient or authorized representative who has indicated his/her understanding and acceptance.   Dental advisory given  Plan Discussed with: CRNA and Surgeon  Anesthesia Plan Comments:         Anesthesia Quick Evaluation

## 2018-03-30 NOTE — Anesthesia Postprocedure Evaluation (Signed)
Anesthesia Post Note  Patient: Cassandra Sims  Procedure(s) Performed: right knee scope; synovectomy (Right )     Patient location during evaluation: PACU Anesthesia Type: General Level of consciousness: awake and alert Pain management: pain level controlled Vital Signs Assessment: post-procedure vital signs reviewed and stable Respiratory status: spontaneous breathing, nonlabored ventilation, respiratory function stable and patient connected to nasal cannula oxygen Cardiovascular status: blood pressure returned to baseline and stable Postop Assessment: no apparent nausea or vomiting Anesthetic complications: no    Last Vitals:  Vitals:   03/30/18 1700 03/30/18 1717  BP: 138/81 (!) 145/86  Pulse: 77 76  Resp: 18 16  Temp: 36.6 C 36.4 C  SpO2: 96% 96%    Last Pain:  Vitals:   03/30/18 1717  TempSrc: Oral  PainSc:                  Arby Dahir S

## 2018-03-30 NOTE — Transfer of Care (Signed)
Immediate Anesthesia Transfer of Care Note  Patient: Cassandra Sims  Procedure(s) Performed: right knee scope; synovectomy (Right )  Patient Location: PACU  Anesthesia Type:General  Level of Consciousness: drowsy and patient cooperative  Airway & Oxygen Therapy: Patient Spontanous Breathing and Patient connected to face mask  Post-op Assessment: Report given to RN and Post -op Vital signs reviewed and stable  Post vital signs: Reviewed and stable  Last Vitals:  Vitals Value Taken Time  BP 140/83 03/30/2018  4:15 PM  Temp    Pulse 78 03/30/2018  4:19 PM  Resp 16 03/30/2018  4:19 PM  SpO2 100 % 03/30/2018  4:19 PM  Vitals shown include unvalidated device data.  Last Pain:  Vitals:   03/30/18 1324  TempSrc:   PainSc: 5       Patients Stated Pain Goal: 4 (18/40/37 5436)  Complications: No apparent anesthesia complications

## 2018-03-30 NOTE — Progress Notes (Signed)
Patient complained of itching after Vancomycin 45 minutes after infusion began. Forehead and scalp were reddened. Whelps noted on forehead. Infusion stopped at 1500. Itching resolved a few minutes after infusion was stopped. Notified Dr. Wynelle Link. No additional antibiotic ordered.

## 2018-03-30 NOTE — Anesthesia Procedure Notes (Addendum)
Procedure Name: LMA Insertion Date/Time: 03/30/2018 3:25 PM Performed by: Claudia Desanctis, CRNA Pre-anesthesia Checklist: Emergency Drugs available, Patient identified, Suction available and Patient being monitored Patient Re-evaluated:Patient Re-evaluated prior to induction Oxygen Delivery Method: Circle system utilized Preoxygenation: Pre-oxygenation with 100% oxygen Induction Type: IV induction Ventilation: Mask ventilation without difficulty LMA: LMA inserted LMA Size: 3.0 Number of attempts: 1 Placement Confirmation: positive ETCO2 and breath sounds checked- equal and bilateral Tube secured with: Tape Dental Injury: Teeth and Oropharynx as per pre-operative assessment  Comments: Attempted 4 lma first, unable to get good seal, 3 lma placed without difficulty

## 2018-03-31 ENCOUNTER — Encounter (HOSPITAL_COMMUNITY): Payer: Self-pay | Admitting: Orthopedic Surgery

## 2018-03-31 DIAGNOSIS — M25861 Other specified joint disorders, right knee: Secondary | ICD-10-CM | POA: Diagnosis not present

## 2018-03-31 MED ORDER — METHOCARBAMOL 500 MG PO TABS: 500.0000 mg | ORAL_TABLET | Freq: Four times a day (QID) | ORAL | 0 refills | Status: DC | PRN

## 2018-03-31 MED ORDER — ACETAMINOPHEN 325 MG PO TABS
650.0000 mg | ORAL_TABLET | Freq: Four times a day (QID) | ORAL | Status: DC | PRN
  Administered 2018-03-31: 650 mg via ORAL
  Filled 2018-03-31: qty 2

## 2018-03-31 MED ORDER — TRAMADOL HCL 50 MG PO TABS: 50.0000 mg | ORAL_TABLET | Freq: Four times a day (QID) | ORAL | 0 refills | Status: DC | PRN

## 2018-03-31 NOTE — Progress Notes (Signed)
Discharge instruction and prescriptions were provided to the pt and pt's Daughter. After discussing the pt's plan of care upon discharge, the pt and pt's Daughter report no further questions or concerns.

## 2018-03-31 NOTE — Addendum Note (Signed)
Addendum  created 03/31/18 0807 by Lollie Sails, CRNA   Charge Capture section accepted

## 2018-03-31 NOTE — Progress Notes (Signed)
   Subjective: 1 Day Post-Op Procedure(s) (LRB): right knee scope; synovectomy (Right) Patient reports pain as mild.   Patient seen in rounds by Dr. Wynelle Link. Patient is well, and has had no acute complaints or problems other than slight discomfort in the right knee. Denies chest pain or SOB. No issues overnight, states she is ready to go home.   Objective: Vital signs in last 24 hours: Temp:  [97.6 F (36.4 C)-98.3 F (36.8 C)] 97.8 F (36.6 C) (09/10 0600) Pulse Rate:  [67-79] 69 (09/10 0600) Resp:  [15-18] 15 (09/09 2004) BP: (115-147)/(69-86) 127/80 (09/10 0600) SpO2:  [94 %-98 %] 95 % (09/10 0600) Weight:  [52.2 kg] 52.2 kg (09/09 1324)  Intake/Output from previous day:  Intake/Output Summary (Last 24 hours) at 03/31/2018 0745 Last data filed at 03/31/2018 0600 Gross per 24 hour  Intake 2609.9 ml  Output 550 ml  Net 2059.9 ml     Labs: No results for input(s): HGB in the last 72 hours. No results for input(s): WBC, RBC, HCT, PLT in the last 72 hours. No results for input(s): NA, K, CL, CO2, BUN, CREATININE, GLUCOSE, CALCIUM in the last 72 hours. No results for input(s): LABPT, INR in the last 72 hours.  Exam: General - Patient is Alert and Oriented Extremity - Neurologically intact Neurovascular intact Sensation intact distally Dorsiflexion/Plantar flexion intact Dressing - dressing C/D/I Motor Function - intact, moving foot and toes well on exam.   Past Medical History:  Diagnosis Date  . Anxiety   . Arthritis   . Asthma   . Complication of anesthesia    hard to wake up, 2009  sit up in bed to fast and BP dropped low code blue called per pt.  . Family history of adverse reaction to anesthesia    sister has [place]  . GERD (gastroesophageal reflux disease)   . Glaucoma   . Headache    hx of migrains  . HOH (hard of hearing)   . Hypertension   . Hypoglycemia   . Multiple allergies   . Peripheral vascular disease (HCC)    varicose veins , Laser vein  surgery  . PONV (postoperative nausea and vomiting)   . Pre-diabetes   . Prolonged QT interval 01/13/2018  . Rhinitis   . S/P knee replacement 01/13/2018  . 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   Weight as of this encounter: 52.2 kg. Advance diet Up with therapy D/C IV fluids  Weight bearing as tolerated. D/C O2 and pulse ox and try on room air.  Plan for discharge to home after one session of PT this AM. Will not require HHPT upon discharge. Follow-up in the office in 2 weeks with Dr. Wynelle Link.   Theresa Duty, PA-C Orthopedic Surgery 03/31/2018, 7:45 AM

## 2018-03-31 NOTE — Discharge Instructions (Signed)
Dr. Gaynelle Arabian Total Joint Specialist [facility] [address] [place], [place] [address] [phone]  [place]   Remove items at home which could result in a fall. This includes throw rugs or furniture in walking pathways.   ICE to the affected knee every three hours for 30 minutes at a time and then as needed for pain and swelling.  Continue to use ice on the knee for pain and swelling from surgery. You may notice swelling that will progress down to the foot and ankle.  This is normal after surgery.  Elevate the leg when you are not up walking on it.    Continue to use the breathing machine which will help keep your temperature down.  It is common for your temperature to cycle up and down following surgery, especially at night when you are not up moving around and exerting yourself.  The breathing machine keeps your lungs expanded and your temperature down.  Do not place pillow under knee, focus on keeping the knee straight while resting  DIET You may resume your previous home diet once your are discharged from the hospital.  DRESSING / WOUND CARE / SHOWERING You may shower 3 days after surgery, but keep the wounds dry during showering.  You may use an occlusive plastic wrap (Press'n Seal for example), NO SOAKING/SUBMERGING IN THE BATHTUB.  If the bandage gets wet, change with a clean dry gauze.  If the incision gets wet, pat the wound dry with a clean towel. You may start showering once you are discharged home but do not submerge the incision under water. Just pat the incision dry and apply a dry gauze dressing on daily. Change the surgical dressing daily and reapply a dry dressing each time.  ACTIVITY Walk with your walker as instructed. Use walker as long as suggested by your caregivers. Avoid periods of inactivity such as sitting longer than an hour when not asleep. This helps prevent blood clots.   Do not drive a  car for 6 weeks or until released by you surgeon.  Do not drive while taking narcotics.   WEIGHT BEARING Weight bearing as tolerated with assist device (walker, cane, etc) as directed, use it as long as suggested by your surgeon or therapist, typically at least 4-6 weeks.  POSTOPERATIVE CONSTIPATION PROTOCOL Constipation - defined medically as fewer than three stools per week and severe constipation as less than one stool per week.  One of the most common issues patients have following surgery is constipation.  Even if you have a regular bowel pattern at home, your normal regimen is likely to be disrupted due to multiple reasons following surgery.  Combination of anesthesia, postoperative narcotics, change in appetite and fluid intake all can affect your bowels.  In order to avoid complications following surgery, here are some recommendations in order to help you during your recovery period.  Colace (docusate) - Pick up an over-the-counter form of Colace or another stool softener and take twice a day as long as you are requiring postoperative pain medications.  Take with a full glass of water daily.  If you experience loose stools or diarrhea, hold the colace until you stool forms back up.  If your symptoms do not get better within 1 week or if they get worse, check with your doctor.  Dulcolax (bisacodyl) - Pick up over-the-counter and take as directed by the product packaging as needed to assist with the movement of  your bowels.  Take with a full glass of water.  Use this product as needed if not relieved by Colace only.   MiraLax (polyethylene glycol) - Pick up over-the-counter to have on hand.  MiraLax is a solution that will increase the amount of water in your bowels to assist with bowel movements.  Take as directed and can mix with a glass of water, juice, soda, coffee, or tea.  Take if you go more than two days without a movement. Do not use MiraLax more than once per day. Call your doctor if  you are still constipated or irregular after using this medication for 7 days in a row.  If you continue to have problems with postoperative constipation, please contact the office for further assistance and recommendations.  If you experience "the worst abdominal pain ever" or develop nausea or vomiting, please contact the office immediatly for further recommendations for treatment.  ITCHING  If you experience itching with your medications, try taking only a single pain pill, or even half a pain pill at a time.  You can also use Benadryl over the counter for itching or also to help with sleep.   MEDICATIONS See your medication summary on the After Visit Summary that the nursing staff will review with you prior to discharge.  You may have some home medications which will be placed on hold until you complete the course of blood thinner medication.  It is important for you to complete the blood thinner medication as prescribed by your surgeon.  Continue your approved medications as instructed at time of discharge.  PRECAUTIONS If you experience chest pain or shortness of breath - call 911 immediately for transfer to the hospital emergency department.  If you develop a fever greater that 101 F, purulent drainage from wound, increased redness or drainage from wound, foul odor from the wound/dressing, or calf pain - CONTACT YOUR SURGEON.                                                   FOLLOW-UP APPOINTMENTS Make sure you keep all of your appointments after your operation with your surgeon and caregivers. You should call the office at the above phone number and make an appointment for approximately two weeks after the date of your surgery or on the date instructed by your surgeon outlined in the "After Visit Summary".   RANGE OF MOTION AND STRENGTHENING EXERCISES  Rehabilitation of the knee is important following a knee injury or an operation. After just a few days of immobilization, the muscles of  the thigh which control the knee become weakened and shrink (atrophy). Knee exercises are designed to build up the tone and strength of the thigh muscles and to improve knee motion. Often times heat used for twenty to thirty minutes before working out will loosen up your tissues and help with improving the range of motion but do not use heat for the first two weeks following surgery. These exercises can be done on a training (exercise) mat, on the floor, on a table or on a bed. Use what ever works the best and is most comfortable for you Knee exercises include:   Leg Lifts - While your knee is still immobilized in a splint or cast, you can do straight leg raises. Lift the leg to 60 degrees, hold for 3  sec, and slowly lower the leg. Repeat 10-20 times 2-3 times daily. Perform this exercise against resistance later as your knee gets better.   Quad and Hamstring Sets - Tighten up the muscle on the front of the thigh (Quad) and hold for 5-10 sec. Repeat this 10-20 times hourly. Hamstring sets are done by pushing the foot backward against an object and holding for 5-10 sec. Repeat as with quad sets.   Leg Slides: Lying on your back, slowly slide your foot toward your buttocks, bending your knee up off the floor (only go as far as is comfortable). Then slowly slide your foot back down until your leg is flat on the floor again.  Angel Wings: Lying on your back spread your legs to the side as far apart as you can without causing discomfort.  A rehabilitation program following serious knee injuries can speed recovery and prevent re-injury in the future due to weakened muscles. Contact your doctor or a physical therapist for more information on knee rehabilitation.   MAKE SURE YOU:   Understand these instructions.   Get help right away if you are not doing well or get worse.    Pick up stool softner and laxative for home use following surgery while on pain medications. Do not submerge incision under  water. Please use good hand washing techniques while changing dressing each day. May shower starting three days after surgery. Please use a clean towel to pat the incision dry following showers. Continue to use ice for pain and swelling after surgery. Do not use any lotions or creams on the incision until instructed by your surgeon.

## 2018-03-31 NOTE — Plan of Care (Signed)
Plan of care discussed.   

## 2018-03-31 NOTE — Evaluation (Signed)
Physical Therapy One Time Evaluation Patient Details Name: Cassandra Sims MRN: [id number] DOB: [birth year] Today's Date: 03/31/2018   History of Present Illness  Pt is an 84 year old female s/p right knee arthoscopy for synovectomy.  Pt with hx of recent R TKR 01/05/18  Clinical Impression  Patient evaluated by Physical Therapy with no further acute PT needs identified. All education has been completed and the patient has no further questions.  Pt ambulated in hallway and practiced LE exercises.  Pt provided with HEP handout.  Pt feels ready for d/c home today. See below for any follow-up Physical Therapy or equipment needs. PT is signing off. Thank you for this referral.     Follow Up Recommendations Follow surgeon's recommendation for DC plan and follow-up therapies(plan is for no f/u)    Equipment Recommendations  None recommended by PT    Recommendations for Other Services       Precautions / Restrictions Precautions Precautions: Fall;Knee Restrictions Weight Bearing Restrictions: No Other Position/Activity Restrictions: WBAT      Mobility  Bed Mobility Overal bed mobility: Needs Assistance Bed Mobility: Supine to Sit     Supine to sit: Supervision;HOB elevated        Transfers Overall transfer level: Needs assistance Equipment used: Rolling walker (2 wheeled) Transfers: Sit to/from Stand Sit to Stand: Min guard         General transfer comment: min/guard for safety; verbal cue for hand placement  Ambulation/Gait Ambulation/Gait assistance: Min guard Gait Distance (Feet): 140 Feet Assistive device: Rolling walker (2 wheeled) Gait Pattern/deviations: Step-through pattern;Decreased stance time - right;Antalgic     General Gait Details: verbal cues for sequence, RW positioning, step length  Stairs Stairs: (verbally discussed sequence, pt reports familiar)          Wheelchair Mobility    Modified Rankin (Stroke Patients Only)       Balance                                              Pertinent Vitals/Pain Pain Assessment: 0-10 Pain Score: 3  Pain Location: right knee Pain Descriptors / Indicators: Aching;Sore Pain Intervention(s): Limited activity within patient's tolerance;Repositioned;Monitored during session    [place] expects to be discharged to:: Private residence Living Arrangements: Alone Available Help at Discharge: Family(daughter) Type of Home: House Home Access: Stairs to enter   Technical brewer of Steps: 1 Home Layout: One level Home Equipment: Environmental consultant - 2 wheels      Prior Function Level of Independence: Independent               Hand Dominance        Extremity/Trunk Assessment        Lower Extremity Assessment Lower Extremity Assessment: RLE deficits/detail RLE Deficits / Details: able to perform SLR, ankle pumps       Communication   Communication: No difficulties  Cognition Arousal/Alertness: Awake/alert Behavior During Therapy: WFL for tasks assessed/performed Overall Cognitive Status: Within Functional Limits for tasks assessed                                        General Comments      Exercises Total Joint Exercises Ankle Circles/Pumps: AROM;Both;10 reps Quad Sets: AROM;10 reps;Right Short Arc Quad: AROM;10 reps;Right  Heel Slides: AROM;10 reps;Right;Seated Hip ABduction/ADduction: AROM;10 reps;Right Straight Leg Raises: AROM;10 reps;Right Goniometric ROM: approx -4-60* AAROM R knee   Assessment/Plan    PT Assessment Patent does not need any further PT services  PT Problem List         PT Treatment Interventions      PT Goals (Current goals can be found in the Care Plan section)  Acute Rehab PT Goals PT Goal Formulation: All assessment and education complete, DC therapy    Frequency     Barriers to discharge        Co-evaluation               AM-PAC PT "6 Clicks" Daily Activity   Outcome Measure Difficulty turning over in bed (including adjusting bedclothes, sheets and blankets)?: None Difficulty moving from lying on back to sitting on the side of the bed? : A Little Difficulty sitting down on and standing up from a chair with arms (e.g., wheelchair, bedside commode, etc,.)?: A Little Help needed moving to and from a bed to chair (including a wheelchair)?: A Little Help needed walking in hospital room?: A Little Help needed climbing 3-5 steps with a railing? : A Little 6 Click Score: 19    End of Session Equipment Utilized During Treatment: Gait belt Activity Tolerance: Patient tolerated treatment well Patient left: in chair;with call bell/phone within reach Nurse Communication: Mobility status PT Visit Diagnosis: Other abnormalities of gait and mobility (R26.89)    Time: 1010-1033 PT Time Calculation (min) (ACUTE ONLY): 23 min   Charges:   PT Evaluation $PT Eval Low Complexity: 1 Low PT Treatments $Therapeutic Exercise: 8-22 mins       Carmelia Bake, PT, DPT 03/31/2018 Pager: [phone]  York Ram E 03/31/2018, 12:23 PM

## 2018-04-01 NOTE — Discharge Summary (Signed)
Physician Discharge Summary   Patient ID: BRENDALYN VALLELY MRN: [id number] DOB/AGE: Nov 17, 1932 84 y.o.  Admit date: 03/30/2018 Discharge date: 03/31/2018  Primary Diagnosis: Right knee patellar clunk syndrome   Admission Diagnoses:  Past Medical History:  Diagnosis Date  . Anxiety   . Arthritis   . Asthma   . Complication of anesthesia    hard to wake up, 2009  sit up in bed to fast and BP dropped low code blue called per pt.  . Family history of adverse reaction to anesthesia    sister has [place]  . GERD (gastroesophageal reflux disease)   . Glaucoma   . Headache    hx of migrains  . HOH (hard of hearing)   . Hypertension   . Hypoglycemia   . Multiple allergies   . Peripheral vascular disease (HCC)    varicose veins , Laser vein surgery  . PONV (postoperative nausea and vomiting)   . Pre-diabetes   . Prolonged QT interval 01/13/2018  . Rhinitis   . S/P knee replacement 01/13/2018  . Wears glasses    Discharge Diagnoses:   Active Problems:   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  Estimated body mass index is 20.7 kg/m as calculated from the following:   Height as of this encounter: 5' 2.5" (1.588 m).   Weight as of this encounter: 52.2 kg.  Procedure:  Procedure(s) (LRB): right knee scope; synovectomy (Right)   Consults: None  HPI: The patient is an 84 year old female who underwent a right total knee arthroplasty several months ago.  She has gone on to develop painful popping in the knee consistent with patellar clunk syndrome.  She presents now for arthroscopic synovectomy.  Laboratory Data: Hospital Outpatient Visit on 03/20/2018  Component Date Value Ref Range Status  . Hgb A1c MFr Bld 03/20/2018 5.6  4.8 - 5.6 % Final   Comment: (NOTE) Pre diabetes:          5.7%-6.4% Diabetes:              >6.4% Glycemic control for   <7.0% adults with diabetes   . Mean Plasma Glucose 03/20/2018 114.02  mg/dL Final   Performed at [facility] [address] [place], [place] [address]  . Sodium 03/20/2018 140  135 - 145 mmol/L Final  . Potassium 03/20/2018 3.7  3.5 - 5.1 mmol/L Final  . Chloride 03/20/2018 106  98 - 111 mmol/L Final  . CO2 03/20/2018 25  22 - 32 mmol/L Final  . Glucose, Bld 03/20/2018 94  70 - 99 mg/dL Final  . BUN 03/20/2018 16  8 - 23 mg/dL Final  . Creatinine, Ser 03/20/2018 0.58  0.44 - 1.00 mg/dL Final  . Calcium 03/20/2018 9.0  8.9 - 10.3 mg/dL Final  . GFR calc non Af Amer 03/20/2018 >60  >60 mL/min Final  . GFR calc Af Amer 03/20/2018 >60  >60 mL/min Final   Comment: (NOTE) The eGFR has been calculated using the CKD EPI equation. This calculation has not been validated in all clinical situations. eGFR's persistently <60 mL/min signify possible Chronic Kidney Disease.   Georgiann Hahn gap 03/20/2018 9  5 - 15 Final   Performed at [facility], [place] [address] [place], [place] [address]  . WBC 03/20/2018 6.4  4.0 - 10.5 K/uL Final  . RBC 03/20/2018 4.19  3.87 - 5.11 MIL/uL Final  . Hemoglobin 03/20/2018 12.1  12.0 - 15.0 g/dL Final  . HCT 03/20/2018  38.1  36.0 - 46.0 % Final  . MCV 03/20/2018 90.9  78.0 - 100.0 fL Final  . MCH 03/20/2018 28.9  26.0 - 34.0 pg Final  . MCHC 03/20/2018 31.8  30.0 - 36.0 g/dL Final  . RDW 03/20/2018 14.8  11.5 - 15.5 % Final  . Platelets 03/20/2018 273  150 - 400 K/uL Final   Performed at [facility], [place] [address] [place], [place] [address]     X-Rays:No results found.  EKG: Orders placed or performed during the hospital encounter of 01/13/18  . ED EKG  . ED EKG  . EKG 12-Lead  . EKG 12-Lead  . EKG 12-Lead  . EKG 12-Lead  . EKG  . EKG 12-Lead  . EKG 12-Lead     Hospital Course: DERETHA ERTLE is a 84 y.o. who was admitted to [facility] They were brought to the operating room on 03/30/2018 and underwent Procedure(s): right knee scope; synovectomy.  Patient tolerated the procedure well and was later transferred to  the recovery room and then to the orthopaedic floor for postoperative care. They were given PO and IV analgesics for pain control following their surgery. They were given 24 hours of postoperative antibiotics of  Anti-infectives (From admission, onward)   Start     Dose/Rate Route Frequency Ordered Stop   03/30/18 1315  vancomycin (VANCOCIN) IVPB 1000 mg/200 mL premix     1,000 mg 200 mL/hr over 60 Minutes Intravenous On call to O.R. 03/30/18 1255 03/30/18 1500     PT was ordered for one session. Patient had a good night on the evening of surgery. She was seen during rounds on POD #1 and was ready to go home. Incisions were clean, dry, and intact with no drainage. Pain in the knee was reported as mild. She worked with physical therapy for one session and was ready for d/c. She was discharged to home that morning in stable condition.   Diet: Regular diet Activity: WBAT Follow-up: in 2 weeks with Dr. Wynelle Link Disposition: Home with no physical therapy required Discharged Condition: stable   Discharge Instructions    Call MD / Call 911   Complete by:  As directed    If you experience chest pain or shortness of breath, CALL 911 and be transported to the hospital emergency room.  If you develope a fever above 101 F, pus (white drainage) or increased drainage or redness at the wound, or calf pain, call your surgeon's office.   Constipation Prevention   Complete by:  As directed    Drink plenty of fluids.  Prune juice may be helpful.  You may use a stool softener, such as Colace (over the counter) 100 mg twice a day.  Use MiraLax (over the counter) for constipation as needed.   Diet - low sodium heart healthy   Complete by:  As directed    Driving restrictions   Complete by:  As directed    No driving for two weeks   Weight bearing as tolerated   Complete by:  As directed      Allergies as of 03/31/2018      Reactions   Penicillins Shortness Of Breath, Swelling   Has patient had a PCN  reaction causing immediate rash, facial/tongue/throat swelling, SOB or lightheadedness with hypotension: Yes Has patient had a PCN reaction causing severe rash involving mucus membranes or skin necrosis: No Has patient had a PCN reaction that required hospitalization: No Has patient had a  PCN reaction occurring within the last 10 years: No If all of the above answers are "NO", then may proceed with Cephalosporin use.   Adhesive [tape] Itching   BAND-AID TAPE-skin redness/itching   Clindamycin/lincomycin Other (See Comments)   Severe joint pain.   Codeine Nausea And Vomiting   Darvocet [propoxyphene N-acetaminophen] Other (See Comments)   HTN   Dilaudid [hydromorphone Hcl] Nausea And Vomiting   DROP BLOOD PRESSURE HYPOTENSIVE   Doxycycline Other (See Comments)   Pt states she tolerates OKAY   Flagyl [metronidazole] Other (See Comments)   Joint pains   Lactose Intolerance (gi)    Levofloxacin    Joint pain   Macrodantin [nitrofurantoin Macrocrystal]    Morphine And Related    hallucinate   Premarin [conjugated Estrogens] Itching   Tramadol Hcl Other (See Comments)   Causes insomnia   Vancomycin Hives, Itching   Zantac [ranitidine Hcl]    Itching    Zofran [ondansetron Hcl]    HTN   Nitrofuran Derivatives Rash   Sulfa Antibiotics Rash      Medication List    TAKE these medications   acetaminophen 500 MG tablet Commonly known as:  TYLENOL Take 1,000 mg by mouth every 8 (eight) hours as needed for mild pain.   ACIDOPHILUS PO Take 1 capsule by mouth 3 (three) times a week.   albuterol 108 (90 Base) MCG/ACT inhaler Commonly known as:  PROVENTIL HFA;VENTOLIN HFA Inhale 2 puffs into the lungs every 4 (four) hours as needed for wheezing. Reported on 10/10/2015   amLODipine 5 MG tablet Commonly known as:  NORVASC Take 5 mg by mouth daily.   Azelastine HCl 0.15 % Soln Place 1 spray into both nostrils 2 (two) times daily.   bimatoprost 0.01 % Soln Commonly known as:   LUMIGAN Place 1 drop into both eyes at bedtime.   bisacodyl 10 MG suppository Commonly known as:  DULCOLAX Place 1 suppository (10 mg total) rectally daily as needed for moderate constipation.   budesonide-formoterol 160-4.5 MCG/ACT inhaler Commonly known as:  SYMBICORT Inhale two puffs twice daily to prevent cough or wheeze.   CALTRATE 600+D3 PO Take 1 tablet by mouth daily.   cholecalciferol 1000 units tablet Commonly known as:  VITAMIN D Take 1,000 Units by mouth daily with lunch.   docusate sodium 100 MG capsule Commonly known as:  COLACE Take 1 capsule (100 mg total) by mouth 2 (two) times daily.   escitalopram 10 MG tablet Commonly known as:  LEXAPRO Take 10 mg by mouth at bedtime.   FASENRA 30 MG/ML Sosy Generic drug:  Benralizumab Inject 30 mg into the skin every 8 (eight) weeks.   fluticasone 50 MCG/ACT nasal spray Commonly known as:  FLONASE Place 1 spray into both nostrils 2 (two) times daily as needed for allergies.   HYDROcodone-acetaminophen 5-325 MG tablet Commonly known as:  NORCO/VICODIN Take 0.5-1 tablets by mouth every 8 (eight) hours as needed. For severe knee pain   LORazepam 0.5 MG tablet Commonly known as:  ATIVAN Take 1 tablet (0.5 mg total) by mouth at bedtime as needed for anxiety or sleep.   LUTEIN 20 Caps Take 20 mg by mouth daily with lunch.   Melatonin 3 MG Tabs Take 3 mg by mouth at bedtime.   methocarbamol 500 MG tablet Commonly known as:  ROBAXIN Take 1 tablet (500 mg total) by mouth every 6 (six) hours as needed for muscle spasms.   metoCLOPramide 5 MG tablet Commonly known as:  REGLAN Take  1-2 tablets (5-10 mg total) by mouth every 8 (eight) hours as needed for nausea (if ondansetron (ZOFRAN) ineffective.).   mometasone 0.1 % ointment Commonly known as:  ELOCON Apply topically 2 (two) times daily. What changed:    how much to take  when to take this  reasons to take this   mometasone-formoterol 100-5 MCG/ACT  Aero Commonly known as:  DULERA Take 2 puffs first thing in am and then another 2 puffs about 12 hours later. What changed:    how much to take  how to take this  when to take this  additional instructions   montelukast 10 MG tablet Commonly known as:  SINGULAIR Take 10 mg by mouth at bedtime.   multivitamin with minerals Tabs tablet Take 1 tablet by mouth daily. Nature Made Multi Vitamin for Her 50+ W/No Iron   NONFORMULARY OR COMPOUNDED ITEM Place 1 drop into both eyes 4 (four) times daily. Autologous serum eye drops (ASED) made specifically for patient from patient's own blood serum and plasma   ondansetron 4 MG tablet Commonly known as:  ZOFRAN Take 1 tablet (4 mg total) by mouth every 6 (six) hours as needed for nausea.   pantoprazole 40 MG tablet Commonly known as:  PROTONIX Take 1 tablet (40 mg total) by mouth daily at 6 (six) AM.   polyethylene glycol packet Commonly known as:  MIRALAX / GLYCOLAX Take 17 g by mouth daily as needed for mild constipation.   psyllium 58.6 % powder Commonly known as:  METAMUCIL Take 1 packet by mouth daily. 14.3 g (1 heaping tablespoon) every morning.   REFRESH TEARS 0.5 % Soln Generic drug:  carboxymethylcellulose Place 1 drop into both eyes 5 (five) times daily as needed (for dry eyes).   sodium phosphate 7-19 GM/118ML Enem Place 133 mLs (1 enema total) rectally once as needed for severe constipation.   tacrolimus 0.1 % ointment Commonly known as:  PROTOPIC Apply topically 2 (two) times daily. What changed:    how much to take  when to take this  reasons to take this   telmisartan 80 MG tablet Commonly known as:  MICARDIS Take 1 tablet (80 mg total) by mouth daily.   traMADol 50 MG tablet Commonly known as:  ULTRAM Take 1-2 tablets (50-100 mg total) by mouth every 6 (six) hours as needed for moderate pain. What changed:  reasons to take this   Turmeric 450 MG Caps Take 450 mg by mouth daily with lunch.             Discharge Care Instructions  (From admission, onward)         Start     Ordered   03/31/18 0000  Weight bearing as tolerated     03/31/18 0750         Follow-up Information    Gaynelle Arabian, MD. Schedule an appointment as soon as possible for a visit on 04/14/2018.   Specialty:  Orthopedic Surgery Contact information: [address] [place] [address] [phone]           Signed: Theresa Duty, PA-C Orthopedic Surgery 04/01/2018, 8:21 AM

## 2018-05-22 ENCOUNTER — Ambulatory Visit (INDEPENDENT_AMBULATORY_CARE_PROVIDER_SITE_OTHER): Payer: Medicare HMO | Admitting: *Deleted

## 2018-05-22 DIAGNOSIS — J4540 Moderate persistent asthma, uncomplicated: Secondary | ICD-10-CM | POA: Diagnosis not present

## 2018-05-22 MED ORDER — BENRALIZUMAB 30 MG/ML ~~LOC~~ SOSY
30.0000 mg | PREFILLED_SYRINGE | SUBCUTANEOUS | Status: DC
  Administered 2018-05-22 – 2021-05-23 (×20): 30 mg via SUBCUTANEOUS

## 2018-06-09 ENCOUNTER — Ambulatory Visit: Payer: Medicare HMO | Admitting: Allergy and Immunology

## 2018-06-09 ENCOUNTER — Encounter: Payer: Self-pay | Admitting: Allergy and Immunology

## 2018-06-09 VITALS — BP 110/72 | HR 66 | Resp 14 | Ht 63.0 in | Wt 118.4 lb

## 2018-06-09 DIAGNOSIS — J4540 Moderate persistent asthma, uncomplicated: Secondary | ICD-10-CM

## 2018-06-09 DIAGNOSIS — J3089 Other allergic rhinitis: Secondary | ICD-10-CM

## 2018-06-09 DIAGNOSIS — L2089 Other atopic dermatitis: Secondary | ICD-10-CM | POA: Diagnosis not present

## 2018-06-09 DIAGNOSIS — M3500 Sicca syndrome, unspecified: Secondary | ICD-10-CM

## 2018-06-09 MED ORDER — FAMOTIDINE 40 MG PO TABS: 40.0000 mg | ORAL_TABLET | Freq: Every day | ORAL | 5 refills | Status: DC

## 2018-06-09 MED ORDER — OMEPRAZOLE 40 MG PO CPDR: 40.0000 mg | DELAYED_RELEASE_CAPSULE | Freq: Every day | ORAL | 5 refills | Status: DC

## 2018-06-09 NOTE — Patient Instructions (Addendum)
  1. For skin inflammation:   A. Elidel followed by mometasone 0.1% ointment 1-2 times per day  2. For nose issue:   A. Flonase 1 spray each nostril 2 time per day  B. montelukast 10 mg tablet 1 time per day  C. Azelastine 1 spray each nostril 2 times per day  3. For lung issue:   A.  Dulera 100 - two inhalations two times per day  B.  Fasenra every 8 weeks  4. For reflux issue:   A. Omeprazole 40mg  in AM  B. Famotidine 40mg  in PM   C. Minimize caffeine  5. If needed:   A. Proventil HFA (or similar) 2 inhalations two times per day  B. Nasal saline  6. Return to clinic in 4 weeks or earlier if problem

## 2018-06-09 NOTE — Progress Notes (Signed)
Follow-up Note  Referring Provider: Thomes Dinning, MD Primary Provider: Thomes Dinning, MD Date of Office Visit: 06/09/2018  Subjective:   Cassandra Sims (DOB: [birth year]) is a 84 y.o. female who returns to the [facility] on 06/09/2018 in re-evaluation of the following:  HPI: Cassandra Sims returns to this clinic in reevaluation of her asthma and allergic rhinitis and history of atopic dermatitis and pruritus and a history of dry eye syndrome.  I last saw her in this clinic on 26 Nov 2017.  Apparently after her total knee replacement performed in June 2019 she ended up spending 6 days in the ICU for hyponatremia and hypotension and anemia.  Fortunately, she survived that event and she has done relatively well regarding her medical status in general but there are several issues that are bothering her.  She is itchy once again.  It is the itchiness of her thorax and her upper extremities that is driving her crazy.  She did visit with a dermatologist this week who address some issues with moles but did not address the issue with her itchiness.  In the past she has developed significant problems with inflammatory dermatosis requiring the administration of anti-inflammatory agents topically but she is not using any of these agents at this point in time.  She believes that since she started Lexapro in June or July of this year she has had more itchiness.  She has coughing and a raspy voice and throat clearing.  She has been having a lot of burping.  She is not treating any reflux.  Overall her airway issue is doing quite well.  She does not have any shortness of breath or chest tightness and she does not use a short acting bronchodilator while continuing to utilize inhaled steroids and mepolizumab.  She has very little issue with her nose as well while continuing on a collection of nasal sprays.  She remains away from using oral antihistamines because of her sicca syndrome.  She  did obtain a flu vaccine this year.  Allergies as of 06/09/2018      Reactions   Penicillins Shortness Of Breath, Swelling   Has patient had a PCN reaction causing immediate rash, facial/tongue/throat swelling, SOB or lightheadedness with hypotension: Yes Has patient had a PCN reaction causing severe rash involving mucus membranes or skin necrosis: No Has patient had a PCN reaction that required hospitalization: No Has patient had a PCN reaction occurring within the last 10 years: No If all of the above answers are "NO", then may proceed with Cephalosporin use.   Adhesive [tape] Itching   BAND-AID TAPE-skin redness/itching   Clindamycin/lincomycin Other (See Comments)   Severe joint pain.   Codeine Nausea And Vomiting   Darvocet [propoxyphene N-acetaminophen] Other (See Comments)   HTN   Dilaudid [hydromorphone Hcl] Nausea And Vomiting   DROP BLOOD PRESSURE HYPOTENSIVE   Doxycycline Other (See Comments)   Pt states she tolerates OKAY   Flagyl [metronidazole] Other (See Comments)   Joint pains   Lactose Intolerance (gi)    Levofloxacin    Joint pain   Macrodantin [nitrofurantoin Macrocrystal]    Morphine And Related    hallucinate   Premarin [conjugated Estrogens] Itching   Tramadol Hcl Other (See Comments)   Causes insomnia   Vancomycin Hives, Itching   Zantac [ranitidine Hcl]    Itching    Zofran [ondansetron Hcl]    HTN   Nitrofuran Derivatives Rash   Sulfa Antibiotics Rash  Medication List      acetaminophen 500 MG tablet Commonly known as:  TYLENOL Take 1,000 mg by mouth every 8 (eight) hours as needed for mild pain.   ACIDOPHILUS PO Take 1 capsule by mouth 3 (three) times a week.   albuterol 108 (90 Base) MCG/ACT inhaler Commonly known as:  PROVENTIL HFA;VENTOLIN HFA Inhale 2 puffs into the lungs every 4 (four) hours as needed for wheezing. Reported on 10/10/2015   amLODipine 5 MG tablet Commonly known as:  NORVASC Take 5 mg by mouth daily.     Azelastine HCl 0.15 % Soln Place 1 spray into both nostrils 2 (two) times daily.   bimatoprost 0.01 % Soln Commonly known as:  LUMIGAN Place 1 drop into both eyes at bedtime.   bisacodyl 10 MG suppository Commonly known as:  DULCOLAX Place 1 suppository (10 mg total) rectally daily as needed for moderate constipation.   budesonide-formoterol 160-4.5 MCG/ACT inhaler Commonly known as:  SYMBICORT Inhale two puffs twice daily to prevent cough or wheeze.   CALTRATE 600+D3 PO Take 1 tablet by mouth daily.   cholecalciferol 1000 units tablet Commonly known as:  VITAMIN D Take 1,000 Units by mouth daily with lunch.   docusate sodium 100 MG capsule Commonly known as:  COLACE Take 1 capsule (100 mg total) by mouth 2 (two) times daily.   escitalopram 10 MG tablet Commonly known as:  LEXAPRO Take 10 mg by mouth at bedtime.   FASENRA 30 MG/ML [place] Generic drug:  Benralizumab Inject 30 mg into the skin every 8 (eight) weeks.   fluticasone 50 MCG/ACT nasal spray Commonly known as:  FLONASE Place 1 spray into both nostrils 2 (two) times daily as needed for allergies.   HYDROcodone-acetaminophen 5-325 MG tablet Commonly known as:  NORCO/VICODIN Take 0.5-1 tablets by mouth every 8 (eight) hours as needed. For severe knee pain   LORazepam 0.5 MG tablet Commonly known as:  ATIVAN Take 1 tablet (0.5 mg total) by mouth at bedtime as needed for anxiety or sleep.   LUTEIN 20 Caps Take 20 mg by mouth daily with lunch.   Melatonin 3 MG Tabs Take 3 mg by mouth at bedtime.   methocarbamol 500 MG tablet Commonly known as:  ROBAXIN Take 1 tablet (500 mg total) by mouth every 6 (six) hours as needed for muscle spasms.   metoCLOPramide 5 MG tablet Commonly known as:  REGLAN Take 1-2 tablets (5-10 mg total) by mouth every 8 (eight) hours as needed for nausea (if ondansetron (ZOFRAN) ineffective.).   mometasone 0.1 % ointment Commonly known as:  ELOCON Apply topically 2 (two) times  daily.   mometasone-formoterol 100-5 MCG/ACT Aero Commonly known as:  DULERA Take 2 puffs first thing in am and then another 2 puffs about 12 hours later.   montelukast 10 MG tablet Commonly known as:  SINGULAIR Take 10 mg by mouth at bedtime.   multivitamin with minerals Tabs tablet Take 1 tablet by mouth daily. Nature Made Multi Vitamin for Her 50+ W/No Iron   NONFORMULARY OR COMPOUNDED ITEM Place 1 drop into both eyes 4 (four) times daily. Autologous serum eye drops (ASED) made specifically for patient from patient's own blood serum and plasma   ondansetron 4 MG tablet Commonly known as:  ZOFRAN Take 1 tablet (4 mg total) by mouth every 6 (six) hours as needed for nausea.   pantoprazole 40 MG tablet Commonly known as:  PROTONIX Take 1 tablet (40 mg total) by mouth daily at 6 (six)  AM.   polyethylene glycol packet Commonly known as:  MIRALAX / GLYCOLAX Take 17 g by mouth daily as needed for mild constipation.   psyllium 58.6 % powder Commonly known as:  METAMUCIL Take 1 packet by mouth daily. 14.3 g (1 heaping tablespoon) every morning.   REFRESH TEARS 0.5 % Soln Generic drug:  carboxymethylcellulose Place 1 drop into both eyes 5 (five) times daily as needed (for dry eyes).   sodium phosphate 7-19 GM/118ML Enem Place 133 mLs (1 enema total) rectally once as needed for severe constipation.   tacrolimus 0.1 % ointment Commonly known as:  PROTOPIC Apply topically 2 (two) times daily.   telmisartan 80 MG tablet Commonly known as:  MICARDIS Take 1 tablet (80 mg total) by mouth daily.   traMADol 50 MG tablet Commonly known as:  ULTRAM Take 1-2 tablets (50-100 mg total) by mouth every 6 (six) hours as needed for moderate pain.   Turmeric 450 MG Caps Take 450 mg by mouth daily with lunch.       Past Medical History:  Diagnosis Date  . Anxiety   . Arthritis   . Asthma   . Complication of anesthesia    hard to wake up, 2009  sit up in bed to fast and BP dropped  low code blue called per pt.  . Family history of adverse reaction to anesthesia    sister has [place]  . GERD (gastroesophageal reflux disease)   . Glaucoma   . Headache    hx of migrains  . HOH (hard of hearing)   . Hypertension   . Hypoglycemia   . Multiple allergies   . Peripheral vascular disease (HCC)    varicose veins , Laser vein surgery  . PONV (postoperative nausea and vomiting)   . Pre-diabetes   . Prolonged QT interval 01/13/2018  . Rhinitis   . S/P knee replacement 01/13/2018  . Wears glasses     Past Surgical History:  Procedure Laterality Date  . ABDOMINAL HYSTERECTOMY    . APPENDECTOMY    . arthroscopy right knee    . BREAST SURGERY     biopsy left   . COLECTOMY  2008   obstruction  . DIAGNOSTIC LAPAROSCOPY     exp  . DILATION AND CURETTAGE OF UTERUS    . EYE SURGERY     cataract   bil and retina surgery right eye, glaucoma surgery, right eye  . FUNCTIONAL ENDOSCOPIC SINUS SURGERY  2009   bilat ethm,frontal,max  . JOINT REPLACEMENT     right total knee arthroplasty 01-05-18 aluisio  . KNEE ARTHROSCOPY     right with synovectomy Dr. Wynelle Link 03-30-18   . KNEE ARTHROSCOPY Right 03/30/2018   Procedure: right knee scope; synovectomy;  Surgeon: Gaynelle Arabian, MD;  Location: [facility];  Service: Orthopedics;  Laterality: Right;  56min  . NASAL SINUS SURGERY Bilateral 10/26/2012   Procedure: BILATERAL ENDOSCOPIC REVISION ETHMOID MAXILLARY AND FRONTAL SINUS SURGERY  ;  Surgeon: Izora Gala, MD;  Location: [facility];  Service: ENT;  Laterality: Bilateral;  . OVARIAN CYST SURGERY    . TOTAL KNEE ARTHROPLASTY Right 01/05/2018   Procedure: RIGHT TOTAL KNEE ARTHROPLASTY;  Surgeon: Gaynelle Arabian, MD;  Location: [facility];  Service: Orthopedics;  Laterality: Right;  . TUBAL LIGATION    . URETER SURGERY     cut accidentally during exp lap-ovarien cyst  . VEIN LIGATION      Review of systems negative except as noted in HPI /  PMHx or noted below:  Review of  Systems  Constitutional: Negative.   HENT: Negative.   Eyes: Negative.   Respiratory: Negative.   Cardiovascular: Negative.   Gastrointestinal: Negative.   Genitourinary: Negative.   Musculoskeletal: Negative.   Skin: Negative.   Neurological: Negative.   Endo/Heme/Allergies: Negative.   Psychiatric/Behavioral: Negative.      Objective:   Vitals:   06/09/18 1605  BP: 110/72  Pulse: 66  Resp: 14  SpO2: 98%   Height: 5\' 3"  (160 cm)  Weight: 118 lb 6.4 oz (53.7 kg)   Physical Exam  Constitutional:  Raspy voice  HENT:  Head: Normocephalic.  Right Ear: Tympanic membrane, external ear and ear canal normal.  Left Ear: Tympanic membrane, external ear and ear canal normal.  Nose: Nose normal. No mucosal edema or rhinorrhea.  Mouth/Throat: Uvula is midline, oropharynx is clear and moist and mucous membranes are normal. No oropharyngeal exudate.  Eyes: Conjunctivae are normal.  Neck: Trachea normal. No tracheal tenderness present. No tracheal deviation present. No thyromegaly present.  Cardiovascular: Normal rate, regular rhythm, S1 normal, S2 normal and normal heart sounds.  No murmur heard. Pulmonary/Chest: Breath sounds normal. No stridor. No respiratory distress. She has no wheezes. She has no rales.  Musculoskeletal: She exhibits no edema.  Lymphadenopathy:       Head (right side): No tonsillar adenopathy present.       Head (left side): No tonsillar adenopathy present.    She has no cervical adenopathy.  Neurological: She is alert.  Skin: Rash (Erythematous slightly indurated patches abdomen) noted. She is not diaphoretic. No erythema. Nails show no clubbing.    Diagnostics:    Spirometry was performed and demonstrated an FEV1 of 1.37 at 74 % of predicted.  The patient had an Asthma Control Test with the following results: ACT Total Score: 20.    Assessment and Plan:   1. Asthma, moderate persistent, well-controlled   2. Other allergic rhinitis   3. Other  atopic dermatitis   4. Sicca syndrome ([place])     1. For skin inflammation:   A. Elidel followed by mometasone 0.1% ointment 1-2 times per day  2. For nose issue:   A. Flonase 1 spray each nostril 2 time per day  B. montelukast 10 mg tablet 1 time per day  C. Azelastine 1 spray each nostril 2 times per day  3. For lung issue:   A.  Dulera 100 - two inhalations two times per day  B.  Fasenra every 8 weeks  4. For reflux issue:   A. Omeprazole 40mg  in AM  B. Famotidine 40mg  in PM   C. Minimize caffeine  5. If needed:   A. Proventil HFA (or similar) 2 inhalations two times per day  B. Nasal saline  6. Return to clinic in 4 weeks or earlier if problem  I will have Calah return to this clinic in 4 weeks to assess her response to topical anti-inflammatory agents for what appears to be a component of inflammatory dermatosis.  As well, she will need to discuss with her family doctor about the use of Lexapro as the use of this medication does appear to coincide temporarily with the onset of her pruritus.  She will remain on anti-inflammatory agents for airway as noted above and we will have her start therapy for reflux assuming that her cough and throat clearing and raspy voice is secondary to LPR.  Further evaluation treatment will be based on upon her response to  this approach.  Cassandra Katz, MD [facility] [place]

## 2018-06-10 ENCOUNTER — Telehealth: Payer: Self-pay | Admitting: Allergy and Immunology

## 2018-06-10 ENCOUNTER — Encounter: Payer: Self-pay | Admitting: Allergy and Immunology

## 2018-06-10 MED ORDER — MOMETASONE FUROATE 0.1 % EX OINT: TOPICAL_OINTMENT | Freq: Two times a day (BID) | CUTANEOUS | 3 refills | Status: DC

## 2018-06-10 MED ORDER — PIMECROLIMUS 1 % EX CREA: TOPICAL_CREAM | CUTANEOUS | 2 refills | Status: DC

## 2018-06-10 NOTE — Telephone Encounter (Signed)
Pt called and no meds called into pharmacy. Need to have Elidel and Mometasone called into [facility].[phone].

## 2018-06-10 NOTE — Telephone Encounter (Signed)
Sent in refills patient was called and advised of refills being sent

## 2018-06-15 ENCOUNTER — Other Ambulatory Visit: Payer: Self-pay

## 2018-06-15 MED ORDER — FLUTICASONE PROPIONATE 50 MCG/ACT NA SUSP: NASAL | 5 refills | Status: DC

## 2018-06-16 ENCOUNTER — Other Ambulatory Visit: Payer: Self-pay

## 2018-06-16 MED ORDER — FLUTICASONE PROPIONATE 50 MCG/ACT NA SUSP: NASAL | 5 refills | Status: DC

## 2018-07-17 ENCOUNTER — Telehealth: Payer: Self-pay | Admitting: Pulmonary Disease

## 2018-07-17 MED ORDER — MOMETASONE FURO-FORMOTEROL FUM 100-5 MCG/ACT IN AERO: 2.0000 | INHALATION_SPRAY | Freq: Two times a day (BID) | RESPIRATORY_TRACT | 0 refills | Status: DC

## 2018-07-17 NOTE — Telephone Encounter (Signed)
Pt was last seen by BQ 10/06/17 and was told to return 2 months from that appt. Pt did have an appt scheduled with BQ 11/27/17 but this appt was cancelled due to pt having knee surgery and said she would call back to reschedule. Pt has not rescheduled her f/u appt with BQ.   We do have a sample of Dulera 100 which I have placed up front for pt. Attempted to call pt to let her know this and also to see if we could go ahead and schedule her a f/u appt with BQ but when I tried calling pt, the line rang and then I received a sound that sounded like a fax machine. Unable to leave a message for pt.  If pt returns back, the sample is up front for her and we also need to schedule pt a f/u appt. Please schedule pt's appt when she calls back. Thanks!

## 2018-07-18 IMAGING — CT CT CHEST W/O CM
2 of 3 series · 15 of 36 positions shown, 18 images · non-contrast
Comparison: Chest CT June 19, 2016; chest radiograph October 14, 2016

CLINICAL DATA: Pulmonary nodule

EXAM:
CT CHEST WITHOUT CONTRAST
TECHNIQUE: Multidetector CT imaging of the chest was performed following the
standard protocol without IV contrast.

[Series 2: thorax · axial · 0.65mm/px · z∈[-285,-33]mm · 12 of 148 slices shown, 15 images]
[im 11/148  mediastinal]
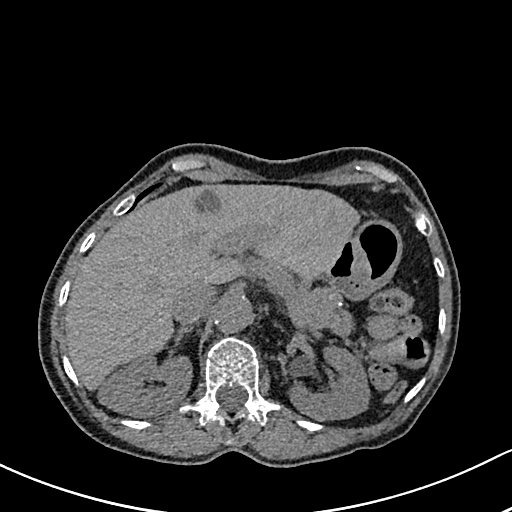
[im 11/148  lung]
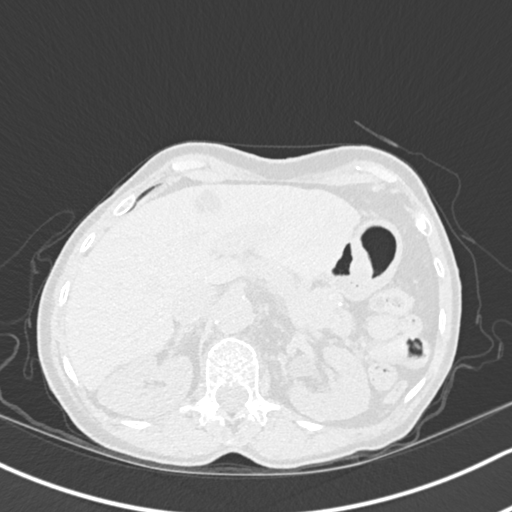
[im 22/148  lung]
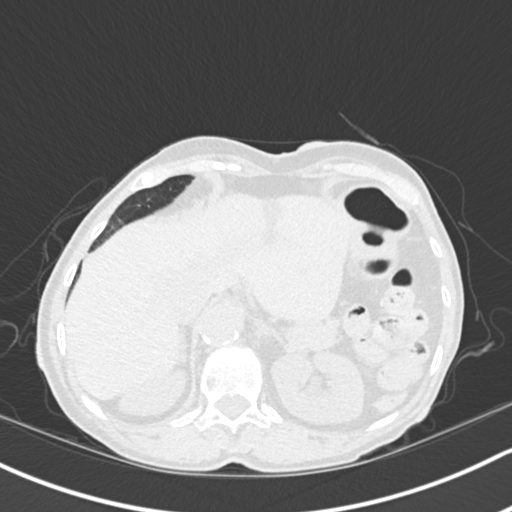
[im 33/148  lung]
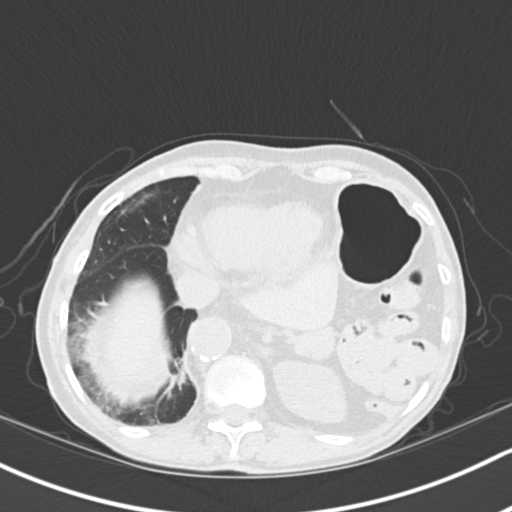
[im 44/148  lung]
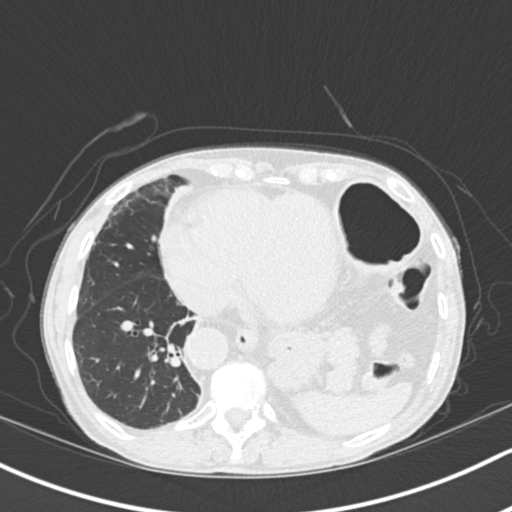
[im 55/148  mediastinal]
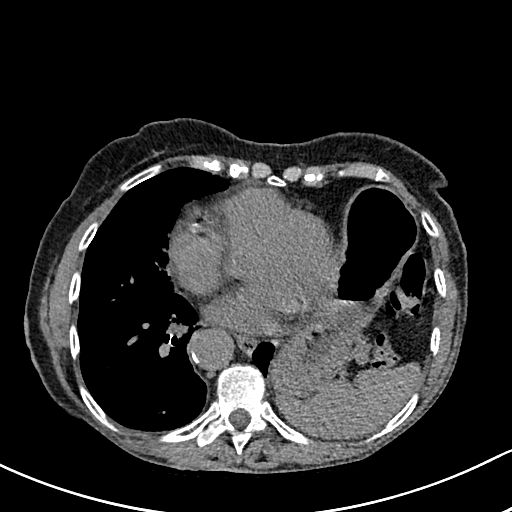
[im 55/148  lung]
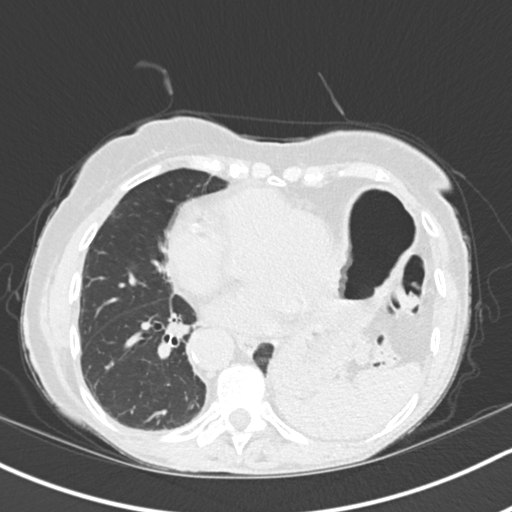
[im 66/148  lung]
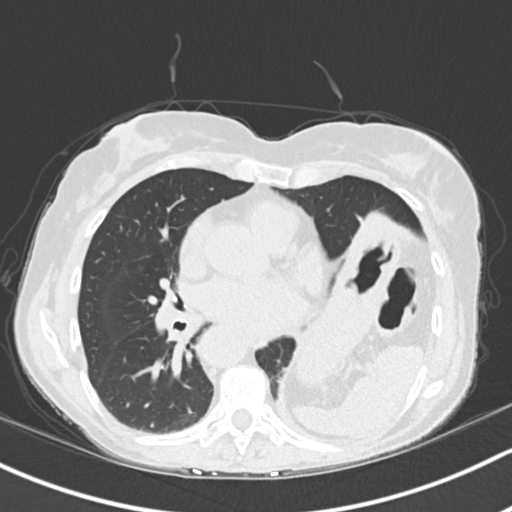
[im 82/148  lung]
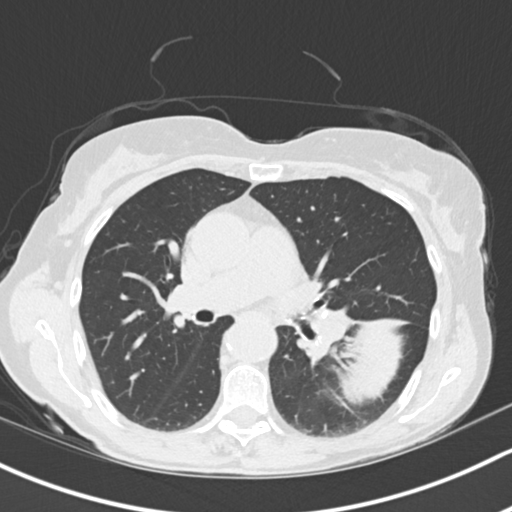
[im 93/148  lung]
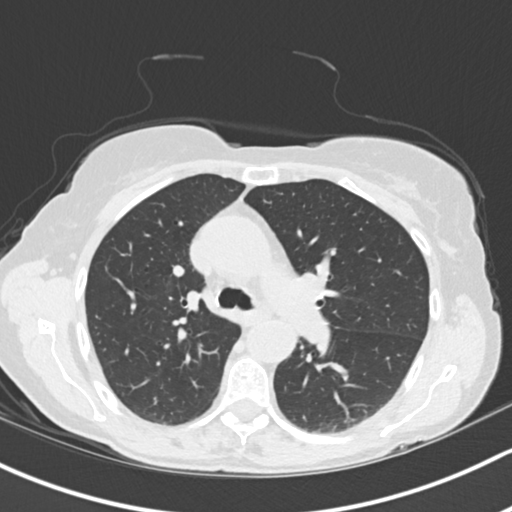
[im 104/148  mediastinal]
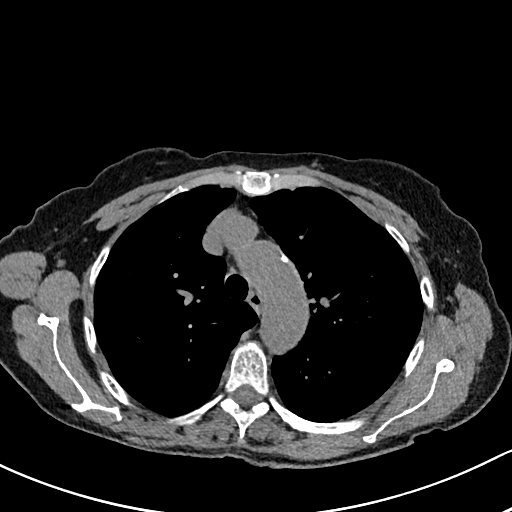
[im 104/148  lung]
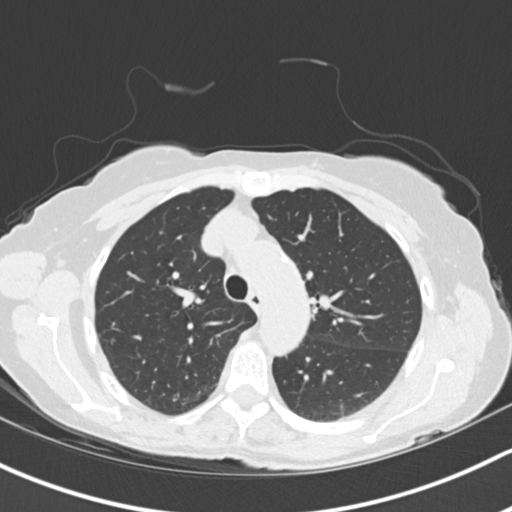
[im 115/148  lung]
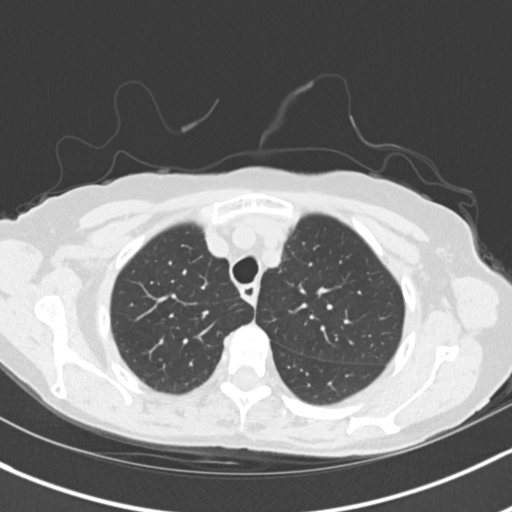
[im 126/148  lung]
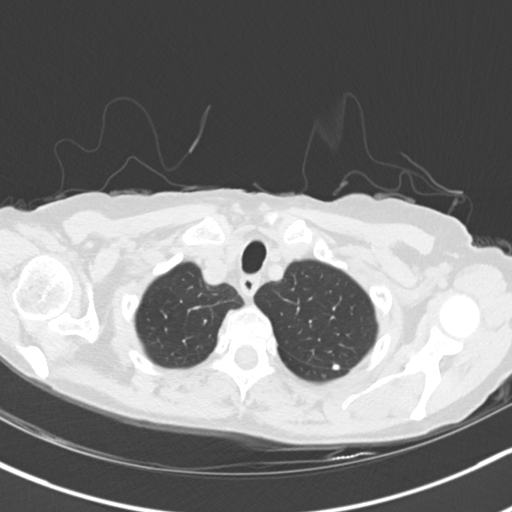
[im 137/148  lung]
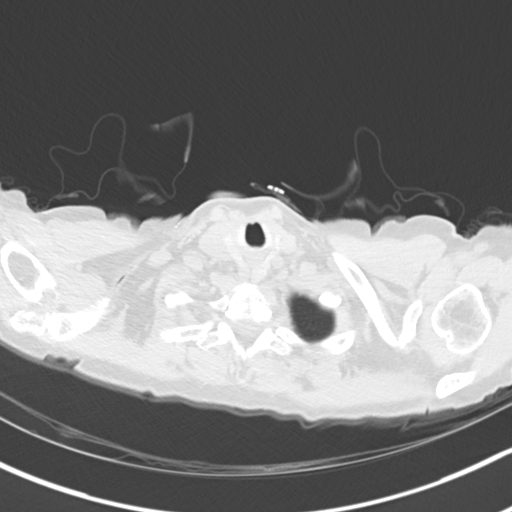

[Series 5: coronal · coronal · 0.57mm/px · 3 of 108 slices shown]
[im 22/108  lung]
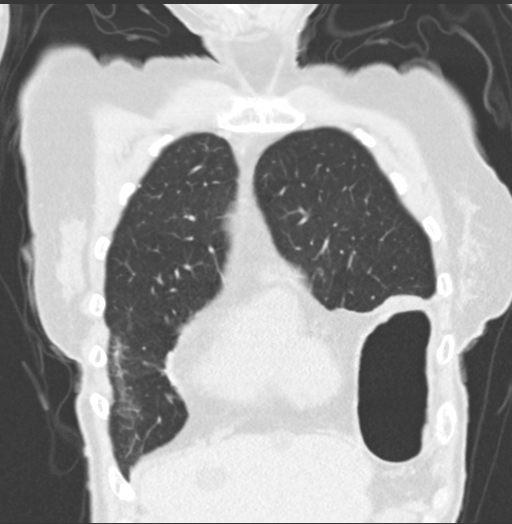
[im 43/108  lung]
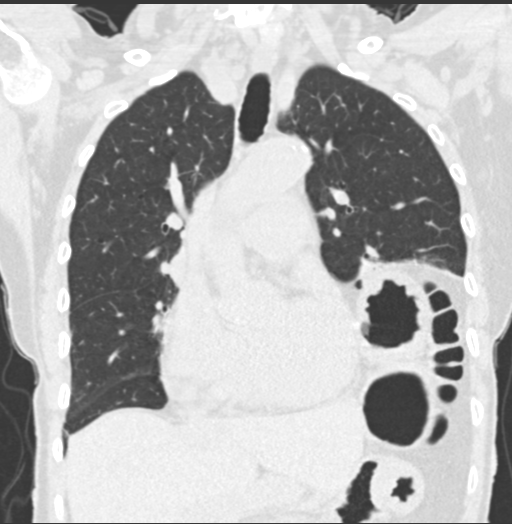
[im 65/108  lung]
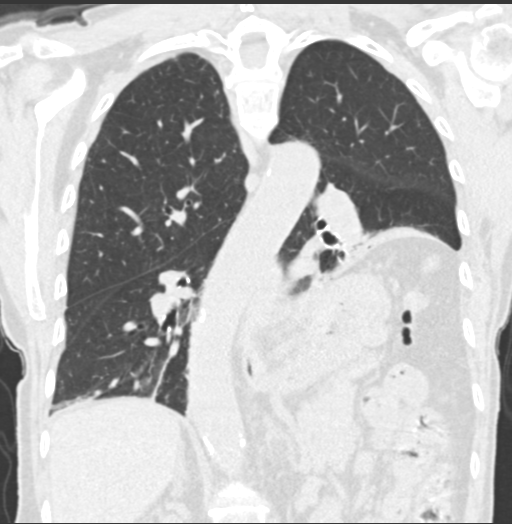

[15 of 36 positions shown; findings below may reference images not displayed]

FINDINGS: Cardiovascular: There is no demonstrable thoracic aortic aneurysm.
There are are scattered foci of calcification in the proximal right
common carotid and left subclavian arteries. Visualized great
vessels otherwise appear unremarkable. The right common and left
common carotid arteries arise as a common trunk, an anatomic
variant. There are scattered foci of coronary artery calcification.
Pericardium is not appreciably thickened.

Mediastinum/Nodes: There is stable calcification in the right lobe
of the thyroid. Thyroid appears somewhat inhomogeneous but stable in
appearance. There is no appreciable thoracic adenopathy.

Lungs/Pleura: There are calcified granulomas along the major fissure
on the left. A 2 mm nodular opacity abutting the major fissure in
the posterior segment of the right upper lobe seen on axial slice 28
series 3 is stable. There is a calcified granuloma more inferiorly
in the posterior segment of the right upper lobe. Along the minor
fissure on the right anteriorly measuring 6 x 5 mm, stable. There is
a stable 6 x 5 mm nodular opacity abutting the pleura in the medial
segment of the right lower lobe, unchanged. No new parenchymal
nodular lesions are evident. There is scarring in the right base
region. There is atelectatic change in the left base, stable and
probably enlarged part due to the chronic elevation of the left
hemidiaphragm. There is no new parenchymal lung opacity on either
side.

Upper Abdomen: Cystic areas in the liver remain stable. No new liver
lesions are evident in visualized portions of liver. There is aortic
atherosclerosis in the abdominal aorta and proximal left renal
artery, stable. Visualized upper abdominal structures otherwise
appear unremarkable.

Musculoskeletal: No blastic or lytic bone lesions are evident.
Stable elevation of the left hemidiaphragm noted.
IMPRESSION: Stable elevation left hemidiaphragm. Nodular opacities bilaterally
remain stable. There are several calcified granulomas as well as
noncalcified lesions as noted above, stable. No new parenchymal lung
lesion. There is scarring in the right lung base. Is atelectatic
change in the left base, likely due to the chronic elevation of the
left hemidiaphragm, stable.

Note that the nodular opacity in the medial basilar region is on the
right, not on the left as was stated in prior report. This lesion
remains stable.

No evident adenopathy. Stable changes of multinodular goiter in the
thyroid.

Areas of atherosclerotic calcification noted. Foci of coronary
artery calcification noted.

## 2018-07-20 NOTE — Telephone Encounter (Signed)
Called and spoke with Patient.  Informed her of Dulera sample being placed up at front desk for pick up. Understanding stated. Patient scheduled follow up appointment with Dr. Lake Bells at [facility] office, at [place] on 09/17/18.  Patient aware of office change change of address. Nothing further at this time.

## 2018-07-24 ENCOUNTER — Ambulatory Visit (INDEPENDENT_AMBULATORY_CARE_PROVIDER_SITE_OTHER): Payer: Medicare Other | Admitting: *Deleted

## 2018-07-24 DIAGNOSIS — J4540 Moderate persistent asthma, uncomplicated: Secondary | ICD-10-CM

## 2018-08-04 ENCOUNTER — Ambulatory Visit: Payer: Medicare HMO | Admitting: Allergy and Immunology

## 2018-08-13 DIAGNOSIS — M26629 Arthralgia of temporomandibular joint, unspecified side: Secondary | ICD-10-CM | POA: Insufficient documentation

## 2018-09-17 ENCOUNTER — Ambulatory Visit: Payer: Medicare HMO | Admitting: Pulmonary Disease

## 2018-09-17 ENCOUNTER — Encounter: Payer: Self-pay | Admitting: Pulmonary Disease

## 2018-09-17 ENCOUNTER — Ambulatory Visit: Payer: Medicare Other | Admitting: Pulmonary Disease

## 2018-09-17 VITALS — BP 104/60 | HR 69 | Ht 62.0 in | Wt 119.1 lb

## 2018-09-17 DIAGNOSIS — J328 Other chronic sinusitis: Secondary | ICD-10-CM

## 2018-09-17 DIAGNOSIS — J45991 Cough variant asthma: Secondary | ICD-10-CM | POA: Diagnosis not present

## 2018-09-17 DIAGNOSIS — R0600 Dyspnea, unspecified: Secondary | ICD-10-CM | POA: Diagnosis not present

## 2018-09-17 DIAGNOSIS — K219 Gastro-esophageal reflux disease without esophagitis: Secondary | ICD-10-CM

## 2018-09-17 DIAGNOSIS — J329 Chronic sinusitis, unspecified: Secondary | ICD-10-CM

## 2018-09-17 NOTE — Progress Notes (Signed)
Subjective:   PATIENT ID: Cassandra Sims GENDER: female DOB: 14-Jul-1933, MRN: [id number]  Synopsis: Former IT consultant Patient with Asthma, Left hemidiaphram paralysis, GERD, chronic rhinitis and a pulmonary nodule  HPI  Chief Complaint  Patient presents with  . Follow-up    Pt has increase dry cough, drainage, and SOB with exertion.   Nakiya says that she has been feeling significantly fatigued lately.  She just feels washed out all the time.  She thinks that her low blood pressure may have something to do with this.  She is interested in stopping some of her medicines.  She says that last week she was told that she has increasing eye pressures due to worsening glaucoma and she is now going to have to have a laser procedure to treat this.  Her ophthalmologist was concerned about her inhaler use.  She says that she uses Proventil less than once a month and she has not had a flareup of her asthma in over a year.  She continues to take [facility] 2 puffs twice a day.   Past Medical History:  Diagnosis Date  . Anxiety   . Arthritis   . Asthma   . Complication of anesthesia    hard to wake up, 2009  sit up in bed to fast and BP dropped low code blue called per pt.  . Family history of adverse reaction to anesthesia    sister has naseau  . GERD (gastroesophageal reflux disease)   . Glaucoma   . Headache    hx of migrains  . HOH (hard of hearing)   . Hypertension   . Hypoglycemia   . Multiple allergies   . Peripheral vascular disease (HCC)    varicose veins , Laser vein surgery  . PONV (postoperative nausea and vomiting)   . Pre-diabetes   . Prolonged QT interval 01/13/2018  . Rhinitis   . S/P knee replacement 01/13/2018  . Wears glasses      Review of Systems  Constitutional: Positive for malaise/fatigue. Negative for chills.  HENT: Positive for congestion and sinus pain.   Respiratory: Positive for cough and shortness of breath. Negative for sputum production and wheezing.     Cardiovascular: Positive for leg swelling. Negative for chest pain.  Neurological: Negative for weakness.      Objective:  Physical Exam   Vitals:   09/17/18 1357 09/17/18 1401  BP:  104/60  Pulse:  69  SpO2:  98%  Weight: 119 lb 1.9 oz (54 kg)   Height: 5\' 2"  (1.575 m)   RA  Gen: well appearing HENT: OP clear, TM's clear, neck supple PULM: CTA B, normal percussion CV: RRR, no mgr, trace edema GI: BS+, soft, nontender Derm: no cyanosis or rash Psyche: normal mood and affect\   CBC    Component Value Date/Time   WBC 6.4 03/20/2018 0853   RBC 4.19 03/20/2018 0853   HGB 12.1 03/20/2018 0853   HGB 13.6 08/26/2017 1108   HCT 38.1 03/20/2018 0853   HCT 40.8 08/26/2017 1108   PLT 273 03/20/2018 0853   PLT 217 08/26/2017 1108   MCV 90.9 03/20/2018 0853   MCV 92 08/26/2017 1108   MCH 28.9 03/20/2018 0853   MCHC 31.8 03/20/2018 0853   RDW 14.8 03/20/2018 0853   RDW 14.0 08/26/2017 1108   LYMPHSABS 1.6 08/26/2017 1108   MONOABS 1.1 (H) 03/29/2016 1329   EOSABS 0.4 08/26/2017 1108   BASOSABS 0.1 08/26/2017 1108    PFT  08/2017 [place]: FEV 1 1.28L 06/06/16: FVC 2.16 L (88%) FEV1 1.52 L (84%) FEV1/FVC 0.70 FEF 25-75 0.97 L (75%) negative bronchodilator response TLC 4.43 L (90%) RV 93% ERV 58% DLCO corrected 75% (Hgb 15.1)  08/04/12: FVC 2.81 L (110%) FEV1 1.87 L (107%) FEV1/FVC 0.67 FEF 25-75 0.16 L (57%) negative bronchodilator response TLC 4.33 L (95%) RV 79% ERV 87% DLCO uncorrected 93%  IMAGING CXR 08/2017 images independently reviewed showing an elevated left hemidiaphragm.  CT CHEST W/O 12/17/16: Left lower lobe 6 mm nodule unchanged compared to prior  CT CHEST W/ CONTRAST 06/19/16:  Mild centrilobular emphysema. 6 mm nodule along the minor fissure likely subpleural lymph node. Calcified lung nodules also noted. No pleural effusion or thickening. No pericardial effusion. No pathologic mediastinal adenopathy. Low attenuation lesions in the liver measuring up to 2.3  cm likely cysts.   BARIUM SWALLOW/ESOPHAGRAM 04/05/16 (per radiologist):  Moderate gastroesophageal reflux. No esophageal dysmotility, stricture or mass.  SNIFF TEST 04/05/16 (per radiologist):  Mild paradoxical movement of the diaphragms with forceful inhalation is consistent with paralysis of the LEFT hemidiaphragm. Chronic elevation of LEFT diaphragm.    LABS 05/27/17 ANA:  Negative   03/29/16 CBC:  8.6/13.8/40.6/232 Eos:  0.6 IgE:  14 RAST Panel:  Negative          Assessment & Plan:   Dyspnea, unspecified type  Cough variant asthma  Other chronic sinusitis  Gastroesophageal reflux disease, esophagitis presence not specified  Chronic sinusitis, unspecified location  Discussion: From an asthma standpoint it seems as if Ms. Geck has done quite well for the last year without exacerbations.  Unfortunately she has developed fairly serious glaucoma which is going to require surgery.  At this point I think what she should do is continue taking her Berna Bue as this seems to control her symptoms quite well and she should taper down to using Dulera on an as-needed basis as is now recommended by Barnett Applebaum guidelines for asthma.  This strategy should be effective because in some studies using IL-5 biologic agents for asthma patients were actually instructed to stop taking their inhaled controller medicines and did okay.  Plan: Severe persistent asthma with recurrent exacerbations: Keep taking Fasenra as directed by Dr. Neldon Mc For 5 days take [facility] 1 puff twice a day, if you feel okay after 5 days then moving forward stop taking Dulera and only use it as needed for shortness of breath.  Specifically what I mean is that you should not use it any more frequently than once every 12 hours, typically 2 puffs at a time. Continue Ventolin as needed for shortness of breath Practice good hand hygiene Stay active  Glaucoma: Stopping Dulera as detailed above.  Followup 6 months  > 50% of this  25 min vsiti spent face to face   Current Outpatient Medications:  .  acetaminophen (TYLENOL) 500 MG tablet, Take 1,000 mg by mouth every 8 (eight) hours as needed for mild pain., Disp: , Rfl:  .  albuterol (PROVENTIL HFA;VENTOLIN HFA) 108 (90 BASE) MCG/ACT inhaler, Inhale 2 puffs into the lungs every 4 (four) hours as needed for wheezing. Reported on 10/10/2015, Disp: , Rfl:  .  amLODipine (NORVASC) 5 MG tablet, Take 5 mg by mouth daily. , Disp: , Rfl:  .  Azelastine HCl 0.15 % SOLN, Place 1 spray into both nostrils 2 (two) times daily., Disp: 90 mL, Rfl: 0 .  Benralizumab (FASENRA) 30 MG/ML SOSY, Inject 30 mg into the skin every 8 (eight) weeks., Disp: ,  Rfl:  .  bimatoprost (LUMIGAN) 0.01 % SOLN, Place 1 drop into both eyes at bedtime. , Disp: , Rfl:  .  Calcium Carb-Cholecalciferol (CALTRATE 600+D3 PO), Take 1 tablet by mouth daily., Disp: , Rfl:  .  carboxymethylcellulose (REFRESH TEARS) 0.5 % SOLN, Place 1 drop into both eyes 5 (five) times daily as needed (for dry eyes)., Disp: , Rfl:  .  docusate sodium (COLACE) 100 MG capsule, Take 1 capsule (100 mg total) by mouth 2 (two) times daily., Disp: 10 capsule, Rfl: 0 .  fluticasone (FLONASE) 50 MCG/ACT nasal spray, Use one spray in each nostril twice daily as directed., Disp: 16 g, Rfl: 5 .  Lactobacillus (ACIDOPHILUS PO), Take 1 capsule by mouth 3 (three) times a week. , Disp: , Rfl:  .  Misc Natural Products (LUTEIN 20) CAPS, Take 20 mg by mouth daily with lunch. , Disp: , Rfl:  .  mometasone (ELOCON) 0.1 % ointment, Apply topically 2 (two) times daily., Disp: 45 g, Rfl: 3 .  mometasone-formoterol (DULERA) 100-5 MCG/ACT AERO, Take 2 puffs first thing in am and then another 2 puffs about 12 hours later. (Patient taking differently: Inhale 2 puffs into the lungs 2 (two) times daily. ), Disp: 1 Inhaler, Rfl: 1 .  mometasone-formoterol (DULERA) 100-5 MCG/ACT AERO, Inhale 2 puffs into the lungs 2 (two) times daily., Disp: 1 Inhaler, Rfl: 0 .   montelukast (SINGULAIR) 10 MG tablet, Take 10 mg by mouth at bedtime., Disp: , Rfl:  .  Multiple Vitamin (MULTIVITAMIN WITH MINERALS) TABS tablet, Take 1 tablet by mouth daily. Nature Made Multi Vitamin for Her 50+ W/No Iron, Disp: , Rfl:  .  pimecrolimus (ELIDEL) 1 % cream, Apply topically 1-2 times daily, Disp: 100 g, Rfl: 2 .  polyethylene glycol (MIRALAX / GLYCOLAX) packet, Take 17 g by mouth daily as needed for mild constipation., Disp: 14 each, Rfl: 0 .  psyllium (METAMUCIL) 58.6 % powder, Take 1 packet by mouth daily. 14.3 g (1 heaping tablespoon) every morning., Disp: , Rfl:  .  sodium phosphate (FLEET) 7-19 GM/118ML ENEM, Place 133 mLs (1 enema total) rectally once as needed for severe constipation., Disp: 2 Bottle, Rfl: 0 .  tacrolimus (PROTOPIC) 0.1 % ointment, Apply topically 2 (two) times daily. (Patient taking differently: Apply 1 application topically 2 (two) times daily as needed (for itching.). ), Disp: 60 g, Rfl: 5 .  telmisartan (MICARDIS) 80 MG tablet, Take 1 tablet (80 mg total) by mouth daily., Disp: 30 tablet, Rfl: 0 .  Turmeric 450 MG CAPS, Take 450 mg by mouth daily with lunch. , Disp: , Rfl:   Current Facility-Administered Medications:  .  Benralizumab SOSY 30 mg, 30 mg, Subcutaneous, Q8 Weeks, Kozlow, Donnamarie Poag, MD, 30 mg at 07/24/18 1612

## 2018-09-17 NOTE — Patient Instructions (Signed)
Plan: Severe persistent asthma with recurrent exacerbations: Keep taking Fasenra as directed by Dr. Neldon Mc For 5 days take [facility] 1 puff twice a day, if you feel okay after 5 days then moving forward stop taking Dulera and only use it as needed for shortness of breath.  Specifically what I mean is that you should not use it any more frequently than once every 12 hours, typically 2 puffs at a time. Continue Ventolin as needed for shortness of breath Practice good hand hygiene Stay active  Glaucoma: Stopping Dulera as detailed above.  Followup 6 months

## 2018-09-18 ENCOUNTER — Ambulatory Visit (INDEPENDENT_AMBULATORY_CARE_PROVIDER_SITE_OTHER): Payer: Medicare Other | Admitting: *Deleted

## 2018-09-18 DIAGNOSIS — J4540 Moderate persistent asthma, uncomplicated: Secondary | ICD-10-CM

## 2018-09-29 ENCOUNTER — Ambulatory Visit: Payer: Medicare HMO | Admitting: Allergy and Immunology

## 2018-10-05 ENCOUNTER — Telehealth: Payer: Self-pay | Admitting: Allergy and Immunology

## 2018-10-05 NOTE — Telephone Encounter (Signed)
Spoke to patient advised as written per Dr Neldon Mc and to get otc claritin due to insurance not paying for it. Patient verbalized understanding

## 2018-10-05 NOTE — Telephone Encounter (Signed)
Please inform patient that because of her sicca syndrome we need to be very careful about using oral antihistamines.  She can try loratadine 10 mg - 1/2 to 1 tablet 1 time per day.

## 2018-10-05 NOTE — Telephone Encounter (Signed)
Called patient states she is using Flonase,azelastine and montelukast but it is not helping allergies. Dr Neldon Mc please advise what else patient can take

## 2018-10-05 NOTE — Telephone Encounter (Signed)
Pt called and said that her allergy are started up and you had took her off of her meds. So she needs to know what to take. For her sneezing and watery eyes. [place] [phone].

## 2018-10-12 NOTE — Telephone Encounter (Signed)
Attempted to call back- line was busy

## 2018-10-12 NOTE — Telephone Encounter (Signed)
Patient started the 1/2 of a Claritin on 3/16. She states her nose is still running a lot.   Please Advise

## 2018-10-13 NOTE — Telephone Encounter (Signed)
Spoke with patient and informed her that if the 1/2 tablet of Claritin is not working that she could increase it to 1 tablet. Patient stated that the 1/2 tablet is working but if she feels it doesn't she will increase to 1 tablet. Patient also has a televisit on 10/20/2018 and she stated that if the Claritin is working she doesn't feel she will need that appt. Patient will call and let us know Friday 10/16/2018 or Monday 10/19/2018.

## 2018-10-20 ENCOUNTER — Ambulatory Visit: Payer: Medicare Other | Admitting: Allergy and Immunology

## 2018-10-20 ENCOUNTER — Other Ambulatory Visit: Payer: Self-pay

## 2018-10-20 ENCOUNTER — Ambulatory Visit (INDEPENDENT_AMBULATORY_CARE_PROVIDER_SITE_OTHER): Payer: Medicare Other | Admitting: Allergy and Immunology

## 2018-10-20 ENCOUNTER — Encounter: Payer: Self-pay | Admitting: Allergy and Immunology

## 2018-10-20 DIAGNOSIS — J4540 Moderate persistent asthma, uncomplicated: Secondary | ICD-10-CM | POA: Diagnosis not present

## 2018-10-20 DIAGNOSIS — J3089 Other allergic rhinitis: Secondary | ICD-10-CM

## 2018-10-20 DIAGNOSIS — D721 Eosinophilia, unspecified: Secondary | ICD-10-CM

## 2018-10-20 DIAGNOSIS — M3500 Sicca syndrome, unspecified: Secondary | ICD-10-CM | POA: Diagnosis not present

## 2018-10-20 DIAGNOSIS — L2089 Other atopic dermatitis: Secondary | ICD-10-CM

## 2018-10-20 DIAGNOSIS — L308 Other specified dermatitis: Secondary | ICD-10-CM

## 2018-10-20 DIAGNOSIS — L989 Disorder of the skin and subcutaneous tissue, unspecified: Secondary | ICD-10-CM

## 2018-10-20 MED ORDER — PREDNISONE 5 MG PO TABS: 5.0000 mg | ORAL_TABLET | Freq: Every day | ORAL | 0 refills | Status: DC

## 2018-10-20 NOTE — Progress Notes (Signed)
[place]   Follow-up Note  Referring Provider: Thomes Dinning, MD Primary Provider: Thomes Dinning, MD Date of Office Visit: 10/20/2018  Subjective:   Cassandra Sims (DOB: [birth year]) is a 85 y.o. female who returns to the [place] on 10/20/2018 in re-evaluation of the following:  HPI: This is a tele-med contact requested by Rosaleigh from her home.  She is followed in this clinic for asthma and allergic rhinitis and a history of atopic dermatitis and pruritic disorder and history of dry eye syndrome.  I last saw her in this clinic on 09 June 2018.  Since the spring, sneezing a lot and eyes watering and nasal congestion using kleenex. Tried Claritin helps some. Initialy ugly, now clear. Can smell but not really that good. No fever. No contact with sick individuals  Had [facility] removed secondary to glaucoma but lungs still good. Rare use of SABA.   Reflux and throat good with treatment.  Pruritis very good not requiring any topical therapy.  Allergies as of 10/20/2018      Reactions   Penicillins Shortness Of Breath, Swelling   Has patient had a PCN reaction causing immediate rash, facial/tongue/throat swelling, SOB or lightheadedness with hypotension: Yes Has patient had a PCN reaction causing severe rash involving mucus membranes or skin necrosis: No Has patient had a PCN reaction that required hospitalization: No Has patient had a PCN reaction occurring within the last 10 years: No If all of the above answers are "NO", then may proceed with Cephalosporin use.   Adhesive [tape] Itching   BAND-AID TAPE-skin redness/itching   Clindamycin/lincomycin Other (See Comments)   Severe joint pain.   Codeine Nausea And Vomiting   Darvocet [propoxyphene N-acetaminophen] Other (See Comments)   HTN   Dilaudid [hydromorphone Hcl] Nausea And Vomiting   DROP BLOOD PRESSURE HYPOTENSIVE   Flagyl [metronidazole] Other  (See Comments)   Joint pains   Lactose Intolerance (gi)    Levofloxacin    Joint pain   Macrodantin [nitrofurantoin Macrocrystal]    Morphine And Related    hallucinate   Premarin [conjugated Estrogens] Itching   Tramadol Hcl Other (See Comments)   Causes insomnia   Vancomycin Hives, Itching   Zantac [ranitidine Hcl]    Itching    Zofran [ondansetron Hcl]    HTN   Nitrofuran Derivatives Rash   Sulfa Antibiotics Rash      Medication List      acetaminophen 500 MG tablet Commonly known as:  TYLENOL Take 1,000 mg by mouth every 8 (eight) hours as needed for mild pain.   ACIDOPHILUS PO Take 1 capsule by mouth 3 (three) times a week.   albuterol 108 (90 Base) MCG/ACT inhaler Commonly known as:  PROVENTIL HFA;VENTOLIN HFA Inhale 2 puffs into the lungs every 4 (four) hours as needed for wheezing. Reported on 10/10/2015   amLODipine 5 MG tablet Commonly known as:  NORVASC Take 5 mg by mouth daily.   Azelastine HCl 0.15 % Soln Place 1 spray into both nostrils 2 (two) times daily.   bimatoprost 0.01 % Soln Commonly known as:  LUMIGAN Place 1 drop into both eyes at bedtime.   CALTRATE 600+D3 PO Take 1 tablet by mouth daily.   Fasenra 30 MG/ML Sosy Generic drug:  Benralizumab Inject 30 mg into the skin every 8 (eight) weeks.   fluticasone 50 MCG/ACT nasal spray Commonly known as:  FLONASE Use one spray in each nostril twice daily  as directed.   Lutein 20 Caps Take 20 mg by mouth daily with lunch.   mometasone 0.1 % ointment Commonly known as:  ELOCON Apply topically 2 (two) times daily.   mometasone-formoterol 100-5 MCG/ACT Aero Commonly known as:  DULERA Take 2 puffs first thing in am and then another 2 puffs about 12 hours later.   montelukast 10 MG tablet Commonly known as:  SINGULAIR Take 10 mg by mouth at bedtime.   multivitamin with minerals Tabs tablet Take 1 tablet by mouth daily. Nature Made Multi Vitamin for Her 50+ W/No Iron   psyllium 58.6 %  powder Commonly known as:  METAMUCIL Take 1 packet by mouth daily. 14.3 g (1 heaping tablespoon) every morning.   Refresh Tears 0.5 % Soln Generic drug:  carboxymethylcellulose Place 1 drop into both eyes 5 (five) times daily as needed (for dry eyes).   telmisartan 80 MG tablet Commonly known as:  Micardis Take 1 tablet (80 mg total) by mouth daily.   Turmeric 450 MG Caps Take 450 mg by mouth daily with lunch.       Past Medical History:  Diagnosis Date  . Anxiety   . Arthritis   . Asthma   . Complication of anesthesia    hard to wake up, 2009  sit up in bed to fast and BP dropped low code blue called per pt.  . Family history of adverse reaction to anesthesia    sister has naseau  . GERD (gastroesophageal reflux disease)   . Glaucoma   . Headache    hx of migrains  . HOH (hard of hearing)   . Hypertension   . Hypoglycemia   . Multiple allergies   . Peripheral vascular disease (HCC)    varicose veins , Laser vein surgery  . PONV (postoperative nausea and vomiting)   . Pre-diabetes   . Prolonged QT interval 01/13/2018  . Rhinitis   . S/P knee replacement 01/13/2018  . Wears glasses     Past Surgical History:  Procedure Laterality Date  . ABDOMINAL HYSTERECTOMY    . APPENDECTOMY    . arthroscopy right knee    . BREAST SURGERY     biopsy left   . COLECTOMY  2008   obstruction  . DIAGNOSTIC LAPAROSCOPY     exp  . DILATION AND CURETTAGE OF UTERUS    . EYE SURGERY     cataract   bil and retina surgery right eye, glaucoma surgery, right eye  . FUNCTIONAL ENDOSCOPIC SINUS SURGERY  2009   bilat ethm,frontal,max  . JOINT REPLACEMENT     right total knee arthroplasty 01-05-18 aluisio  . KNEE ARTHROSCOPY     right with synovectomy Dr. Wynelle Link 03-30-18   . KNEE ARTHROSCOPY Right 03/30/2018   Procedure: right knee scope; synovectomy;  Surgeon: Gaynelle Arabian, MD;  Location: [facility];  Service: Orthopedics;  Laterality: Right;  51min  . NASAL SINUS SURGERY Bilateral  10/26/2012   Procedure: BILATERAL ENDOSCOPIC REVISION ETHMOID MAXILLARY AND FRONTAL SINUS SURGERY  ;  Surgeon: Izora Gala, MD;  Location: [facility];  Service: ENT;  Laterality: Bilateral;  . OVARIAN CYST SURGERY    . TOTAL KNEE ARTHROPLASTY Right 01/05/2018   Procedure: RIGHT TOTAL KNEE ARTHROPLASTY;  Surgeon: Gaynelle Arabian, MD;  Location: [facility];  Service: Orthopedics;  Laterality: Right;  . TUBAL LIGATION    . URETER SURGERY     cut accidentally during exp lap-ovarien cyst  . VEIN LIGATION  Review of systems negative except as noted in HPI / PMHx or noted below:  Review of Systems  Constitutional: Negative.   HENT: Negative.   Eyes: Negative.   Respiratory: Negative.   Cardiovascular: Negative.   Gastrointestinal: Negative.   Genitourinary: Negative.   Musculoskeletal: Negative.   Skin: Negative.   Neurological: Negative.   Endo/Heme/Allergies: Negative.   Psychiatric/Behavioral: Negative.      Objective:   There were no vitals filed for this visit.        Physical Exam-deferred  Diagnostics: none  Assessment and Plan:   1. Asthma, moderate persistent, well-controlled   2. Other allergic rhinitis   3. Other atopic dermatitis   4. Sicca syndrome ([place])   5. Eosinophilia   6. Inflammatory dermatosis     1. For skin inflammation:   A. Elidel followed by mometasone 0.1% ointment 1-2 times per day  2. For nose issue:   A. Flonase 1 spray each nostril 2 time per day  B. montelukast 10 mg tablet 1 time per day  C. Azelastine 1 spray each nostril 2 times per day  3. For lung issue:   A. Fasenra every 8 weeks  4. For reflux issue:   A. Omeprazole 40mg  in AM  B. Famotidine 40mg  in PM   C. Minimize caffeine  5. If needed:   A. Proventil HFA (or similar) 2 inhalations two times per day  B. Nasal saline  C. Loratadine 10mg  - 1/2 to 1 tablet daily  6. For this recent episode:   A. Prednisone 10mg  - 1/2 tablet one time per day for 10  days  B. Lots of nasal saline  7. Return to clinic in 12 weeks or earlier if problem  Overall it appears that Cassandra Sims is doing quite well on her current therapy which includes a biological agent for her eosinophilic driven inflammatory disease and therapy directed against reflux and treatment for upper airway inflammation as noted above.  She has had a little bit more problems since the spring is arrived with rhinitis and she can increase her loratadine up to 10 mg daily and I have given her a very short low dose course of prednisone to use at 5 mg a day for the next 10 days.  I did inform her that even at a very low dose of prednisone it could affect her eye pressure and if she has any type of visual perversion she will need to discontinue this agent and see her ophthalmologist.  Assuming she does well I will see her back in this clinic in 12 weeks or earlier if there is a problem.  Total patient interaction time 20 minutes.  Allena Katz, MD Allergy / Immunology [place]

## 2018-10-20 NOTE — Patient Instructions (Addendum)
  1. For skin inflammation:   A. Elidel followed by mometasone 0.1% ointment 1-2 times per day  2. For nose issue:   A. Flonase 1 spray each nostril 2 time per day  B. montelukast 10 mg tablet 1 time per day  C. Azelastine 1 spray each nostril 2 times per day  3. For lung issue:   A. Fasenra every 8 weeks  4. For reflux issue:   A. Omeprazole 40mg  in AM  B. Famotidine 40mg  in PM   C. Minimize caffeine  5. If needed:   A. Proventil HFA (or similar) 2 inhalations two times per day  B. Nasal saline  C. Loratadine 10mg  - 1/2 to 1 tablet daily  6. For this recent episode:   A. Prednisone 10mg  - 1/2 tablet one time per day for 10 days  B. Lots of nasal saline  7. Return to clinic in 12 weeks or earlier if problem

## 2018-10-21 ENCOUNTER — Encounter: Payer: Self-pay | Admitting: Allergy and Immunology

## 2018-11-13 ENCOUNTER — Ambulatory Visit (INDEPENDENT_AMBULATORY_CARE_PROVIDER_SITE_OTHER): Payer: Medicare Other | Admitting: *Deleted

## 2018-11-13 ENCOUNTER — Other Ambulatory Visit: Payer: Self-pay

## 2018-11-13 DIAGNOSIS — J4540 Moderate persistent asthma, uncomplicated: Secondary | ICD-10-CM

## 2018-11-13 DIAGNOSIS — J4550 Severe persistent asthma, uncomplicated: Secondary | ICD-10-CM | POA: Diagnosis not present

## 2018-11-27 ENCOUNTER — Telehealth: Payer: Self-pay | Admitting: Allergy and Immunology

## 2018-11-27 NOTE — Telephone Encounter (Signed)
Patient is calling about an allergy flare Increased running nose, and allergy issues Can she add to her current plan of care to help her through this Please call

## 2018-11-27 NOTE — Telephone Encounter (Signed)
Spoke with patient for around 20 mins.  She has been taking her antihistamine in the morning when she wakes up.  Informed patient that pollen counts are typically highest first thing in the morning, and she may benefit from taking her antihistamine at night.  Informed patient that our first measure for allergy flares is usually to take an extra dose of antihistamines.  Informed patient to take 1/2 tablet of claritin this morning and another 10mg  tablet this evening.  Tomorrow evening, she is going to take 1 1/2 tablets of claritin.  She will call if she is still having issues or with other questions.

## 2018-12-11 ENCOUNTER — Other Ambulatory Visit: Payer: Self-pay | Admitting: *Deleted

## 2018-12-11 MED ORDER — FEXOFENADINE HCL 180 MG PO TABS: 180.0000 mg | ORAL_TABLET | Freq: Every day | ORAL | 1 refills | Status: DC

## 2018-12-11 NOTE — Telephone Encounter (Signed)
Patient called and stated that the Claritin is not helping with her allergy symptoms. I asked patient if she had tried and Zyrtec and she said it gives her a headache. I asked about Allegra and she stated she had not. Informed patient to try Allegra over the weekend and call us back Monday with an update.

## 2018-12-24 DIAGNOSIS — F5104 Psychophysiologic insomnia: Secondary | ICD-10-CM | POA: Insufficient documentation

## 2019-01-02 ENCOUNTER — Other Ambulatory Visit: Payer: Self-pay | Admitting: Allergy and Immunology

## 2019-01-04 ENCOUNTER — Other Ambulatory Visit: Payer: Self-pay | Admitting: Allergy and Immunology

## 2019-01-08 ENCOUNTER — Ambulatory Visit (INDEPENDENT_AMBULATORY_CARE_PROVIDER_SITE_OTHER): Payer: Medicare Other | Admitting: *Deleted

## 2019-01-08 DIAGNOSIS — J4550 Severe persistent asthma, uncomplicated: Secondary | ICD-10-CM

## 2019-01-27 DIAGNOSIS — M12812 Other specific arthropathies, not elsewhere classified, left shoulder: Secondary | ICD-10-CM | POA: Insufficient documentation

## 2019-01-27 DIAGNOSIS — F419 Anxiety disorder, unspecified: Secondary | ICD-10-CM | POA: Insufficient documentation

## 2019-03-05 ENCOUNTER — Other Ambulatory Visit: Payer: Self-pay

## 2019-03-05 ENCOUNTER — Ambulatory Visit (INDEPENDENT_AMBULATORY_CARE_PROVIDER_SITE_OTHER): Payer: Medicare Other | Admitting: *Deleted

## 2019-03-05 DIAGNOSIS — J4540 Moderate persistent asthma, uncomplicated: Secondary | ICD-10-CM

## 2019-04-02 ENCOUNTER — Telehealth: Payer: Self-pay

## 2019-04-02 NOTE — Telephone Encounter (Signed)
Most people tolerate it fine without issues. There are some patients that can have increased nightmares from the drug and now there is a black box warning for increase in depression and suicidal ideations.   If she is tolerating it fine without those side effects, she should continue to take it. If she does not think that the addition of Singulair even helped her symptoms, then by all means, she should stop it.   Salvatore Marvel, MD Allergy and [place] [facility] [place]

## 2019-04-02 NOTE — Telephone Encounter (Signed)
Patient states she heard something on the news about not taking Singulair and wants a update on what to do.  Thanks

## 2019-04-02 NOTE — Telephone Encounter (Signed)
Dr. Ernst Bowler please advise since Dr. Neldon Mc is out of office.

## 2019-04-06 NOTE — Telephone Encounter (Signed)
Patient called today and states she believes the Singualr is causing her to have bad dreams and insomnia. I told patient to hold Singulair for now. Patient also states she is having increased allergy symptoms but states she is only take 5 mg of Claritin. I informed patient to increase to 10 mg of Claritin. She states she is unable to tolerate and of the other antihistamines. Please advise.

## 2019-04-06 NOTE — Telephone Encounter (Signed)
Informed patient of instructions and she agreed with plan.

## 2019-04-06 NOTE — Telephone Encounter (Signed)
Please inform patient that if she thinks she is having side effects from Singulair she should discontinue this medication.  She can try Claritin 5 mg twice a day instead of whole 10 mg tablet once a day.

## 2019-04-30 ENCOUNTER — Ambulatory Visit (INDEPENDENT_AMBULATORY_CARE_PROVIDER_SITE_OTHER): Payer: Medicare Other | Admitting: *Deleted

## 2019-04-30 ENCOUNTER — Other Ambulatory Visit: Payer: Self-pay

## 2019-04-30 DIAGNOSIS — J4550 Severe persistent asthma, uncomplicated: Secondary | ICD-10-CM

## 2019-04-30 DIAGNOSIS — J4540 Moderate persistent asthma, uncomplicated: Secondary | ICD-10-CM

## 2019-05-10 ENCOUNTER — Other Ambulatory Visit: Payer: Self-pay | Admitting: *Deleted

## 2019-05-10 MED ORDER — AZELASTINE HCL 0.15 % NA SOLN: 1.0000 | Freq: Two times a day (BID) | NASAL | 1 refills | Status: DC

## 2019-05-15 ENCOUNTER — Other Ambulatory Visit: Payer: Self-pay | Admitting: Allergy

## 2019-05-15 NOTE — Progress Notes (Signed)
She reports she has allergies real bad.  Symptoms have been worse she feels with the change in the weather.  She denies fever.  She states she has been having sneezing bouts and waking up very hoarse in the mornings.  She is taking half pill claritin in AM and in PM.  She states she was told to stop taking singulair due to "information that came out in the news".  She is taking her routine medications as directed.  She has a question of if she can take prednisone that she had at home from this summer that she did not take at that time.  She has prednisone 20mg  tablets enough for 5 days.   I advised she could take it get allergy symptom relief can could stop taking after 2-3 days if feeling improved.

## 2019-06-11 DIAGNOSIS — H9312 Tinnitus, left ear: Secondary | ICD-10-CM | POA: Insufficient documentation

## 2019-06-11 DIAGNOSIS — H9113 Presbycusis, bilateral: Secondary | ICD-10-CM | POA: Insufficient documentation

## 2019-06-13 ENCOUNTER — Emergency Department (HOSPITAL_BASED_OUTPATIENT_CLINIC_OR_DEPARTMENT_OTHER)
Admission: EM | Admit: 2019-06-13 | Discharge: 2019-06-14 | Disposition: A | Discharge status: Home / Self Care | Payer: Medicare Other | Attending: Emergency Medicine | Admitting: Emergency Medicine

## 2019-06-13 ENCOUNTER — Emergency Department (HOSPITAL_BASED_OUTPATIENT_CLINIC_OR_DEPARTMENT_OTHER): Payer: Medicare Other

## 2019-06-13 ENCOUNTER — Encounter (HOSPITAL_BASED_OUTPATIENT_CLINIC_OR_DEPARTMENT_OTHER): Payer: Self-pay

## 2019-06-13 ENCOUNTER — Other Ambulatory Visit: Payer: Self-pay

## 2019-06-13 DIAGNOSIS — Z79899 Other long term (current) drug therapy: Secondary | ICD-10-CM | POA: Diagnosis not present

## 2019-06-13 DIAGNOSIS — H538 Other visual disturbances: Secondary | ICD-10-CM | POA: Diagnosis not present

## 2019-06-13 DIAGNOSIS — I725 Aneurysm of other precerebral arteries: Secondary | ICD-10-CM | POA: Insufficient documentation

## 2019-06-13 DIAGNOSIS — R519 Headache, unspecified: Secondary | ICD-10-CM | POA: Diagnosis not present

## 2019-06-13 DIAGNOSIS — I10 Essential (primary) hypertension: Secondary | ICD-10-CM | POA: Diagnosis present

## 2019-06-13 DIAGNOSIS — J45909 Unspecified asthma, uncomplicated: Secondary | ICD-10-CM | POA: Insufficient documentation

## 2019-06-13 MED ORDER — HYDRALAZINE HCL 20 MG/ML IJ SOLN
10.0000 mg | Freq: Once | INTRAMUSCULAR | Status: AC
  Administered 2019-06-14: 10 mg via INTRAVENOUS
  Filled 2019-06-13: qty 1

## 2019-06-13 NOTE — ED Triage Notes (Signed)
Pt presents with hypertension, dizziness, blurred vision and HA x 2 weeks. Pt has been seen by primary doc and had medications adjusted. Symptoms have worsened x 4hrs.

## 2019-06-14 ENCOUNTER — Other Ambulatory Visit: Payer: Self-pay

## 2019-06-14 ENCOUNTER — Emergency Department (HOSPITAL_BASED_OUTPATIENT_CLINIC_OR_DEPARTMENT_OTHER)
Admission: EM | Admit: 2019-06-14 | Discharge: 2019-06-14 | Disposition: A | Discharge status: Home / Self Care | Payer: Medicare Other | Source: Home / Self Care | Attending: Emergency Medicine | Admitting: Emergency Medicine

## 2019-06-14 ENCOUNTER — Encounter (HOSPITAL_BASED_OUTPATIENT_CLINIC_OR_DEPARTMENT_OTHER): Payer: Self-pay

## 2019-06-14 ENCOUNTER — Emergency Department (HOSPITAL_BASED_OUTPATIENT_CLINIC_OR_DEPARTMENT_OTHER): Payer: Medicare Other

## 2019-06-14 DIAGNOSIS — R519 Headache, unspecified: Secondary | ICD-10-CM

## 2019-06-14 DIAGNOSIS — I725 Aneurysm of other precerebral arteries: Secondary | ICD-10-CM | POA: Insufficient documentation

## 2019-06-14 DIAGNOSIS — J45909 Unspecified asthma, uncomplicated: Secondary | ICD-10-CM | POA: Insufficient documentation

## 2019-06-14 DIAGNOSIS — R030 Elevated blood-pressure reading, without diagnosis of hypertension: Secondary | ICD-10-CM

## 2019-06-14 DIAGNOSIS — Z79899 Other long term (current) drug therapy: Secondary | ICD-10-CM | POA: Insufficient documentation

## 2019-06-14 DIAGNOSIS — Z96651 Presence of right artificial knee joint: Secondary | ICD-10-CM | POA: Insufficient documentation

## 2019-06-14 LAB — BASIC METABOLIC PANEL
Anion gap: 8 (ref 5–15)
BUN: 20 mg/dL (ref 8–23)
CO2: 25 mmol/L (ref 22–32)
Calcium: 9.1 mg/dL (ref 8.9–10.3)
Chloride: 105 mmol/L (ref 98–111)
Creatinine, Ser: 0.72 mg/dL (ref 0.44–1.00)
GFR calc Af Amer: >60 mL/min (ref >60)
GFR calc non Af Amer: >60 mL/min (ref >60)
Glucose, Bld: 117 mg/dL — ABNORMAL HIGH (ref 70–99)
Potassium: 3.7 mmol/L (ref 3.5–5.1)
Sodium: 138 mmol/L (ref 135–145)

## 2019-06-14 LAB — CBC WITH DIFFERENTIAL/PLATELET
Abs Immature Granulocytes: 0.01 10*3/uL (ref 0.00–0.07)
Basophils Absolute: 0 10*3/uL (ref 0.0–0.1)
Basophils Relative: 1 %
Eosinophils Absolute: 0 10*3/uL (ref 0.0–0.5)
Eosinophils Relative: 0 %
HCT: 41.7 % (ref 36.0–46.0)
Hemoglobin: 13.5 g/dL (ref 12.0–15.0)
Immature Granulocytes: 0 %
Lymphocytes Relative: 28 %
Lymphs Abs: 1.1 10*3/uL (ref 0.7–4.0)
MCH: 30.5 pg (ref 26.0–34.0)
MCHC: 32.4 g/dL (ref 30.0–36.0)
MCV: 94.3 fL (ref 80.0–100.0)
Monocytes Absolute: 0.7 10*3/uL (ref 0.1–1.0)
Monocytes Relative: 17 %
Neutro Abs: 2.2 10*3/uL (ref 1.7–7.7)
Neutrophils Relative %: 54 %
Platelets: 191 10*3/uL (ref 150–400)
RBC: 4.42 MIL/uL (ref 3.87–5.11)
RDW: 14.2 % (ref 11.5–15.5)
WBC: 4.1 10*3/uL (ref 4.0–10.5)
nRBC: 0 % (ref 0.0–0.2)

## 2019-06-14 LAB — CSF CELL COUNT WITH DIFFERENTIAL
RBC Count, CSF: 0 /mm3
RBC Count, CSF: 2 /mm3 — ABNORMAL HIGH
Tube #: 1
Tube #: 4
WBC, CSF: 1 /mm3 (ref 0–5)
WBC, CSF: 1 /mm3 (ref 0–5)

## 2019-06-14 LAB — PROTEIN, CSF: Total  Protein, CSF: 45 mg/dL (ref 15–45)

## 2019-06-14 LAB — GLUCOSE, CSF: Glucose, CSF: 65 mg/dL (ref 40–70)

## 2019-06-14 MED ORDER — IOHEXOL 350 MG/ML SOLN
75.0000 mL | Freq: Once | INTRAVENOUS | Status: AC | PRN
  Administered 2019-06-14: 75 mL via INTRAVENOUS

## 2019-06-14 MED ORDER — HYDRALAZINE HCL 25 MG PO TABS
25.0000 mg | ORAL_TABLET | Freq: Once | ORAL | Status: AC
  Administered 2019-06-14: 25 mg via ORAL
  Filled 2019-06-14: qty 1

## 2019-06-14 NOTE — ED Notes (Signed)
ED Provider at bedside. 

## 2019-06-14 NOTE — ED Triage Notes (Addendum)
Pt states her BP is elevated and she has "slight headache"-HA 3/10-was 7/10 prior to tylenol-states she was seen here last night for same and notified she has an aneurysm-states she was advised by PCP to come back to ED-NAD-steady gait

## 2019-06-14 NOTE — ED Provider Notes (Signed)
[facility] EMERGENCY DEPARTMENT Provider Note   CSN: [id number] Arrival date & time: 06/14/19  1739     History   Chief Complaint Chief Complaint  Patient presents with   Hypertension    HPI Cassandra Sims is a 85 y.o. female.     The history is provided by the patient, a relative and medical records. No language interpreter was used.  Hypertension This is a chronic problem. The problem occurs constantly. The problem has not changed since onset.Associated symptoms include headaches. Pertinent negatives include no chest pain, no abdominal pain and no shortness of breath. Nothing aggravates the symptoms. Nothing relieves the symptoms. She has tried nothing for the symptoms. The treatment provided no relief.    Past Medical History:  Diagnosis Date   Anxiety    Arthritis    Asthma    Complication of anesthesia    hard to wake up, 2009  sit up in bed to fast and BP dropped low code blue called per pt.   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 03/30/2018   Hyponatremia 01/13/2018   Prolonged QT interval 01/13/2018   S/P knee replacement 01/13/2018   Hypokalemia    OA (osteoarthritis) of knee 01/05/2018   GERD (gastroesophageal reflux disease) 07/03/2016   Hemidiaphragm paralysis 07/03/2016   Nodule of right lung 06/19/2016   HTN (hypertension) 03/29/2016   Glaucoma 03/29/2016    Arthritis 03/29/2016   Sinusitis, chronic 11/25/2012   Cough variant asthma 11/25/2012   Cough 11/24/2012    Past Surgical History:  Procedure Laterality Date   ABDOMINAL HYSTERECTOMY     APPENDECTOMY     arthroscopy right knee     BREAST SURGERY     biopsy left    COLECTOMY  2008   obstruction   DIAGNOSTIC LAPAROSCOPY     exp   DILATION AND CURETTAGE OF UTERUS     EYE SURGERY     cataract   bil and retina surgery right eye, glaucoma surgery, right eye   FUNCTIONAL ENDOSCOPIC SINUS SURGERY  2009   bilat ethm,frontal,max   JOINT REPLACEMENT     right total knee arthroplasty 01-05-18 aluisio   KNEE ARTHROSCOPY     right with synovectomy Dr. Wynelle Link 03-30-18    KNEE ARTHROSCOPY Right 03/30/2018   Procedure: right knee scope; synovectomy;  Surgeon: Gaynelle Arabian, MD;  Location: [facility];  Service: Orthopedics;  Laterality: Right;  11min   NASAL SINUS SURGERY Bilateral 10/26/2012   Procedure: BILATERAL ENDOSCOPIC REVISION ETHMOID MAXILLARY AND FRONTAL SINUS SURGERY  ;  Surgeon: Izora Gala, MD;  Location: [facility];  Service: ENT;  Laterality: Bilateral;   OVARIAN CYST SURGERY     TOTAL KNEE ARTHROPLASTY Right 01/05/2018   Procedure: RIGHT TOTAL KNEE ARTHROPLASTY;  Surgeon: Gaynelle Arabian, MD;  Location: [facility];  Service: Orthopedics;  Laterality: Right;   TUBAL LIGATION     URETER SURGERY     cut accidentally during exp lap-ovarien cyst   VEIN LIGATION       OB History   No obstetric history on file.      Home Medications    Prior to Admission medications   Medication Sig Start Date End Date Taking? Authorizing Provider  acetaminophen (TYLENOL) 500 MG tablet Take 1,000 mg by mouth every 8 (eight) hours as needed for mild pain.    [provider]  albuterol (PROVENTIL HFA;VENTOLIN HFA) 108 (90 BASE) MCG/ACT inhaler Inhale 2 puffs into the lungs every 4 (four) hours as needed for wheezing. Reported on 10/10/2015    [provider]  amLODipine (NORVASC) 5 MG tablet Take 5 mg by mouth daily.  01/29/17   [provider]  Azelastine HCl 0.15 % SOLN Place 1 spray into both nostrils 2 (two) times daily. 05/10/19   Kozlow, Donnamarie Poag, MD  Benralizumab (FASENRA) 30 MG/ML SOSY Inject 30 mg into the skin every 8 (eight) weeks.    [provider]  Calcium Carb-Cholecalciferol (CALTRATE 600+D3 PO) Take 1 tablet by mouth daily.    [provider]  carboxymethylcellulose (REFRESH TEARS) 0.5 % SOLN Place 1 drop into both eyes 5 (five) times daily as needed (for dry eyes).    [provider]  DULoxetine (CYMBALTA) 20 MG capsule Take 20 mg by mouth once. 06/07/19   [provider]  fexofenadine (ALLEGRA) 180 MG tablet TAKE 1 TABLET BY MOUTH EVERY DAY 01/04/19   Kozlow, Donnamarie Poag, MD  fluticasone (FLONASE) 50 MCG/ACT nasal spray Use one spray in each nostril twice daily as directed. 06/16/18   Kozlow, Donnamarie Poag, MD  hydrALAZINE (APRESOLINE) 10 MG tablet Take 10 mg by mouth once. 06/11/19   [provider]  Lactobacillus (ACIDOPHILUS PO) Take 1 capsule by mouth 3 (three) times a week.     [provider]  Misc Natural Products (LUTEIN 20) CAPS Take 20 mg by mouth daily with lunch.     [provider]  mometasone (ELOCON) 0.1 % ointment Apply topically 2 (two) times daily. 06/10/18   Kozlow, Donnamarie Poag, MD  mometasone-formoterol (DULERA) 100-5 MCG/ACT AERO Take 2 puffs first thing in am and then another 2 puffs about 12 hours later. Patient taking differently: Inhale 2 puffs into the lungs 2 (two) times daily as needed. Take 2 puffs first thing in am and then another 2 puffs about 12 hours later. 03/17/18   Kozlow, Donnamarie Poag, MD  montelukast (SINGULAIR) 10 MG tablet Take 10 mg by mouth at bedtime.    [provider]  Multiple Vitamin (MULTIVITAMIN WITH MINERALS) TABS tablet Take 1 tablet by mouth daily. Nature Made Multi Vitamin for Her 50+ W/No Iron    [provider]    predniSONE (DELTASONE) 5 MG tablet Take 1 tablet (5 mg total) by mouth daily with breakfast. 10/20/18   Kozlow, Donnamarie Poag, MD  psyllium (METAMUCIL) 58.6 % powder Take 1 packet by mouth daily. 14.3 g (1 heaping tablespoon) every morning.    [provider]  telmisartan (MICARDIS) 80 MG tablet Take 1 tablet (80 mg total) by mouth daily. 01/16/18   Eugenie Filler, MD  telmisartan (MICARDIS) 80 MG tablet Take 80 mg by mouth once. 06/08/19   [provider]  Turmeric 450 MG CAPS Take 450 mg by mouth daily with lunch.     [provider]  Family History Family History  Problem Relation Age of Onset   Glaucoma Mother    Arthritis Mother    Lung disease Sister    Breast cancer Daughter    Diabetes Mellitus I Other     Social History Social History   Tobacco Use   Smoking status: Never Smoker   Smokeless tobacco: Never Used  Substance Use Topics   Alcohol use: No   Drug use: No     Allergies   Penicillins, Adhesive [tape], Clindamycin/lincomycin, Codeine, Darvocet [propoxyphene n-acetaminophen], Dilaudid [hydromorphone hcl], Flagyl [metronidazole], Lactose intolerance (gi), Levofloxacin, Macrodantin [nitrofurantoin macrocrystal], Morphine and related, Premarin [conjugated estrogens], Tramadol hcl, Vancomycin, Zantac [ranitidine hcl], Zofran [ondansetron hcl], Nitrofuran derivatives, and Sulfa antibiotics   Review of Systems Review of Systems  Constitutional: Negative for chills, diaphoresis, fatigue and fever.  Eyes: Negative for photophobia and visual disturbance.  Respiratory: Negative for chest tightness, shortness of breath and wheezing.   Cardiovascular: Negative for chest pain and palpitations.  Gastrointestinal: Negative for abdominal pain, constipation, diarrhea, nausea and vomiting.  Genitourinary: Negative for dysuria.  Musculoskeletal: Negative for back pain, neck pain and neck stiffness.  Neurological: Positive for light-headedness  and headaches. Negative for dizziness, facial asymmetry, weakness and numbness.  Hematological: Negative for adenopathy.  Psychiatric/Behavioral: Negative for agitation.  All other systems reviewed and are negative.    Physical Exam Updated Vital Signs BP (!) 186/88    Pulse 71    Temp 98 F (36.7 C) (Oral)    Resp 18    SpO2 97%   Physical Exam Vitals signs and nursing note reviewed.  Constitutional:      General: She is not in acute distress.    Appearance: Normal appearance. She is well-developed. She is not ill-appearing, toxic-appearing or diaphoretic.  HENT:     Head: Normocephalic and atraumatic.     Right Ear: External ear normal.     Left Ear: External ear normal.     Nose: Nose normal. No congestion or rhinorrhea.     Mouth/Throat:     Mouth: Mucous membranes are moist.     Pharynx: No oropharyngeal exudate or posterior oropharyngeal erythema.  Eyes:     Extraocular Movements: Extraocular movements intact.     Conjunctiva/sclera: Conjunctivae normal.     Pupils: Pupils are equal, round, and reactive to light.  Neck:     Musculoskeletal: Normal range of motion and neck supple. No muscular tenderness.  Cardiovascular:     Rate and Rhythm: Normal rate and regular rhythm.     Pulses: Normal pulses.     Heart sounds: No murmur.  Pulmonary:     Effort: Pulmonary effort is normal. No respiratory distress.     Breath sounds: No stridor. No wheezing, rhonchi or rales.  Chest:     Chest wall: No tenderness.  Abdominal:     General: Abdomen is flat. There is no distension.     Tenderness: There is no abdominal tenderness. There is no rebound.  Musculoskeletal:        General: No tenderness.  Skin:    General: Skin is warm.     Capillary Refill: Capillary refill takes less than 2 seconds.     Findings: No erythema or rash.  Neurological:     General: No focal deficit present.     Mental Status: She is alert and oriented to person, place, and time.     Cranial Nerves:  No cranial nerve deficit.     Sensory: No sensory deficit.  Motor: No weakness or abnormal muscle tone.     Coordination: Coordination normal.     Deep Tendon Reflexes: Reflexes are normal and symmetric.  Psychiatric:        Mood and Affect: Mood normal.      ED Treatments / Results  Labs (all labs ordered are listed, but only abnormal results are displayed) Labs Reviewed - No data to display  EKG None  Radiology Ct Angio Head W Or Wo Contrast  Result Date: 06/14/2019 CLINICAL DATA:  Basilar are aneurysm. Dizziness and hypertension. EXAM: CT ANGIOGRAPHY HEAD AND NECK TECHNIQUE: Multidetector CT imaging of the head and neck was performed using the standard protocol during bolus administration of intravenous contrast. Multiplanar CT image reconstructions and MIPs were obtained to evaluate the vascular anatomy. Carotid stenosis measurements (when applicable) are obtained utilizing NASCET criteria, using the distal internal carotid diameter as the denominator. CONTRAST:  80mL OMNIPAQUE IOHEXOL 350 MG/ML SOLN COMPARISON:  None. FINDINGS: CTA NECK FINDINGS SKELETON: There is no bony spinal canal stenosis. No lytic or blastic lesion. OTHER NECK: Normal pharynx, larynx and major salivary glands. No cervical lymphadenopathy. Unremarkable thyroid gland. UPPER CHEST: Incompletely visualized opacity of the left lower lobe, possibly atelectasis or consolidation. AORTIC ARCH: There is mild calcific atherosclerosis of the aortic arch. There is no aneurysm, dissection or hemodynamically significant stenosis of the visualized portion of the aorta. Normal variant aortic arch branching pattern with the brachiocephalic and left common carotid arteries sharing a common origin. The visualized proximal subclavian arteries are widely patent. RIGHT CAROTID SYSTEM: Normal without aneurysm, dissection or stenosis. LEFT CAROTID SYSTEM: Normal without aneurysm, dissection or stenosis. VERTEBRAL ARTERIES: Right dominant  configuration. Both origins are clearly patent. There is no dissection, occlusion or flow-limiting stenosis to the skull base (V1-V3 segments). CTA HEAD FINDINGS POSTERIOR CIRCULATION: --Vertebral arteries: Normal V4 segments. --Posterior inferior cerebellar arteries (PICA): Patent origins from the vertebral arteries. --Anterior inferior cerebellar arteries (AICA): Patent origins from the basilar artery. --Basilar artery: There is a leftward projecting aneurysm of the basilar tip measuring 0.9 cm AP x 0.9 cm TV x 0.6 cm CC . --Superior cerebellar arteries: Normal. --Posterior cerebral arteries: Normal. Both originate from the basilar artery. Posterior communicating arteries (p-comm) are diminutive or absent. ANTERIOR CIRCULATION: --Intracranial internal carotid arteries: Normal. --Anterior cerebral arteries (ACA): Normal. Both A1 segments are present. Patent anterior communicating artery (a-comm). --Middle cerebral arteries (MCA): Normal. VENOUS SINUSES: As permitted by contrast timing, patent. ANATOMIC VARIANTS: None Review of the MIP images confirms the above findings. IMPRESSION: 1. 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 Non-emergent Neuro-Interventional Radiology or Neurosurgery referral is suggested to evaluate the appropriateness of potential treatment. 2. No intracranial arterial occlusion or high-grade stenosis. 3. Incompletely visualized opacity of the left lower lobe, possibly atelectasis or consolidation. 4. Aortic Atherosclerosis (ICD10-I70.0). Electronically Signed   By: Ulyses Jarred M.D.   On: 06/14/2019 01:57   Ct Head Wo Contrast  Result Date: 06/14/2019 CLINICAL DATA:  Blurry vision and dizziness with headaches EXAM: CT HEAD WITHOUT CONTRAST TECHNIQUE: Contiguous axial images were obtained from the base of the skull through the vertex without intravenous contrast. COMPARISON:  06/19/2016 FINDINGS: Brain: No findings to suggest acute hemorrhage, acute infarction or  space-occupying mass lesion are seen. Changes consistent with mild atrophy and chronic white matter ischemic changes are seen. Vascular: There is focal aneurysmal dilatation of the mid basilar artery to 1 cm. Skull: Normal. Negative for fracture or focal lesion. Sinuses/Orbits: Postsurgical changes in  the right orbit. Mucosal thickening within the maxillary antra is noted bilaterally left greater than right. Other: None IMPRESSION: No acute intracranial abnormality is noted. Chronic white matter ischemic changes and mild atrophic changes are seen. Focal aneurysmal dilatation of the mid basilar artery of uncertain chronicity. This was not well visualized on the prior CT of the neck from 06/19/2016. Nonemergent workup is recommended. Electronically Signed   By: Inez Catalina M.D.   On: 06/14/2019 00:57   Ct Angio Neck W And/or Wo Contrast  Result Date: 06/14/2019 CLINICAL DATA:  Basilar are aneurysm. Dizziness and hypertension. EXAM: CT ANGIOGRAPHY HEAD AND NECK TECHNIQUE: Multidetector CT imaging of the head and neck was performed using the standard protocol during bolus administration of intravenous contrast. Multiplanar CT image reconstructions and MIPs were obtained to evaluate the vascular anatomy. Carotid stenosis measurements (when applicable) are obtained utilizing NASCET criteria, using the distal internal carotid diameter as the denominator. CONTRAST:  54mL OMNIPAQUE IOHEXOL 350 MG/ML SOLN COMPARISON:  None. FINDINGS: CTA NECK FINDINGS SKELETON: There is no bony spinal canal stenosis. No lytic or blastic lesion. OTHER NECK: Normal pharynx, larynx and major salivary glands. No cervical lymphadenopathy. Unremarkable thyroid gland. UPPER CHEST: Incompletely visualized opacity of the left lower lobe, possibly atelectasis or consolidation. AORTIC ARCH: There is mild calcific atherosclerosis of the aortic arch. There is no aneurysm, dissection or hemodynamically significant stenosis of the visualized portion  of the aorta. Normal variant aortic arch branching pattern with the brachiocephalic and left common carotid arteries sharing a common origin. The visualized proximal subclavian arteries are widely patent. RIGHT CAROTID SYSTEM: Normal without aneurysm, dissection or stenosis. LEFT CAROTID SYSTEM: Normal without aneurysm, dissection or stenosis. VERTEBRAL ARTERIES: Right dominant configuration. Both origins are clearly patent. There is no dissection, occlusion or flow-limiting stenosis to the skull base (V1-V3 segments). CTA HEAD FINDINGS POSTERIOR CIRCULATION: --Vertebral arteries: Normal V4 segments. --Posterior inferior cerebellar arteries (PICA): Patent origins from the vertebral arteries. --Anterior inferior cerebellar arteries (AICA): Patent origins from the basilar artery. --Basilar artery: There is a leftward projecting aneurysm of the basilar tip measuring 0.9 cm AP x 0.9 cm TV x 0.6 cm CC . --Superior cerebellar arteries: Normal. --Posterior cerebral arteries: Normal. Both originate from the basilar artery. Posterior communicating arteries (p-comm) are diminutive or absent. ANTERIOR CIRCULATION: --Intracranial internal carotid arteries: Normal. --Anterior cerebral arteries (ACA): Normal. Both A1 segments are present. Patent anterior communicating artery (a-comm). --Middle cerebral arteries (MCA): Normal. VENOUS SINUSES: As permitted by contrast timing, patent. ANATOMIC VARIANTS: None Review of the MIP images confirms the above findings. IMPRESSION: 1. 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 Non-emergent Neuro-Interventional Radiology or Neurosurgery referral is suggested to evaluate the appropriateness of potential treatment. 2. No intracranial arterial occlusion or high-grade stenosis. 3. Incompletely visualized opacity of the left lower lobe, possibly atelectasis or consolidation. 4. Aortic Atherosclerosis (ICD10-I70.0). Electronically Signed   By: Ulyses Jarred M.D.   On: 06/14/2019 01:57      Procedures Procedures (including critical care time)  Medications Ordered in ED Medications  hydrALAZINE (APRESOLINE) tablet 25 mg (25 mg Oral Given 06/14/19 1913)     Initial Impression / Assessment and Plan / ED Course  I have reviewed the triage vital signs and the nursing notes.  Pertinent labs & imaging results that were available during my care of the patient were reviewed by me and considered in my medical decision making (see chart for details).        Cassandra Sims  is a 85 y.o. female with a past medical history significant for hypertension, GERD, and recent diagnosis of basilar aneurysm who presents with fatigue, and headache with elevated blood pressures.  Patient reports that she was in the emergency department all night last night where she had CTA revealing a basilar aneurysm and lumbar puncture which did not show evidence of any hemorrhage.  Patient followed up with her PCP today who increased her blood pressure medication.  She reports she did not yet take the increased blood pressure medication of hydralazine before calling in saying that she was still having some headaches and elevated blood pressures at home.  They told her to come back to emergency department and not take any medication.  She reports that she is having no worsening symptoms from last night including no new neurologic deficits such as nausea, vomiting, vision changes, coordination, or extremity symptoms.  She denies difficulty speaking or with vision.  She reports she is still having an occipital headache that is mild at this time.  Blood pressures were in the 180s on my evaluation.  She denies any chest pain, palpitations, shortness of breath.  No recent Covid symptoms.  On exam, no focal neurologic deficits with normal sensation, strength, coordination on my exam.  Normal finger-nose-finger testing bilaterally.  Pupils are symmetric and reactive.  Clear speech.  No facial droop.  Normal sensation of  the face.  Lungs clear chest nontender.  Abdomen nontender.  Had a long shared decision-making conversation with patient and family and we agreed to not do any labs or imaging at this time.  As patient has no new symptoms aside from the persistent headache, we are going to treat her with the newly prescribed hydralazine dose of 25 mg and monitor her closely for improving symptoms.  If patient has any new symptoms or worsening headache, will consider repeat labs and imaging and possible further work-up.  Patient and family would like to go home but they agreed to monitor blood pressure for period of time.  Do not feel patient is having hypertensive emergency at this time and they are very agreeable to this plan to continue outpatient neurosurgical work-up and management.  Anticipate discharge if blood pressure improves and patient continues to feel well.  9:40 PM Patient's family returned home so she get the telmisartan which we do not have in our medication dispenser.  After getting the new dose of hydralazine and the telmisartan, patient's blood pressure has improved into the 150s.  She is feeling better.  We had another long discussion about further CT imaging, labs, or even neurosurgical consultation and transfer to [facility] and they are agreeable to going home since he has proven stability over several hours and is feeling better.  They will call the neurosurgical team tomorrow for discussions of outpatient follow-up as well as the PCP for further blood pressure management recommendations.  They understand strict return precautions for any new or worsening symptoms and agreed with discharge.  Patient and family had no other questions or concerns and patient was discharged in good condition with improving blood pressures and improved headache.   Final Clinical Impressions(s) / ED Diagnoses   Final diagnoses:  Acute nonintractable headache, unspecified headache type  Elevated blood  pressure reading    ED Discharge Orders    None     Clinical Impression: 1. Acute nonintractable headache, unspecified headache type   2. Elevated blood pressure reading     Disposition: Discharge  Condition: Good  I have discussed the results, Dx and Tx plan with the pt(& family if present). He/she/they expressed understanding and agree(s) with the plan. Discharge instructions discussed at great length. Strict return precautions discussed and pt &/or family have verbalized understanding of the instructions. No further questions at time of discharge.    Discharge Medication List as of 06/14/2019  9:44 PM      Follow Up: Your PCP and the neurosurgery team for follow up     [place] [address] [id number] [place]       Jalayne Ganesh, Gwenyth Allegra, MD 06/15/19 [phone]

## 2019-06-14 NOTE — ED Notes (Signed)
Pt ambulatory to bathroom without difficulty.  pts daughter returned from going home to get pts night BP med (micardis) that we do not have in this ED,

## 2019-06-14 NOTE — Discharge Instructions (Signed)
Your blood pressures today improved after you took your medicine.  We watched you for several hours and he did not have any worsening symptoms.  You had no focal neurologic deficits on exam.  After our prolonged shared decision-making conversation, we agreed with you being stable for discharge with plans to follow-up with both your PCP for further blood pressure management and the neurosurgery team for your aneurysm.  We together did not feel it was necessary to get repeat work-up from earlier today or transfer at this time.  Please follow-up as we discussed and if any symptoms change or worsen, please return to the nearest emergency department immediately.

## 2019-06-14 NOTE — Discharge Instructions (Addendum)
You were seen today for hypertension.  It is very important that you follow-up with your primary physician and keep a close eye on your blood pressure.  You may need adjustments in your blood pressure medication.  Incidentally you were found to have a cerebral aneurysm.  You should follow-up with neurosurgery [facility]) regarding outpatient evaluation and follow-up for this.  If you develop acute onset worsening headache or strokelike symptoms, you should call 911 immediately.

## 2019-06-14 NOTE — ED Notes (Signed)
Patient transported to CT 

## 2019-06-14 NOTE — ED Provider Notes (Signed)
[facility] EMERGENCY DEPARTMENT Provider Note   CSN: [id number] Arrival date & time: 06/13/19  2250     History   Chief Complaint Chief Complaint  Patient presents with   Hypertension    HPI Cassandra Sims is a 85 y.o. female.     HPI  This is an 85 year old female with a history of hypertension, peripheral vascular disease who presents with concerns for headache and hypertension.  Patient reports over the last 3 to 4 weeks she has had persistent waxing and waning headache over the posterior aspect of her head.  She states that at times it is dull but that it intensifies.  She attributes this to increasing blood pressures.  She states normally her blood pressure at home has been [id number] to [id number] systolic.  She did follow-up with her primary physician.  She was transitioned from amlodipine to hydralazine.  She took her hydralazine and noted her blood pressure at home to be [id number] systolic today.  She reports intermittent blurry vision.  Denies double vision or vision cuts.  He denies any weakness, numbness, tingling, speech difficulty.  She does report unsteady gait that has been ongoing "for longer than 3 to 4 weeks."  Denies any recent fevers, shortness of breath.  Past Medical History:  Diagnosis Date   Anxiety    Arthritis    Asthma    Complication of anesthesia    hard to wake up, 2009  sit up in bed to fast and BP dropped low code blue called per pt.   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 03/30/2018   Hyponatremia 01/13/2018   Prolonged QT interval 01/13/2018   S/P knee replacement 01/13/2018   Hypokalemia    OA (osteoarthritis) of knee 01/05/2018   GERD (gastroesophageal reflux disease) 07/03/2016   Hemidiaphragm paralysis 07/03/2016   Nodule of right lung 06/19/2016   HTN (hypertension) 03/29/2016   Glaucoma 03/29/2016   Arthritis 03/29/2016   Sinusitis, chronic 11/25/2012   Cough variant asthma 11/25/2012   Cough 11/24/2012    Past Surgical History:  Procedure Laterality Date   ABDOMINAL HYSTERECTOMY     APPENDECTOMY     arthroscopy right knee     BREAST SURGERY     biopsy left    COLECTOMY  2008   obstruction   DIAGNOSTIC LAPAROSCOPY     exp   DILATION AND CURETTAGE OF UTERUS     EYE SURGERY     cataract   bil and retina surgery right eye, glaucoma surgery, right eye   FUNCTIONAL ENDOSCOPIC SINUS SURGERY  2009   bilat ethm,frontal,max   JOINT REPLACEMENT     right total knee arthroplasty 01-05-18 aluisio   KNEE ARTHROSCOPY     right with synovectomy Dr. Wynelle Link 03-30-18    KNEE ARTHROSCOPY Right 03/30/2018   Procedure:  right knee scope; synovectomy;  Surgeon: Gaynelle Arabian, MD;  Location: [facility];  Service: Orthopedics;  Laterality: Right;  28min   NASAL SINUS SURGERY Bilateral 10/26/2012   Procedure: BILATERAL ENDOSCOPIC REVISION ETHMOID MAXILLARY AND FRONTAL SINUS SURGERY  ;  Surgeon: Izora Gala, MD;  Location: [facility];  Service: ENT;  Laterality: Bilateral;   OVARIAN CYST SURGERY     TOTAL KNEE ARTHROPLASTY Right 01/05/2018   Procedure: RIGHT TOTAL KNEE ARTHROPLASTY;  Surgeon: Gaynelle Arabian, MD;  Location: [facility];  Service: Orthopedics;  Laterality: Right;   TUBAL LIGATION     URETER SURGERY     cut accidentally during exp lap-ovarien cyst   VEIN LIGATION       OB History   No obstetric history on file.       Home Medications    Prior to Admission medications   Medication Sig Start Date End Date Taking? Authorizing Provider  DULoxetine (CYMBALTA) 20 MG capsule Take 20 mg by mouth once. 06/07/19  Yes [provider]  hydrALAZINE (APRESOLINE) 10 MG tablet Take 10 mg by mouth once. 06/11/19  Yes [provider]  telmisartan (MICARDIS) 80 MG tablet Take 80 mg by mouth once. 06/08/19  Yes [provider]  acetaminophen (TYLENOL) 500 MG tablet Take 1,000 mg by mouth every 8 (eight) hours as needed for mild pain.    [provider]  albuterol (PROVENTIL HFA;VENTOLIN HFA) 108 (90 BASE) MCG/ACT inhaler Inhale 2 puffs into the lungs every 4 (four) hours as needed for wheezing. Reported on 10/10/2015    [provider]  amLODipine (NORVASC) 5 MG tablet Take 5 mg by mouth daily.  01/29/17   [provider]  Azelastine HCl 0.15 % SOLN Place 1 spray into both nostrils 2 (two) times daily. 05/10/19   Kozlow, Donnamarie Poag, MD  Benralizumab (FASENRA) 30 MG/ML SOSY Inject 30 mg into the skin every 8 (eight) weeks.    [provider]  Calcium Carb-Cholecalciferol (CALTRATE 600+D3 PO) Take 1 tablet by mouth daily.    [provider]  carboxymethylcellulose (REFRESH TEARS) 0.5 % SOLN Place 1 drop into both eyes 5 (five) times daily as needed (for dry eyes).    [provider]  fexofenadine (ALLEGRA) 180 MG tablet TAKE 1 TABLET BY MOUTH EVERY DAY 01/04/19   Kozlow, Donnamarie Poag, MD  fluticasone (FLONASE) 50 MCG/ACT nasal spray Use one spray in each nostril twice daily as directed. 06/16/18   Kozlow, Donnamarie Poag, MD  Lactobacillus (ACIDOPHILUS PO) Take 1 capsule by mouth 3 (three) times a week.     [provider]  Misc Natural Products (LUTEIN 20) CAPS Take 20 mg by mouth daily with lunch.     [provider]  mometasone (ELOCON) 0.1 % ointment Apply topically 2 (two) times daily. 06/10/18   Kozlow, Donnamarie Poag, MD  mometasone-formoterol  (DULERA) 100-5 MCG/ACT AERO Take 2 puffs first thing in am and then another 2 puffs about 12 hours later. Patient taking differently: Inhale 2 puffs into the lungs 2 (two) times daily as needed. Take 2 puffs first thing in am and then another 2 puffs about 12 hours later. 03/17/18   Kozlow, Donnamarie Poag, MD  montelukast (SINGULAIR) 10 MG tablet Take 10 mg by mouth at bedtime.    [provider]  Multiple Vitamin (MULTIVITAMIN WITH MINERALS) TABS tablet Take 1 tablet by mouth daily. Nature Made Multi Vitamin for Her 50+ W/No Iron    [provider]  predniSONE (  DELTASONE) 5 MG tablet Take 1 tablet (5 mg total) by mouth daily with breakfast. 10/20/18   Kozlow, Donnamarie Poag, MD  psyllium (METAMUCIL) 58.6 % powder Take 1 packet by mouth daily. 14.3 g (1 heaping tablespoon) every morning.    [provider]  telmisartan (MICARDIS) 80 MG tablet Take 1 tablet (80 mg total) by mouth daily. 01/16/18   Eugenie Filler, MD  Turmeric 450 MG CAPS Take 450 mg by mouth daily with lunch.     [provider]    Family History Family History  Problem Relation Age of Onset   Glaucoma Mother    Arthritis Mother    Lung disease Sister    Breast cancer Daughter    Diabetes Mellitus I Other     Social History Social History   Tobacco Use   Smoking status: Never Smoker   Smokeless tobacco: Never Used  Substance Use Topics   Alcohol use: No   Drug use: No     Allergies   Penicillins, Adhesive [tape], Clindamycin/lincomycin, Codeine, Darvocet [propoxyphene n-acetaminophen], Dilaudid [hydromorphone hcl], Flagyl [metronidazole], Lactose intolerance (gi), Levofloxacin, Macrodantin [nitrofurantoin macrocrystal], Morphine and related, Premarin [conjugated estrogens], Tramadol hcl, Vancomycin, Zantac [ranitidine hcl], Zofran [ondansetron hcl], Nitrofuran derivatives, and Sulfa antibiotics   Review of Systems Review of Systems  Constitutional: Negative for fever.  Eyes:  Positive for visual disturbance. Negative for photophobia.  Respiratory: Negative for shortness of breath.   Cardiovascular: Negative for chest pain.  Gastrointestinal: Negative for abdominal pain, nausea and vomiting.  Genitourinary: Negative for dysuria.  Neurological: Positive for headaches. Negative for dizziness and speech difficulty.  All other systems reviewed and are negative.    Physical Exam Updated Vital Signs BP 138/80    Pulse 71    Temp 97.7 F (36.5 C) (Oral)    Resp 19    Ht 1.575 m (5\' 2" )    Wt 53.5 kg    SpO2 95%    BMI 21.58 kg/m   Physical Exam Vitals signs and nursing note reviewed.  Constitutional:      Appearance: She is well-developed. She is not ill-appearing.  HENT:     Head: Normocephalic and atraumatic.     Nose: Nose normal.     Mouth/Throat:     Mouth: Mucous membranes are moist.  Eyes:     Pupils: Pupils are equal, round, and reactive to light.  Neck:     Musculoskeletal: Neck supple.  Cardiovascular:     Rate and Rhythm: Normal rate and regular rhythm.     Heart sounds: Normal heart sounds.  Pulmonary:     Effort: Pulmonary effort is normal. No respiratory distress.     Breath sounds: No wheezing.  Abdominal:     General: Bowel sounds are normal.     Palpations: Abdomen is soft.     Tenderness: There is no abdominal tenderness.  Musculoskeletal:     Right lower leg: No edema.     Left lower leg: No edema.  Skin:    General: Skin is warm and dry.  Neurological:     Mental Status: She is alert and oriented to person, place, and time.     Comments: Fluent speech, cranial nerves II through XII intact, 5 out of 5 strength in all 4 extremities, no dysmetria to finger-nose-finger  Psychiatric:        Mood and Affect: Mood normal.      ED Treatments / Results  Labs (all labs ordered are listed, but only  abnormal results are displayed) Labs Reviewed  BASIC METABOLIC PANEL - Abnormal; Notable for the following components:      Result  Value   Glucose, Bld 117 (*)    All other components within normal limits  CSF CELL COUNT WITH DIFFERENTIAL - Abnormal; Notable for the following components:   RBC Count, CSF 2 (*)    All other components within normal limits  CSF CULTURE  CBC WITH DIFFERENTIAL/PLATELET  CSF CELL COUNT WITH DIFFERENTIAL  GLUCOSE, CSF  PROTEIN, CSF    EKG EKG Interpretation  Date/Time:  Sunday June 13 2019 23:10:36 EST Ventricular Rate:  67 PR Interval:  160 QRS Duration: 90 QT Interval:  408 QTC Calculation: 431 R Axis:   52 Text Interpretation: Normal sinus rhythm Normal ECG Confirmed by Thayer Jew [phone]) on 06/14/2019 4:00:10 AM   Radiology Ct Angio Head W Or Wo Contrast  Result Date: 06/14/2019 CLINICAL DATA:  Basilar are aneurysm. Dizziness and hypertension. EXAM: CT ANGIOGRAPHY HEAD AND NECK TECHNIQUE: Multidetector CT imaging of the head and neck was performed using the standard protocol during bolus administration of intravenous contrast. Multiplanar CT image reconstructions and MIPs were obtained to evaluate the vascular anatomy. Carotid stenosis measurements (when applicable) are obtained utilizing NASCET criteria, using the distal internal carotid diameter as the denominator. CONTRAST:  49mL OMNIPAQUE IOHEXOL 350 MG/ML SOLN COMPARISON:  None. FINDINGS: CTA NECK FINDINGS SKELETON: There is no bony spinal canal stenosis. No lytic or blastic lesion. OTHER NECK: Normal pharynx, larynx and major salivary glands. No cervical lymphadenopathy. Unremarkable thyroid gland. UPPER CHEST: Incompletely visualized opacity of the left lower lobe, possibly atelectasis or consolidation. AORTIC ARCH: There is mild calcific atherosclerosis of the aortic arch. There is no aneurysm, dissection or hemodynamically significant stenosis of the visualized portion of the aorta. Normal variant aortic arch branching pattern with the brachiocephalic and left common carotid arteries sharing a common origin. The  visualized proximal subclavian arteries are widely patent. RIGHT CAROTID SYSTEM: Normal without aneurysm, dissection or stenosis. LEFT CAROTID SYSTEM: Normal without aneurysm, dissection or stenosis. VERTEBRAL ARTERIES: Right dominant configuration. Both origins are clearly patent. There is no dissection, occlusion or flow-limiting stenosis to the skull base (V1-V3 segments). CTA HEAD FINDINGS POSTERIOR CIRCULATION: --Vertebral arteries: Normal V4 segments. --Posterior inferior cerebellar arteries (PICA): Patent origins from the vertebral arteries. --Anterior inferior cerebellar arteries (AICA): Patent origins from the basilar artery. --Basilar artery: There is a leftward projecting aneurysm of the basilar tip measuring 0.9 cm AP x 0.9 cm TV x 0.6 cm CC . --Superior cerebellar arteries: Normal. --Posterior cerebral arteries: Normal. Both originate from the basilar artery. Posterior communicating arteries (p-comm) are diminutive or absent. ANTERIOR CIRCULATION: --Intracranial internal carotid arteries: Normal. --Anterior cerebral arteries (ACA): Normal. Both A1 segments are present. Patent anterior communicating artery (a-comm). --Middle cerebral arteries (MCA): Normal. VENOUS SINUSES: As permitted by contrast timing, patent. ANATOMIC VARIANTS: None Review of the MIP images confirms the above findings. IMPRESSION: 1. 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 Non-emergent Neuro-Interventional Radiology or Neurosurgery referral is suggested to evaluate the appropriateness of potential treatment. 2. No intracranial arterial occlusion or high-grade stenosis. 3. Incompletely visualized opacity of the left lower lobe, possibly atelectasis or consolidation. 4. Aortic Atherosclerosis (ICD10-I70.0). Electronically Signed   By: Ulyses Jarred M.D.   On: 06/14/2019 01:57   Ct Head Wo Contrast  Result Date: 06/14/2019 CLINICAL DATA:  Blurry vision and dizziness with headaches EXAM: CT HEAD WITHOUT CONTRAST  TECHNIQUE: Contiguous axial images were  obtained from the base of the skull through the vertex without intravenous contrast. COMPARISON:  06/19/2016 FINDINGS: Brain: No findings to suggest acute hemorrhage, acute infarction or space-occupying mass lesion are seen. Changes consistent with mild atrophy and chronic white matter ischemic changes are seen. Vascular: There is focal aneurysmal dilatation of the mid basilar artery to 1 cm. Skull: Normal. Negative for fracture or focal lesion. Sinuses/Orbits: Postsurgical changes in the right orbit. Mucosal thickening within the maxillary antra is noted bilaterally left greater than right. Other: None IMPRESSION: No acute intracranial abnormality is noted. Chronic white matter ischemic changes and mild atrophic changes are seen. Focal aneurysmal dilatation of the mid basilar artery of uncertain chronicity. This was not well visualized on the prior CT of the neck from 06/19/2016. Nonemergent workup is recommended. Electronically Signed   By: Inez Catalina M.D.   On: 06/14/2019 00:57   Ct Angio Neck W And/or Wo Contrast  Result Date: 06/14/2019 CLINICAL DATA:  Basilar are aneurysm. Dizziness and hypertension. EXAM: CT ANGIOGRAPHY HEAD AND NECK TECHNIQUE: Multidetector CT imaging of the head and neck was performed using the standard protocol during bolus administration of intravenous contrast. Multiplanar CT image reconstructions and MIPs were obtained to evaluate the vascular anatomy. Carotid stenosis measurements (when applicable) are obtained utilizing NASCET criteria, using the distal internal carotid diameter as the denominator. CONTRAST:  49mL OMNIPAQUE IOHEXOL 350 MG/ML SOLN COMPARISON:  None. FINDINGS: CTA NECK FINDINGS SKELETON: There is no bony spinal canal stenosis. No lytic or blastic lesion. OTHER NECK: Normal pharynx, larynx and major salivary glands. No cervical lymphadenopathy. Unremarkable thyroid gland. UPPER CHEST: Incompletely visualized opacity of the  left lower lobe, possibly atelectasis or consolidation. AORTIC ARCH: There is mild calcific atherosclerosis of the aortic arch. There is no aneurysm, dissection or hemodynamically significant stenosis of the visualized portion of the aorta. Normal variant aortic arch branching pattern with the brachiocephalic and left common carotid arteries sharing a common origin. The visualized proximal subclavian arteries are widely patent. RIGHT CAROTID SYSTEM: Normal without aneurysm, dissection or stenosis. LEFT CAROTID SYSTEM: Normal without aneurysm, dissection or stenosis. VERTEBRAL ARTERIES: Right dominant configuration. Both origins are clearly patent. There is no dissection, occlusion or flow-limiting stenosis to the skull base (V1-V3 segments). CTA HEAD FINDINGS POSTERIOR CIRCULATION: --Vertebral arteries: Normal V4 segments. --Posterior inferior cerebellar arteries (PICA): Patent origins from the vertebral arteries. --Anterior inferior cerebellar arteries (AICA): Patent origins from the basilar artery. --Basilar artery: There is a leftward projecting aneurysm of the basilar tip measuring 0.9 cm AP x 0.9 cm TV x 0.6 cm CC . --Superior cerebellar arteries: Normal. --Posterior cerebral arteries: Normal. Both originate from the basilar artery. Posterior communicating arteries (p-comm) are diminutive or absent. ANTERIOR CIRCULATION: --Intracranial internal carotid arteries: Normal. --Anterior cerebral arteries (ACA): Normal. Both A1 segments are present. Patent anterior communicating artery (a-comm). --Middle cerebral arteries (MCA): Normal. VENOUS SINUSES: As permitted by contrast timing, patent. ANATOMIC VARIANTS: None Review of the MIP images confirms the above findings. IMPRESSION: 1. 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 Non-emergent Neuro-Interventional Radiology or Neurosurgery referral is suggested to evaluate the appropriateness of potential treatment. 2. No intracranial arterial occlusion or  high-grade stenosis. 3. Incompletely visualized opacity of the left lower lobe, possibly atelectasis or consolidation. 4. Aortic Atherosclerosis (ICD10-I70.0). Electronically Signed   By: Ulyses Jarred M.D.   On: 06/14/2019 01:57    Procedures .Lumbar Puncture  Date/Time: 06/14/2019 5:54 AM Performed by: Merryl Hacker, MD Authorized by: Merryl Hacker,  MD   Consent:    Consent obtained:  Verbal   Consent given by:  Patient   Risks discussed:  Bleeding, infection, pain and headache   Alternatives discussed:  Observation Pre-procedure details:    Procedure purpose:  Diagnostic   Preparation: Patient was prepped and draped in usual sterile fashion   Anesthesia (see MAR for exact dosages):    Anesthesia method:  Local infiltration   Local anesthetic:  Lidocaine 1% w/o epi Procedure details:    Lumbar space:  L3-L4 interspace   Patient position:  L lateral decubitus   Needle gauge:  18   Needle type:  Spinal needle - Quincke tip   Needle length (in):  3.5   Ultrasound guidance: no     Number of attempts:  1   Opening pressure (cm H2O):  19   Fluid appearance:  Clear   Tubes of fluid:  4   Total volume (ml):  5 Post-procedure:    Puncture site:  Adhesive bandage applied   Patient tolerance of procedure:  Tolerated well, no immediate complications   (including critical care time)  Medications Ordered in ED Medications  hydrALAZINE (APRESOLINE) injection 10 mg (10 mg Intravenous Given 06/14/19 0020)  iohexol (OMNIPAQUE) 350 MG/ML injection 75 mL (75 mLs Intravenous Contrast Given 06/14/19 0126)     Initial Impression / Assessment and Plan / ED Course  I have reviewed the triage vital signs and the nursing notes.  Pertinent labs & imaging results that were available during my care of the patient were reviewed by me and considered in my medical decision making (see chart for details).  Clinical Course as of Jun 14 104  Mon Jun 14, 2019  0200 Lengthy discussion  with the patient, her daughter, and her grandson regarding results.  She does have a small subcentimeter aneurysm at the left basilar artery.  While the description of her headache does not sound ominous, CTA at this point would not rule out subarachnoid given duration of symptoms.  We discussed the risk and benefits of LP to evaluate for subarachnoid hemorrhage.  Patient and her family elected to proceed.   [CH]    Clinical Course User Index [CH] Baelyn Doring, Barbette Hair, MD       Patient presents with headache and hypertension.  She is overall nontoxic-appearing and vital signs notable for blood pressure systolic in the [id number].  Patient reports this is new for her.  She did recently have a medication change.  She reports ongoing headache on and off for the last several weeks.  Patient was given IV hydralazine.  Lab work obtained and largely reassuring.  EKG without ischemic changes.  CT head did show a small area concerning for possible aneurysm.  CTA was obtained and does confirm a subcentimeter basilar artery aneurysm.  Had a prolonged discussion as above with the patient and her family.  Have a low suspicion for indolent or occult bleed; however, it is possible and CTA at this time would not be 100% given the duration of symptoms.  Patient's family and patient decided to move forward with LP.  LP results are very reassuring with 2 red cells in initial 2 and 0 red cells in the last 2 and no xanthochromia.  Feel that patient can follow-up as an outpatient for evaluation for treatment.  Will provide neurosurgery follow-up.  After history, exam, and medical workup I feel the patient has been appropriately medically screened and is safe for discharge home. Pertinent diagnoses were discussed  with the patient. Patient was given return precautions.   Final Clinical Impressions(s) / ED Diagnoses   Final diagnoses:  Essential hypertension  Basilar artery aneurysm [facility])    ED Discharge Orders    None        Dina Rich, Barbette Hair, MD 06/15/19 0107

## 2019-06-17 LAB — CSF CULTURE W GRAM STAIN: Culture: NO GROWTH

## 2019-06-25 ENCOUNTER — Ambulatory Visit: Payer: Self-pay

## 2019-07-06 ENCOUNTER — Ambulatory Visit (INDEPENDENT_AMBULATORY_CARE_PROVIDER_SITE_OTHER): Payer: Medicare Other

## 2019-07-06 ENCOUNTER — Other Ambulatory Visit: Payer: Self-pay

## 2019-07-06 DIAGNOSIS — J4540 Moderate persistent asthma, uncomplicated: Secondary | ICD-10-CM

## 2019-07-06 DIAGNOSIS — J4550 Severe persistent asthma, uncomplicated: Secondary | ICD-10-CM | POA: Diagnosis not present

## 2019-07-13 ENCOUNTER — Telehealth: Payer: Self-pay

## 2019-07-13 NOTE — Telephone Encounter (Signed)
Pt calling today c/o itching.  Pt states that she is taking famotidine and claritin with no relief.  Offered patient an appointment phone visit today with Gareth Morgan, NP and she declined.  Wanted to wait to see Dr Neldon Mc. Pt has not been using the mometasone cream as instructed.  Explained to patient that she can continue her claritin and famotidine, she was okay with waiting until next week for a plan for her itching.

## 2019-07-20 ENCOUNTER — Ambulatory Visit (INDEPENDENT_AMBULATORY_CARE_PROVIDER_SITE_OTHER): Payer: Medicare Other | Admitting: Allergy and Immunology

## 2019-07-20 ENCOUNTER — Other Ambulatory Visit: Payer: Self-pay

## 2019-07-20 ENCOUNTER — Ambulatory Visit
Admission: RE | Admit: 2019-07-20 | Discharge: 2019-07-20 | Disposition: A | Discharge status: Home / Self Care | Payer: Medicare Other | Source: Ambulatory Visit | Attending: Allergy and Immunology | Admitting: Allergy and Immunology

## 2019-07-20 ENCOUNTER — Encounter: Payer: Self-pay | Admitting: Allergy and Immunology

## 2019-07-20 VITALS — BP 162/82 | HR 68 | Temp 98.2°F | Resp 18

## 2019-07-20 DIAGNOSIS — J181 Lobar pneumonia, unspecified organism: Secondary | ICD-10-CM | POA: Diagnosis not present

## 2019-07-20 DIAGNOSIS — J3089 Other allergic rhinitis: Secondary | ICD-10-CM

## 2019-07-20 DIAGNOSIS — J4540 Moderate persistent asthma, uncomplicated: Secondary | ICD-10-CM | POA: Diagnosis not present

## 2019-07-20 DIAGNOSIS — D7219 Other eosinophilia: Secondary | ICD-10-CM

## 2019-07-20 DIAGNOSIS — M3500 Sicca syndrome, unspecified: Secondary | ICD-10-CM

## 2019-07-20 DIAGNOSIS — L2089 Other atopic dermatitis: Secondary | ICD-10-CM | POA: Diagnosis not present

## 2019-07-20 MED ORDER — ALBUTEROL SULFATE HFA 108 (90 BASE) MCG/ACT IN AERS: 2.0000 | INHALATION_SPRAY | RESPIRATORY_TRACT | 2 refills | Status: DC | PRN

## 2019-07-20 MED ORDER — FLUTICASONE PROPIONATE 50 MCG/ACT NA SUSP: 1.0000 | Freq: Two times a day (BID) | NASAL | 5 refills | Status: DC

## 2019-07-20 MED ORDER — MONTELUKAST SODIUM 10 MG PO TABS: 10.0000 mg | ORAL_TABLET | Freq: Every day | ORAL | 5 refills | Status: DC

## 2019-07-20 MED ORDER — MUPIROCIN 2 % EX OINT: 1.0000 "application " | TOPICAL_OINTMENT | Freq: Three times a day (TID) | CUTANEOUS | 0 refills | Status: DC

## 2019-07-20 NOTE — Progress Notes (Signed)
[place] [facility] [place] [facility] [place]   Follow-up Note  Referring Provider: No ref. provider found Primary Provider: Patrecia Pour, Christean Grief, MD Date of Office Visit: 07/20/2019  Subjective:   Cassandra Sims (DOB: 1932/08/15) is a 85 y.o. female who returns to the [facility] on 07/20/2019 in re-evaluation of the following:  HPI: Heldana returns to this clinic in reevaluation of asthma and allergic rhinitis and a history of atopic dermatitis and pruritic disorder and dry eye syndrome.  I last had contact with her in this clinic via an E - med visit on 20 October 2018.  Concerning her lower airways, she has really done very well and has very little lung symptomatology and does not need the use a bronchodilator greater than 1 time per month usually for very short duration cough.  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  Her upper airway is a different issue.  She has lots of stuffiness and nasal congestion.  She feels as though her nose is dry.  She does have dry eye syndrome and dry mouth as well.  She uses serum eyedrops from [facility] to help her eyes.  She has been consistently using Flonase and azelastine nose spray for her nose.  She thinks her reflux is under very good control while using Pepcid at this point in time.  She does burp a fair amount but has no other significant reflux symptoms and no throat issues.  She is still very itchy.  Nothing that we have used in the past is really helped this issue.  She is somewhat limited about using antihistamines because of her sicca syndrome.  She did visit with a dermatologist 2 months ago who gave her triamcinolone ointment which she has used on her left arm which has helped this issue somewhat.  But she basically has itchiness everywhere.  Allergies as of 07/20/2019      Reactions   Penicillins  Shortness Of Breath, Swelling   Has patient had a PCN reaction causing immediate rash, facial/tongue/throat swelling, SOB or lightheadedness with hypotension: Yes Has patient had a PCN reaction causing severe rash involving mucus membranes or skin necrosis: No Has patient had a PCN reaction that required hospitalization: No Has patient had a PCN reaction occurring within the last 10 years: No If all of the above answers are "NO", then may proceed with Cephalosporin use.   Adhesive [tape] Itching   BAND-AID TAPE-skin redness/itching   Clindamycin/lincomycin Other (See Comments)   Severe joint pain.   Codeine Nausea And Vomiting   Darvocet [propoxyphene N-acetaminophen] Other (See Comments)   HTN   Dilaudid [hydromorphone Hcl] Nausea And Vomiting   DROP BLOOD PRESSURE HYPOTENSIVE   Flagyl [metronidazole] Other (See Comments)   Joint pains   Lactose Intolerance (gi)    Levofloxacin    Joint pain   Macrodantin [nitrofurantoin Macrocrystal]    Morphine And Related    hallucinate   Premarin [conjugated Estrogens] Itching   Tramadol Hcl Other (See Comments)   Causes insomnia   Vancomycin Hives, Itching   Zantac [ranitidine Hcl]    Itching    Zofran [ondansetron Hcl]    HTN   Denosumab Rash   Nitrofuran Derivatives Rash   Sulfa Antibiotics Rash      Medication List    acetaminophen 500 MG tablet Commonly known as: TYLENOL Take  1,000 mg by mouth every 8 (eight) hours as needed for mild pain.   ACIDOPHILUS PO Take 1 capsule by mouth 3 (three) times a week.   albuterol 108 (90 Base) MCG/ACT inhaler Commonly known as: VENTOLIN HFA Inhale 2 puffs into the lungs every 4 (four) hours as needed for wheezing. Reported on 10/10/2015   amLODipine 5 MG tablet Commonly known as: NORVASC Take 5 mg by mouth daily.   Azelastine HCl 0.15 % Soln Place 1 spray into both nostrils 2 (two) times daily.   CALTRATE 600+D3 PO Take 1 tablet by mouth daily.   DULoxetine 20 MG  capsule Commonly known as: CYMBALTA Take 20 mg by mouth once.   famotidine 40 MG tablet Commonly known as: PEPCID Take 40 mg by mouth daily.   Fasenra 30 MG/ML Sosy Generic drug: Benralizumab Inject 30 mg into the skin every 8 (eight) weeks.   fexofenadine 180 MG tablet Commonly known as: ALLEGRA TAKE 1 TABLET BY MOUTH EVERY DAY   fluticasone 50 MCG/ACT nasal spray Commonly known as: FLONASE Use one spray in each nostril twice daily as directed.   hydrALAZINE 10 MG tablet Commonly known as: APRESOLINE Take 10 mg by mouth once.   Lutein 20 Caps Take 20 mg by mouth daily with lunch.   mometasone 0.1 % ointment Commonly known as: ELOCON Apply topically 2 (two) times daily.   mometasone-formoterol 100-5 MCG/ACT Aero Commonly known as: DULERA Take 2 puffs first thing in am and then another 2 puffs about 12 hours later. What changed:   how much to take  how to take this  when to take this  reasons to take this   montelukast 10 MG tablet Commonly known as: SINGULAIR Take 10 mg by mouth at bedtime.   multivitamin with minerals Tabs tablet Take 1 tablet by mouth daily. Nature Made Multi Vitamin for Her 50+ W/No Iron   psyllium 58.6 % powder Commonly known as: METAMUCIL Take 1 packet by mouth daily. 14.3 g (1 heaping tablespoon) every morning.   Refresh Tears 0.5 % Soln Generic drug: carboxymethylcellulose Place 1 drop into both eyes 5 (five) times daily as needed (for dry eyes).   telmisartan 80 MG tablet Commonly known as: Micardis Take 1 tablet (80 mg total) by mouth daily.   telmisartan 80 MG tablet Commonly known as: MICARDIS Take 80 mg by mouth once.   triamcinolone 0.025 % ointment Commonly known as: KENALOG Apply 1 application topically 2 (two) times daily.   Turmeric 450 MG Caps Take 450 mg by mouth daily with lunch.       Past Medical History:  Diagnosis Date  . Anxiety   . Arthritis   . Asthma   . Complication of anesthesia    hard  to wake up, 2009  sit up in bed to fast and BP dropped low code blue called per pt.  . Family history of adverse reaction to anesthesia    sister has [place]  . GERD (gastroesophageal reflux disease)   . Glaucoma   . Headache    hx of migrains  . HOH (hard of hearing)   . Hypertension   . Hypoglycemia   . Multiple allergies   . Peripheral vascular disease (HCC)    varicose veins , Laser vein surgery  . PONV (postoperative nausea and vomiting)   . Pre-diabetes   . Prolonged QT interval 01/13/2018  . Rhinitis   . S/P knee replacement 01/13/2018  . Wears glasses     Past  Surgical History:  Procedure Laterality Date  . ABDOMINAL HYSTERECTOMY    . APPENDECTOMY    . arthroscopy right knee    . BREAST SURGERY     biopsy left   . COLECTOMY  2008   obstruction  . DIAGNOSTIC LAPAROSCOPY     exp  . DILATION AND CURETTAGE OF UTERUS    . EYE SURGERY     cataract   bil and retina surgery right eye, glaucoma surgery, right eye  . FUNCTIONAL ENDOSCOPIC SINUS SURGERY  2009   bilat ethm,frontal,max  . JOINT REPLACEMENT     right total knee arthroplasty 01-05-18 aluisio  . KNEE ARTHROSCOPY     right with synovectomy Dr. Wynelle Link 03-30-18   . KNEE ARTHROSCOPY Right 03/30/2018   Procedure: right knee scope; synovectomy;  Surgeon: Gaynelle Arabian, MD;  Location: [facility];  Service: Orthopedics;  Laterality: Right;  21min  . NASAL SINUS SURGERY Bilateral 10/26/2012   Procedure: BILATERAL ENDOSCOPIC REVISION ETHMOID MAXILLARY AND FRONTAL SINUS SURGERY  ;  Surgeon: Izora Gala, MD;  Location: [facility];  Service: ENT;  Laterality: Bilateral;  . OVARIAN CYST SURGERY    . TOTAL KNEE ARTHROPLASTY Right 01/05/2018   Procedure: RIGHT TOTAL KNEE ARTHROPLASTY;  Surgeon: Gaynelle Arabian, MD;  Location: [facility];  Service: Orthopedics;  Laterality: Right;  . TUBAL LIGATION    . URETER SURGERY     cut accidentally during exp lap-ovarien cyst  . VEIN LIGATION      Review of systems negative except as  noted in HPI / PMHx or noted below:  Review of Systems  Constitutional: Negative.   HENT: Negative.   Eyes: Negative.   Respiratory: Negative.   Cardiovascular: Negative.   Gastrointestinal: Negative.   Genitourinary: Negative.   Musculoskeletal: Negative.   Skin: Negative.   Neurological: Negative.   Endo/Heme/Allergies: Negative.   Psychiatric/Behavioral: Negative.      Objective:   Vitals:   07/20/19 1456  BP: (!) 162/82  Pulse: 68  Resp: 18  Temp: 98.2 F (36.8 C)  SpO2: 98%          Physical Exam Constitutional:      Appearance: She is not diaphoretic.  HENT:     Head: Normocephalic.     Right Ear: Tympanic membrane, ear canal and external ear normal.     Left Ear: Tympanic membrane, ear canal and external ear normal.     Nose: Nose normal. No mucosal edema or rhinorrhea.     Mouth/Throat:     Pharynx: Uvula midline. No oropharyngeal exudate.  Eyes:     Conjunctiva/sclera: Conjunctivae normal.  Neck:     Thyroid: No thyromegaly.     Trachea: Trachea normal. No tracheal tenderness or tracheal deviation.  Cardiovascular:     Rate and Rhythm: Normal rate and regular rhythm.     Heart sounds: Normal heart sounds, S1 normal and S2 normal. No murmur.  Pulmonary:     Effort: No respiratory distress.     Breath sounds: Normal breath sounds. No stridor. No wheezing or rales.  Lymphadenopathy:     Head:     Right side of head: No tonsillar adenopathy.     Left side of head: No tonsillar adenopathy.     Cervical: No cervical adenopathy.  Skin:    Findings: Rash (Slightly excoriated erythematous patches left forearm) present. No erythema.     Nails: There is no clubbing.  Neurological:     Mental Status: She is alert.  Diagnostics:    Spirometry was performed and demonstrated an FEV1 of 1.10 at 69  % of predicted.  Results of blood tests obtained 14 June 2019 identifies WBC 4.1, absolute eosinophils 0, absolute lymphocyte 1100, hemoglobin 13.5,  platelet 191.  Results of a head CT scan obtained 14 June 2019 identified the following:  Sinuses/Orbits: Postsurgical changes in the right orbit. Mucosal thickening within the maxillary antra is noted bilaterally left greater than right.  Results of a CT scan angiography of neck obtained 14 June 2019 identifies the following:  UPPER CHEST: Incompletely visualized opacity of the left lower lobe, possibly atelectasis or consolidation.  Assessment and Plan:   1. Consolidation of left lower lobe of lung ([place])   2. Asthma, moderate persistent, well-controlled   3. Other allergic rhinitis   4. Other atopic dermatitis   5. Sicca syndrome (HCC)   6. Other eosinophilia     1. For skin inflammation use Elidel followed by mometasone 0.1% ointment 1-2 times per day for follow instructions from dermatologist  2. For nose issue:   A. Flonase 1 spray each nostril 2 time per day  B. montelukast 10 mg tablet 1 time per day  C. Discontinue Azelastine  D. Start Bactroban ointment in nose 3 times a day after nasal saline  3. For lung issue:   A. Fasenra every 8 weeks  B. Obtain a Chest X-ray  4. For reflux issue:   A. Famotidine 40mg  in PM   5. If needed:   A. Proventil HFA (or similar) 2 inhalations two times per day  B. Nasal saline  C. Loratadine 10mg  - 1/2 to 1 tablet daily (Dry eye???)  6. Obtain COVID vaccine when available    7. Return to clinic in 12 weeks or earlier if problem  Although Calandra is doing better regarding her eosinophilic driven airway issue she still has some problems with stuffiness of her nose which may actually be atrophic rhinitis more so than an anatomical issue or a mucosal swelling issue.  We will treat her with the therapy noted above which means removing her topical antihistamine and having her use some Bactroban ointment with lots of saline.  Her reflux appears to be under very good control on her famotidine.  She did have a CT scan of her  neck which identified a inadequately visualized consolidation of her left lower lobe which we will follow up with a chest x-ray.  We will see how things go over the course of the next several months.  Further evaluation and treatment will be based upon her response.  Allena Katz, MD Allergy / Immunology [place]

## 2019-07-20 NOTE — Patient Instructions (Addendum)
  1. For skin inflammation use Elidel followed by mometasone 0.1% ointment 1-2 times per day for follow instructions from dermatologist  2. For nose issue:   A. Flonase 1 spray each nostril 2 time per day  B. montelukast 10 mg tablet 1 time per day  C. Discontinue Azelastine  D. Start Bactroban ointment in nose 3 times a day after nasal saline  3. For lung issue:   A. Fasenra every 8 weeks  B. Obtain a Chest X-ray  4. For reflux issue:   A. Famotidine 40mg  in PM   5. If needed:   A. Proventil HFA (or similar) 2 inhalations two times per day  B. Nasal saline  C. Loratadine 10mg  - 1/2 to 1 tablet daily (Dry eye???)  6. Obtain COVID vaccine when available    7. Return to clinic in 12 weeks or earlier if problem

## 2019-07-21 ENCOUNTER — Encounter: Payer: Self-pay | Admitting: Allergy and Immunology

## 2019-07-21 ENCOUNTER — Telehealth: Payer: Self-pay | Admitting: Allergy and Immunology

## 2019-07-21 MED ORDER — PIMECROLIMUS 1 % EX CREA: TOPICAL_CREAM | CUTANEOUS | 5 refills | Status: DC

## 2019-07-21 NOTE — Telephone Encounter (Signed)
Patient called stating that she did not get the prescription for Elidel. Please call into pharmacy.

## 2019-07-21 NOTE — Telephone Encounter (Signed)
Prescription was sent in as requested 

## 2019-07-27 ENCOUNTER — Telehealth: Payer: Self-pay

## 2019-07-27 NOTE — Telephone Encounter (Signed)
PA for generic Elidel initiated through [email or web address]

## 2019-07-28 NOTE — Telephone Encounter (Signed)
Prior authorization has been approved and sent to the pharmacy.

## 2019-07-30 ENCOUNTER — Other Ambulatory Visit: Payer: Self-pay | Admitting: Allergy and Immunology

## 2019-08-02 ENCOUNTER — Telehealth: Payer: Self-pay

## 2019-08-02 NOTE — Telephone Encounter (Signed)
Patient made aware of recommendations per Dr. Maudie Mercury. Patient verbalizes the she believes her Montelukast is causing nightmares. Patient was instructed to stop her Montelukast. She verbalized understanding.

## 2019-08-02 NOTE — Telephone Encounter (Signed)
Please call patient and ask her what the problem is.  Is the skin irritated? Is she having bloody nose?  What is she taking right now?  Below were Dr. Bruna Potter recommendations.  For nose issue:           A. Flonase 1 spray each nostril 2 time per day           B. montelukast 10 mg tablet 1 time per day           C. Discontinue Azelastine           D. Start Bactroban ointment in nose 3 times a day after nasal saline

## 2019-08-02 NOTE — Telephone Encounter (Signed)
Please call patient. The bactroban was for the sore/irritation in her nose and not for nasal congestion.  She can apply with a cue tip to the area where the sore is 3 times a day. This should help with the healing of the area.   For the nasal congestion, I recommend doing the saline nasal wash and then spraying Flonase 1 spray per nostril twice a day. Then apply the Bactroban ointment.   Do this for another week, if not helping let us know.

## 2019-08-02 NOTE — Telephone Encounter (Signed)
Patient called stating the mupirocin ointment (BACTROBAN) is not helping her. She states the package that the medication came in states if the medicine hasn't helped within 3-5 days to stop. Patient has stopped this medication and would like to know what to try next.  Please Advise.

## 2019-08-02 NOTE — Telephone Encounter (Signed)
Patient called back and stated that she used the Bactroban but has not seen much change with the medication. She has stopped using the Bactroban and has been using her Flonase but is wondering if there is something else she can use to help. Please advise since Dr. Neldon Mc is out of office. Thank you.

## 2019-08-02 NOTE — Telephone Encounter (Signed)
Patient is currently taking all her regimen medication as prescribed. Patient states she was using the Bactroban for her nasal congestion and advise that it was not helping. He states she is doing the Flonase and [employer] amd taking 1/2 tablet of Claritin for her allergies and can only do 1/2 tablet since a full tablets causes her eyes to be dry. Patient did state she has a sore inside her nostril and not sure if she was completely understanding how to use the Bactroban? She would like to know what she can do with her soreness and nasal congestion? Please advise on this?

## 2019-08-14 IMAGING — CR DG CHEST 2V
2 series · 2 of 2 positions shown · non-contrast
Comparison: 09/12/2017

CLINICAL DATA: Weakness and confusion following knee surgery 2 days
ago.

EXAM:
CHEST - 2 VIEW

[w chest lat]
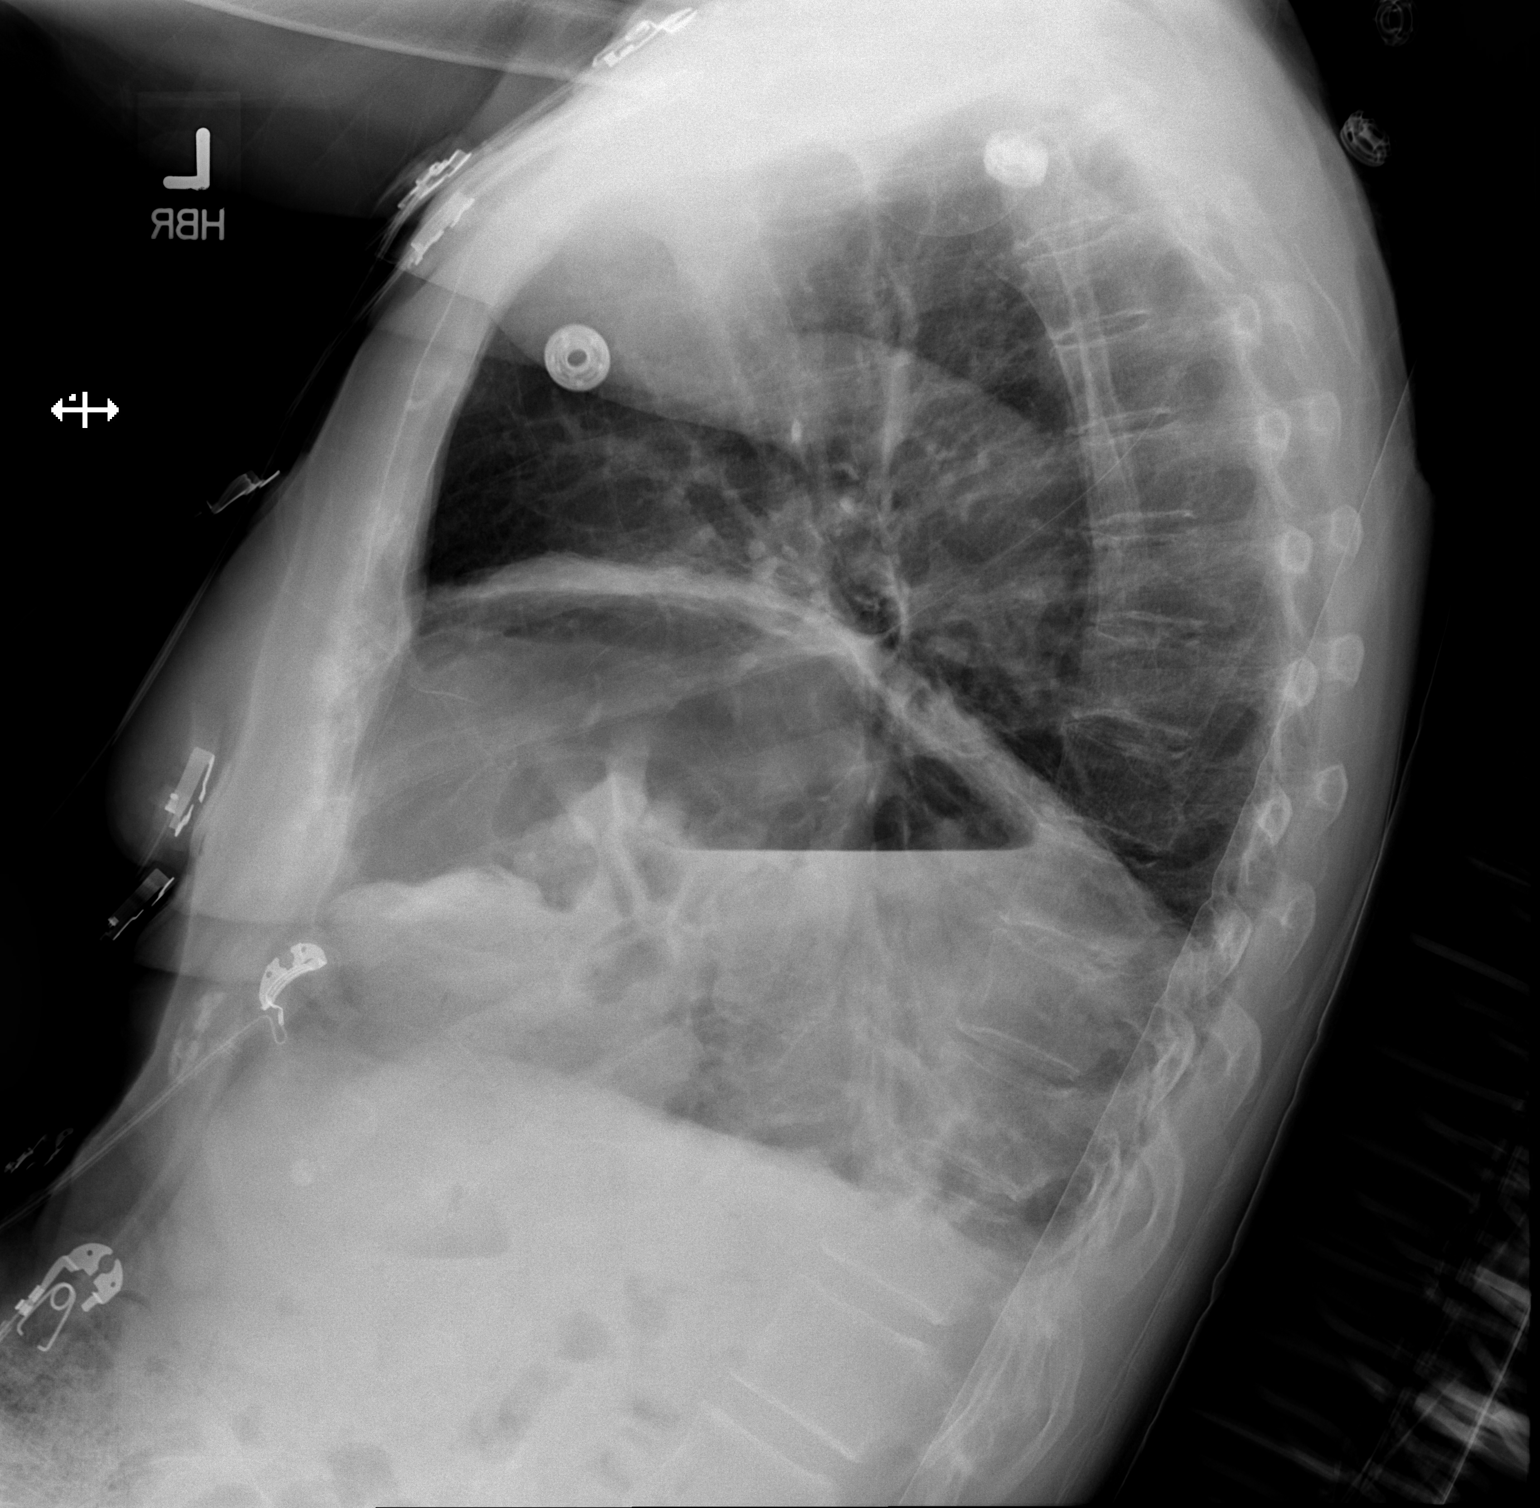

[x chest ap]
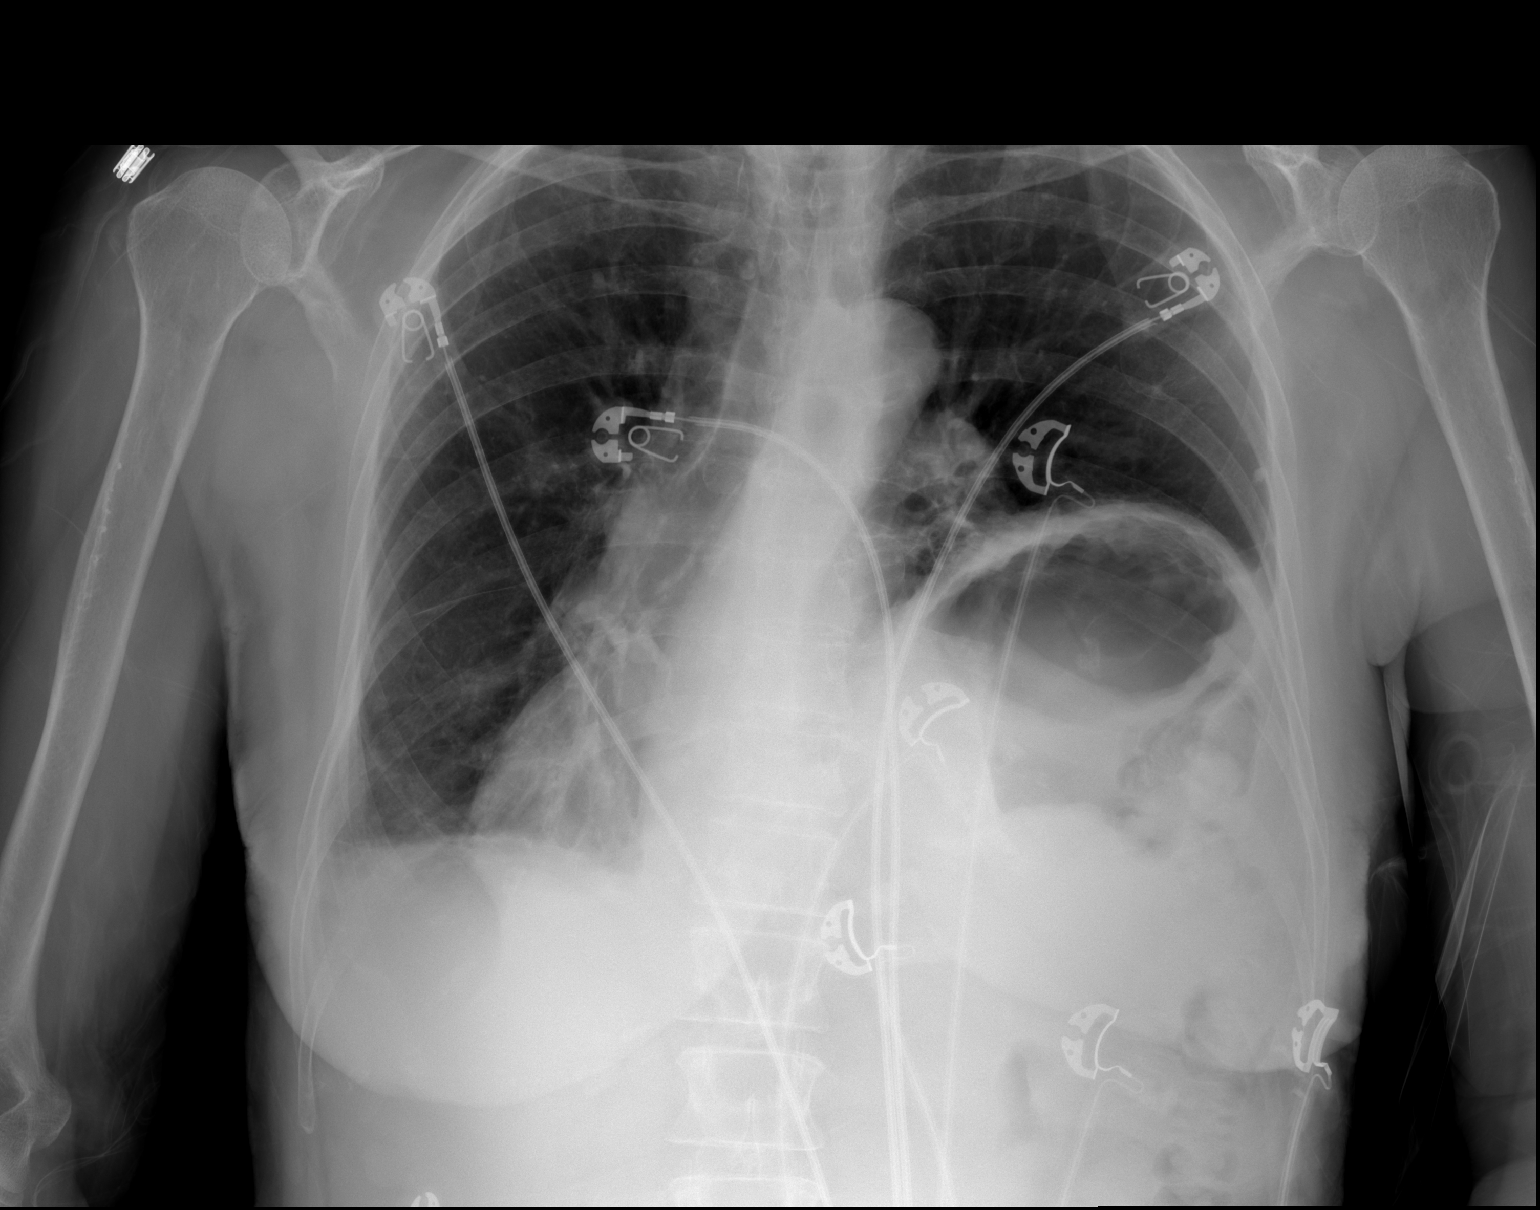

[2 of 2 positions shown; findings below may reference images not displayed]

FINDINGS: Chronic elevation of the left hemidiaphragm. Chronic volume loss at
both lung bases secondary to that. Volume loss at the right base may
be slightly above baseline. The upper lungs are clear. No acute bone
finding.
IMPRESSION: Chronic elevation of the left hemidiaphragm with chronic left lower
lung volume loss. Mild volume loss at the right lung base, probably
slightly increased above baseline.

## 2019-08-26 ENCOUNTER — Other Ambulatory Visit: Payer: Self-pay | Admitting: Allergy and Immunology

## 2019-08-31 ENCOUNTER — Ambulatory Visit: Payer: Self-pay

## 2019-09-01 ENCOUNTER — Ambulatory Visit (INDEPENDENT_AMBULATORY_CARE_PROVIDER_SITE_OTHER): Payer: Medicare Other

## 2019-09-01 ENCOUNTER — Other Ambulatory Visit: Payer: Self-pay

## 2019-09-01 DIAGNOSIS — J4540 Moderate persistent asthma, uncomplicated: Secondary | ICD-10-CM

## 2019-10-27 ENCOUNTER — Other Ambulatory Visit: Payer: Self-pay

## 2019-10-27 ENCOUNTER — Ambulatory Visit (INDEPENDENT_AMBULATORY_CARE_PROVIDER_SITE_OTHER): Payer: Medicare Other | Admitting: *Deleted

## 2019-10-27 DIAGNOSIS — J4540 Moderate persistent asthma, uncomplicated: Secondary | ICD-10-CM

## 2019-10-27 DIAGNOSIS — J4550 Severe persistent asthma, uncomplicated: Secondary | ICD-10-CM

## 2019-11-03 ENCOUNTER — Telehealth: Payer: Self-pay

## 2019-11-03 NOTE — Telephone Encounter (Signed)
Patient called to get your opinion her primary care's switch from Claritin to Xyzal. She was having ongoing dry mouth & sinusitis. Patient wanted to make sure it was okay with you for the change.

## 2019-11-03 NOTE — Telephone Encounter (Signed)
Please inform patient that any antihistamine may end up producing some degree of dry eye or dry mouth and she needs to see if she has problems as such while using Xyzal.  If she has been having problems with her airways she should come back to see [place] for further evaluation and treatment.

## 2019-11-03 NOTE — Telephone Encounter (Signed)
Called patient and advised. Patient verbalized understanding.  

## 2019-12-22 ENCOUNTER — Ambulatory Visit: Payer: Self-pay

## 2019-12-22 ENCOUNTER — Ambulatory Visit (INDEPENDENT_AMBULATORY_CARE_PROVIDER_SITE_OTHER): Payer: Medicare Other

## 2019-12-22 DIAGNOSIS — J4550 Severe persistent asthma, uncomplicated: Secondary | ICD-10-CM

## 2020-02-15 ENCOUNTER — Telehealth: Payer: Self-pay | Admitting: Pulmonary Disease

## 2020-02-16 ENCOUNTER — Other Ambulatory Visit: Payer: Self-pay

## 2020-02-16 ENCOUNTER — Ambulatory Visit (INDEPENDENT_AMBULATORY_CARE_PROVIDER_SITE_OTHER): Payer: Medicare Other

## 2020-02-16 DIAGNOSIS — J4540 Moderate persistent asthma, uncomplicated: Secondary | ICD-10-CM

## 2020-02-16 DIAGNOSIS — J4550 Severe persistent asthma, uncomplicated: Secondary | ICD-10-CM | POA: Diagnosis not present

## 2020-02-16 NOTE — Telephone Encounter (Signed)
I spoke with the pt and notified of recs per [facility] and she verbalized understanding and nothing further needed.

## 2020-02-16 NOTE — Telephone Encounter (Signed)
Spoke with the pt  She states back in 2017 had CT Chest that was done by Dr Ashok Cordia that mentioned a nodule on her liver  She just recently had a cardiac CT done at [facility] (can view report in [place]) and they are saying the nodule on her liver is 17 mm now  She wants to know how this compares with the size of the nodule from back in 2017  Beth, please advise thanks (former BQ pt)

## 2020-02-16 NOTE — Telephone Encounter (Signed)
Cystic area liver was present back on CT chest in 2018, no measurement given. Cardiac CT at [facility] showed left lobe liver cyst measuring 61mm. I would have her follow-up with her PCP regarding this.

## 2020-02-16 NOTE — Telephone Encounter (Signed)
Pt returning call.  [phone]

## 2020-02-16 NOTE — Telephone Encounter (Signed)
Tried calling the pt- no answer- LMTCB.

## 2020-03-03 ENCOUNTER — Telehealth: Payer: Self-pay | Admitting: Allergy and Immunology

## 2020-03-03 MED ORDER — LORATADINE 10 MG PO TABS: 5.0000 mg | ORAL_TABLET | Freq: Every day | ORAL | 1 refills | Status: DC | PRN

## 2020-03-03 NOTE — Telephone Encounter (Signed)
Patient called and would like to talk with a nurse about her allergy meds that may be making her blood pressure go up. [phone].

## 2020-03-03 NOTE — Telephone Encounter (Signed)
Called patient to let her know. Patient voiced understanding and asked if we could please call in a prescription.

## 2020-03-03 NOTE — Telephone Encounter (Signed)
Prescription has been sent in as requested. I let patient know this but she is calling her cardiologist as she has been on the loratadine before and is unsure why she was ever switched off. I spelled out the name of both medications for her and she is going to speak with them and call us back if needed.

## 2020-03-03 NOTE — Telephone Encounter (Signed)
Ok to try loratadine 10 mg- take 1/2 tablet to one tablet daily as needed. Thank you.

## 2020-03-03 NOTE — Telephone Encounter (Signed)
Ok to send loratadine 10 mg - take 1/2 tablet to 1 tablet once a day as needed. #30 with 1 refill.

## 2020-03-03 NOTE — Telephone Encounter (Signed)
Patient states that she is being followed by her heart doctor for hypertension. Yesterday it was 163/90. Patients cardiologist is wondering if xyzal could be the issue. Her doctor is wondering if she could switch from xyzal to loratadine 10mg .   Please advise.

## 2020-03-21 DIAGNOSIS — I2510 Atherosclerotic heart disease of native coronary artery without angina pectoris: Secondary | ICD-10-CM | POA: Insufficient documentation

## 2020-03-24 ENCOUNTER — Encounter (HOSPITAL_BASED_OUTPATIENT_CLINIC_OR_DEPARTMENT_OTHER): Payer: Self-pay

## 2020-03-24 ENCOUNTER — Emergency Department (HOSPITAL_BASED_OUTPATIENT_CLINIC_OR_DEPARTMENT_OTHER): Payer: Medicare Other

## 2020-03-24 ENCOUNTER — Other Ambulatory Visit: Payer: Self-pay

## 2020-03-24 DIAGNOSIS — Z96651 Presence of right artificial knee joint: Secondary | ICD-10-CM | POA: Insufficient documentation

## 2020-03-24 DIAGNOSIS — R462 Strange and inexplicable behavior: Secondary | ICD-10-CM | POA: Diagnosis not present

## 2020-03-24 DIAGNOSIS — J45909 Unspecified asthma, uncomplicated: Secondary | ICD-10-CM | POA: Insufficient documentation

## 2020-03-24 DIAGNOSIS — T50905A Adverse effect of unspecified drugs, medicaments and biological substances, initial encounter: Secondary | ICD-10-CM | POA: Diagnosis not present

## 2020-03-24 DIAGNOSIS — I10 Essential (primary) hypertension: Secondary | ICD-10-CM | POA: Insufficient documentation

## 2020-03-24 DIAGNOSIS — R519 Headache, unspecified: Secondary | ICD-10-CM | POA: Insufficient documentation

## 2020-03-24 DIAGNOSIS — R42 Dizziness and giddiness: Secondary | ICD-10-CM | POA: Insufficient documentation

## 2020-03-24 DIAGNOSIS — Z79899 Other long term (current) drug therapy: Secondary | ICD-10-CM | POA: Diagnosis not present

## 2020-03-24 LAB — CBC
HCT: 38.6 % (ref 36.0–46.0)
Hemoglobin: 12.6 g/dL (ref 12.0–15.0)
MCH: 30.3 pg (ref 26.0–34.0)
MCHC: 32.6 g/dL (ref 30.0–36.0)
MCV: 92.8 fL (ref 80.0–100.0)
Platelets: 184 10*3/uL (ref 150–400)
RBC: 4.16 MIL/uL (ref 3.87–5.11)
RDW: 14 % (ref 11.5–15.5)
WBC: 5.3 10*3/uL (ref 4.0–10.5)
nRBC: 0 % (ref 0.0–0.2)

## 2020-03-24 LAB — BASIC METABOLIC PANEL
Anion gap: 11 (ref 5–15)
BUN: 19 mg/dL (ref 8–23)
CO2: 23 mmol/L (ref 22–32)
Calcium: 8.8 mg/dL — ABNORMAL LOW (ref 8.9–10.3)
Chloride: 104 mmol/L (ref 98–111)
Creatinine, Ser: 0.59 mg/dL (ref 0.44–1.00)
GFR calc Af Amer: >60 mL/min (ref >60)
GFR calc non Af Amer: >60 mL/min (ref >60)
Glucose, Bld: 126 mg/dL — ABNORMAL HIGH (ref 70–99)
Potassium: 3.7 mmol/L (ref 3.5–5.1)
Sodium: 138 mmol/L (ref 135–145)

## 2020-03-24 LAB — TROPONIN I (HIGH SENSITIVITY): Troponin I (High Sensitivity): 5 ng/L (ref <18)

## 2020-03-24 NOTE — ED Triage Notes (Signed)
Dr Elonda Husky pt, took regular BP med + 2.5 amlodipine & lexapro 5mg  per MD instruction ~1930 today. Pt states extremely elevated BP @ home, BP 185/98 in triage. Denies chest pain, diaphoresis, or SOB. HA that she took tylenol for.

## 2020-03-25 ENCOUNTER — Emergency Department (HOSPITAL_BASED_OUTPATIENT_CLINIC_OR_DEPARTMENT_OTHER): Payer: Medicare Other

## 2020-03-25 ENCOUNTER — Emergency Department (HOSPITAL_BASED_OUTPATIENT_CLINIC_OR_DEPARTMENT_OTHER)
Admission: EM | Admit: 2020-03-25 | Discharge: 2020-03-25 | Disposition: A | Discharge status: Home / Self Care | Payer: Medicare Other | Attending: Emergency Medicine | Admitting: Emergency Medicine

## 2020-03-25 DIAGNOSIS — T50905A Adverse effect of unspecified drugs, medicaments and biological substances, initial encounter: Secondary | ICD-10-CM

## 2020-03-25 DIAGNOSIS — I10 Essential (primary) hypertension: Secondary | ICD-10-CM

## 2020-03-25 LAB — TROPONIN I (HIGH SENSITIVITY): Troponin I (High Sensitivity): 6 ng/L (ref <18)

## 2020-03-25 NOTE — ED Provider Notes (Signed)
[facility] DE[facility] Provider Note: Georgena Spurling, MD, FACEP  CSN: [id number] MRN: [id number] ARRIVAL: 03/24/20 at 2215 ROOM: [place]  Hypertension   HISTORY OF PRESENT ILLNESS  03/25/20 3:59 AM Cassandra Sims is a 86 y.o. female with a history of hypertension.  She states she has been working with her cardiologist, Dr. Elonda Husky, for the last 3 weeks trying to get her medications adjusted.  She has had dosage changes to her amlodipine because to higher dose causes hypotension to lower the dose does not adequately control her blood pressure.  She states she has also been on labetalol.  She states when her blood pressure goes up she feels "weird".  She is here because she felt "weird" yesterday evening and came to the ED.  Her blood pressure at home was 190/100, it was 185/98 here, and most recently 152/85.  She had a headache, primarily posteriorly, which she rated as a 5 out of 10, which improved but did not completely abate with Tylenol.  She states she has blurred vision but this has been present for at least the past 3 weeks.  She also states that she has been walking with a staggering gait for at least the past 3 months and this is part of what she means by "feeling weird" but there is also an element of lightheadedness.   She states she took her usual 2.5 mg of amlodipine, 100 mg of labetalol, and 40 mg of Benicar yesterday evening along with restarting her Lexapro 5 mg which she had been off for 2 months.    She denies any chest pain, shortness of breath (apart from her baseline asthma), focal weakness or focal numbness.  As noted above she has felt her gait was staggering for the past several months.  Past Medical History:  Diagnosis Date  . Anxiety   . Arthritis   . Asthma   . Complication of anesthesia    hard to wake up, 2009  sit up in bed to fast and BP dropped low code blue called per pt.  . Family history of adverse reaction to anesthesia    sister has  [place]  . GERD (gastroesophageal reflux disease)   . Glaucoma   . Headache    hx of migrains  . HOH (hard of hearing)   . Hypertension   . Hypoglycemia   . Multiple allergies   . Peripheral vascular disease (HCC)    varicose veins , Laser vein surgery  . PONV (postoperative nausea and vomiting)   . Pre-diabetes   . Prolonged QT interval 01/13/2018  . Rhinitis   . S/P knee replacement 01/13/2018  . Wears glasses     Past Surgical History:  Procedure Laterality Date  . ABDOMINAL HYSTERECTOMY    . APPENDECTOMY    . arthroscopy right knee    . BREAST SURGERY     biopsy left   . COLECTOMY  2008   obstruction  . DIAGNOSTIC LAPAROSCOPY     exp  . DILATION AND CURETTAGE OF UTERUS    . EYE SURGERY     cataract   bil and retina surgery right eye, glaucoma surgery, right eye  . FUNCTIONAL ENDOSCOPIC SINUS SURGERY  2009   bilat ethm,frontal,max  . JOINT REPLACEMENT     right total knee arthroplasty 01-05-18 aluisio  . KNEE ARTHROSCOPY     right with synovectomy Dr. Wynelle Link 03-30-18   . KNEE ARTHROSCOPY Right 03/30/2018   Procedure: right knee  scope; synovectomy;  Surgeon: Gaynelle Arabian, MD;  Location: [facility];  Service: Orthopedics;  Laterality: Right;  9min  . NASAL SINUS SURGERY Bilateral 10/26/2012   Procedure: BILATERAL ENDOSCOPIC REVISION ETHMOID MAXILLARY AND FRONTAL SINUS SURGERY  ;  Surgeon: Izora Gala, MD;  Location: [facility];  Service: ENT;  Laterality: Bilateral;  . OVARIAN CYST SURGERY    . TOTAL KNEE ARTHROPLASTY Right 01/05/2018   Procedure: RIGHT TOTAL KNEE ARTHROPLASTY;  Surgeon: Gaynelle Arabian, MD;  Location: [facility];  Service: Orthopedics;  Laterality: Right;  . TUBAL LIGATION    . URETER SURGERY     cut accidentally during exp lap-ovarien cyst  . VEIN LIGATION      Family History  Problem Relation Age of Onset  . Glaucoma Mother   . Arthritis Mother   . Lung disease Sister   . Breast cancer Daughter   . Diabetes Mellitus I Other   . Allergic  rhinitis Neg Hx   . Angioedema Neg Hx   . Asthma Neg Hx   . Atopy Neg Hx   . Eczema Neg Hx   . Urticaria Neg Hx   . Immunodeficiency Neg Hx     Social History   Tobacco Use  . Smoking status: Never Smoker  . Smokeless tobacco: Never Used  Vaping Use  . Vaping Use: Never used  Substance Use Topics  . Alcohol use: No  . Drug use: No    Prior to Admission medications   Medication Sig Start Date End Date Taking? Authorizing Provider  acetaminophen (TYLENOL) 500 MG tablet Take 1,000 mg by mouth every 8 (eight) hours as needed for mild pain.    [provider]  albuterol (VENTOLIN HFA) 108 (90 Base) MCG/ACT inhaler Inhale 2 puffs into the lungs every 4 (four) hours as needed for wheezing. Reported on 10/10/2015 07/20/19   Jiles Prows, MD  amLODipine (NORVASC) 5 MG tablet Take 5 mg by mouth daily.  01/29/17   [provider]  Azelastine HCl 0.15 % SOLN Place 1 spray into both nostrils 2 (two) times daily. 05/10/19   Kozlow, Donnamarie Poag, MD  Benralizumab (FASENRA) 30 MG/ML SOSY Inject 30 mg into the skin every 8 (eight) weeks.    [provider]  Calcium Carb-Cholecalciferol (CALTRATE 600+D3 PO) Take 1 tablet by mouth daily.    [provider]  carboxymethylcellulose (REFRESH TEARS) 0.5 % SOLN Place 1 drop into both eyes 5 (five) times daily as needed (for dry eyes).    [provider]  DULoxetine (CYMBALTA) 20 MG capsule Take 20 mg by mouth once. 06/07/19   [provider]  famotidine (PEPCID) 40 MG tablet TAKE 1 TABLET BY MOUTH EVERY DAY 08/02/19   Kozlow, Donnamarie Poag, MD  fexofenadine (ALLEGRA) 180 MG tablet TAKE 1 TABLET BY MOUTH EVERY DAY 01/04/19   Kozlow, Donnamarie Poag, MD  fluticasone (FLONASE) 50 MCG/ACT nasal spray Use one spray in each nostril twice daily as directed. 06/16/18   Kozlow, Donnamarie Poag, MD  fluticasone (FLONASE) 50 MCG/ACT nasal spray Place 1 spray into both nostrils 2 (two) times daily. 07/20/19   Kozlow, Donnamarie Poag, MD  hydrALAZINE  (APRESOLINE) 10 MG tablet Take 10 mg by mouth once. 06/11/19   [provider]  Lactobacillus (ACIDOPHILUS PO) Take 1 capsule by mouth 3 (three) times a week.     [provider]  loratadine (CLARITIN) 10 MG tablet Take 0.5-1 tablets (5-10 mg total) by mouth daily as needed for allergies. 03/03/20  Althea Charon, FNP  Misc Natural Products (LUTEIN 20) CAPS Take 20 mg by mouth daily with lunch.     [provider]  mometasone (ELOCON) 0.1 % ointment APPLY TO AFFECTED AREA TWICE A DAY 08/26/19   Kozlow, Donnamarie Poag, MD  mometasone-formoterol [facility]) 100-5 MCG/ACT AERO Take 2 puffs first thing in am and then another 2 puffs about 12 hours later. Patient taking differently: Inhale 2 puffs into the lungs 2 (two) times daily as needed. Take 2 puffs first thing in am and then another 2 puffs about 12 hours later. 03/17/18   Kozlow, Donnamarie Poag, MD  montelukast (SINGULAIR) 10 MG tablet Take 10 mg by mouth at bedtime.    [provider]  montelukast (SINGULAIR) 10 MG tablet Take 1 tablet (10 mg total) by mouth at bedtime. 07/20/19   Kozlow, Donnamarie Poag, MD  Multiple Vitamin (MULTIVITAMIN WITH MINERALS) TABS tablet Take 1 tablet by mouth daily. Nature Made Multi Vitamin for Her 50+ W/No Iron    [provider]  mupirocin ointment (BACTROBAN) 2 % Place 1 application into the nose 3 (three) times daily. 07/20/19   Kozlow, Donnamarie Poag, MD  pimecrolimus (ELIDEL) 1 % cream Apply 1-2 times daily as directed. 07/21/19   Kozlow, Donnamarie Poag, MD  psyllium (METAMUCIL) 58.6 % powder Take 1 packet by mouth daily. 14.3 g (1 heaping tablespoon) every morning.    [provider]  telmisartan (MICARDIS) 80 MG tablet Take 1 tablet (80 mg total) by mouth daily. 01/16/18   Eugenie Filler, MD  telmisartan (MICARDIS) 80 MG tablet Take 80 mg by mouth once. 06/08/19   [provider]  triamcinolone (KENALOG) 0.025 % ointment Apply 1 application topically 2 (two) times daily.    [provider]  Turmeric 450 MG CAPS Take 450 mg by mouth daily with lunch.     [provider]    Allergies Penicillins, Adhesive [tape], Clindamycin/lincomycin, Codeine, Darvocet [propoxyphene n-acetaminophen], Dilaudid [hydromorphone hcl], Flagyl [metronidazole], Lactose intolerance (gi), Levofloxacin, Macrodantin [nitrofurantoin macrocrystal], Morphine and related, Premarin [conjugated estrogens], Tramadol hcl, Vancomycin, Zantac [ranitidine hcl], Zofran [ondansetron hcl], Denosumab, Nitrofuran derivatives, and Sulfa antibiotics   REVIEW OF SYSTEMS  Negative except as noted here or in the History of Present Illness.   PHYSICAL EXAMINATION  Initial Vital Signs Blood pressure (!) 157/100, pulse 63, temperature 98.5 F (36.9 C), temperature source Oral, resp. rate 20, height 5\' 3"  (1.6 m), weight 49.9 kg, SpO2 98 %.  Examination General: Well-developed, well-nourished female in no acute distress; appearance consistent with age of record HENT: normocephalic; atraumatic Eyes: pupils equal, round and reactive to light; extraocular muscles intact Neck: supple Heart: regular rate and rhythm Lungs: clear to auscultation bilaterally Abdomen: soft; nondistended; nontender; bowel sounds present Extremities: No deformity; full range of motion; pulses normal Neurologic: Awake, alert and oriented; motor function intact in all extremities and symmetric; no facial droop Skin: Warm and dry Psychiatric: Normal mood and affect   RESULTS  Summary of this visit's results, reviewed and interpreted by myself:   EKG Interpretation  Date/Time:  Friday March 24 2020 22:32:41 EDT Ventricular Rate:  63 PR Interval:  164 QRS Duration: 86 QT Interval:  406 QTC Calculation: 415 R Axis:   57 Text Interpretation: Normal sinus rhythm No significant change was found Confirmed by Branston Halsted [phone]) on 03/25/2020 3:59:06 AM      Laboratory Studies: Results for orders placed or performed  during the hospital encounter of 03/25/20 (from the past 24 hour(s))  Basic metabolic panel     Status: Abnormal   Collection Time: 03/24/20 10:36 PM  Result Value Ref Range   Sodium 138 135 - 145 mmol/L   Potassium 3.7 3.5 - 5.1 mmol/L   Chloride 104 98 - 111 mmol/L   CO2 23 22 - 32 mmol/L   Glucose, Bld 126 (H) 70 - 99 mg/dL   BUN 19 8 - 23 mg/dL   Creatinine, Ser 0.59 0.44 - 1.00 mg/dL   Calcium 8.8 (L) 8.9 - 10.3 mg/dL   GFR calc non Af Amer >60 >60 mL/min   GFR calc Af Amer >60 >60 mL/min   Anion gap 11 5 - 15  CBC     Status: None   Collection Time: 03/24/20 10:36 PM  Result Value Ref Range   WBC 5.3 4.0 - 10.5 K/uL   RBC 4.16 3.87 - 5.11 MIL/uL   Hemoglobin 12.6 12.0 - 15.0 g/dL   HCT 38.6 36 - 46 %   MCV 92.8 80.0 - 100.0 fL   MCH 30.3 26.0 - 34.0 pg   MCHC 32.6 30.0 - 36.0 g/dL   RDW 14.0 11.5 - 15.5 %   Platelets 184 150 - 400 K/uL   nRBC 0.0 0.0 - 0.2 %  Troponin I (High Sensitivity)     Status: None   Collection Time: 03/24/20 10:36 PM  Result Value Ref Range   Troponin I (High Sensitivity) 5 <18 ng/L  Troponin I (High Sensitivity)     Status: None   Collection Time: 03/25/20  3:37 AM  Result Value Ref Range   Troponin I (High Sensitivity) 6 <18 ng/L   Imaging Studies: DG Chest 2 View  Result Date: 03/24/2020 CLINICAL DATA:  Hypertension EXAM: CHEST - 2 VIEW COMPARISON:  July 20, 2019 FINDINGS: The heart size and mediastinal contours are within normal limits. Both lungs are clear. Stable elevation of the left hemidiaphragm is seen. IMPRESSION: No active cardiopulmonary disease. Electronically Signed   By: Prudencio Pair M.D.   On: 03/24/2020 23:38   CT Head Wo Contrast  Result Date: 03/25/2020 CLINICAL DATA:  Dizziness, hypertension EXAM: CT HEAD WITHOUT CONTRAST TECHNIQUE: Contiguous axial images were obtained from the base of the skull through the vertex without intravenous contrast. COMPARISON:  06/14/2019 FINDINGS: Brain: No evidence of acute infarction,  hemorrhage, hydrocephalus, extra-axial collection or mass lesion/mass effect. Subcortical white matter and periventricular small vessel ischemic changes. Vascular: No hyperdense vessel or unexpected calcification. Skull: Normal. Negative for fracture or focal lesion. Sinuses/Orbits: The visualized paranasal sinuses are essentially clear. The mastoid air cells are unopacified. Postsurgical changes involving the right orbit. Other: None. IMPRESSION: No evidence of acute intracranial abnormality. Electronically Signed   By: Julian Hy M.D.   On: 03/25/2020 05:13    ED COURSE and MDM  Nursing notes, initial and subsequent vitals signs, including pulse oximetry, reviewed and interpreted by myself.  Vitals:   03/25/20 0336 03/25/20 0400 03/25/20 0430 03/25/20 0500  BP: (!) 157/100 (!) 152/85 (!) 161/84 (!) 165/89  Pulse: 63 (!) 59 63 60  Resp: 20 17 18 18   Temp:      TempSrc:      SpO2: 98% 96% 95% 97%  Weight:      Height:       Medications - No data to display  5:43 AM Patient's blood pressure is 165/89 most recently.  The patient states she gets these "weird"/lightheaded episodes every evening but the symptoms do usually improve after her antihypertensives, although  the symptoms did not improve yesterday evening as well as she expected.  She is concerned this may be due to restarting her Lexapro.  She was advised she could start take half of her Lexapro in the morning and half of her Lexapro in the evening.  She was advised of the reassuring work-up in the ED and that she will need to contact Dr. Elonda Husky for further medication management.  PROCEDURES  Procedures   ED DIAGNOSES     ICD-10-CM   1. Hypertension not at goal  I10   2. Adverse effect of drug, initial encounter  T50.905A        Laini Urick, Jenny Reichmann, MD 03/25/20 [phone]

## 2020-04-12 ENCOUNTER — Ambulatory Visit (INDEPENDENT_AMBULATORY_CARE_PROVIDER_SITE_OTHER): Payer: Medicare Other

## 2020-04-12 DIAGNOSIS — J4550 Severe persistent asthma, uncomplicated: Secondary | ICD-10-CM | POA: Diagnosis not present

## 2020-06-07 ENCOUNTER — Ambulatory Visit (INDEPENDENT_AMBULATORY_CARE_PROVIDER_SITE_OTHER): Payer: Medicare Other

## 2020-06-07 DIAGNOSIS — J4550 Severe persistent asthma, uncomplicated: Secondary | ICD-10-CM

## 2020-07-10 ENCOUNTER — Telehealth: Payer: Self-pay

## 2020-07-10 MED ORDER — OLOPATADINE HCL 0.6 % NA SOLN: 1.0000 | Freq: Two times a day (BID) | NASAL | 5 refills | Status: DC | PRN

## 2020-07-10 NOTE — Telephone Encounter (Signed)
Patient called stating the PCP took her off all of her antihistamines due to her having dry eyes Since being off the antihistamines her dry eyes are better. Patient is still having a lot of sneezing even with taking Flonase. Patient is wondering what else she can take for help with the sneezing?   [facility]   [phone]

## 2020-07-10 NOTE — Telephone Encounter (Signed)
She can try nasal azelastine

## 2020-07-10 NOTE — Telephone Encounter (Signed)
Spoke to patient and informed her of the Azelastine 1 spray each nostril 1-2 times daily as needed. Patient said she has that medication and it didn't work so she stopped using it.

## 2020-07-10 NOTE — Telephone Encounter (Signed)
Patient insurance is need a PA for Olopatadine nasal sprince since patient needs to try and fail Nasacort and Azelastine. Will need to attempt PA as soon as we can.

## 2020-07-10 NOTE — Telephone Encounter (Signed)
She can try nasal azelastine -1 spray each nostril 1-2 times per day if needed

## 2020-07-10 NOTE — Telephone Encounter (Signed)
Can try olopatadine nasal spray-1-2 sprays each nostril 1-2 times per day

## 2020-07-11 NOTE — Telephone Encounter (Signed)
Prior authorization submitted on covermymeds.

## 2020-07-11 NOTE — Telephone Encounter (Signed)
Patient says [employer] just called here stating the olopatadine is not covered by insurance. PT also states that she does not use [facility], [facility] [place]. Advise PA was sent through covermymeds but will make sure it was sent to correct pharmacy.

## 2020-07-11 NOTE — Telephone Encounter (Signed)
Most likely [employer] is the patient's insurance company in charge of her prescription coverage. [facility]. Will initiate PA and advised the patient on the status.

## 2020-07-12 NOTE — Telephone Encounter (Signed)
Noted. Will follow up on PA.

## 2020-07-12 NOTE — Telephone Encounter (Signed)
I submitted a PA yesterday on this one Cassandra Sims. I have not checked on determination yet. You are correct about the prescription benefit manager.

## 2020-07-13 NOTE — Telephone Encounter (Signed)
PA is still pending.  

## 2020-07-18 NOTE — Telephone Encounter (Signed)
Patient called again to check on the status of PA because she was about to head to the drug store. Please advise.

## 2020-07-18 NOTE — Telephone Encounter (Signed)
Patient called asking about nasal spray.  Please update. Thank you.

## 2020-07-18 NOTE — Telephone Encounter (Signed)
Patient called back to determine the status of PA. I had a nurse look on covermymeds to determine status was denied. Patient informed she could purchase prescription over the counter or a nurse could look into an appeal or an alternative medication that is covered by insurance. Patient would like an appeal if possible since she has tried everything else. Please let patient know of alternatives.

## 2020-07-18 NOTE — Telephone Encounter (Signed)
Called and spoke to patient to inform her that we would start an appeal process. [facility] to see what [facility] insurance would cover. The pharmacist told me that her insurance will only cover azelastine and flunisolide. Looking through her chart it seems that she had triad and failed the azelastine and flunisolide.

## 2020-07-20 ENCOUNTER — Telehealth: Payer: Self-pay

## 2020-07-20 NOTE — Telephone Encounter (Signed)
Initiated PA through CoverMyMeds for Olopatadine nasal spray. Patient has tried and failed Astelin, FLonase, Afrin, Dymista, and Nasonex. Will see if there is a fax determination that may also provide an appeal option if denied again. Will follow up on this on Monday.

## 2020-07-20 NOTE — Telephone Encounter (Signed)
Note was done in error.

## 2020-07-20 NOTE — Telephone Encounter (Signed)
There is no conversation attached to this note.  Was there a conversation that was to be forwarded?

## 2020-07-24 NOTE — Telephone Encounter (Signed)
More clinical information was required on form sent to our office from [employer]. Complete form and faxed back to [phone]

## 2020-07-25 NOTE — Telephone Encounter (Signed)
PA for Olopatadine 0.6 % was approved until 07/21/2021

## 2020-07-25 NOTE — Telephone Encounter (Signed)
PA has been faxed to pharmacy. Called and advised to the patient. Patient verbalized understanding and will call back if she needs anything further.

## 2020-08-01 ENCOUNTER — Telehealth: Payer: Self-pay

## 2020-08-01 NOTE — Telephone Encounter (Signed)
Lets have her try a combination of Flonase 1 spray and Patanase 1 spray each nostril 1 time per day to see if this helps with congestion and sneezing.  He will take several days to see result.

## 2020-08-01 NOTE — Telephone Encounter (Signed)
Left a message for Cassandra Sims to call back

## 2020-08-01 NOTE — Telephone Encounter (Signed)
Patient has been using the new olopatadine nose spray and has noticed that it has helped with her sneezing but she is still very stopped up.  She did stop the Flonase when she started the olopatadine.  She did have one time when she blew a little bloody nasal discharge and some blood in post nasal drip.  Please advise.

## 2020-08-01 NOTE — Telephone Encounter (Signed)
Patient informed. 

## 2020-08-02 ENCOUNTER — Ambulatory Visit (INDEPENDENT_AMBULATORY_CARE_PROVIDER_SITE_OTHER): Payer: Medicare Other | Admitting: *Deleted

## 2020-08-02 ENCOUNTER — Other Ambulatory Visit: Payer: Self-pay

## 2020-08-02 DIAGNOSIS — J4540 Moderate persistent asthma, uncomplicated: Secondary | ICD-10-CM

## 2020-08-02 DIAGNOSIS — J4550 Severe persistent asthma, uncomplicated: Secondary | ICD-10-CM

## 2020-08-13 ENCOUNTER — Other Ambulatory Visit: Payer: Self-pay | Admitting: Allergy and Immunology

## 2020-08-14 MED ORDER — FLUTICASONE PROPIONATE 50 MCG/ACT NA SUSP: 1.0000 | Freq: Two times a day (BID) | NASAL | 0 refills | Status: DC

## 2020-08-14 NOTE — Addendum Note (Signed)
Addended by: Valere Dross on: 08/14/2020 11:37 AM   Modules accepted: Orders

## 2020-09-07 ENCOUNTER — Other Ambulatory Visit: Payer: Self-pay | Admitting: Allergy and Immunology

## 2020-09-11 DIAGNOSIS — E559 Vitamin D deficiency, unspecified: Secondary | ICD-10-CM | POA: Insufficient documentation

## 2020-09-19 ENCOUNTER — Telehealth: Payer: Self-pay | Admitting: Pulmonary Disease

## 2020-09-20 NOTE — Telephone Encounter (Signed)
Call returned to patient, confirmed DOB. Made aware the nodule was in her left lower lobe. Voiced understanding.   Nothing further needed at this time.

## 2020-09-26 ENCOUNTER — Telehealth: Payer: Self-pay | Admitting: Allergy

## 2020-09-26 NOTE — Progress Notes (Signed)
Follow Up Note  RE: Cassandra Sims MRN: [id number] DOB: [birth year] Date of Office Visit: 09/27/2020  Referring provider: Patrecia Pour, Christean Grief, MD Primary care provider: Patrecia Pour, Christean Grief, MD  Chief Complaint: Allergic Rhinitis  and Asthma  History of Present Illness: I had the pleasure of seeing Cassandra Sims for a follow up visit at the [facility] [place] on 09/27/2020. She is a 87 y.o. female, who is being followed for asthma on Fasenra every 8 weeks, allergic rhinitis, atopic dermatitis. Her previous allergy office visit was on 07/20/2019 with Dr. Neldon Mc. Today is a regular follow up visit. Failed to follow up as recommended.  Asthma: Patient has been on Fasenra injections every 8 weeks at least since 2019 Sometimes she still has a dry hacking cough and shortness of breath.   Denies any ER/urgent care visits or prednisone use since the last visit.  Patient follows with pulmonology but has not seen them for over 2 years. Currently not using Dulera daily only using Dulera once every 2 weeks when the coughing get really bad. She is not sure when she stopped using the [facility] daily.  Not using any albuterol.  03/24/2020 CXR: IMPRESSION: No active cardiopulmonary disease.  Rhinitis: Having issues with nasal congestion. Taking Flonase 1 spray per nostril QHS with no benefit. No nosebleeds. Not taking Singulair.   Patient does have dry mouth/dry eyes due to breathing through her mouth.   Reflux: Currently taking tums only with good benefit. Not sure when she stopped famotidine.   Assessment and Plan: Cassandra Sims is a 87 y.o. female with: Moderate persistent asthma without complication No follow up for over 1 year. Complaining of dry hacking cough and shortness of breath. Stopped Singulair and stopped using Singulair daily. No pulmonology follow up for over 2 years. Patient was not sure what Fasenra injection was used for.  Today's spirometry was normal.   Continue  Fasenra injections every 8 weeks - scheduled for tomorrow as patient received a new injection for osteoporosis yesterday. . Daily controller medication(s): RESTART Dulera 135mcg 1 puff daily with spacer and rinse mouth after each use.  o Let us know if it's too expensive.  . During upper respiratory infections/asthma flares: start Dulera 14mcg 2 puffs twice a day with spacer and rinse mouth afterwards for 1-2 weeks until your breathing symptoms return to baseline.  . May use albuterol rescue inhaler 2 puffs every 4 to 6 hours as needed for shortness of breath, chest tightness, coughing, and wheezing. May use albuterol rescue inhaler 2 puffs 5 to 15 minutes prior to strenuous physical activities. Monitor frequency of use.  . Repeat spirometry at next visit. . Stressed importance of keeping follow up visits.   Chronic rhinitis Past history - 2018 skin testing negative to environmental allergies. Interim history - nasal congestion present, has issues with dry mouth.  Start Xhance 1 spray per nostril twice a day. Sample given. Demonstrated proper use.   Let us know if this is not covered.  This replaces Flonase.   No antihistamines given dry mouth/dry eye issues.   GERD (gastroesophageal reflux disease) Stopped famotidine and not sure when. Only takes tums prn.  Continue dietary and lifestyle modifications.  Return in about 2 months (around 11/27/2020).  Meds ordered this encounter  Medications  . mometasone-formoterol (DULERA) 100-5 MCG/ACT AERO    Sig: Inhale 1 puff into the lungs daily. with spacer and rinse mouth afterwards.    Dispense:  1 each    Refill:  5  . Fluticasone Propionate (XHANCE) 93 MCG/ACT EXHU    Sig: Place 1 spray into the nose in the morning and at bedtime. 2 sprays per nostril twice daily for stuffy nose    Dispense:  16 mL    Refill:  5   Lab Orders  No laboratory test(s) ordered today    Diagnostics: Spirometry:  Tracings reviewed. Her effort: Good  reproducible efforts. FVC: 1.88L FEV1: 1.40L, 94% predicted FEV1/FVC ratio: 74% Interpretation: Spirometry consistent with normal pattern.  Please see scanned spirometry results for details.  Medication List:  Current Outpatient Medications  Medication Sig Dispense Refill  . acetaminophen (TYLENOL) 500 MG tablet Take 1,000 mg by mouth every 8 (eight) hours as needed for mild pain.    Marland Kitchen albuterol (VENTOLIN HFA) 108 (90 Base) MCG/ACT inhaler Inhale 2 puffs into the lungs every 4 (four) hours as needed for wheezing. Reported on 10/10/2015 18 g 2  . amLODipine (NORVASC) 2.5 MG tablet Take by mouth.    Marland Kitchen aspirin 81 MG EC tablet Take by mouth.    . Azelastine HCl 0.15 % SOLN Place 1 spray into both nostrils 2 (two) times daily. 90 mL 1  . Benralizumab 30 MG/ML SOSY Inject 30 mg into the skin every 8 (eight) weeks.    . bimatoprost (LUMIGAN) 0.03 % ophthalmic solution 1 drop at bedtime.    . Calcium Carbonate Antacid (TUMS PO) Take by mouth. Take 2 tums twice daily.    . carboxymethylcellulose (REFRESH PLUS) 0.5 % SOLN Place 1 drop into both eyes 5 (five) times daily as needed (for dry eyes).    Marland Kitchen denosumab (PROLIA) 60 MG/ML SOSY injection Inject into the skin.    Marland Kitchen dorzolamide (TRUSOPT) 2 % ophthalmic solution Place 1 drop into both eyes 2 times daily.    . Fluticasone Propionate (XHANCE) 93 MCG/ACT EXHU Place 1 spray into the nose in the morning and at bedtime. 2 sprays per nostril twice daily for stuffy nose 16 mL 5  . labetalol (NORMODYNE) 100 MG tablet Take 1 tablet by mouth 2 (two) times daily.    . Lactobacillus (ACIDOPHILUS PO) Take 1 capsule by mouth 3 (three) times a week.     . LUTEIN PO Take 20 mg by mouth daily.    . mometasone-formoterol (DULERA) 100-5 MCG/ACT AERO Inhale 1 puff into the lungs daily. with spacer and rinse mouth afterwards. 1 each 5  . Multiple Vitamin (MULTIVITAMIN WITH MINERALS) TABS tablet Take 1 tablet by mouth daily. Nature Made Multi Vitamin for Her 50+ W/No Iron     . mupirocin ointment (BACTROBAN) 2 % Place 1 application into the nose 3 (three) times daily. 22 g 0  . olmesartan (BENICAR) 40 MG tablet Take 1 tablet by mouth daily.    . psyllium (METAMUCIL) 58.6 % powder Take 1 packet by mouth daily. 14.3 g (1 heaping tablespoon) every morning.    . triamcinolone (KENALOG) 0.025 % ointment Apply 1 application topically 2 (two) times daily.    . Turmeric 450 MG CAPS Take 450 mg by mouth daily with lunch.      Current Facility-Administered Medications  Medication Dose Route Frequency Provider Last Rate Last Admin  . Benralizumab SOSY 30 mg  30 mg Subcutaneous Q8 Burna Forts, MD   30 mg at 08/02/20 8182   Allergies: Allergies  Allergen Reactions  . Penicillins Shortness Of Breath and Swelling    Has patient had a PCN reaction causing immediate rash, facial/tongue/throat swelling, SOB or  lightheadedness with hypotension: Yes Has patient had a PCN reaction causing severe rash involving mucus membranes or skin necrosis: No Has patient had a PCN reaction that required hospitalization: No Has patient had a PCN reaction occurring within the last 10 years: No If all of the above answers are "NO", then may proceed with Cephalosporin use.   Marland Kitchen Apraclonidine Other (See Comments)    Very light sensitive, swollen eye  . Brinzolamide Other (See Comments)  . Lactose Other (See Comments)  . Acetaminophen   . Adhesive [Tape] Itching    BAND-AID TAPE-skin redness/itching  . Clindamycin/Lincomycin Other (See Comments)    Severe joint pain.  . Codeine Nausea And Vomiting  . Darvocet [Propoxyphene N-Acetaminophen] Other (See Comments)    HTN  . Dilaudid [Hydromorphone Hcl] Nausea And Vomiting    DROP BLOOD PRESSURE HYPOTENSIVE  . Flagyl [Metronidazole] Other (See Comments)    Joint pains  . Lactose Intolerance (Gi)   . Levofloxacin     Joint pain  . Macrodantin [Nitrofurantoin Macrocrystal]   . Morphine And Related     hallucinate  . Other   .  Pedi-Pre Tape Spray [Wound Dressing Adhesive]   . Premarin [Conjugated Estrogens] Itching  . Tramadol Hcl Other (See Comments)    Causes insomnia  . Vancomycin Hives and Itching  . Zantac [Ranitidine Hcl]     Itching   . Zofran [Ondansetron Hcl]     HTN  . Clarithromycin     Other reaction(s): Abdominal Pain Anxious/nervous feeling  . Denosumab Rash  . Nitrofuran Derivatives Rash  . Sulfa Antibiotics Rash   I reviewed her past medical history, social history, family history, and environmental history and no significant changes have been reported from her previous visit.  Review of Systems  Constitutional: Negative for appetite change, fever and unexpected weight change.  HENT: Positive for congestion. Negative for rhinorrhea.   Eyes: Negative for itching.  Respiratory: Positive for cough and shortness of breath. Negative for chest tightness and wheezing.   Gastrointestinal: Negative for abdominal pain.  Skin: Negative for rash.  Neurological: Negative for headaches.   Objective: BP 108/64 (BP Location: Left Arm, Patient Position: Sitting, Cuff Size: Normal)   Pulse 60   Temp 97.8 F (36.6 C) (Tympanic)   Resp 20   Ht 5\' 1"  (1.549 m)   Wt 105 lb 3.2 oz (47.7 kg)   SpO2 98%   BMI 19.88 kg/m  Body mass index is 19.88 kg/m. Physical Exam Vitals and nursing note reviewed.  Constitutional:      Appearance: Normal appearance. She is well-developed.  HENT:     Head: Normocephalic and atraumatic.     Right Ear: Tympanic membrane and external ear normal.     Left Ear: Tympanic membrane and external ear normal.     Nose: Nose normal.     Mouth/Throat:     Mouth: Mucous membranes are moist.     Pharynx: Oropharynx is clear.  Eyes:     Conjunctiva/sclera: Conjunctivae normal.  Cardiovascular:     Rate and Rhythm: Normal rate and regular rhythm.     Heart sounds: Normal heart sounds. No murmur heard.   Pulmonary:     Effort: Pulmonary effort is normal.     Breath  sounds: Normal breath sounds. No wheezing, rhonchi or rales.  Musculoskeletal:     Cervical back: Neck supple.  Skin:    General: Skin is warm.     Findings: No rash.  Neurological:  General: No focal deficit present.     Mental Status: She is alert.    Previous notes and tests were reviewed. The plan was reviewed with the patient/family, and all questions/concerned were addressed.  It was my pleasure to see Cassandra Sims today and participate in her care. Please feel free to contact me with any questions or concerns.  Sincerely,  Rexene Alberts, DO Allergy & Immunology  [facility] office: [place] office: [phone]

## 2020-09-26 NOTE — Telephone Encounter (Signed)
I left a voicemail to remind the patient of her appointment and location. I also moved her biologic injection to tomorrow instead of Thursday so the patient wouldn't have to travel to our office twice this week. [place].  Thanks

## 2020-09-26 NOTE — Telephone Encounter (Signed)
Please call patient and make sure she is aware that her tomorrow's appointment is at the [facility] office - she always goes to [facility] office.

## 2020-09-27 ENCOUNTER — Telehealth: Payer: Self-pay | Admitting: Allergy

## 2020-09-27 ENCOUNTER — Ambulatory Visit: Payer: Medicare Other | Admitting: Allergy

## 2020-09-27 ENCOUNTER — Other Ambulatory Visit: Payer: Self-pay

## 2020-09-27 ENCOUNTER — Ambulatory Visit: Payer: Self-pay | Admitting: *Deleted

## 2020-09-27 ENCOUNTER — Ambulatory Visit: Payer: Self-pay

## 2020-09-27 ENCOUNTER — Encounter: Payer: Self-pay | Admitting: Allergy

## 2020-09-27 VITALS — BP 108/64 | HR 60 | Temp 97.8°F | Resp 20 | Ht 61.0 in | Wt 105.2 lb

## 2020-09-27 DIAGNOSIS — M3500 Sicca syndrome, unspecified: Secondary | ICD-10-CM

## 2020-09-27 DIAGNOSIS — R12 Heartburn: Secondary | ICD-10-CM | POA: Insufficient documentation

## 2020-09-27 DIAGNOSIS — M2041 Other hammer toe(s) (acquired), right foot: Secondary | ICD-10-CM | POA: Insufficient documentation

## 2020-09-27 DIAGNOSIS — J310 Chronic rhinitis: Secondary | ICD-10-CM | POA: Diagnosis not present

## 2020-09-27 DIAGNOSIS — J4540 Moderate persistent asthma, uncomplicated: Secondary | ICD-10-CM | POA: Insufficient documentation

## 2020-09-27 DIAGNOSIS — K219 Gastro-esophageal reflux disease without esophagitis: Secondary | ICD-10-CM

## 2020-09-27 DIAGNOSIS — M2042 Other hammer toe(s) (acquired), left foot: Secondary | ICD-10-CM | POA: Insufficient documentation

## 2020-09-27 MED ORDER — XHANCE 93 MCG/ACT NA EXHU: 1.0000 | INHALANT_SUSPENSION | Freq: Two times a day (BID) | NASAL | 5 refills | Status: DC

## 2020-09-27 MED ORDER — MOMETASONE FURO-FORMOTEROL FUM 100-5 MCG/ACT IN AERO: 1.0000 | INHALATION_SPRAY | Freq: Every day | RESPIRATORY_TRACT | 5 refills | Status: DC

## 2020-09-27 NOTE — Telephone Encounter (Signed)
Pt asks for a callback from a nurse.

## 2020-09-27 NOTE — Assessment & Plan Note (Addendum)
No follow up for over 1 year. Complaining of dry hacking cough and shortness of breath. Stopped Singulair and stopped using Singulair daily. No pulmonology follow up for over 2 years. Patient was not sure what Fasenra injection was used for.  Today's spirometry was normal.   Continue Fasenra injections every 8 weeks - scheduled for tomorrow as patient received a new injection for osteoporosis yesterday. . Daily controller medication(s): RESTART Dulera 144mcg 1 puff daily with spacer and rinse mouth after each use.  o Let us know if it's too expensive.  . During upper respiratory infections/asthma flares: start Dulera 167mcg 2 puffs twice a day with spacer and rinse mouth afterwards for 1-2 weeks until your breathing symptoms return to baseline.  . May use albuterol rescue inhaler 2 puffs every 4 to 6 hours as needed for shortness of breath, chest tightness, coughing, and wheezing. May use albuterol rescue inhaler 2 puffs 5 to 15 minutes prior to strenuous physical activities. Monitor frequency of use.  . Repeat spirometry at next visit. . Stressed importance of keeping follow up visits.

## 2020-09-27 NOTE — Assessment & Plan Note (Addendum)
Past history - 2018 skin testing negative to environmental allergies. Interim history - nasal congestion present, has issues with dry mouth.  Start Xhance 1 spray per nostril twice a day. Sample given. Demonstrated proper use.   Let us know if this is not covered.  This replaces Flonase.   No antihistamines given dry mouth/dry eye issues.

## 2020-09-27 NOTE — Assessment & Plan Note (Signed)
Stopped famotidine and not sure when. Only takes tums prn.  Continue dietary and lifestyle modifications.

## 2020-09-27 NOTE — Telephone Encounter (Signed)
Spoke to pt. To let her know that the dulera comes in 120 doses so it should last her 3 months so even though she pays 100.00 for her inhaler it comes out to be a little over 35.00 dollars a month for 3 months. The pt.'s lumigan for her eyes are 0.1%.

## 2020-09-27 NOTE — Patient Instructions (Addendum)
Asthma:  Continue Fasenra injections every 8 weeks.  . Daily controller medication(s): RESTART Dulera 164mcg 1 puff daily with spacer and rinse mouth after each use.  o Let us know if it's too expensive.   . During upper respiratory infections/asthma flares: start Dulera 161mcg 2 puffs twice a day with spacer and rinse mouth afterwards for 1-2 weeks until your breathing symptoms return to baseline.  . May use albuterol rescue inhaler 2 puffs every 4 to 6 hours as needed for shortness of breath, chest tightness, coughing, and wheezing. May use albuterol rescue inhaler 2 puffs 5 to 15 minutes prior to strenuous physical activities. Monitor frequency of use.  . Asthma control goals:  o Full participation in all desired activities (may need albuterol before activity) o Albuterol use two times or less a week on average (not counting use with activity) o Cough interfering with sleep two times or less a month o Oral steroids no more than once a year o No hospitalizations  Sinuses:   Start Xhance 1 spray per nostril twice a day. Sample given. Demonstrated proper use.   Let us know if this is not covered.  This replaces Flonase.   Heartburn   Continue dietary and lifestyle modifications.  Follow up in 2 months or sooner if needed.   Gastroesophageal Reflux Disease, Adult  Gastroesophageal reflux (GER) happens when acid from the stomach flows up into the tube that connects the mouth and the stomach (esophagus). Normally, food travels down the esophagus and stays in the stomach to be digested. With GER, food and stomach acid sometimes move back up into the esophagus. You may have a disease called gastroesophageal reflux disease (GERD) if the reflux:  Happens often.  Causes frequent or very bad symptoms.  Causes problems such as damage to the esophagus. When this happens, the esophagus becomes sore and swollen. Over time, GERD can make small holes (ulcers) in the lining of the  esophagus. What are the causes? This condition is caused by a problem with the muscle between the esophagus and the stomach. When this muscle is weak or not normal, it does not close properly to keep food and acid from coming back up from the stomach. The muscle can be weak because of:  Tobacco use.  Pregnancy.  Having a certain type of hernia (hiatal hernia).  Alcohol use.  Certain foods and drinks, such as coffee, chocolate, onions, and peppermint. What increases the risk?  Being overweight.  Having a disease that affects your connective tissue.  Taking NSAIDs, such a ibuprofen. What are the signs or symptoms?  Heartburn.  Difficult or painful swallowing.  The feeling of having a lump in the throat.  A bitter taste in the mouth.  Bad breath.  Having a lot of saliva.  Having an upset or bloated stomach.  Burping.  Chest pain. Different conditions can cause chest pain. Make sure you see your doctor if you have chest pain.  Shortness of breath or wheezing.  A long-term cough or a cough at night.  Wearing away of the surface of teeth (tooth enamel).  Weight loss. How is this treated?  Making changes to your diet.  Taking medicine.  Having surgery. Treatment will depend on how bad your symptoms are. Follow these instructions at home: Eating and drinking  Follow a diet as told by your doctor. You may need to avoid foods and drinks such as: ? Coffee and tea, with or without caffeine. ? Drinks that contain alcohol. ? Energy  drinks and sports drinks. ? Bubbly (carbonated) drinks or sodas. ? Chocolate and cocoa. ? Peppermint and mint flavorings. ? Garlic and onions. ? Horseradish. ? Spicy and acidic foods. These include peppers, chili powder, curry powder, vinegar, hot sauces, and BBQ sauce. ? Citrus fruit juices and citrus fruits, such as oranges, lemons, and limes. ? Tomato-based foods. These include red sauce, chili, salsa, and pizza with red  sauce. ? Fried and fatty foods. These include donuts, french fries, potato chips, and high-fat dressings. ? High-fat meats. These include hot dogs, rib eye steak, sausage, ham, and bacon. ? High-fat dairy items, such as whole milk, butter, and cream cheese.  Eat small meals often. Avoid eating large meals.  Avoid drinking large amounts of liquid with your meals.  Avoid eating meals during the 2-3 hours before bedtime.  Avoid lying down right after you eat.  Do not exercise right after you eat.   Lifestyle  Do not smoke or use any products that contain nicotine or tobacco. If you need help quitting, ask your doctor.  Try to lower your stress. If you need help doing this, ask your doctor.  If you are overweight, lose an amount of weight that is healthy for you. Ask your doctor about a safe weight loss goal.   General instructions  Pay attention to any changes in your symptoms.  Take over-the-counter and prescription medicines only as told by your doctor.  Do not take aspirin, ibuprofen, or other NSAIDs unless your doctor says it is okay.  Wear loose clothes. Do not wear anything tight around your waist.  Raise (elevate) the head of your bed about 6 inches (15 cm). You may need to use a wedge to do this.  Avoid bending over if this makes your symptoms worse.  Keep all follow-up visits. Contact a doctor if:  You have new symptoms.  You lose weight and you do not know why.  You have trouble swallowing or it hurts to swallow.  You have wheezing or a cough that keeps happening.  You have a hoarse voice.  Your symptoms do not get better with treatment. Get help right away if:  You have sudden pain in your arms, neck, jaw, teeth, or back.  You suddenly feel sweaty, dizzy, or light-headed.  You have chest pain or shortness of breath.  You vomit and the vomit is green, yellow, or black, or it looks like blood or coffee grounds.  You faint.  Your poop (stool) is red,  bloody, or black.  You cannot swallow, drink, or eat. These symptoms may represent a serious problem that is an emergency. Do not wait to see if the symptoms will go away. Get medical help right away. Call your local emergency services (911 in the [place] Do not drive yourself to the hospital. Summary  If a person has gastroesophageal reflux disease (GERD), food and stomach acid move back up into the esophagus and cause symptoms or problems such as damage to the esophagus.  Treatment will depend on how bad your symptoms are.  Follow a diet as told by your doctor.  Take all medicines only as told by your doctor. This information is not intended to replace advice given to you by your health care provider. Make sure you discuss any questions you have with your health care provider. Document Revised: 01/17/2020 Document Reviewed: 01/17/2020 Elsevier Patient Education  [place].

## 2020-09-28 ENCOUNTER — Ambulatory Visit (INDEPENDENT_AMBULATORY_CARE_PROVIDER_SITE_OTHER): Payer: Medicare Other

## 2020-09-28 ENCOUNTER — Ambulatory Visit: Payer: Self-pay

## 2020-09-28 DIAGNOSIS — J4550 Severe persistent asthma, uncomplicated: Secondary | ICD-10-CM | POA: Diagnosis not present

## 2020-09-29 NOTE — Telephone Encounter (Signed)
Pt. Calling in needing to go over again on how to use the inhaler with spacer. After going over it again with pt. She felt better after going over how to use the spacer with her again and she wrote down step by step and went over nasal wash with her as well.

## 2020-10-05 NOTE — Telephone Encounter (Signed)
Pt. Letting me know she hasn't received her free xhance. I let pt. Know today after talking to the xhance rep that medicare doesn't receive free xhance. I told pt. That I will put another xhance up at the front desk for pt. to pick up first of next week and I will work on getting a PA done for xhance.

## 2020-10-09 ENCOUNTER — Other Ambulatory Visit: Payer: Self-pay

## 2020-10-09 MED ORDER — XHANCE 93 MCG/ACT NA EXHU: INHALANT_SUSPENSION | NASAL | 5 refills | Status: DC

## 2020-10-09 NOTE — Telephone Encounter (Signed)
Unable to leave message that her Truett Perna has been approved and sent into [employer].

## 2020-10-09 NOTE — Telephone Encounter (Signed)
PA sent in for xhance through CoverMyMed waiting on response.

## 2020-10-10 NOTE — Telephone Encounter (Signed)
When I spoke to the pt. Yesterday to let her know that I was sending in a PA for her xhance she was letting me know that she was having trouble getting the xhance up her left nostril pt. Does have history of nasal polyps and 2 sinus surgeries with polyp removal. Please advise.

## 2020-10-10 NOTE — Telephone Encounter (Signed)
Make sure she watched the video on how to do it. If she still has issues have her come in and demonstrate for [place].  Thank you.

## 2020-10-16 MED ORDER — XHANCE 93 MCG/ACT NA EXHU: INHALANT_SUSPENSION | NASAL | 5 refills | Status: DC

## 2020-10-16 NOTE — Telephone Encounter (Signed)
Pt. Calling stating she has seen some  Blood maybe just a peasize amount on her kleenex after she has blown her nose right after she had used the xhance  I told her not to blow her nose right after using the xhance which she has been doing. She says the xhance is helping a lot she can breathe a lot better and she says it is doing better getting into the left nostril. Do you want her to still continue using of the xhance, if so I'll have to resend through [facility] since her regular pharmacy can't get it. Pt. Aware.

## 2020-10-16 NOTE — Telephone Encounter (Signed)
Continue Xhance 1 spray per nostril ONCE a day instead of twice a day.  If bleeding getting worse - try to take a break for 1 week and then restart.  Please send in Rx.  Thank you.

## 2020-10-16 NOTE — Addendum Note (Signed)
Addended by: Gerre Pebbles A on: 10/16/2020 02:25 PM   Modules accepted: Orders

## 2020-10-16 NOTE — Telephone Encounter (Signed)
Pt.'s xhance has been approved. I sent the prescription in the local pharmacy [facility]. Morey Hummingbird spoke to [place] [facility] since her pharmacy can't get the xhance we'll send the xhance through the [facility]. Will let pt. Know.

## 2020-10-16 NOTE — Telephone Encounter (Signed)
Left pt. A message to use the xhance  just 1 spray once a day. If your nose bleeding is getting worse -try to take a break for 1 week and then restart. Sent in [facility] to [facility] PA

## 2020-11-22 ENCOUNTER — Ambulatory Visit: Payer: Self-pay

## 2020-11-23 ENCOUNTER — Ambulatory Visit: Payer: Self-pay

## 2020-11-24 ENCOUNTER — Encounter: Payer: Self-pay | Admitting: Allergy

## 2020-11-24 ENCOUNTER — Other Ambulatory Visit: Payer: Self-pay

## 2020-11-24 ENCOUNTER — Ambulatory Visit (INDEPENDENT_AMBULATORY_CARE_PROVIDER_SITE_OTHER): Payer: Medicare Other

## 2020-11-24 ENCOUNTER — Ambulatory Visit: Payer: Medicare Other | Admitting: Allergy

## 2020-11-24 VITALS — BP 112/70 | HR 64 | Temp 98.2°F | Resp 16

## 2020-11-24 DIAGNOSIS — J4550 Severe persistent asthma, uncomplicated: Secondary | ICD-10-CM

## 2020-11-24 DIAGNOSIS — J310 Chronic rhinitis: Secondary | ICD-10-CM

## 2020-11-24 DIAGNOSIS — R12 Heartburn: Secondary | ICD-10-CM

## 2020-11-24 DIAGNOSIS — J4540 Moderate persistent asthma, uncomplicated: Secondary | ICD-10-CM

## 2020-11-24 NOTE — Assessment & Plan Note (Signed)
Only takes tums prn - also for osteoporosis.  Continue dietary and lifestyle modifications.

## 2020-11-24 NOTE — Progress Notes (Signed)
Follow Up Note  RE: Cassandra Sims MRN: [id number] DOB: [birth year] Date of Office Visit: 11/24/2020  Referring provider: Patrecia Pour, Christean Grief, MD Primary care provider: Patrecia Pour, Christean Grief, MD  Chief Complaint: Asthma  History of Present Illness: I had the pleasure of seeing Cassandra Sims for a follow up visit at the [facility] on 11/24/2020. She is a 87 y.o. female, who is being followed for asthma on Fasenra, chronic rhinitis and GERD. Her previous allergy office visit was on 09/27/2020 with Dr. Maudie Mercury. Today is a regular follow up visit.  Asthma ACT score 21.  Currently only using Dulera 119mcg 1 puff 1-2 times per week with good benefit. No albuterol use since the last visit.  Receiving Fasenra injections with no issues.  Denies any ER/urgent care visits or prednisone use since the last visit.  Chronic rhinitis Currently using Xhance 1 spray per nostril QAM which has been helping - decreased dosing due to epistaxis. Using nettipot 1-2 times per week.   Patient saw her eye doctor this Monday and was told that her glaucoma is worse and to stay away from steroids.   GERD (gastroesophageal reflux disease) Reflux stable and takes tums prn for the osteoporosis.   Assessment and Plan: [name] is a 87 y.o. female with: Moderate persistent asthma Doing much better but only using Dulera 1-2 times per week as a rescue with good benefit. No flares since the last visit. Patient has worsening glaucoma.   Today's spirometry was normal.   ACT score 21.  Continue Fasenra injections every 8 weeks - given today.   Daily controller medication(s): Take Dulera 18mcg 1 puff once a day on Monday/Wednesday/Friday with spacer and rinse mouth afterwards - to prevent asthma flares.  . During upper respiratory infections/asthma flares: start Dulera 186mcg 2 puffs twice a day with spacer and rinse mouth afterwards for 1-2 weeks until your breathing symptoms return to baseline.  . May  use albuterol rescue inhaler 2 puffs every 4 to 6 hours as needed for shortness of breath, chest tightness, coughing, and wheezing. May use albuterol rescue inhaler 2 puffs 5 to 15 minutes prior to strenuous physical activities. Monitor frequency of use.  . Get spirometry at next visit.  Chronic rhinitis Past history - 2018 skin testing negative to environmental allergies. Interim history - nasal congestion improved with Xhance 1 spray daily (decreased dose due to epistaxis). Apparently glaucoma is worsening.   STOP Xhance due glaucoma.  Use nasal saline lavage (i.e., NeilMed) 1-2 times per day instead.   No antihistamines given dry mouth/dry eye issues.   Heartburn Only takes tums prn - also for osteoporosis.  Continue dietary and lifestyle modifications.  Return in about 4 months (around 03/27/2021).  No orders of the defined types were placed in this encounter.  Lab Orders  No laboratory test(s) ordered today    Diagnostics: Spirometry:  Tracings reviewed. Her effort: Good reproducible efforts. FVC: 1.78L FEV1: 1.37L, 92% predicted FEV1/FVC ratio: 77% Interpretation: Spirometry consistent with normal pattern.  Please see scanned spirometry results for details.  Medication List:  Current Outpatient Medications  Medication Sig Dispense Refill  . acetaminophen (TYLENOL) 500 MG tablet Take 1,000 mg by mouth every 8 (eight) hours as needed for mild pain.    Marland Kitchen albuterol (VENTOLIN HFA) 108 (90 Base) MCG/ACT inhaler Inhale 2 puffs into the lungs every 4 (four) hours as needed for wheezing. Reported on 10/10/2015 18 g 2  . amLODipine (NORVASC) 2.5 MG tablet Take  by mouth.    Marland Kitchen aspirin 81 MG EC tablet Take by mouth.    . Azelastine HCl 0.15 % SOLN Place 1 spray into both nostrils 2 (two) times daily. 90 mL 1  . Benralizumab 30 MG/ML SOSY Inject 30 mg into the skin every 8 (eight) weeks.    . bimatoprost (LUMIGAN) 0.03 % ophthalmic solution 1 drop at bedtime.    . Calcium Carbonate  Antacid (TUMS PO) Take by mouth. Take 2 tums twice daily.    . carboxymethylcellulose (REFRESH PLUS) 0.5 % SOLN Place 1 drop into both eyes 5 (five) times daily as needed (for dry eyes).    . Cholecalciferol 75 MCG (3000 UT) TABS Take 1 tablet by mouth daily.    Marland Kitchen denosumab (PROLIA) 60 MG/ML SOSY injection Inject into the skin.    Marland Kitchen dorzolamide (TRUSOPT) 2 % ophthalmic solution Place 1 drop into both eyes 2 times daily.    Marland Kitchen escitalopram (LEXAPRO) 5 MG tablet Take 2.5 mg by mouth daily.    . iron polysaccharides (NIFEREX) 150 MG capsule Take by mouth daily.    . Lactobacillus (ACIDOPHILUS PO) Take 1 capsule by mouth 3 (three) times a week.     . LUTEIN PO Take 20 mg by mouth daily.    . mometasone-formoterol (DULERA) 100-5 MCG/ACT AERO Inhale 1 puff into the lungs daily. with spacer and rinse mouth afterwards. 1 each 5  . Multiple Vitamin (MULTIVITAMIN WITH MINERALS) TABS tablet Take 1 tablet by mouth daily. Nature Made Multi Vitamin for Her 50+ W/No Iron    . mupirocin ointment (BACTROBAN) 2 % Place 1 application into the nose 3 (three) times daily. 22 g 0  . olmesartan (BENICAR) 40 MG tablet Take 1 tablet by mouth daily.    . psyllium (METAMUCIL) 58.6 % powder Take 1 packet by mouth daily. 14.3 g (1 heaping tablespoon) every morning.    . triamcinolone (KENALOG) 0.025 % ointment Apply 1 application topically 2 (two) times daily.    . Turmeric 450 MG CAPS Take 450 mg by mouth daily with lunch.     . labetalol (NORMODYNE) 100 MG tablet Take 1 tablet by mouth 2 (two) times daily. (Patient not taking: Reported on 11/24/2020)    . telmisartan (MICARDIS) 80 MG tablet Take by mouth.     Current Facility-Administered Medications  Medication Dose Route Frequency Provider Last Rate Last Admin  . Benralizumab SOSY 30 mg  30 mg Subcutaneous Q8 Weeks Jiles Prows, MD   30 mg at 11/24/20 1009   Allergies: Allergies  Allergen Reactions  . Penicillins Shortness Of Breath and Swelling    Has patient had  a PCN reaction causing immediate rash, facial/tongue/throat swelling, SOB or lightheadedness with hypotension: Yes Has patient had a PCN reaction causing severe rash involving mucus membranes or skin necrosis: No Has patient had a PCN reaction that required hospitalization: No Has patient had a PCN reaction occurring within the last 10 years: No If all of the above answers are "NO", then may proceed with Cephalosporin use.   . Zantac [Ranitidine] Itching  . Apraclonidine Other (See Comments)    Very light sensitive, swollen eye  . Brinzolamide Other (See Comments)  . Doxycycline Other (See Comments)    Pt states she tolerates OKAY  . Lactose Other (See Comments)  . Acetaminophen   . Adhesive [Tape] Itching    BAND-AID TAPE-skin redness/itching  . Clindamycin/Lincomycin Other (See Comments)    Severe joint pain.  . Codeine Nausea And  Vomiting  . Darvocet [Propoxyphene N-Acetaminophen] Other (See Comments)    HTN  . Dilaudid [Hydromorphone Hcl] Nausea And Vomiting    DROP BLOOD PRESSURE HYPOTENSIVE  . Flagyl [Metronidazole] Other (See Comments)    Joint pains  . Lactose Intolerance (Gi)   . Levofloxacin     Joint pain  . Macrodantin [Nitrofurantoin Macrocrystal]   . Morphine And Related     hallucinate  . Other   . Pedi-Pre Tape Spray [Wound Dressing Adhesive]   . Premarin [Conjugated Estrogens] Itching  . Tramadol Hcl Other (See Comments)    Causes insomnia  . Vancomycin Hives and Itching  . Zantac [Ranitidine Hcl]     Itching   . Zofran [Ondansetron Hcl]     HTN  . Clarithromycin     Other reaction(s): Abdominal Pain Anxious/nervous feeling  . Denosumab Rash  . Nitrofuran Derivatives Rash  . Sulfa Antibiotics Rash   I reviewed her past medical history, social history, family history, and environmental history and no significant changes have been reported from her previous visit.  Review of Systems  Constitutional: Negative for appetite change, fever and unexpected  weight change.  HENT: Negative for congestion and rhinorrhea.   Eyes: Negative for itching.  Respiratory: Negative for cough, chest tightness, shortness of breath and wheezing.   Gastrointestinal: Negative for abdominal pain.  Skin: Negative for rash.  Allergic/Immunologic: Negative for environmental allergies.  Neurological: Negative for headaches.   Objective: BP 112/70   Pulse 64   Temp 98.2 F (36.8 C) (Temporal)   Resp 16   SpO2 95%  There is no height or weight on file to calculate BMI. Physical Exam Vitals and nursing note reviewed.  Constitutional:      Appearance: Normal appearance. She is well-developed.  HENT:     Head: Normocephalic and atraumatic.     Right Ear: Tympanic membrane and external ear normal.     Left Ear: Tympanic membrane and external ear normal.     Nose: Nose normal.     Mouth/Throat:     Mouth: Mucous membranes are moist.     Pharynx: Oropharynx is clear.  Eyes:     Conjunctiva/sclera: Conjunctivae normal.  Cardiovascular:     Rate and Rhythm: Normal rate and regular rhythm.     Heart sounds: Normal heart sounds. No murmur heard.   Pulmonary:     Effort: Pulmonary effort is normal.     Breath sounds: Normal breath sounds. No wheezing, rhonchi or rales.  Musculoskeletal:     Cervical back: Neck supple.  Skin:    General: Skin is warm.     Findings: No rash.  Neurological:     General: No focal deficit present.     Mental Status: She is alert.    Previous notes and tests were reviewed. The plan was reviewed with the patient/family, and all questions/concerned were addressed.  It was my pleasure to see Debhora today and participate in her care. Please feel free to contact me with any questions or concerns.  Sincerely,  Rexene Alberts, DO Allergy & Immunology  [facility] office: [place] office: [phone]

## 2020-11-24 NOTE — Assessment & Plan Note (Addendum)
Doing much better but only using Dulera 1-2 times per week as a rescue with good benefit. No flares since the last visit. Patient has worsening glaucoma.   Today's spirometry was normal.   ACT score 21.  Continue Fasenra injections every 8 weeks - given today.   Daily controller medication(s): Take Dulera 135mcg 1 puff once a day on Monday/Wednesday/Friday with spacer and rinse mouth afterwards - to prevent asthma flares.  . During upper respiratory infections/asthma flares: start Dulera 183mcg 2 puffs twice a day with spacer and rinse mouth afterwards for 1-2 weeks until your breathing symptoms return to baseline.  . May use albuterol rescue inhaler 2 puffs every 4 to 6 hours as needed for shortness of breath, chest tightness, coughing, and wheezing. May use albuterol rescue inhaler 2 puffs 5 to 15 minutes prior to strenuous physical activities. Monitor frequency of use.  . Get spirometry at next visit.

## 2020-11-24 NOTE — Patient Instructions (Addendum)
Asthma:  Continue Fasenra injections every 8 weeks.   Daily controller medication(s): Take Dulera 122mcg 1 puff once a day on Monday/Wednesday Friday with spacer and rinse mouth afterwards. . During upper respiratory infections/asthma flares: start Dulera 143mcg 2 puffs twice a day with spacer and rinse mouth afterwards for 1-2 weeks until your breathing symptoms return to baseline.  . May use albuterol rescue inhaler 2 puffs every 4 to 6 hours as needed for shortness of breath, chest tightness, coughing, and wheezing. May use albuterol rescue inhaler 2 puffs 5 to 15 minutes prior to strenuous physical activities. Monitor frequency of use.  . Asthma control goals:  o Full participation in all desired activities (may need albuterol before activity) o Albuterol use two times or less a week on average (not counting use with activity) o Cough interfering with sleep two times or less a month o Oral steroids no more than once a year o No hospitalizations  Sinuses:   STOP Xhance due your glaucoma.  Use nasal saline lavage (i.e., NeilMed) 1-2 times per day instead.   Heartburn   Continue dietary and lifestyle modifications.  Follow up in 4 months or sooner if needed.  Buffered Isotonic Saline Irrigations:  Goal: . When you irrigate with the isotonic saline (salt water) it washes mucous and other debris from your nose that could be contributing to your nasal symptoms.   Recipe: Marland Kitchen Obtain 1 quart jar that is clean . Fill with clean (bottled, boiled or distilled) water . Add 1-2 heaping teaspoons of salt without iodine o If the solution with 2 teaspoons of salt is too strong, adjust the amount down until better tolerated . Add 1 teaspoon of [employer] baking soda (pure bicarbonate) . Mix ingredients together and store at room temperature and discard after 1 week * Alternatively you can buy pre made salt packets for the NeilMed bottle or there          are other over the counter brands  available  Instructions: . Warm  cup of the solution in the microwave if desired but be careful not to overheat as this will burn the inside of your nose . Stand over a sink (or do it while you shower) and squirt the solution into one side of your nose aiming towards the back of your head o Sometimes saying "coca cola" while irrigating can be helpful to prevent fluid from going down your throat  . The solution will travel to the back of your nose and then come out the other side . Perform this again on the other side . Try to do this twice a day . If you are using a nasal spray in addition to the irrigation, irrigate first and then use the topical nasal spray otherwise you will wash the nasal spray out of your nose

## 2020-11-24 NOTE — Assessment & Plan Note (Signed)
Past history - 2018 skin testing negative to environmental allergies. Interim history - nasal congestion improved with Xhance 1 spray daily (decreased dose due to epistaxis). Apparently glaucoma is worsening.   STOP Xhance due glaucoma.  Use nasal saline lavage (i.e., NeilMed) 1-2 times per day instead.   No antihistamines given dry mouth/dry eye issues.

## 2020-12-07 ENCOUNTER — Telehealth: Payer: Self-pay | Admitting: Allergy

## 2020-12-07 NOTE — Telephone Encounter (Signed)
Patient called and said that she was giving Dulera  To try andshe  [facility] one and it will be $100.00. and she wants to know if there is any coupon or any thing to lower the price. [facility] [address] [phone]

## 2020-12-08 NOTE — Telephone Encounter (Signed)
Went online to [email or web address]  They have a savings card but the office was unable to get the savings card due to not being the patient.  Called the patient and informed her of the website and the savings card.  Patient stated she could get her daughter to go online and get the savings card.  Patient was instructed to call back if she needed anymore assistance.

## 2020-12-19 ENCOUNTER — Telehealth: Payer: Self-pay

## 2020-12-19 MED ORDER — AZELASTINE HCL 0.1 % NA SOLN: 1.0000 | Freq: Two times a day (BID) | NASAL | 3 refills | Status: DC | PRN

## 2020-12-19 NOTE — Telephone Encounter (Signed)
I can't prescribe a steroid nasal spray but she can try an antihistamine nasal spray.  Use azelastine nasal spray 1-2 sprays per nostril twice a day as needed.   Start with 1 spray per nostril once a day and see if it helps.  If no improvement, recommend ENT evaluation next.

## 2020-12-19 NOTE — Telephone Encounter (Signed)
Patient called stating that Dr. Maudie Mercury informed her to stop Xhance due glaucoma and it causing her pressure to go up. Patient is wondering what else she can take because she is having nasal congestion and just using her neti pot is not  Helping. Please advise.

## 2020-12-19 NOTE — Telephone Encounter (Signed)
Spoke with patient, informed her of Dr. Julianne Rice recommendation. Patient verbalized understanding and appreciates what we are doing to help her.

## 2020-12-29 NOTE — Telephone Encounter (Signed)
Patient call to inform me that the nasal spray is not helping. We went over technique and she is using it correctly. I informed her that per Dr. Maudie Mercury if no improvement she would need an ENT evaluation. Patient verbalized understanding and will reach out to her ENT doctor.

## 2021-01-15 ENCOUNTER — Telehealth: Payer: Self-pay

## 2021-01-15 NOTE — Telephone Encounter (Signed)
I don't know what kind of rash she has without seeing it. Will wait what dermatology has to say first.

## 2021-01-15 NOTE — Telephone Encounter (Signed)
Patient called and states that she has broken out in some type of rash and has made an appointment with her dermatologist. Patient is concerned that the rash will interfere with her Fasenra injections and has cancelled her appointment on July 5 until she gets the okay from her dermatologist to resume her Fasenra injections. Patient states that she will call back when she gets the ok from her dermatologist to resume injections. Please advise if Cassandra Sims will cause interfere with her current rash.

## 2021-01-15 NOTE — Telephone Encounter (Signed)
Patient called because she has a rash on her arm and is due for a fasenra shot on 01/23/21.

## 2021-01-16 ENCOUNTER — Telehealth: Payer: Self-pay | Admitting: Allergy

## 2021-01-16 NOTE — Telephone Encounter (Signed)
Patient wanted to inform us that she is using hydrocortisone cream to help with the itching.

## 2021-01-16 NOTE — Telephone Encounter (Signed)
Spoke with patient, she called regarding her rash. As in previous message Dr. Maudie Mercury stated for patient to see the dermatologist and see what they say. Patient verbalized understanding and will call the office next week with an update.

## 2021-01-16 NOTE — Telephone Encounter (Signed)
Patient called and would like for you to call here. [phone].

## 2021-01-19 ENCOUNTER — Ambulatory Visit: Payer: Self-pay

## 2021-01-23 ENCOUNTER — Ambulatory Visit: Payer: Self-pay

## 2021-01-24 ENCOUNTER — Ambulatory Visit: Payer: Self-pay

## 2021-01-24 NOTE — Telephone Encounter (Signed)
Please call patient.  She can resume fasenra injections - please check when is the last time she got it and schedule accordingly. She is supposed to be every 8 weeks. The Fasenra injections are for her asthma and not for itching though.  There is a different type of asthma biologics that may help better for the itching - schedule for follow up to discuss further if interested.   I was able to see the dermatology notes through our computer system.

## 2021-01-24 NOTE — Telephone Encounter (Signed)
Attempted to call the patient and the phone rang continuously. Will check to see if she has medication in [place] and will call her back to inform her of Dr. Julianne Rice recommendations and to get her scheduled to come in and receive her Berna Bue.

## 2021-01-24 NOTE — Telephone Encounter (Signed)
Patient called and wanted to see if her dermatologist sent over papers about her rash and itching. Patient wants to know when she can get her Berna Bue injections as it was held due to her itching. Please advise on when or if she can receive her Fasenra injection in the near future. Thank you

## 2021-01-31 ENCOUNTER — Encounter: Payer: Self-pay | Admitting: Allergy

## 2021-01-31 ENCOUNTER — Telehealth: Payer: Self-pay | Admitting: Allergy

## 2021-01-31 ENCOUNTER — Ambulatory Visit (INDEPENDENT_AMBULATORY_CARE_PROVIDER_SITE_OTHER): Payer: Medicare Other

## 2021-01-31 ENCOUNTER — Other Ambulatory Visit: Payer: Self-pay

## 2021-01-31 ENCOUNTER — Ambulatory Visit: Payer: Medicare Other | Admitting: Allergy

## 2021-01-31 VITALS — BP 136/64 | HR 57 | Temp 98.6°F | Resp 20

## 2021-01-31 DIAGNOSIS — R12 Heartburn: Secondary | ICD-10-CM

## 2021-01-31 DIAGNOSIS — L282 Other prurigo: Secondary | ICD-10-CM | POA: Insufficient documentation

## 2021-01-31 DIAGNOSIS — J310 Chronic rhinitis: Secondary | ICD-10-CM | POA: Diagnosis not present

## 2021-01-31 DIAGNOSIS — J4540 Moderate persistent asthma, uncomplicated: Secondary | ICD-10-CM

## 2021-01-31 DIAGNOSIS — J4550 Severe persistent asthma, uncomplicated: Secondary | ICD-10-CM

## 2021-01-31 MED ORDER — TRIAMCINOLONE ACETONIDE 0.1 % EX OINT: 1.0000 "application " | TOPICAL_OINTMENT | Freq: Two times a day (BID) | CUTANEOUS | 2 refills | Status: DC

## 2021-01-31 NOTE — Assessment & Plan Note (Signed)
Only takes tums prn - also for osteoporosis.  Continue dietary and lifestyle modifications.

## 2021-01-31 NOTE — Assessment & Plan Note (Signed)
Only using Dulera prn.   Today's spirometry was normal.   ACT score 21.  Continue Fasenra injections every 8 weeks - given today.   If itching/rash worsens then will switch to a different biologics then but low suspicion of Berna Bue causing her current pruritic rash.   Daily controller medication(s): Take Dulera 174mcg 1 puff once a day on Monday/Wednesday/Friday with spacer and rinse mouth afterwards. . During upper respiratory infections/asthma flares: start Dulera 194mcg 2 puffs twice a day with spacer and rinse mouth afterwards for 1-2 weeks until your breathing symptoms return to baseline.  . May use albuterol rescue inhaler 2 puffs every 4 to 6 hours as needed for shortness of breath, chest tightness, coughing, and wheezing. May use albuterol rescue inhaler 2 puffs 5 to 15 minutes prior to strenuous physical activities. Monitor frequency of use.   Get spirometry at next visit.

## 2021-01-31 NOTE — Telephone Encounter (Signed)
I have sooner openings in [place] and [place] if she wants to travel. I don't have anything in [place] as I'm already double booked most days - we can let her know if there's cancellations.  Thank you.

## 2021-01-31 NOTE — Patient Instructions (Addendum)
Asthma: Continue Fasenra injections every 8 weeks - given today.  Daily controller medication(s): Take Dulera 179mcg 1 puff once a day on Monday/Wednesday/Friday with spacer and rinse mouth afterwards. During upper respiratory infections/asthma flares: start Dulera 184mcg 2 puffs twice a day with spacer and rinse mouth afterwards for 1-2 weeks until your breathing symptoms return to baseline.  May use albuterol rescue inhaler 2 puffs every 4 to 6 hours as needed for shortness of breath, chest tightness, coughing, and wheezing. May use albuterol rescue inhaler 2 puffs 5 to 15 minutes prior to strenuous physical activities. Monitor frequency of use.  Asthma control goals:  Full participation in all desired activities (may need albuterol before activity) Albuterol use two times or less a week on average (not counting use with activity) Cough interfering with sleep two times or less a month Oral steroids no more than once a year No hospitalizations  Rash:  Continue proper skin care. Bring bloodwork to your next visit. Moisturizer: Triamcinolone-Eucerin twice a day.  Sinuses:  Try sinus plumber nasal spray - 1 spray per nostril once a day. This is a non-steroidal nasal spray and you can buy it without a prescription. [email or web address] Use nasal saline lavage (i.e., NeilMed) 1-2 times per day. Stop Afrin.  Heartburn  Continue dietary and lifestyle modifications.  Follow up in 2 months or sooner if needed in our [place] office.   Skin care recommendations  Bath time: Always use lukewarm water. AVOID very hot or cold water. Keep bathing time to 5-10 minutes. Do NOT use bubble bath. Use a mild soap and use just enough to wash the dirty areas. Do NOT scrub skin vigorously.  After bathing, pat dry your skin with a towel. Do NOT rub or scrub the skin.  Moisturizers and prescriptions:  ALWAYS apply moisturizers immediately  after bathing (within 3 minutes). This helps to lock-in moisture. Use the moisturizer several times a day over the whole body. Good summer moisturizers include: Aveeno, CeraVe, Cetaphil. Good winter moisturizers include: Aquaphor, Vaseline, Cerave, Cetaphil, Eucerin, Vanicream. When using moisturizers along with medications, the moisturizer should be applied about one hour after applying the medication to prevent diluting effect of the medication or moisturize around where you applied the medications. When not using medications, the moisturizer can be continued twice daily as maintenance.  Laundry and clothing: Avoid laundry products with added color or perfumes. Use unscented hypo-allergenic laundry products such as Tide free, Cheer free & gentle, and All free and clear.  If the skin still seems dry or sensitive, you can try double-rinsing the clothes. Avoid tight or scratchy clothing such as wool. Do not use fabric softeners or dyer sheets.

## 2021-01-31 NOTE — Assessment & Plan Note (Signed)
Past history - 2018 skin testing negative to environmental allergies. Worsening glaucoma. Interim history - using Afrin but not helpful. Azelastine ineffective.  Try sinus plumber nasal spray - 1 spray per nostril once a day.  This is a non-steroidal nasal spray and you can buy it without a prescription.  Use nasal saline lavage (i.e., NeilMed) 1-2 times per day.  Stop Afrin.

## 2021-01-31 NOTE — Telephone Encounter (Signed)
Patient called requesting an appointment with you. She does not want to see a NP. She said that you told her you needed to see her soon for itching and would fit her in. The soonest thing you have is 8/3. I scheduled her for that day, but I thought I would ask you if you wanted to fit her in sooner.

## 2021-01-31 NOTE — Assessment & Plan Note (Addendum)
Patient has underlying itching but noticed rash on her arms 1 month ago. Saw dermatology and gave reassurance with topical steroid cream which helps. . Continue proper skin care. . Moisturizer: Triamcinolone-Eucerin twice a day. . If still persistent then may need some bloodwork - patient will bring to next OV the bloodwork that was drawn recently. . Discussed that most medications that help with itching are antihistamines which would worsen her dry eye symptoms - will hold off for now.

## 2021-01-31 NOTE — Progress Notes (Signed)
Follow Up Note  RE: Cassandra Sims MRN: [id number] DOB: 1933-06-01 Date of Office Visit: 01/31/2021  Referring provider: Patrecia Pour, Christean Grief, MD Primary care provider: Patrecia Pour, Christean Grief, MD  Chief Complaint: Asthma  History of Present Illness: I had the pleasure of seeing Cassandra Sims for a follow up visit at the [facility] [place] on 01/31/2021. She is a 87 y.o. female, who is being followed for asthma, non-allergic chronic rhinitis, heartburn. Her previous allergy office visit was on 11/24/2020 with Dr. Maudie Mercury. Today is a new complaint visit of itching .  Rash/itching: Rash started about 1 month ago. Mainly occurs on her arms. Describes them as itchy/red. Associated symptoms include: none. Suspected triggers are unknown. Denies any fevers, chills, changes in medications, foods, personal care products or recent infections. She has tried the following therapies: topical steroid cream with some benefit. Systemic steroids no. Currently on no daily meds.  Previous work up includes: saw dermatology in July 2022 and per note no concern about continuing [place].  Previous history of rash/hives: no but has been having issues with itching.  Currently using cerave, dove unscented, all/tide unscented products. Patient is up to date with the following cancer screening tests: not up to date with colonoscopy.  No recent tick bites. Patient does eat red meat.   Moderate persistent asthma Some coughing attacks 2-3 times per week. Usually uses Dulera as needed and stopped the Monday/Wednesday/Friday regimen once the rash started up.   Chronic rhinitis Having some sneezing, rhinorrhea, watery and dry eyes. Tried azelastine nasal spray with no benefit in the past. Using afrin now for nasal congestion.  01/23/2021 dermatology visit: "ASSESSMENT: 1. Pruritus triamcinolone (KENALOG) 0.1 % cream  2. Seborrheic keratoses, inflamed  3. Clavus PLAN:  The above diagnosis and treatment options  were reviewed with the patient. Educated the patient about the benign nature of some of the lesions noted on today's exam.  Possibly underlying eczema with patient's history of allergic rhinitis. Possible benefit from [place] (IL-5) vs Dupixent (IL-4 and IL -13) for itching.   Medications Prescribed Today:   Continue Fasenra injections as prescribed by Allergy.   Start triamcinolone 0.1% cream BID PRN   Encouraged use of sunscreen with an SPF of at least 26, and other sun-protective measures including hats and long-sleeved clothing.  Advised regular self-skin exams to look for any changing or new lesions that appear suspicious, and we have reviewed ABCDE criteria.   The patient was encouraged to call or send a message through [facility] for any questions or concerns.  Return if symptoms worsen or fail to improve. - but sooner as needed"  Assessment and Plan: Debraann is a 87 y.o. female with: Moderate persistent asthma Only using Dulera prn.  Today's spirometry was normal.  ACT score 21. Continue Fasenra injections every 8 weeks - given today.  If itching/rash worsens then will switch to a different biologics then but low suspicion of Berna Bue causing her current pruritic rash.  Daily controller medication(s): Take Dulera 140mcg 1 puff once a day on Monday/Wednesday/Friday with spacer and rinse mouth afterwards. During upper respiratory infections/asthma flares: start Dulera 173mcg 2 puffs twice a day with spacer and rinse mouth afterwards for 1-2 weeks until your breathing symptoms return to baseline.  May use albuterol rescue inhaler 2 puffs every 4 to 6 hours as needed for shortness of breath, chest tightness, coughing, and wheezing. May use albuterol rescue inhaler 2 puffs 5 to 15 minutes prior to strenuous physical activities.  Monitor frequency of use.  Get spirometry at next visit.  Pruritic rash Patient has underlying itching but noticed rash on her arms 1 month ago. Saw  dermatology and gave reassurance with topical steroid cream which helps. Continue proper skin care. Moisturizer: Triamcinolone-Eucerin twice a day. If still persistent then may need some bloodwork - patient will bring to next OV the bloodwork that was drawn recently. Discussed that most medications that help with itching are antihistamines which would worsen her dry eye symptoms - will hold off for now.  Nonallergic rhinitis Past history - 2018 skin testing negative to environmental allergies. Worsening glaucoma. Interim history - using Afrin but not helpful. Azelastine ineffective. Try sinus plumber nasal spray - 1 spray per nostril once a day. This is a non-steroidal nasal spray and you can buy it without a prescription. Use nasal saline lavage (i.e., NeilMed) 1-2 times per day. Stop Afrin.  Heartburn Only takes tums prn - also for osteoporosis. Continue dietary and lifestyle modifications.  Return in about 2 months (around 04/03/2021).  Meds ordered this encounter  Medications   DISCONTD: triamcinolone ointment (KENALOG) 0.1 %    Sig: Apply 1 application topically 2 (two) times daily. Moisturize twice a day with this.    Dispense:  453 g    Refill:  2    Mix 1:1 with Eucerin.   triamcinolone ointment (KENALOG) 0.1 %    Sig: Apply 1 application topically 2 (two) times daily. Moisturize twice a day with this.    Dispense:  453 g    Refill:  2    Mix 1:1 with Eucerin.    Lab Orders  No laboratory test(s) ordered today    Diagnostics: Spirometry:  Tracings reviewed. Her effort: Good reproducible efforts. FVC: 1.56L FEV1: 1.19L, 73% predicted FEV1/FVC ratio: 75% Interpretation: Spirometry consistent with normal pattern.  Please see scanned spirometry results for details.  Medication List:  Current Outpatient Medications  Medication Sig Dispense Refill   acetaminophen (TYLENOL) 500 MG tablet Take 1,000 mg by mouth every 8 (eight) hours as needed for mild pain.      albuterol (VENTOLIN HFA) 108 (90 Base) MCG/ACT inhaler Inhale 2 puffs into the lungs every 4 (four) hours as needed for wheezing. Reported on 10/10/2015 18 g 2   amLODipine (NORVASC) 2.5 MG tablet Take by mouth.     Benralizumab 30 MG/ML SOSY Inject 30 mg into the skin every 8 (eight) weeks.     bimatoprost (LUMIGAN) 0.03 % ophthalmic solution 1 drop at bedtime.     Calcium Carbonate Antacid (TUMS PO) Take by mouth. Take 2 tums twice daily.     carboxymethylcellulose (REFRESH PLUS) 0.5 % SOLN Place 1 drop into both eyes 5 (five) times daily as needed (for dry eyes).     Cholecalciferol 75 MCG (3000 UT) TABS Take 1 tablet by mouth daily.     denosumab (PROLIA) 60 MG/ML SOSY injection Inject into the skin.     dorzolamide (TRUSOPT) 2 % ophthalmic solution Place 1 drop into both eyes 2 times daily.     escitalopram (LEXAPRO) 5 MG tablet Take 2.5 mg by mouth daily.     labetalol (NORMODYNE) 100 MG tablet Take 1 tablet by mouth 2 (two) times daily.     Lactobacillus (ACIDOPHILUS PO) Take 1 capsule by mouth 3 (three) times a week.      LUMIGAN 0.01 % SOLN 1 drop at bedtime.     LUTEIN PO Take 20 mg by mouth daily.  mometasone-formoterol (DULERA) 100-5 MCG/ACT AERO Inhale 1 puff into the lungs daily. with spacer and rinse mouth afterwards. 1 each 5   Multiple Vitamin (MULTIVITAMIN WITH MINERALS) TABS tablet Take 1 tablet by mouth daily. Nature Made Multi Vitamin for Her 50+ W/No Iron     olmesartan (BENICAR) 40 MG tablet Take 1 tablet by mouth daily.     psyllium (METAMUCIL) 58.6 % powder Take 1 packet by mouth daily. 14.3 g (1 heaping tablespoon) every morning.     telmisartan (MICARDIS) 80 MG tablet Take by mouth.     triamcinolone cream (KENALOG) 0.1 % Apply to affected skin of body twice daily as needed for itching. NEVER to face/neck/groin.     Turmeric 450 MG CAPS Take 450 mg by mouth daily with lunch.      triamcinolone ointment (KENALOG) 0.1 % Apply 1 application topically 2 (two) times  daily. Moisturize twice a day with this. 453 g 2   Current Facility-Administered Medications  Medication Dose Route Frequency Provider Last Rate Last Admin   Benralizumab SOSY 30 mg  30 mg Subcutaneous Q8 Weeks Kozlow, Donnamarie Poag, MD   30 mg at 01/31/21 1651   Allergies: Allergies  Allergen Reactions   Penicillins Shortness Of Breath and Swelling    Has patient had a PCN reaction causing immediate rash, facial/tongue/throat swelling, SOB or lightheadedness with hypotension: Yes Has patient had a PCN reaction causing severe rash involving mucus membranes or skin necrosis: No Has patient had a PCN reaction that required hospitalization: No Has patient had a PCN reaction occurring within the last 10 years: No If all of the above answers are "NO", then may proceed with Cephalosporin use.    Zantac [Ranitidine] Itching   Apraclonidine Other (See Comments)    Very light sensitive, swollen eye   Brinzolamide Other (See Comments)   Doxycycline Other (See Comments)    Pt states she tolerates OKAY   Lactose Other (See Comments)   Acetaminophen    Adhesive [Tape] Itching    BAND-AID TAPE-skin redness/itching   Clindamycin/Lincomycin Other (See Comments)    Severe joint pain.   Codeine Nausea And Vomiting   Darvocet [Propoxyphene N-Acetaminophen] Other (See Comments)    HTN   Dilaudid [Hydromorphone Hcl] Nausea And Vomiting    DROP BLOOD PRESSURE HYPOTENSIVE   Flagyl [Metronidazole] Other (See Comments)    Joint pains   Lactose Intolerance (Gi)    Levofloxacin     Joint pain   Macrodantin [Nitrofurantoin Macrocrystal]    Morphine And Related     hallucinate   Other    Pedi-Pre Tape Spray [Wound Dressing Adhesive]    Premarin [Conjugated Estrogens] Itching   Tramadol Hcl Other (See Comments)    Causes insomnia   Vancomycin Hives and Itching   Zantac [Ranitidine Hcl]     Itching    Zofran [Ondansetron Hcl]     HTN   Clarithromycin     Other reaction(s): Abdominal  Pain Anxious/nervous feeling   Denosumab Rash   Nitrofuran Derivatives Rash   Sulfa Antibiotics Rash   I reviewed her past medical history, social history, family history, and environmental history and no significant changes have been reported from her previous visit.  Review of Systems  Constitutional:  Negative for appetite change, fever and unexpected weight change.  HENT:  Negative for congestion and rhinorrhea.   Eyes:  Negative for itching.  Respiratory:  Negative for cough, chest tightness, shortness of breath and wheezing.   Gastrointestinal:  Negative for abdominal  pain.  Skin:  Positive for rash.  Allergic/Immunologic: Negative for environmental allergies.  Neurological:  Negative for headaches.   Objective: BP 136/64 (BP Location: Left Arm, Patient Position: Sitting, Cuff Size: Normal)   Pulse (!) 57   Temp 98.6 F (37 C) (Temporal)   Resp 20   SpO2 96%  There is no height or weight on file to calculate BMI. Physical Exam Vitals and nursing note reviewed.  Constitutional:      Appearance: Normal appearance. She is well-developed.  HENT:     Head: Normocephalic and atraumatic.     Right Ear: Tympanic membrane and external ear normal.     Left Ear: Tympanic membrane and external ear normal.     Nose: Nose normal.     Mouth/Throat:     Mouth: Mucous membranes are moist.     Pharynx: Oropharynx is clear.  Eyes:     Conjunctiva/sclera: Conjunctivae normal.  Cardiovascular:     Rate and Rhythm: Normal rate and regular rhythm.     Heart sounds: Normal heart sounds. No murmur heard. Pulmonary:     Effort: Pulmonary effort is normal.     Breath sounds: Normal breath sounds. No wheezing, rhonchi or rales.  Musculoskeletal:     Cervical back: Neck supple.  Skin:    General: Skin is warm.     Findings: Rash present.     Comments: Very frail skin with slight erythematous hue on upper extremities around the forearm area.  Neurological:     General: No focal deficit  present.     Mental Status: She is alert.   Previous notes and tests were reviewed. The plan was reviewed with the patient/family, and all questions/concerned were addressed.  It was my pleasure to see Railyn today and participate in her care. Please feel free to contact me with any questions or concerns.  Sincerely,  Rexene Alberts, DO Allergy & Immunology  [facility] office: [place] office: [phone]

## 2021-02-01 ENCOUNTER — Telehealth: Payer: Self-pay | Admitting: Allergy

## 2021-02-01 MED ORDER — ALBUTEROL SULFATE HFA 108 (90 BASE) MCG/ACT IN AERS: 2.0000 | INHALATION_SPRAY | RESPIRATORY_TRACT | 1 refills | Status: DC | PRN

## 2021-02-01 NOTE — Telephone Encounter (Signed)
Refill has been sent to the requested pharmacy and patient has been made aware.

## 2021-02-01 NOTE — Telephone Encounter (Signed)
Patient called and needs to have albuterol inhaler called into [facility] on [address] . [phone].

## 2021-02-02 ENCOUNTER — Ambulatory Visit: Payer: Self-pay

## 2021-02-05 ENCOUNTER — Telehealth: Payer: Self-pay | Admitting: Allergy

## 2021-02-05 NOTE — Telephone Encounter (Signed)
Please advise patient is confused about her nasal spray prescription.

## 2021-02-05 NOTE — Telephone Encounter (Signed)
Patient called and said [facility] called her about a prescription that Dr. Maudie Mercury sent to them. I told her Truett Perna comes from [place]. She said Dr. Maudie Mercury never prescribed that for her. She also said that she was supposed to order a nasal spray off of [place] and she doesn't know what that was.

## 2021-02-05 NOTE — Telephone Encounter (Signed)
Please call patient.  As per last OV: "Try sinus plumber nasal spray - 1 spray per nostril once a day. This is a non-steroidal nasal spray and you can buy it without a prescription. Use nasal saline lavage (i.e., NeilMed) 1-2 times per day. Stop Afrin."  I did not send in [place]. She should NOT be using any steroid nasal sprays due to her glaucoma - no Xhance.

## 2021-02-06 NOTE — Telephone Encounter (Signed)
Patient called back and stated [facility] called her about a script that was sent in, I reassured her that she doesn't need that [facility] script due to her glaucoma. She understood about the nasal sprays over the counter and to stop Afrin, She also has a NeilMed.

## 2021-02-06 NOTE — Telephone Encounter (Signed)
Left a message for pt to return our call to either me before the end of the end or tomorrow in the [place] office.

## 2021-02-21 ENCOUNTER — Ambulatory Visit: Payer: Medicare Other | Admitting: Allergy

## 2021-03-27 NOTE — Progress Notes (Signed)
Follow Up Note  RE: Cassandra Sims MRN: [id number] DOB: 1932-07-23 Date of Office Visit: 03/28/2021  Referring provider: Patrecia Pour, Christean Grief, MD Primary care provider: Patrecia Pour, Christean Grief, MD  Chief Complaint: Follow-up  History of Present Illness: I had the pleasure of seeing Cassandra Sims for a follow up visit at the [facility] [place] [facility] [place] on 03/28/2021. She is a 87 y.o. female, who is being followed for asthma on Fasenra, pruritic rash, nonallergic rhinitis. Her previous allergy office visit was on 01/31/2021 with Dr. Maudie Mercury. Today is a regular follow up visit.  Moderate persistent asthma Currently on Fasenra injections every 8 weeks with unknown benefit. Having some coughing lately about 2-3 times per week. Coughing fits usually lasts until she can drink some water. She has not tried albuterol for this.   Currently taking Dulera 122mg 1 puff once a day on Monday/Wednesday/Friday and doesn't think she needs it more often.    Pruritic rash Patient is still having itching on her extremities. Using triamcinolone daily with some benefit. Trying to moisturize daily.  June 2022 bloodwork - CMP, CBC diff, TSH  normal.   Nonallergic rhinitis Having some nasal congestion - tried sinus plumber with no benefit. Using saline rinses at times with some benefit. Never tried afrin.  Complaining of unsteady gait as well.   Assessment and Plan: AJacquelynis a 87y.o. female with: Moderate persistent asthma Some coughing 2-3 times per week. Has not used albuterol for this.  Today's spirometry was normal.  Continue Fasenra injections every 8 weeks - given today.  Use albuterol 2 puffs when you are having the coughing fits. Daily controller medication(s): Take Dulera 1062m 1 puff once a day on Monday/Wednesday/Friday with spacer and rinse mouth afterwards. During upper respiratory infections/asthma flares: start Dulera 10017m2 puffs twice a day with spacer and rinse mouth afterwards for  1-2 weeks until your breathing symptoms return to baseline.  May use albuterol rescue inhaler 2 puffs every 4 to 6 hours as needed for shortness of breath, chest tightness, coughing, and wheezing. May use albuterol rescue inhaler 2 puffs 5 to 15 minutes prior to strenuous physical activities. Monitor frequency of use.  Get spirometry at next visit.  Pruritic rash Past history - underlying itching but noticed rash on her arms 1 month ago. Saw dermatology and gave reassurance with topical steroid cream which helps. Interim history - topical triamcinolone helps but still itchy. June 2022 bloodwork - CMP, CBC diff, TSH  normal.  Continue proper skin care. Get bloodwork as below.  Moisturizer: Triamcinolone-Eucerin twice a day. Discussed that most medications that help with itching are antihistamines which would worsen her dry eye symptoms - will hold off for now.  Nonallergic rhinitis Past history - 2018 skin testing negative to environmental allergies. Worsening glaucoma. 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 Still having nasal congestion.  Use nasal saline lavage (i.e., NeilMed) 1-2 times per day. May use Afrin 1-2 times per week for severe nasal congestion.  No steroid nasal sprays due to glaucoma history.  Heartburn Continue dietary and lifestyle modifications. May take tums as before - also for osteoporosis.  Unsteady gait Follow up with PCP regarding the gait issues.  Return in about 2 months (around 05/28/2021).  No orders of the defined types were placed in this encounter.  Lab Orders         Alpha-Gal Panel         Tryptase  Sedimentation rate         C-reactive protein      Diagnostics: Spirometry:  Tracings reviewed. Her effort: Good reproducible efforts. FVC: 1.60L FEV1: 1.19L, 82% predicted FEV1/FVC ratio: 84% Interpretation: Spirometry consistent with normal pattern.  Please see scanned spirometry results for details.  Medication List:  Current  Outpatient Medications  Medication Sig Dispense Refill   acetaminophen (TYLENOL) 500 MG tablet Take 1,000 mg by mouth every 8 (eight) hours as needed for mild pain.     albuterol (VENTOLIN HFA) 108 (90 Base) MCG/ACT inhaler Inhale 2 puffs into the lungs every 4 (four) hours as needed for wheezing. Reported on 10/10/2015 18 g 1   amLODipine (NORVASC) 2.5 MG tablet Take by mouth.     Benralizumab 30 MG/ML SOSY Inject 30 mg into the skin every 8 (eight) weeks.     bimatoprost (LUMIGAN) 0.03 % ophthalmic solution 1 drop at bedtime.     Calcium Carbonate Antacid (TUMS PO) Take by mouth. Take 2 tums twice daily.     carboxymethylcellulose (REFRESH PLUS) 0.5 % SOLN Place 1 drop into both eyes 5 (five) times daily as needed (for dry eyes).     Cholecalciferol 75 MCG (3000 UT) TABS Take 1 tablet by mouth daily.     denosumab (PROLIA) 60 MG/ML SOSY injection Inject into the skin.     dorzolamide (TRUSOPT) 2 % ophthalmic solution Place 1 drop into both eyes 2 times daily.     escitalopram (LEXAPRO) 5 MG tablet Take 2.5 mg by mouth daily.     labetalol (NORMODYNE) 100 MG tablet Take 1 tablet by mouth 2 (two) times daily.     Lactobacillus (ACIDOPHILUS PO) Take 1 capsule by mouth 3 (three) times a week.      LUMIGAN 0.01 % SOLN 1 drop at bedtime.     LUTEIN PO Take 20 mg by mouth daily.     mometasone-formoterol (DULERA) 100-5 MCG/ACT AERO Inhale 1 puff into the lungs daily. with spacer and rinse mouth afterwards. 1 each 5   Multiple Vitamin (MULTIVITAMIN WITH MINERALS) TABS tablet Take 1 tablet by mouth daily. Nature Made Multi Vitamin for Her 50+ W/No Iron     olmesartan (BENICAR) 40 MG tablet Take 1 tablet by mouth daily.     psyllium (METAMUCIL) 58.6 % powder Take 1 packet by mouth daily. 14.3 g (1 heaping tablespoon) every morning.     telmisartan (MICARDIS) 80 MG tablet Take by mouth.     triamcinolone cream (KENALOG) 0.1 % Apply to affected skin of body twice daily as needed for itching. NEVER to  face/neck/groin.     triamcinolone ointment (KENALOG) 0.1 % Apply 1 application topically 2 (two) times daily. Moisturize twice a day with this. 453 g 2   Turmeric 450 MG CAPS Take 450 mg by mouth daily with lunch.      Current Facility-Administered Medications  Medication Dose Route Frequency Provider Last Rate Last Admin   Benralizumab SOSY 30 mg  30 mg Subcutaneous Q8 Weeks Jiles Prows, MD   30 mg at 03/28/21 [id number]   Allergies: Allergies  Allergen Reactions   Penicillins Shortness Of Breath and Swelling    Has patient had a PCN reaction causing immediate rash, facial/tongue/throat swelling, SOB or lightheadedness with hypotension: Yes Has patient had a PCN reaction causing severe rash involving mucus membranes or skin necrosis: No Has patient had a PCN reaction that required hospitalization: No Has patient had a PCN reaction occurring within the last 10  years: No If all of the above answers are "NO", then may proceed with Cephalosporin use.    Zantac [Ranitidine] Itching   Apraclonidine Other (See Comments)    Very light sensitive, swollen eye   Brinzolamide Other (See Comments)   Doxycycline Other (See Comments)    Pt states she tolerates OKAY   Lactose Other (See Comments)   Acetaminophen    Adhesive [Tape] Itching    BAND-AID TAPE-skin redness/itching   Clindamycin/Lincomycin Other (See Comments)    Severe joint pain.   Codeine Nausea And Vomiting   Darvocet [Propoxyphene N-Acetaminophen] Other (See Comments)    HTN   Dilaudid [Hydromorphone Hcl] Nausea And Vomiting    DROP BLOOD PRESSURE HYPOTENSIVE   Flagyl [Metronidazole] Other (See Comments)    Joint pains   Lactose Intolerance (Gi)    Levofloxacin     Joint pain   Macrodantin [Nitrofurantoin Macrocrystal]    Morphine And Related     hallucinate   Other    Pedi-Pre Tape Spray [Wound Dressing Adhesive]    Premarin [Conjugated Estrogens] Itching   Tramadol Hcl Other (See Comments)    Causes insomnia    Vancomycin Hives and Itching   Zantac [Ranitidine Hcl]     Itching    Zofran [Ondansetron Hcl]     HTN   Clarithromycin     Other reaction(s): Abdominal Pain Anxious/nervous feeling   Denosumab Rash   Nitrofuran Derivatives Rash   Sulfa Antibiotics Rash   I reviewed her past medical history, social history, family history, and environmental history and no significant changes have been reported from her previous visit.  Review of Systems  Constitutional:  Negative for appetite change, fever and unexpected weight change.  HENT:  Positive for congestion. Negative for rhinorrhea.   Eyes:  Negative for itching.  Respiratory:  Positive for cough. Negative for chest tightness, shortness of breath and wheezing.   Gastrointestinal:  Negative for abdominal pain.  Skin:  Positive for rash.  Allergic/Immunologic: Negative for environmental allergies.  Neurological:  Negative for headaches.   Objective: BP 128/80   Pulse 60   Temp 98.8 F (37.1 C) (Temporal)   Resp 16   Ht '5\' 1"'$  (1.549 m)   Wt 105 lb 4 oz (47.7 kg)   SpO2 96%   BMI 19.89 kg/m  Body mass index is 19.89 kg/m. Physical Exam Vitals and nursing note reviewed.  Constitutional:      Appearance: Normal appearance. She is well-developed.  HENT:     Head: Normocephalic and atraumatic.     Right Ear: Tympanic membrane and external ear normal.     Left Ear: Tympanic membrane and external ear normal.     Nose: Nose normal.     Mouth/Throat:     Mouth: Mucous membranes are moist.     Pharynx: Oropharynx is clear.  Eyes:     Conjunctiva/sclera: Conjunctivae normal.  Cardiovascular:     Rate and Rhythm: Normal rate and regular rhythm.     Heart sounds: Normal heart sounds. No murmur heard. Pulmonary:     Effort: Pulmonary effort is normal.     Breath sounds: Normal breath sounds. No wheezing, rhonchi or rales.  Musculoskeletal:     Cervical back: Neck supple.  Skin:    General: Skin is warm.     Findings: Rash  present.     Comments: Very frail skin.  Neurological:     General: No focal deficit present.     Mental Status: She is alert.  Previous notes and tests were reviewed. The plan was reviewed with the patient/family, and all questions/concerned were addressed.  It was my pleasure to see Cassandra Sims today and participate in her care. Please feel free to contact me with any questions or concerns.  Sincerely,  Rexene Alberts, DO Allergy & Immunology  [facility] office: [place] office: [phone]

## 2021-03-28 ENCOUNTER — Ambulatory Visit (INDEPENDENT_AMBULATORY_CARE_PROVIDER_SITE_OTHER): Payer: Medicare Other | Admitting: Allergy

## 2021-03-28 ENCOUNTER — Ambulatory Visit (INDEPENDENT_AMBULATORY_CARE_PROVIDER_SITE_OTHER): Payer: Medicare Other

## 2021-03-28 ENCOUNTER — Other Ambulatory Visit: Payer: Self-pay

## 2021-03-28 ENCOUNTER — Encounter: Payer: Self-pay | Admitting: Allergy

## 2021-03-28 VITALS — BP 128/80 | HR 60 | Temp 98.8°F | Resp 16 | Ht 61.0 in | Wt 105.2 lb

## 2021-03-28 DIAGNOSIS — J4540 Moderate persistent asthma, uncomplicated: Secondary | ICD-10-CM

## 2021-03-28 DIAGNOSIS — R12 Heartburn: Secondary | ICD-10-CM | POA: Diagnosis not present

## 2021-03-28 DIAGNOSIS — J4550 Severe persistent asthma, uncomplicated: Secondary | ICD-10-CM

## 2021-03-28 DIAGNOSIS — L282 Other prurigo: Secondary | ICD-10-CM | POA: Diagnosis not present

## 2021-03-28 DIAGNOSIS — J310 Chronic rhinitis: Secondary | ICD-10-CM

## 2021-03-28 DIAGNOSIS — R2681 Unsteadiness on feet: Secondary | ICD-10-CM

## 2021-03-28 NOTE — Patient Instructions (Addendum)
Asthma: Continue Fasenra injections every 8 weeks - given today.  Use albuterol 2 puffs when you are having the coughing fits.  Daily controller medication(s): Take Dulera 155mg 1 puff once a day on Monday/Wednesday/Friday with spacer and rinse mouth afterwards. During upper respiratory infections/asthma flares: start Dulera 1048m 2 puffs twice a day with spacer and rinse mouth afterwards for 1-2 weeks until your breathing symptoms return to baseline.  May use albuterol rescue inhaler 2 puffs every 4 to 6 hours as needed for shortness of breath, chest tightness, coughing, and wheezing. May use albuterol rescue inhaler 2 puffs 5 to 15 minutes prior to strenuous physical activities. Monitor frequency of use.  Asthma control goals:  Full participation in all desired activities (may need albuterol before activity) Albuterol use two times or less a week on average (not counting use with activity) Cough interfering with sleep two times or less a month Oral steroids no more than once a year No hospitalizations  Rash/itching: Continue proper skin care. Get bloodwork We are ordering labs, so please allow 1-2 weeks for the results to come back. With the newly implemented Cures Act, the labs might be visible to you at the same time that they become visible to me. However, I will not address the results until all of the results are back, so please be patient.  Moisturizer: Triamcinolone-Eucerin twice a day.  Sinuses:  Use nasal saline lavage (i.e., NeilMed) 1-2 times per day. May use Afrin 1-2 times per week for severe nasal congestion.   Heartburn  Continue dietary and lifestyle modifications. Continue with tums as needed.   Follow up in 2 months or sooner if needed.  Follow up with PCP regarding the staggering.   Skin care recommendations  Bath time: Always use lukewarm water. AVOID very hot or cold water. Keep bathing time to 5-10 minutes. Do NOT use bubble bath. Use a mild soap and  use just enough to wash the dirty areas. Do NOT scrub skin vigorously.  After bathing, pat dry your skin with a towel. Do NOT rub or scrub the skin.  Moisturizers and prescriptions:  ALWAYS apply moisturizers immediately after bathing (within 3 minutes). This helps to lock-in moisture. Use the moisturizer several times a day over the whole body. Good summer moisturizers include: Aveeno, CeraVe, Cetaphil. Good winter moisturizers include: Aquaphor, Vaseline, Cerave, Cetaphil, Eucerin, Vanicream. When using moisturizers along with medications, the moisturizer should be applied about one hour after applying the medication to prevent diluting effect of the medication or moisturize around where you applied the medications. When not using medications, the moisturizer can be continued twice daily as maintenance.  Laundry and clothing: Avoid laundry products with added color or perfumes. Use unscented hypo-allergenic laundry products such as Tide free, Cheer free & gentle, and All free and clear.  If the skin still seems dry or sensitive, you can try double-rinsing the clothes. Avoid tight or scratchy clothing such as wool. Do not use fabric softeners or dyer sheets.

## 2021-03-28 NOTE — Assessment & Plan Note (Signed)
Past history - 2018 skin testing negative to environmental allergies. Worsening glaucoma. 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 Still having nasal congestion.   Use nasal saline lavage (i.e., NeilMed) 1-2 times per day.  May use Afrin 1-2 times per week for severe nasal congestion.   No steroid nasal sprays due to glaucoma history.

## 2021-03-28 NOTE — Assessment & Plan Note (Signed)
   Follow up with PCP regarding the gait issues.

## 2021-03-28 NOTE — Assessment & Plan Note (Signed)
Some coughing 2-3 times per week. Has not used albuterol for this.   Today's spirometry was normal.   Continue Fasenra injections every 8 weeks - given today.   Use albuterol 2 puffs when you are having the coughing fits.  Daily controller medication(s): Take Dulera 136mg 1 puff once a day on Monday/Wednesday/Friday with spacer and rinse mouth afterwards. . During upper respiratory infections/asthma flares: start Dulera 1073m 2 puffs twice a day with spacer and rinse mouth afterwards for 1-2 weeks until your breathing symptoms return to baseline.  . May use albuterol rescue inhaler 2 puffs every 4 to 6 hours as needed for shortness of breath, chest tightness, coughing, and wheezing. May use albuterol rescue inhaler 2 puffs 5 to 15 minutes prior to strenuous physical activities. Monitor frequency of use.  . Get spirometry at next visit.

## 2021-03-28 NOTE — Assessment & Plan Note (Signed)
Past history - underlying itching but noticed rash on her arms 1 month ago. Saw dermatology and gave reassurance with topical steroid cream which helps. Interim history - topical triamcinolone helps but still itchy. June 2022 bloodwork - CMP, CBC diff, TSH  normal.  . Continue proper skin care. . Get bloodwork as below.  . Moisturizer: Triamcinolone-Eucerin twice a day. . Discussed that most medications that help with itching are antihistamines which would worsen her dry eye symptoms - will hold off for now.

## 2021-03-28 NOTE — Assessment & Plan Note (Signed)
   Continue dietary and lifestyle modifications.  May take tums as before - also for osteoporosis.

## 2021-04-01 LAB — ALPHA-GAL PANEL
Allergen Lamb IgE: <0.1 kU/L
Beef IgE: <0.1 kU/L
IgE (Immunoglobulin E), Serum: 9 IU/mL (ref 6–495)
O215-IgE Alpha-Gal: <0.1 kU/L
Pork IgE: <0.1 kU/L

## 2021-04-01 LAB — SEDIMENTATION RATE: Sed Rate: 2 mm/hr (ref 0–40)

## 2021-04-01 LAB — C-REACTIVE PROTEIN: CRP: <1 mg/L (ref 0–10)

## 2021-04-01 LAB — TRYPTASE: Tryptase: 5 ug/L (ref 2.2–13.2)

## 2021-04-10 DIAGNOSIS — R202 Paresthesia of skin: Secondary | ICD-10-CM | POA: Insufficient documentation

## 2021-05-23 ENCOUNTER — Other Ambulatory Visit: Payer: Self-pay

## 2021-05-23 ENCOUNTER — Ambulatory Visit (INDEPENDENT_AMBULATORY_CARE_PROVIDER_SITE_OTHER): Payer: Medicare Other

## 2021-05-23 DIAGNOSIS — J4550 Severe persistent asthma, uncomplicated: Secondary | ICD-10-CM

## 2021-06-07 ENCOUNTER — Telehealth: Payer: Self-pay | Admitting: Allergy

## 2021-06-07 MED ORDER — HYDROXYZINE HCL 10 MG PO TABS: 10.0000 mg | ORAL_TABLET | Freq: Every evening | ORAL | 1 refills | Status: DC | PRN

## 2021-06-07 NOTE — Telephone Encounter (Signed)
Cassandra Sims called in and states that she keeps itching really bad.  She states that when she puts on the Kenalog cream, it doesn't last long and she starts itching again.  Cassandra Sims states her arms are bruised form her scratching so much.  She would like to know if there is anything else she can use.  Please advise.

## 2021-06-07 NOTE — Telephone Encounter (Signed)
Attempted to call patient and the phone ran continuously then ended. Will attempt to call again.

## 2021-06-07 NOTE — Telephone Encounter (Signed)
Please call patient.  Is she using the Moisturizer: Triamcinolone-Eucerin twice a day?  I will try a low dose itching medication called hydroxyzine. Take at night and see if it helps.  Have her schedule a follow up with me on Monday or Wednesday and I want to discuss switching her Berna Bue injection to Dupixent that may help with her itching.

## 2021-06-08 NOTE — Telephone Encounter (Signed)
Called and spoke with patient and she confirmed that she is using the triamcinolone mixed with eucerin 2 times daily. She picked up the Hydroxyzine but was hesitant to use it, she scheduled to come see you this coming Monday and will discuss things further with you then.

## 2021-06-11 ENCOUNTER — Ambulatory Visit: Payer: Medicare Other | Admitting: Allergy

## 2021-06-11 ENCOUNTER — Other Ambulatory Visit: Payer: Self-pay

## 2021-06-11 ENCOUNTER — Encounter: Payer: Self-pay | Admitting: Allergy

## 2021-06-11 VITALS — BP 130/70 | HR 59 | Temp 98.2°F | Resp 16 | Ht 62.5 in | Wt 104.4 lb

## 2021-06-11 DIAGNOSIS — J310 Chronic rhinitis: Secondary | ICD-10-CM

## 2021-06-11 DIAGNOSIS — R12 Heartburn: Secondary | ICD-10-CM | POA: Diagnosis not present

## 2021-06-11 DIAGNOSIS — J4550 Severe persistent asthma, uncomplicated: Secondary | ICD-10-CM | POA: Diagnosis not present

## 2021-06-11 DIAGNOSIS — L299 Pruritus, unspecified: Secondary | ICD-10-CM

## 2021-06-11 DIAGNOSIS — R2681 Unsteadiness on feet: Secondary | ICD-10-CM

## 2021-06-11 NOTE — Assessment & Plan Note (Addendum)
Past history - 2018 skin testing negative to environmental allergies. Worsening glaucoma. Sinus plumber ineffective.  Interim history - Still having nasal congestion.   Use nasal saline lavage (i.e., NeilMed) 1-2 times per day.  May use Afrin 1-2 times per week for severe nasal congestion.   No steroid nasal sprays due to glaucoma history.

## 2021-06-11 NOTE — Assessment & Plan Note (Signed)
Past history - underlying itching but noticed rash on her arms 1 month ago. Saw dermatology and gave reassurance with topical steroid cream which helps. June 2022 bloodwork - CMP, CBC diff, TSH  normal.  Interim history - still itching mainly on the left arm and upper back area. Did not try hydroxyzine as patient is very concerned about the side effects.  . Hydroxyzine 10mg  1/2 tablet 1 hour before bedtime as needed for itching. . Ask PCP regarding stopping Lexapro. o Not sure if it's causing the itching.  . Continue proper skin care. . Moisturizer: Triamcinolone-Eucerin twice a day.

## 2021-06-11 NOTE — Assessment & Plan Note (Signed)
   Follow up with PCP regarding the gait issues.

## 2021-06-11 NOTE — Assessment & Plan Note (Signed)
   Continue dietary and lifestyle modifications.  May take tums as before - also for osteoporosis.

## 2021-06-11 NOTE — Assessment & Plan Note (Signed)
Some coughing 2-3 times per week and using albuterol with some benefit. Not sure if Berna Bue made a difference.   Today's spirometry showed some restriction.   Continue Fasenra injections every 8 weeks.  Planning to switch to Dupixent - start PA.   Use albuterol 2 puffs when you are having the coughing fits.  Daily controller medication(s): Take Dulera 143mcg 1 puff once a day on Monday/Wednesday/Friday with spacer and rinse mouth afterwards. . During upper respiratory infections/asthma flares: start Dulera 152mcg 2 puffs twice a day with spacer and rinse mouth afterwards for 1-2 weeks until your breathing symptoms return to baseline.  . May use albuterol rescue inhaler 2 puffs every 4 to 6 hours as needed for shortness of breath, chest tightness, coughing, and wheezing. May use albuterol rescue inhaler 2 puffs 5 to 15 minutes prior to strenuous physical activities. Monitor frequency of use.   Get spirometry at next visit.

## 2021-06-11 NOTE — Progress Notes (Signed)
Follow Up Note  RE: Cassandra Sims MRN: [id number] DOB: February 13, 1933 Date of Office Visit: 06/11/2021  Referring provider: Patrecia Pour, Christean Grief, MD Primary care provider: Pcp, No  Chief Complaint: Follow-up (Patient is in today for a follow up and states she is doing well with her asthma. )  History of Present Illness: I had the pleasure of seeing Cassandra Sims for a follow up visit at the [facility] on 06/11/2021. She is a 87 y.o. female, who is being followed for asthma on Fasenra, pruritic rash, non-allergic rhinitis, heartburn. Her previous allergy office visit was on 03/28/2021 with Dr. Maudie Mercury. Today is a new complaint visit of itching .  Asthma Currently taking Dulera 166mcg 1 puff on Monday/Wednesday/Friday. Using albuterol 2-3 times per week for coughing   Denies any ER/urgent care visits or prednisone use since the last visit. Not sure if Berna Bue is helping.    Pruritis Patient has been itching on her arms, and shoulders. No rash with this.  Using Cerave lotion and triamcinolone BID minimal benefit.  Did not take hydroxyzine due to concerns about drowsiness and dry eyes.  Follows with dermatology. She was also concerned if her Lexapro is making the itching worse.    Nonallergic rhinitis Taking Afrin 1-2 times per week with minimal benefit.  Glaucoma is still there but stable. Does not want any steroid nasal sprays.    Unsteady gait Being worked up for this.  Patient also concerned about some weight loss - weight stable for the past 2 months according to EMR.  04/10/2021 dermatology visit: "Assessment: 1. Notalgia paresthetica  2. Itchy skin  3. Seborrheic keratoses, inflamed   Plan: The above diagnosis and treatment options were discussed with the patient. Recommend sunscreens, protective clothing and a wide brimmed hat while out in the sun.  Orders Placed This Encounter  Medications   gabapentin (NEURONTIN) 100 MG capsule"  Assessment and  Plan: Cassandra Sims is a 87 y.o. female with: Severe persistent asthma without complication Some coughing 2-3 times per week and using albuterol with some benefit. Not sure if Berna Bue made a difference.  Today's spirometry showed some restriction.  Continue Fasenra injections every 8 weeks. Planning to switch to Dupixent - start PA.  Use albuterol 2 puffs when you are having the coughing fits. Daily controller medication(s): Take Dulera 173mcg 1 puff once a day on Monday/Wednesday/Friday with spacer and rinse mouth afterwards. During upper respiratory infections/asthma flares: start Dulera 170mcg 2 puffs twice a day with spacer and rinse mouth afterwards for 1-2 weeks until your breathing symptoms return to baseline.  May use albuterol rescue inhaler 2 puffs every 4 to 6 hours as needed for shortness of breath, chest tightness, coughing, and wheezing. May use albuterol rescue inhaler 2 puffs 5 to 15 minutes prior to strenuous physical activities. Monitor frequency of use.  Get spirometry at next visit.  Pruritus Past history - underlying itching but noticed rash on her arms 1 month ago. Saw dermatology and gave reassurance with topical steroid cream which helps. June 2022 bloodwork - CMP, CBC diff, TSH  normal.  Interim history - still itching mainly on the left arm and upper back area. Did not try hydroxyzine as patient is very concerned about the side effects.  Hydroxyzine 10mg  1/2 tablet 1 hour before bedtime as needed for itching. Ask PCP regarding stopping Lexapro. Not sure if it's causing the itching.  Continue proper skin care. Moisturizer: Triamcinolone-Eucerin twice a day.  Nonallergic rhinitis Past history -  2018 skin testing negative to environmental allergies. Worsening glaucoma. Sinus plumber ineffective.  Interim history - Still having nasal congestion.  Use nasal saline lavage (i.e., NeilMed) 1-2 times per day. May use Afrin 1-2 times per week for severe nasal congestion.  No  steroid nasal sprays due to glaucoma history.  Unsteady gait Follow up with PCP regarding the gait issues.  Heartburn Continue dietary and lifestyle modifications. May take tums as before - also for osteoporosis.  Return in about 2 months (around 08/11/2021).  No orders of the defined types were placed in this encounter.  Lab Orders  No laboratory test(s) ordered today    Diagnostics: Spirometry:  Tracings reviewed. Her effort: Good reproducible efforts. FVC: 1.56L FEV1: 1.19L, 78% predicted FEV1/FVC ratio: 76% Interpretation: Spirometry consistent with possible restrictive disease.  Please see scanned spirometry results for details.  Medication List:  Current Outpatient Medications  Medication Sig Dispense Refill   acetaminophen (TYLENOL) 500 MG tablet Take 1,000 mg by mouth every 8 (eight) hours as needed for mild pain.     albuterol (VENTOLIN HFA) 108 (90 Base) MCG/ACT inhaler Inhale 2 puffs into the lungs every 4 (four) hours as needed for wheezing. Reported on 10/10/2015 18 g 1   amLODipine (NORVASC) 2.5 MG tablet Take by mouth.     Benralizumab 30 MG/ML SOSY Inject 30 mg into the skin every 8 (eight) weeks.     bimatoprost (LUMIGAN) 0.03 % ophthalmic solution 1 drop at bedtime.     Calcium Carbonate Antacid (TUMS PO) Take by mouth. Take 2 tums twice daily.     carboxymethylcellulose (REFRESH PLUS) 0.5 % SOLN Place 1 drop into both eyes 5 (five) times daily as needed (for dry eyes).     Cholecalciferol 75 MCG (3000 UT) TABS Take 1 tablet by mouth daily.     denosumab (PROLIA) 60 MG/ML SOSY injection Inject into the skin.     dorzolamide (TRUSOPT) 2 % ophthalmic solution Place 1 drop into both eyes 2 times daily.     escitalopram (LEXAPRO) 5 MG tablet Take 2.5 mg by mouth daily.     hydrOXYzine (ATARAX/VISTARIL) 10 MG tablet Take 1 tablet (10 mg total) by mouth at bedtime as needed for itching. 30 tablet 1   labetalol (NORMODYNE) 100 MG tablet Take 1 tablet by mouth 2 (two)  times daily.     Lactobacillus (ACIDOPHILUS PO) Take 1 capsule by mouth 3 (three) times a week.      LUMIGAN 0.01 % SOLN 1 drop at bedtime.     LUTEIN PO Take 20 mg by mouth daily.     mometasone-formoterol (DULERA) 100-5 MCG/ACT AERO Inhale 1 puff into the lungs daily. with spacer and rinse mouth afterwards. 1 each 5   Multiple Vitamin (MULTIVITAMIN WITH MINERALS) TABS tablet Take 1 tablet by mouth daily. Nature Made Multi Vitamin for Her 50+ W/No Iron     olmesartan (BENICAR) 40 MG tablet Take 1 tablet by mouth daily.     psyllium (METAMUCIL) 58.6 % powder Take 1 packet by mouth daily. 14.3 g (1 heaping tablespoon) every morning.     telmisartan (MICARDIS) 80 MG tablet Take by mouth.     triamcinolone cream (KENALOG) 0.1 % Apply to affected skin of body twice daily as needed for itching. NEVER to face/neck/groin.     triamcinolone ointment (KENALOG) 0.1 % Apply 1 application topically 2 (two) times daily. Moisturize twice a day with this. 453 g 2   Turmeric 450 MG CAPS Take 450 mg  by mouth daily with lunch.      Current Facility-Administered Medications  Medication Dose Route Frequency Provider Last Rate Last Admin   Benralizumab SOSY 30 mg  30 mg Subcutaneous Q8 Weeks Kozlow, Donnamarie Poag, MD   30 mg at 05/23/21 1500   Allergies: Allergies  Allergen Reactions   Penicillins Shortness Of Breath and Swelling    Has patient had a PCN reaction causing immediate rash, facial/tongue/throat swelling, SOB or lightheadedness with hypotension: Yes Has patient had a PCN reaction causing severe rash involving mucus membranes or skin necrosis: No Has patient had a PCN reaction that required hospitalization: No Has patient had a PCN reaction occurring within the last 10 years: No If all of the above answers are "NO", then may proceed with Cephalosporin use.    Zantac [Ranitidine] Itching   Apraclonidine Other (See Comments)    Very light sensitive, swollen eye   Brinzolamide Other (See Comments)    Doxycycline Other (See Comments)    Pt states she tolerates OKAY   Lactose Other (See Comments)   Acetaminophen    Adhesive [Tape] Itching    BAND-AID TAPE-skin redness/itching   Clindamycin/Lincomycin Other (See Comments)    Severe joint pain.   Codeine Nausea And Vomiting   Darvocet [Propoxyphene N-Acetaminophen] Other (See Comments)    HTN   Dilaudid [Hydromorphone Hcl] Nausea And Vomiting    DROP BLOOD PRESSURE HYPOTENSIVE   Flagyl [Metronidazole] Other (See Comments)    Joint pains   Lactose Intolerance (Gi)    Levofloxacin     Joint pain   Macrodantin [Nitrofurantoin Macrocrystal]    Morphine And Related     hallucinate   Other    Pedi-Pre Tape Spray [Wound Dressing Adhesive]    Premarin [Conjugated Estrogens] Itching   Tramadol Hcl Other (See Comments)    Causes insomnia   Vancomycin Hives and Itching   Zantac [Ranitidine Hcl]     Itching    Zofran [Ondansetron Hcl]     HTN   Clarithromycin     Other reaction(s): Abdominal Pain Anxious/nervous feeling   Denosumab Rash   Nitrofuran Derivatives Rash   Sulfa Antibiotics Rash   I reviewed her past medical history, social history, family history, and environmental history and no significant changes have been reported from her previous visit.  Review of Systems  Constitutional:  Positive for unexpected weight change. Negative for appetite change and fever.  HENT:  Positive for congestion. Negative for rhinorrhea.   Eyes:  Negative for itching.  Respiratory:  Negative for cough, chest tightness, shortness of breath and wheezing.   Gastrointestinal:  Negative for abdominal pain.  Skin:  Negative for rash.       Pruritus  Allergic/Immunologic: Negative for environmental allergies.  Neurological:  Negative for headaches.   Objective: BP 130/70   Pulse (!) 59   Temp 98.2 F (36.8 C) (Temporal)   Resp 16   Ht 5' 2.5" (1.588 m)   Wt 104 lb 6.4 oz (47.4 kg)   SpO2 96%   BMI 18.79 kg/m  Body mass index is 18.79  kg/m. Physical Exam Vitals and nursing note reviewed.  Constitutional:      Appearance: Normal appearance. She is well-developed.  HENT:     Head: Normocephalic and atraumatic.     Right Ear: Tympanic membrane and external ear normal.     Left Ear: Tympanic membrane and external ear normal.     Nose: Nose normal.     Mouth/Throat:  Mouth: Mucous membranes are moist.     Pharynx: Oropharynx is clear.  Eyes:     Conjunctiva/sclera: Conjunctivae normal.  Cardiovascular:     Rate and Rhythm: Normal rate and regular rhythm.     Heart sounds: Normal heart sounds. No murmur heard. Pulmonary:     Effort: Pulmonary effort is normal.     Breath sounds: Normal breath sounds. No wheezing, rhonchi or rales.  Musculoskeletal:     Cervical back: Neck supple.  Skin:    General: Skin is warm and dry.     Comments: Very frail skin.  Neurological:     General: No focal deficit present.     Mental Status: She is alert.   Previous notes and tests were reviewed. The plan was reviewed with the patient/family, and all questions/concerned were addressed.  It was my pleasure to see Cassandra Sims today and participate in her care. Please feel free to contact me with any questions or concerns.  Sincerely,  Rexene Alberts, DO Allergy & Immunology  [facility] office: [place] office: [phone]

## 2021-06-11 NOTE — Patient Instructions (Addendum)
Asthma: Continue Fasenra injections every 8 weeks. Will try to swtich to [place] to see if it helps with the itching.  Use albuterol 2 puffs when you are having the coughing fits.  Daily controller medication(s): Take Dulera 116mcg 1 puff once a day on Monday/Wednesday/Friday with spacer and rinse mouth afterwards. During upper respiratory infections/asthma flares: start Dulera 140mcg 2 puffs twice a day with spacer and rinse mouth afterwards for 1-2 weeks until your breathing symptoms return to baseline.  May use albuterol rescue inhaler 2 puffs every 4 to 6 hours as needed for shortness of breath, chest tightness, coughing, and wheezing. May use albuterol rescue inhaler 2 puffs 5 to 15 minutes prior to strenuous physical activities. Monitor frequency of use.  Asthma control goals:  Full participation in all desired activities (may need albuterol before activity) Albuterol use two times or less a week on average (not counting use with activity) Cough interfering with sleep two times or less a month Oral steroids no more than once a year No hospitalizations  Rash/itching: Take hydroxyzine 10mg  1/2 tablet 1 hour before bedtime as needed for itching. Ask your PCP regarding stopping Lexapro. Not sure if it's causing the itching.  Continue proper skin care. Moisturizer: Triamcinolone-Eucerin twice a day.  Sinuses:  Use nasal saline lavage (i.e., NeilMed) 1-2 times per day. May use Afrin 1-2 times per week for severe nasal congestion.   Heartburn  Continue dietary and lifestyle modifications. Continue with tums as needed.   Follow up in 2 months or sooner if needed.   Skin care recommendations  Bath time: Always use lukewarm water. AVOID very hot or cold water. Keep bathing time to 5-10 minutes. Do NOT use bubble bath. Use a mild soap and use just enough to wash the dirty areas. Do NOT scrub skin vigorously.  After bathing, pat dry your skin with a towel. Do NOT rub or scrub the  skin.  Moisturizers and prescriptions:  ALWAYS apply moisturizers immediately after bathing (within 3 minutes). This helps to lock-in moisture. Use the moisturizer several times a day over the whole body. Good summer moisturizers include: Aveeno, CeraVe, Cetaphil. Good winter moisturizers include: Aquaphor, Vaseline, Cerave, Cetaphil, Eucerin, Vanicream. When using moisturizers along with medications, the moisturizer should be applied about one hour after applying the medication to prevent diluting effect of the medication or moisturize around where you applied the medications. When not using medications, the moisturizer can be continued twice daily as maintenance.  Laundry and clothing: Avoid laundry products with added color or perfumes. Use unscented hypo-allergenic laundry products such as Tide free, Cheer free & gentle, and All free and clear.  If the skin still seems dry or sensitive, you can try double-rinsing the clothes. Avoid tight or scratchy clothing such as wool. Do not use fabric softeners or dyer sheets.

## 2021-06-12 ENCOUNTER — Telehealth: Payer: Self-pay | Admitting: *Deleted

## 2021-06-12 NOTE — Telephone Encounter (Signed)
Noted  

## 2021-06-12 NOTE — Telephone Encounter (Signed)
Called patient and discussed  change from [place] to [place] and will try to get same through patient assistance. Will mail application to patient to return to me

## 2021-06-12 NOTE — Telephone Encounter (Signed)
-----   Message from Garnet Sierras, DO sent at 06/11/2021 12:43 PM EST ----- Letisha Yera, can you check if Dupixent would be covered for her for her asthma? Currently on Fasenra. Thank you.

## 2021-06-25 NOTE — Telephone Encounter (Signed)
Patient called asking how and when she would get her [place]. She stated that she receive the application in the mail, filled it out and brought it back. Patient stated that [employer] called her and when she called back they informed her that she would need a prescription. Tammy please advise on next steps.

## 2021-06-26 ENCOUNTER — Other Ambulatory Visit: Payer: Self-pay | Admitting: *Deleted

## 2021-06-26 MED ORDER — DUPIXENT 300 MG/2ML ~~LOC~~ SOSY: 600.0000 mg | PREFILLED_SYRINGE | Freq: Once | SUBCUTANEOUS | 11 refills | Status: DC

## 2021-06-26 NOTE — Telephone Encounter (Signed)
Left a message informing patient that I have reached out to [facility] and she will call patient after checking the status of her application.

## 2021-06-26 NOTE — Telephone Encounter (Signed)
Patient has been informed. I also gave her the [facility] address/phone number.

## 2021-06-30 ENCOUNTER — Other Ambulatory Visit: Payer: Self-pay | Admitting: Allergy

## 2021-07-09 NOTE — Telephone Encounter (Signed)
Patient called again today to check on her [place]. She has not hear anything back. She said all she needs is for Dr. Maudie Mercury to write the prescription and fax it to [phone]. I did tell her Tammy was working on this, but she was out of the office today.

## 2021-07-11 NOTE — Telephone Encounter (Signed)
[facility] staff,  Please call patient and tell her that while we wait for the medication to get approved, will start off with a sample Dupixent injection.  Please schedule appointment for Dupixent sample 600mg  loading dose then 300mg  every 2 weeks.  Thank you.

## 2021-07-11 NOTE — Telephone Encounter (Signed)
Called the patient and she plans to call [facility] on 07/11/21 to schedule an appointment for a Dupixent sample per Dr. Maudie Mercury. Patient would like to know the cost of the sample before making an appointment please advise.

## 2021-07-12 NOTE — Telephone Encounter (Signed)
Thank you. Noted.

## 2021-07-18 ENCOUNTER — Ambulatory Visit (INDEPENDENT_AMBULATORY_CARE_PROVIDER_SITE_OTHER): Payer: Medicare Other

## 2021-07-18 ENCOUNTER — Other Ambulatory Visit: Payer: Self-pay

## 2021-07-18 ENCOUNTER — Ambulatory Visit: Payer: Medicare Other

## 2021-07-18 DIAGNOSIS — L209 Atopic dermatitis, unspecified: Secondary | ICD-10-CM

## 2021-07-18 MED ORDER — DUPILUMAB 300 MG/2ML ~~LOC~~ SOSY
600.0000 mg | PREFILLED_SYRINGE | Freq: Once | SUBCUTANEOUS | Status: AC
  Administered 2021-07-18: 09:00:00 600 mg via SUBCUTANEOUS

## 2021-07-18 NOTE — Progress Notes (Signed)
Patient started a sample of Dupixent today (600 mg loading dose). Will continue 300 mg Q 2 weeks. Patient waited 30 minutes with no problems.

## 2021-07-23 ENCOUNTER — Ambulatory Visit: Payer: Medicare Other

## 2021-07-25 ENCOUNTER — Ambulatory Visit (INDEPENDENT_AMBULATORY_CARE_PROVIDER_SITE_OTHER): Payer: Medicare Other | Admitting: Family Medicine

## 2021-07-25 ENCOUNTER — Other Ambulatory Visit: Payer: Self-pay

## 2021-07-25 ENCOUNTER — Encounter: Payer: Self-pay | Admitting: Family Medicine

## 2021-07-25 VITALS — BP 120/68 | HR 55 | Temp 97.1°F | Ht 62.5 in | Wt 102.4 lb

## 2021-07-25 DIAGNOSIS — J4540 Moderate persistent asthma, uncomplicated: Secondary | ICD-10-CM

## 2021-07-25 DIAGNOSIS — M810 Age-related osteoporosis without current pathological fracture: Secondary | ICD-10-CM | POA: Diagnosis not present

## 2021-07-25 DIAGNOSIS — E871 Hypo-osmolality and hyponatremia: Secondary | ICD-10-CM

## 2021-07-25 DIAGNOSIS — I10 Essential (primary) hypertension: Secondary | ICD-10-CM | POA: Diagnosis not present

## 2021-07-25 DIAGNOSIS — E559 Vitamin D deficiency, unspecified: Secondary | ICD-10-CM

## 2021-07-25 DIAGNOSIS — R634 Abnormal weight loss: Secondary | ICD-10-CM

## 2021-07-25 DIAGNOSIS — D509 Iron deficiency anemia, unspecified: Secondary | ICD-10-CM

## 2021-07-25 DIAGNOSIS — E785 Hyperlipidemia, unspecified: Secondary | ICD-10-CM

## 2021-07-25 NOTE — Progress Notes (Signed)
[facility] [address] [address] Dept: [phone] Dept Fax: [phone]  New Patient Office Visit  Subjective:    Patient ID: Cassandra Sims, female    DOB: [birth year], 88 y.o..   MRN: [id number]  Chief Complaint  Patient presents with   Establish Care    NP- establish care.  C/o having skin itchy and some gait disturbances.      History of Present Illness:  Patient is in today to establish care. Cassandra Sims was born in [place]. She moved to [place] many years ago (she can't recall exactly when). Cassandra Sims is divorced. She has 4 children, 8 grandchildren, and 5 great-grandchildren. She lives alone. She admits that there are challenges for her at home. Two of her children live within 5 miles of her home. She feels they can respond to assist her, though she notes she hates to call on them when they are at work. Cassandra Sims still drives, but notes she doesn't like to go far from home. Cassandra Sims denies any use of alcohol, tobacco, or drugs.  Cassandra Sims has a history of hypertension and is managed on amlodipine and labetalol. Her chart has listed two different ARBs (olmesartan and telmisartan). Review of her cardiology notes from Nov. indicate it is the olmesartan that she is taking. She has a history of a basilar artery aneurysm as well.  Cassandra Sims has a history of moderate persistent asthma. She is managed on Dulera and albuterol. She admits that she only only uses the [facility] about twice a week. She rarely needs her albuterol.   Cassandra Sims has a history of osteoporosis. She is managed by Dr. Posey Pronto (endocrinology) for this. She is receiving Prolia injections every 6 months. She was uncertain about whether she might want to receive these through our clinic.  Cassandra Sims has a history of hyperlipidemia and coronary artery calcifications. She is not currently on a statin, as her last lipids were at goal off of  meds.  Cassandra Sims complains of issues with weight loss. Although she characterizes this as recent, her story does not stay consistent. She feels like she eats well enough, so is worried about why she might have lost 20 lbs.  Past Medical History: Patient Active Problem List   Diagnosis Date Noted   Pruritus 06/11/2021   Notalgia paresthetica 04/10/2021   Unsteady gait 03/28/2021   Moderate persistent asthma 09/27/2020   Nonallergic rhinitis 09/27/2020   Hammer toes of both feet 09/27/2020   Vitamin D deficiency 09/11/2020   Coronary artery calcification seen on CT scan 03/21/2020   Basilar artery aneurysm ([place]) 06/14/2019   Tinnitus of left ear 06/11/2019   Presbycusis of both ears 06/11/2019   Rotator cuff arthropathy of left shoulder 01/27/2019   Anxiety 01/27/2019   Psychophysiological insomnia 12/24/2018   TMJ pain dysfunction syndrome 08/13/2018   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 03/30/2018   Iron deficiency anemia 03/19/2018   Chronic fatigue 02/04/2018   Hyponatremia 01/13/2018   Prolonged QT interval 01/13/2018   S/P knee replacement 01/13/2018   Hypokalemia    OA (osteoarthritis) of knee 01/05/2018   Osteoporosis 09/19/2017   GERD (gastroesophageal reflux disease) 07/03/2016   Hemidiaphragm paralysis 07/03/2016   Nodule of right lung 06/19/2016   Prediabetes 11/29/2015   OAB (overactive bladder) 06/23/2015   Status post intraocular lens implant 03/24/2014   Essential hypertension 12/23/2013   Hyperlipidemia LDL goal <130 12/23/2013   Pseudophakia 10/29/2013  Primary open angle glaucoma 04/26/2013   Sinusitis, chronic 11/25/2012   Nuclear senile cataract 02/26/2012   Puckering of macula, right eye 06/10/2011   Past Surgical History:  Procedure Laterality Date   ABDOMINAL HYSTERECTOMY     APPENDECTOMY     BREAST SURGERY     biopsy left    COLECTOMY  2008   obstruction   DIAGNOSTIC LAPAROSCOPY     exp   DILATION AND CURETTAGE OF  UTERUS     EYE SURGERY     cataract   bil and retina surgery right eye, glaucoma surgery, right eye   FUNCTIONAL ENDOSCOPIC SINUS SURGERY  2009   bilat ethm,frontal,max   KNEE ARTHROSCOPY Right 03/30/2018   Procedure: right knee scope; synovectomy;  Surgeon: Gaynelle Arabian, MD;  Location: [facility];  Service: Orthopedics;  Laterality: Right;  63min   NASAL SINUS SURGERY Bilateral 10/26/2012   Procedure: BILATERAL ENDOSCOPIC REVISION ETHMOID MAXILLARY AND FRONTAL SINUS SURGERY  ;  Surgeon: Izora Gala, MD;  Location: [facility];  Service: ENT;  Laterality: Bilateral;   OVARIAN CYST SURGERY     TOTAL KNEE ARTHROPLASTY Right 01/05/2018   Procedure: RIGHT TOTAL KNEE ARTHROPLASTY;  Surgeon: Gaynelle Arabian, MD;  Location: [facility];  Service: Orthopedics;  Laterality: Right;   TUBAL LIGATION     URETER SURGERY     cut accidentally during exp lap-ovarien cyst   VEIN LIGATION     Family History  Problem Relation Age of Onset   Glaucoma Mother    Arthritis Mother    Lung disease Sister    Breast cancer Daughter    Diabetes Mellitus I Other    Leukemia Brother    Allergic rhinitis Neg Hx    Angioedema Neg Hx    Asthma Neg Hx    Atopy Neg Hx    Eczema Neg Hx    Urticaria Neg Hx    Immunodeficiency Neg Hx    Outpatient Medications Prior to Visit  Medication Sig Dispense Refill   acetaminophen (TYLENOL) 500 MG tablet Take 1,000 mg by mouth every 8 (eight) hours as needed for mild pain.     albuterol (VENTOLIN HFA) 108 (90 Base) MCG/ACT inhaler Inhale 2 puffs into the lungs every 4 (four) hours as needed for wheezing. Reported on 10/10/2015 18 g 1   amLODipine (NORVASC) 2.5 MG tablet Take by mouth.     bimatoprost (LUMIGAN) 0.03 % ophthalmic solution 1 drop at bedtime.     Calcium Carbonate Antacid (TUMS PO) Take by mouth. Take 2 tums twice daily.     carboxymethylcellulose (REFRESH PLUS) 0.5 % SOLN Place 1 drop into both eyes 5 (five) times daily as needed (for dry eyes).      Cholecalciferol 75 MCG (3000 UT) TABS Take 1 tablet by mouth daily.     denosumab (PROLIA) 60 MG/ML SOSY injection Inject into the skin.     dorzolamide (TRUSOPT) 2 % ophthalmic solution Place 1 drop into both eyes 2 times daily.     escitalopram (LEXAPRO) 5 MG tablet Take 2.5 mg by mouth daily.     hydrOXYzine (ATARAX) 10 MG tablet TAKE 1 TABLET (10 MG TOTAL) BY MOUTH AT BEDTIME AS NEEDED FOR ITCHING. 90 tablet 1   labetalol (NORMODYNE) 100 MG tablet Take 1 tablet by mouth 2 (two) times daily.     Lactobacillus (ACIDOPHILUS PO) Take 1 capsule by mouth 3 (three) times a week.      LUMIGAN 0.01 % SOLN 1 drop at bedtime.  LUTEIN PO Take 20 mg by mouth daily.     mometasone-formoterol (DULERA) 100-5 MCG/ACT AERO Inhale 1 puff into the lungs daily. with spacer and rinse mouth afterwards. 1 each 5   Multiple Vitamin (MULTIVITAMIN WITH MINERALS) TABS tablet Take 1 tablet by mouth daily. Nature Made Multi Vitamin for Her 50+ W/No Iron     olmesartan (BENICAR) 40 MG tablet Take 1 tablet by mouth daily.     psyllium (METAMUCIL) 58.6 % powder Take 1 packet by mouth daily. 14.3 g (1 heaping tablespoon) every morning.     telmisartan (MICARDIS) 80 MG tablet Take by mouth.     triamcinolone cream (KENALOG) 0.1 % Apply to affected skin of body twice daily as needed for itching. NEVER to face/neck/groin.     triamcinolone ointment (KENALOG) 0.1 % Apply 1 application topically 2 (two) times daily. Moisturize twice a day with this. 453 g 2   Turmeric 450 MG CAPS Take 450 mg by mouth daily with lunch.      No facility-administered medications prior to visit.   Allergies  Allergen Reactions   Penicillins Shortness Of Breath and Swelling    Has patient had a PCN reaction causing immediate rash, facial/tongue/throat swelling, SOB or lightheadedness with hypotension: Yes Has patient had a PCN reaction causing severe rash involving mucus membranes or skin necrosis: No Has patient had a PCN reaction that  required hospitalization: No Has patient had a PCN reaction occurring within the last 10 years: No If all of the above answers are "NO", then may proceed with Cephalosporin use.    Zantac [Ranitidine] Itching   Apraclonidine Other (See Comments)    Very light sensitive, swollen eye   Brinzolamide Other (See Comments)   Doxycycline Other (See Comments)    Pt states she tolerates OKAY   Lactose Other (See Comments)   Acetaminophen    Adhesive [Tape] Itching    BAND-AID TAPE-skin redness/itching   Clindamycin/Lincomycin Other (See Comments)    Severe joint pain.   Codeine Nausea And Vomiting   Darvocet [Propoxyphene N-Acetaminophen] Other (See Comments)    HTN   Dilaudid [Hydromorphone Hcl] Nausea And Vomiting    DROP BLOOD PRESSURE HYPOTENSIVE   Flagyl [Metronidazole] Other (See Comments)    Joint pains   Lactose Intolerance (Gi)    Levofloxacin     Joint pain   Macrodantin [Nitrofurantoin Macrocrystal]    Morphine And Related     hallucinate   Other    Pedi-Pre Tape Spray [Wound Dressing Adhesive]    Premarin [Conjugated Estrogens] Itching   Tramadol Hcl Other (See Comments)    Causes insomnia   Vancomycin Hives and Itching   Zofran [Ondansetron Hcl]     HTN   Clarithromycin     Other reaction(s): Abdominal Pain Anxious/nervous feeling   Denosumab Rash   Sulfa Antibiotics Rash    Objective:   Today's Vitals   07/25/21 0902  BP: 120/68  Pulse: (!) 55  Temp: (!) 97.1 F (36.2 C)  TempSrc: Temporal  SpO2: 98%  Weight: 102 lb 6.4 oz (46.4 kg)  Height: 5' 2.5" (1.588 m)   Body mass index is 18.43 kg/m.   General: Well developed, well nourished. No acute distress. Lungs: Clear to auscultation bilaterally. No wheezing, rales or rhonchi. CV: RRR without murmurs or rubs. Pulses 2+ bilaterally. Psych: Alert and oriented. Normal mood and affect.  Health Maintenance Due  Topic Date Due   DEXA SCAN  Never done   TETANUS/TDAP  04/29/2021  Lab Results     Ref  Range & Units 12/23/2019  LDL Direct <130 mg/dL 105   Total Cholesterol 25 - 199 MG/DL 201 High    Triglycerides 10 - 150 MG/DL 46   HDL Cholesterol 35 - 135 MG/DL 76   Total Chol / HDL Cholesterol <4.5 2.6   Non-HDL Cholesterol MG/DL 125   Comment:  TARGET: <(LDL-C TARGET + 30)MG/DL   Assessment & Plan:   1. Essential hypertension Blood pressure is at goal. Continue amlodipine, labetalol, and olmesartan.  2. Moderate persistent asthma without complication Stable with intermittent use of her steroid inhaler.  3. Age-related osteoporosis without current pathological fracture I recommend Cassandra Sims continue to receive her Prolia through Dr. Posey Pronto.  She should also continue her calcium and Vitamin D supplements.  4. Hyperlipidemia LDL goal <130 I agree with with not treating her LDL to tighter control at this point.   - Lipid panel; Future  5. Iron deficiency anemia, unspecified iron deficiency anemia type The chart indicates a prior history of anemia. I will assess iron and B12 levels and her CBC.  - Vitamin B12; Future - Iron, TIBC and Ferritin Panel; Future - CBC; Future  6. Vitamin D deficiency The chart indicates a prior history of Vitamin D deficiency. As she may have some nutritional issues, I will check her level.  - VITAMIN D 25 Hydroxy (Vit-D Deficiency, Fractures); Future  7. Hyponatremia The chart indicates some electrolyte issues. I will assess her current electrolytes.  - Comprehensive metabolic panel; Future  8. Weight loss Review of Cassandra Sims's chart shows an ~ 20 lb. weight loss over the past 4-5 years. This does not appear to be as sudden as she describes. I will check some screening labs. I anticipate that this is likely due to her advanced age.  - TSH; Future - Comprehensive metabolic panel; Future - CBC; Future  Haydee Salter, MD

## 2021-08-01 ENCOUNTER — Ambulatory Visit (INDEPENDENT_AMBULATORY_CARE_PROVIDER_SITE_OTHER): Payer: Medicare Other

## 2021-08-01 ENCOUNTER — Other Ambulatory Visit: Payer: Self-pay

## 2021-08-01 ENCOUNTER — Ambulatory Visit: Payer: Medicare Other

## 2021-08-01 DIAGNOSIS — L209 Atopic dermatitis, unspecified: Secondary | ICD-10-CM | POA: Diagnosis not present

## 2021-08-01 MED ORDER — DUPILUMAB 300 MG/2ML ~~LOC~~ SOSY
300.0000 mg | PREFILLED_SYRINGE | SUBCUTANEOUS | Status: DC
  Administered 2021-08-01 – 2021-10-11 (×6): 300 mg via SUBCUTANEOUS

## 2021-08-02 ENCOUNTER — Other Ambulatory Visit: Payer: Self-pay

## 2021-08-02 ENCOUNTER — Other Ambulatory Visit (INDEPENDENT_AMBULATORY_CARE_PROVIDER_SITE_OTHER): Payer: Medicare Other

## 2021-08-02 DIAGNOSIS — R634 Abnormal weight loss: Secondary | ICD-10-CM | POA: Diagnosis not present

## 2021-08-02 DIAGNOSIS — E871 Hypo-osmolality and hyponatremia: Secondary | ICD-10-CM

## 2021-08-02 DIAGNOSIS — E559 Vitamin D deficiency, unspecified: Secondary | ICD-10-CM

## 2021-08-02 DIAGNOSIS — E785 Hyperlipidemia, unspecified: Secondary | ICD-10-CM

## 2021-08-02 DIAGNOSIS — D509 Iron deficiency anemia, unspecified: Secondary | ICD-10-CM

## 2021-08-02 LAB — COMPREHENSIVE METABOLIC PANEL
ALT: 44 U/L — ABNORMAL HIGH (ref 0–35)
AST: 34 U/L (ref 0–37)
Albumin: 4.1 g/dL (ref 3.5–5.2)
Alkaline Phosphatase: 56 U/L (ref 39–117)
BUN: 20 mg/dL (ref 6–23)
CO2: 31 mEq/L (ref 19–32)
Calcium: 8.8 mg/dL (ref 8.4–10.5)
Chloride: 106 mEq/L (ref 96–112)
Creatinine, Ser: 0.73 mg/dL (ref 0.40–1.20)
GFR: 74.45 mL/min (ref >60.00)
Glucose, Bld: 88 mg/dL (ref 70–99)
Potassium: 3.8 mEq/L (ref 3.5–5.1)
Sodium: 143 mEq/L (ref 135–145)
Total Bilirubin: 0.6 mg/dL (ref 0.2–1.2)
Total Protein: 6 g/dL (ref 6.0–8.3)

## 2021-08-02 LAB — CBC
HCT: 38 % (ref 36.0–46.0)
Hemoglobin: 12.5 g/dL (ref 12.0–15.0)
MCHC: 33 g/dL (ref 30.0–36.0)
MCV: 92.8 fl (ref 78.0–100.0)
Platelets: 166 10*3/uL (ref 150.0–400.0)
RBC: 4.09 Mil/uL (ref 3.87–5.11)
RDW: 14 % (ref 11.5–15.5)
WBC: 3 10*3/uL — ABNORMAL LOW (ref 4.0–10.5)

## 2021-08-02 LAB — LIPID PANEL
Cholesterol: 191 mg/dL (ref 0–200)
HDL: 81.6 mg/dL (ref >39.00)
LDL Cholesterol: 102 mg/dL — ABNORMAL HIGH (ref 0–99)
NonHDL: 109.8
Total CHOL/HDL Ratio: 2
Triglycerides: 40 mg/dL (ref 0.0–149.0)
VLDL: 8 mg/dL (ref 0.0–40.0)

## 2021-08-02 LAB — TSH: TSH: 3.7 u[IU]/mL (ref 0.35–5.50)

## 2021-08-02 LAB — VITAMIN D 25 HYDROXY (VIT D DEFICIENCY, FRACTURES): VITD: 67.94 ng/mL (ref 30.00–100.00)

## 2021-08-02 LAB — VITAMIN B12: Vitamin B-12: 937 pg/mL — ABNORMAL HIGH (ref 211–911)

## 2021-08-03 LAB — IRON,TIBC AND FERRITIN PANEL
%SAT: 30 % (calc) (ref 16–45)
Ferritin: 24 ng/mL (ref 16–288)
Iron: 109 ug/dL (ref 45–160)
TIBC: 365 mcg/dL (calc) (ref 250–450)

## 2021-08-06 ENCOUNTER — Telehealth: Payer: Self-pay | Admitting: Family Medicine

## 2021-08-06 NOTE — Telephone Encounter (Signed)
Patient notified VIA phone that labs are  normal and no concerns.  Copy mailed to home address as requested.  Dm/cma

## 2021-08-10 ENCOUNTER — Telehealth: Payer: Self-pay | Admitting: Family Medicine

## 2021-08-10 NOTE — Telephone Encounter (Signed)
Pt wants a paper copy of her labs mailed to her.

## 2021-08-10 NOTE — Telephone Encounter (Signed)
Patient notified VIA phone that they were mailed out on the 16th.  Dm/cma

## 2021-08-14 ENCOUNTER — Other Ambulatory Visit: Payer: Self-pay | Admitting: *Deleted

## 2021-08-14 MED ORDER — DUPIXENT 300 MG/2ML ~~LOC~~ SOSY: 300.0000 mg | PREFILLED_SYRINGE | SUBCUTANEOUS | 11 refills | Status: DC

## 2021-08-15 ENCOUNTER — Ambulatory Visit: Payer: Medicare Other

## 2021-08-15 ENCOUNTER — Other Ambulatory Visit: Payer: Self-pay

## 2021-08-15 ENCOUNTER — Ambulatory Visit (INDEPENDENT_AMBULATORY_CARE_PROVIDER_SITE_OTHER): Payer: Medicare Other

## 2021-08-15 DIAGNOSIS — L209 Atopic dermatitis, unspecified: Secondary | ICD-10-CM | POA: Diagnosis not present

## 2021-08-29 ENCOUNTER — Ambulatory Visit (INDEPENDENT_AMBULATORY_CARE_PROVIDER_SITE_OTHER): Payer: Medicare Other

## 2021-08-29 ENCOUNTER — Other Ambulatory Visit: Payer: Self-pay

## 2021-08-29 DIAGNOSIS — L209 Atopic dermatitis, unspecified: Secondary | ICD-10-CM

## 2021-09-12 ENCOUNTER — Ambulatory Visit: Payer: Medicare Other

## 2021-09-13 ENCOUNTER — Ambulatory Visit (INDEPENDENT_AMBULATORY_CARE_PROVIDER_SITE_OTHER): Payer: Medicare Other

## 2021-09-13 ENCOUNTER — Other Ambulatory Visit: Payer: Self-pay

## 2021-09-13 DIAGNOSIS — L209 Atopic dermatitis, unspecified: Secondary | ICD-10-CM | POA: Diagnosis not present

## 2021-09-26 ENCOUNTER — Telehealth: Payer: Self-pay | Admitting: *Deleted

## 2021-09-26 ENCOUNTER — Ambulatory Visit: Payer: Medicare Other

## 2021-09-26 NOTE — Telephone Encounter (Signed)
Per [employer] that ships patient [place] patient wants [place] to call to order in the future her Rx to be shipped to [facility] office. Unfortunately I tried to reach patient to advise I am unable to order Dupixent for patients due to volume of patients I have ?

## 2021-09-27 ENCOUNTER — Ambulatory Visit (INDEPENDENT_AMBULATORY_CARE_PROVIDER_SITE_OTHER): Payer: Medicare Other

## 2021-09-27 ENCOUNTER — Other Ambulatory Visit: Payer: Self-pay

## 2021-09-27 DIAGNOSIS — L209 Atopic dermatitis, unspecified: Secondary | ICD-10-CM

## 2021-10-11 ENCOUNTER — Other Ambulatory Visit: Payer: Self-pay

## 2021-10-11 ENCOUNTER — Ambulatory Visit (INDEPENDENT_AMBULATORY_CARE_PROVIDER_SITE_OTHER): Payer: Medicare Other

## 2021-10-11 DIAGNOSIS — L209 Atopic dermatitis, unspecified: Secondary | ICD-10-CM

## 2021-10-24 ENCOUNTER — Ambulatory Visit: Payer: Medicare Other | Admitting: Family Medicine

## 2021-10-25 ENCOUNTER — Ambulatory Visit: Payer: Medicare Other

## 2021-11-22 ENCOUNTER — Telehealth: Payer: Self-pay

## 2021-11-22 NOTE — Telephone Encounter (Signed)
Patient has been having very bad pain in her bones and it freezes up to where she can't move her joints. She wants to know if this could be from the Dupixent injections. She states it comes and goes nothing steady. I told her I will call her back with recommendations, she states to leave a message if she doesn't answer.  ? [place] [phone] ?

## 2021-11-22 NOTE — Telephone Encounter (Signed)
Please call patient back. ? ?See if she can go to [facility] today for a follow up visit so we can see this rash. ? ?Is the joint pain worse with Dupixent?  ?Did she have any with Berna Bue? ? ?We can hold the injections and see how she's doing for a few months.  ? ?

## 2021-11-22 NOTE — Telephone Encounter (Signed)
Please call patient. ? ?Stop dupixent. ?Please have her see an eye doctor to get her eyes checked.  ?Dupixent sometimes can cause red, itchy eyes but we need to make sure there is nothing else going on. ? ?Regarding the joint pain - see if it improves after stopping the Dupixent. ?Can she take some ibuprofen or tylenol to hep with the pain meanwhile? ? ?

## 2021-11-22 NOTE — Telephone Encounter (Signed)
She is not worried about the rash just the joint pain that's bothering her she is sitting with a heating pad currently and not sure if this is arthritis or something. She could not sleep because of her shoulder pain. Her eyes are bothering her and she feels like she is going blind and she made an appt with eye dr for June.  ?

## 2021-11-23 NOTE — Telephone Encounter (Signed)
Spoke with patient, informed her of Dr. Julianne Rice recommendation. Patient verbalized understanding and will give [place] a call in two weeks with an update. ?

## 2021-11-26 ENCOUNTER — Ambulatory Visit: Payer: Medicare Other | Admitting: Family

## 2021-11-26 ENCOUNTER — Encounter: Payer: Self-pay | Admitting: Family

## 2021-11-26 VITALS — BP 158/80 | HR 59 | Temp 99.7°F | Resp 16

## 2021-11-26 DIAGNOSIS — H538 Other visual disturbances: Secondary | ICD-10-CM | POA: Diagnosis not present

## 2021-11-26 DIAGNOSIS — L299 Pruritus, unspecified: Secondary | ICD-10-CM | POA: Diagnosis not present

## 2021-11-26 DIAGNOSIS — M2550 Pain in unspecified joint: Secondary | ICD-10-CM | POA: Diagnosis not present

## 2021-11-26 DIAGNOSIS — J4550 Severe persistent asthma, uncomplicated: Secondary | ICD-10-CM | POA: Diagnosis not present

## 2021-11-26 NOTE — Telephone Encounter (Signed)
Please have patient schedule an appointment. ?Cassandra Sims has opening today at [place] at the [facility] office. Can she come in then? ?Does she have transportation? ? ? ?

## 2021-11-26 NOTE — Telephone Encounter (Signed)
Cassandra Sims called in and states she skipped her Dupixent injection on 5/4 and has noticed no improvement with her eyes.  Cassandra Sims states she has not been to see anyone about her eyes but she is trying to get seen by an eye doctor and can't get in anywhere.  Cassandra Sims wants to know if what should she do, or should she come in and be seen?  Please advise? ?

## 2021-11-26 NOTE — Patient Instructions (Addendum)
Severe persistent asthma ?Recommend scheduling an appointment with your eye doctor due to changes in your vision ? Stop Dupixent for now. We can discuss restarting after you have seen your eye doctor ?Use albuterol 2 puffs when you are having the coughing fits. ? ?Daily controller medication(s): Take Dulera 15mg 1 puff once a day on Monday/Wednesday/Friday with spacer and rinse mouth afterwards. ?During upper respiratory infections/asthma flares: start Dulera 1014m 2 puffs twice a day with spacer and rinse mouth afterwards for 1-2 weeks until your breathing symptoms return to baseline.  ?May use albuterol rescue inhaler 2 puffs every 4 to 6 hours as needed for shortness of breath, chest tightness, coughing, and wheezing. May use albuterol rescue inhaler 2 puffs 5 to 15 minutes prior to strenuous physical activities. Monitor frequency of use.  ?Asthma control goals:  ?Full participation in all desired activities (may need albuterol before activity) ?Albuterol use two times or less a week on average (not counting use with activity) ?Cough interfering with sleep two times or less a month ?Oral steroids no more than once a year ?No hospitalizations ? ?Rash/itching: ?Continue using Cerave lotion ?Continue proper skin care. ? ?Non allergic rhinitis (no use of steroid nasal sprays due to diagnosis of glaucoma) ?Use nasal saline lavage (i.e., NeilMed) 1-2 times per day. ?May use Afrin 1-2 times per week for severe nasal congestion.  ?Speak with your eye doctor about using Astepro nasal spray ? ?Heartburn  ?Continue dietary and lifestyle modifications. ?Continue with tums as needed.  ? ?Follow up in 4-6 weeks or sooner if needed.  ? ?Skin care recommendations ? ?Bath time: ?Always use lukewarm water. AVOID very hot or cold water. ?Keep bathing time to 5-10 minutes. ?Do NOT use bubble bath. ?Use a mild soap and use just enough to wash the dirty areas. ?Do NOT scrub skin vigorously.  ?After bathing, pat dry your skin with a  towel. Do NOT rub or scrub the skin. ? ?Moisturizers and prescriptions:  ?ALWAYS apply moisturizers immediately after bathing (within 3 minutes). This helps to lock-in moisture. ?Use the moisturizer several times a day over the whole body. ?Good summer moisturizers include: Aveeno, CeraVe, Cetaphil. ?Good winter moisturizers include: Aquaphor, Vaseline, Cerave, Cetaphil, Eucerin, Vanicream. ?When using moisturizers along with medications, the moisturizer should be applied about one hour after applying the medication to prevent diluting effect of the medication or moisturize around where you applied the medications. When not using medications, the moisturizer can be continued twice daily as maintenance. ? ?Laundry and clothing: ?Avoid laundry products with added color or perfumes. ?Use unscented hypo-allergenic laundry products such as Tide free, Cheer free & gentle, and All free and clear.  ?If the skin still seems dry or sensitive, you can try double-rinsing the clothes. ?Avoid tight or scratchy clothing such as wool. ?Do not use fabric softeners or dyer sheets. ?

## 2021-11-26 NOTE — Progress Notes (Signed)
? ?[place] [id number] ?Dept: [phone] ? ?FOLLOW UP NOTE ? ?Patient ID: Cassandra Sims, female    DOB: 06-04-1933  Age: 88 y.o. MRN: [id number] ?Date of Office Visit: 11/26/2021 ? ?Assessment  ?Chief Complaint: Follow-up (Help navigate the process of coming off dupixent. Almost three weeks ago since she had a last shot November 07, 2021. Symptoms have gotten worse. 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 Opted not to do surgery because of the risk factors. Patent's daughter states that she doesn't feel well since she has stopped the shots.), Other (Blood pressure has been high lately. Nurse monitors it everyday because of the concern of the aneurism.), and Asthma (Patient states that she does not want to do the lung testing today.) ? ?HPI ?Cassandra Sims is an 88 year old female who presents today for an acute visit of discussing Dupixent.  She was last seen on June 11, 2021 by Dr. Maudie Mercury.  Her daughter is here with her today and helps provide history.  She reports that she was diagnosed with a basal aneurysm since her last office visit and has opted out of surgery due to risks. ? ?Severe persistent asthma is reported as controlled with Dulera 100 mcg 1 puff once a day on Monday/Wednesday/Friday and albuterol as needed.  She reports occasional cough and denies wheezing, tightness in her chest, shortness of breath, and nocturnal awakenings due to breathing problems.  Since her last office visit she has not required any systemic steroids or made any trips to the emergency room or urgent care due to breathing problems.  She has not had to use her albuterol hardly at all.  Her daughter reports that she stopped taking Dupixent approximately 2-1/2 weeks ago and she feels worse.  She feels like her joint pain is worse.  She reports that she had joint pain prior to starting Dupixent injections, but they are worse now.  She has seen 2 different  orthopedic physicians and has tried braces for both arms and creams.  She is also tried Aleve and Naprosyn.  Her daughter reports that they did not see any arthritis when they did x-rays and was told it was more vascular.  She also feels like with her vision has changed. Everything is blurry. She has the diagnosis of glaucoma and has tried to make an appointment with her eye doctor,but he is out of town this week.  She has noticed this change in her vision in the past couple weeks. She reports feeling terrible since going off the Dupixent injections.  Her blood pressure is elevated for which they feel this is due to the pain she is in. Instructed her to speak with her primary care physician about her increased blood pressure. She has an appointment with her primary care physician this Thursday. ? ?Pruritis is reported as moderately controlled with Cerave lotion. She reports that her eye doctor does not want her on antihistamines or steroid creams. Her daughter wants to know if she possibly could restart her Dupixent injections if her appointment with her eye doctor is ok. She does feel like Dupixent has helped with her pruritis. Her daughter reports that her primary care physician did not think that Lexapro was the cause of her itching. ? ?Nonallergic rhinitis is reported as moderately controlled with saline rinse as needed and over the counter Astepro that her primary care physician put her on. She reports nasal congestion and post nasal drip  at times. She denies rhinorrhea and has not had any sinus infections since we last saw her. ? ?She has seen her primary care physician concerning her unsteady gait and has done physical therapy.  ? ? ?Drug Allergies:  ?Allergies  ?Allergen Reactions  ? Penicillins Shortness Of Breath and Swelling  ?  Has patient had a PCN reaction causing immediate rash, facial/tongue/throat swelling, SOB or lightheadedness with hypotension: Yes ?Has patient had a PCN reaction causing severe  rash involving mucus membranes or skin necrosis: No ?Has patient had a PCN reaction that required hospitalization: No ?Has patient had a PCN reaction occurring within the last 10 years: No ?If all of the above answers are "NO", then may proceed with Cephalosporin use. ?  ? Zantac [Ranitidine] Itching  ? Apraclonidine Other (See Comments)  ?  Very light sensitive, swollen eye  ? Brinzolamide Other (See Comments)  ? Doxycycline Other (See Comments)  ?  Pt states she tolerates OKAY  ? Lactose Other (See Comments)  ? Acetaminophen   ? Adhesive [Tape] Itching  ?  BAND-AID TAPE-skin redness/itching  ? Clindamycin/Lincomycin Other (See Comments)  ?  Severe joint pain.  ? Codeine Nausea And Vomiting  ? Darvocet [Propoxyphene N-Acetaminophen] Other (See Comments)  ?  HTN  ? Dilaudid [Hydromorphone Hcl] Nausea And Vomiting  ?  DROP BLOOD PRESSURE ?HYPOTENSIVE  ? Flagyl [Metronidazole] Other (See Comments)  ?  Joint pains  ? Lactose Intolerance (Gi)   ? Levofloxacin   ?  Joint pain  ? Macrodantin [Nitrofurantoin Macrocrystal]   ? Morphine And Related   ?  hallucinate  ? Other   ? Pedi-Pre Tape Spray [Wound Dressing Adhesive]   ? Premarin [Conjugated Estrogens] Itching  ? Tramadol Hcl Other (See Comments)  ?  Causes insomnia  ? Vancomycin Hives and Itching  ? Zofran [Ondansetron Hcl]   ?  HTN  ? Clarithromycin   ?  Other reaction(s): Abdominal Pain ?Anxious/nervous feeling  ? Denosumab Rash  ? Sulfa Antibiotics Rash  ? ? ?Review of Systems: ?Review of Systems  ?Constitutional:  Positive for chills. Negative for fever.  ?HENT:    ?     Reports nasal congestion and some postnasal drip.  Denies rhinorrhea  ?Eyes:   ?     Reports blurred vision that is worsened over the past couple weeks.  Denies itchy watery eyes  ?Respiratory:  Positive for cough. Negative for shortness of breath and wheezing.   ?     Reports occasional cough and denies wheezing, tightness in chest, shortness of breath, and nocturnal awakenings due to breathing  problem  ?Cardiovascular:  Negative for chest pain and palpitations.  ?Gastrointestinal:   ?     Denies heartburn or reflux symptoms.  Reports that she burps sometimes for which she will take Tums  ?Genitourinary:  Negative for frequency.  ?Skin:  Positive for itching.  ?     Reports itching but no rash  ?Neurological:  Positive for headaches.  ?Endo/Heme/Allergies:  Negative for environmental allergies.  ? ? ?Physical Exam: ?BP (!) 158/80   Pulse (!) 59   Temp 99.7 ?F (37.6 ?C) (Temporal)   Resp 16   SpO2 96%   ? ?Physical Exam ?Constitutional:   ?   Appearance: Normal appearance.  ?HENT:  ?   Head: Normocephalic and atraumatic.  ?   Comments: Pharynx normal, eyes normal, ears normal, nose normal ?   Right Ear: Tympanic membrane, ear canal and external ear normal.  ?  Left Ear: Tympanic membrane, ear canal and external ear normal.  ?   Nose: Nose normal.  ?   Mouth/Throat:  ?   Mouth: Mucous membranes are moist.  ?   Pharynx: Oropharynx is clear.  ?Eyes:  ?   Conjunctiva/sclera: Conjunctivae normal.  ?Cardiovascular:  ?   Rate and Rhythm: Regular rhythm. Bradycardia present.  ?   Heart sounds: Normal heart sounds.  ?Pulmonary:  ?   Effort: Pulmonary effort is normal.  ?   Breath sounds: Normal breath sounds.  ?   Comments: Lungs clear to auscultation ?Musculoskeletal:  ?   Cervical back: Neck supple.  ?Skin: ?   General: Skin is warm.  ?   Comments: No rash or urticarial lesions noted  ?Neurological:  ?   Mental Status: She is alert and oriented to person, place, and time.  ?Psychiatric:     ?   Mood and Affect: Mood normal.     ?   Behavior: Behavior normal.     ?   Thought Content: Thought content normal.     ?   Judgment: Judgment normal.  ? ? ?Diagnostics: ?None ? ?Assessment and Plan: ?1. Blurry vision   ?2. Arthralgia, unspecified joint   ?3. Severe persistent asthma without complication   ?4. Pruritus   ? ? ?No orders of the defined types were placed in this encounter. ? ? ?Patient Instructions  ?Severe  persistent asthma ?Recommend scheduling an appointment with your eye doctor due to changes in your vision ? Stop Dupixent for now. We can discuss restarting after you have seen your eye doctor ?Use albuterol 2 pu

## 2022-01-17 ENCOUNTER — Other Ambulatory Visit: Payer: Self-pay | Admitting: Allergy

## 2022-03-11 ENCOUNTER — Telehealth: Payer: Self-pay | Admitting: *Deleted

## 2022-03-11 MED ORDER — AZELASTINE HCL 0.1 % NA SOLN: 1.0000 | Freq: Two times a day (BID) | NASAL | 3 refills | Status: DC | PRN

## 2022-03-11 NOTE — Telephone Encounter (Signed)
Please call patient.  Let's try a nasal spray first that way it will cause less of the drying side effects.  All antihistamines will make her dry eyes worse.   Use azelastine nasal spray 1 sprays per nostril twice a day as needed for runny nose/drainage.

## 2022-03-11 NOTE — Telephone Encounter (Signed)
Can you please call patient and clarify which doctor took her off her allergy medications and which ones was she taking?  Can she make a follow up visit? Last visit was in May.  Thank you.

## 2022-03-11 NOTE — Telephone Encounter (Signed)
Patient called and stated that the doctors took her off of all of her allergy medicine and she now is asking to go back on something because she is tired of sneezing and blowing her nose.

## 2022-03-11 NOTE — Telephone Encounter (Signed)
Called patient - DOB verified - stated her Optometrist took her off Claritin due to it drying her eyes out. Patient stated she wanted to know if it was okay if she went back on it to help with the sneezing and runny nose - or is there an alternative antihistamine she can take???  Patient stated she will have to call back to schedule a follow up appt - she has to make sure she has transportation.  Forwarding message to provider as update.

## 2022-03-11 NOTE — Addendum Note (Signed)
Addended by: Garnet Sierras on: 03/11/2022 05:20 PM   Modules accepted: Orders

## 2022-03-11 NOTE — Telephone Encounter (Signed)
Called patient - DOB verified - advised of provider notation below.  Patient verbalized understanding, no further questions. 

## 2022-03-27 ENCOUNTER — Encounter (HOSPITAL_BASED_OUTPATIENT_CLINIC_OR_DEPARTMENT_OTHER): Payer: Self-pay | Admitting: Urology

## 2022-03-27 ENCOUNTER — Encounter (HOSPITAL_COMMUNITY): Payer: Self-pay

## 2022-03-27 ENCOUNTER — Emergency Department (HOSPITAL_BASED_OUTPATIENT_CLINIC_OR_DEPARTMENT_OTHER): Payer: Medicare Other

## 2022-03-27 ENCOUNTER — Inpatient Hospital Stay (HOSPITAL_BASED_OUTPATIENT_CLINIC_OR_DEPARTMENT_OTHER)
Admission: EM | Admit: 2022-03-27 | Discharge: 2022-03-30 | DRG: 065 | Disposition: A | Discharge status: Home / Self Care | Payer: Medicare Other | Attending: Internal Medicine | Admitting: Internal Medicine

## 2022-03-27 DIAGNOSIS — H409 Unspecified glaucoma: Secondary | ICD-10-CM | POA: Diagnosis present

## 2022-03-27 DIAGNOSIS — Z881 Allergy status to other antibiotic agents status: Secondary | ICD-10-CM

## 2022-03-27 DIAGNOSIS — Z880 Allergy status to penicillin: Secondary | ICD-10-CM

## 2022-03-27 DIAGNOSIS — Z8261 Family history of arthritis: Secondary | ICD-10-CM

## 2022-03-27 DIAGNOSIS — Z9049 Acquired absence of other specified parts of digestive tract: Secondary | ICD-10-CM

## 2022-03-27 DIAGNOSIS — I160 Hypertensive urgency: Secondary | ICD-10-CM

## 2022-03-27 DIAGNOSIS — Z888 Allergy status to other drugs, medicaments and biological substances status: Secondary | ICD-10-CM

## 2022-03-27 DIAGNOSIS — H538 Other visual disturbances: Secondary | ICD-10-CM | POA: Diagnosis not present

## 2022-03-27 DIAGNOSIS — Z806 Family history of leukemia: Secondary | ICD-10-CM

## 2022-03-27 DIAGNOSIS — Z96659 Presence of unspecified artificial knee joint: Secondary | ICD-10-CM | POA: Diagnosis present

## 2022-03-27 DIAGNOSIS — Z66 Do not resuscitate: Secondary | ICD-10-CM | POA: Diagnosis present

## 2022-03-27 DIAGNOSIS — I161 Hypertensive emergency: Secondary | ICD-10-CM | POA: Diagnosis present

## 2022-03-27 DIAGNOSIS — Z79899 Other long term (current) drug therapy: Secondary | ICD-10-CM

## 2022-03-27 DIAGNOSIS — K219 Gastro-esophageal reflux disease without esophagitis: Secondary | ICD-10-CM | POA: Diagnosis present

## 2022-03-27 DIAGNOSIS — I10 Essential (primary) hypertension: Secondary | ICD-10-CM | POA: Diagnosis present

## 2022-03-27 DIAGNOSIS — I609 Nontraumatic subarachnoid hemorrhage, unspecified: Secondary | ICD-10-CM | POA: Diagnosis not present

## 2022-03-27 DIAGNOSIS — I604 Nontraumatic subarachnoid hemorrhage from basilar artery: Secondary | ICD-10-CM | POA: Diagnosis not present

## 2022-03-27 DIAGNOSIS — F419 Anxiety disorder, unspecified: Secondary | ICD-10-CM | POA: Diagnosis present

## 2022-03-27 DIAGNOSIS — Z8249 Family history of ischemic heart disease and other diseases of the circulatory system: Secondary | ICD-10-CM

## 2022-03-27 DIAGNOSIS — Z803 Family history of malignant neoplasm of breast: Secondary | ICD-10-CM

## 2022-03-27 DIAGNOSIS — Z882 Allergy status to sulfonamides status: Secondary | ICD-10-CM

## 2022-03-27 DIAGNOSIS — Z90710 Acquired absence of both cervix and uterus: Secondary | ICD-10-CM

## 2022-03-27 DIAGNOSIS — J45909 Unspecified asthma, uncomplicated: Secondary | ICD-10-CM | POA: Diagnosis present

## 2022-03-27 DIAGNOSIS — I725 Aneurysm of other precerebral arteries: Secondary | ICD-10-CM | POA: Diagnosis present

## 2022-03-27 LAB — CBC WITH DIFFERENTIAL/PLATELET
Abs Immature Granulocytes: 0.01 10*3/uL (ref 0.00–0.07)
Basophils Absolute: 0.1 10*3/uL (ref 0.0–0.1)
Basophils Relative: 1 %
Eosinophils Absolute: 0.8 10*3/uL — ABNORMAL HIGH (ref 0.0–0.5)
Eosinophils Relative: 15 %
HCT: 40.2 % (ref 36.0–46.0)
Hemoglobin: 13.5 g/dL (ref 12.0–15.0)
Immature Granulocytes: 0 %
Lymphocytes Relative: 24 %
Lymphs Abs: 1.3 10*3/uL (ref 0.7–4.0)
MCH: 31 pg (ref 26.0–34.0)
MCHC: 33.6 g/dL (ref 30.0–36.0)
MCV: 92.4 fL (ref 80.0–100.0)
Monocytes Absolute: 0.8 10*3/uL (ref 0.1–1.0)
Monocytes Relative: 14 %
Neutro Abs: 2.4 10*3/uL (ref 1.7–7.7)
Neutrophils Relative %: 46 %
Platelets: 191 10*3/uL (ref 150–400)
RBC: 4.35 MIL/uL (ref 3.87–5.11)
RDW: 14.3 % (ref 11.5–15.5)
WBC: 5.3 10*3/uL (ref 4.0–10.5)
nRBC: 0 % (ref 0.0–0.2)

## 2022-03-27 LAB — COMPREHENSIVE METABOLIC PANEL
ALT: 32 U/L (ref 0–44)
AST: 31 U/L (ref 15–41)
Albumin: 4.1 g/dL (ref 3.5–5.0)
Alkaline Phosphatase: 65 U/L (ref 38–126)
Anion gap: 6 (ref 5–15)
BUN: 22 mg/dL (ref 8–23)
CO2: 26 mmol/L (ref 22–32)
Calcium: 9 mg/dL (ref 8.9–10.3)
Chloride: 106 mmol/L (ref 98–111)
Creatinine, Ser: 0.77 mg/dL (ref 0.44–1.00)
GFR, Estimated: >60 mL/min (ref >60)
Glucose, Bld: 100 mg/dL — ABNORMAL HIGH (ref 70–99)
Potassium: 3.9 mmol/L (ref 3.5–5.1)
Sodium: 138 mmol/L (ref 135–145)
Total Bilirubin: 0.6 mg/dL (ref 0.3–1.2)
Total Protein: 7.1 g/dL (ref 6.5–8.1)

## 2022-03-27 LAB — PROTIME-INR
INR: 1.1 (ref 0.8–1.2)
Prothrombin Time: 14 seconds (ref 11.4–15.2)

## 2022-03-27 MED ORDER — ACETAMINOPHEN 500 MG PO TABS
1000.0000 mg | ORAL_TABLET | Freq: Once | ORAL | Status: AC
  Administered 2022-03-27: 1000 mg via ORAL
  Filled 2022-03-27: qty 2

## 2022-03-27 MED ORDER — NICARDIPINE HCL IN NACL 20-0.86 MG/200ML-% IV SOLN
0.0000 mg/h | INTRAVENOUS | Status: DC
  Administered 2022-03-27: 5 mg/h via INTRAVENOUS
  Administered 2022-03-28: 2.5 mg/h via INTRAVENOUS
  Filled 2022-03-27 (×3): qty 200

## 2022-03-27 NOTE — ED Triage Notes (Signed)
High blood pressure 180/100 today, states occipital HA Pt currently on palliative care, sent by palliative care nurse h/o Brain aneuysm in 2019 that has doubled in size

## 2022-03-27 NOTE — ED Notes (Signed)
EDP aware of pt BP 

## 2022-03-27 NOTE — ED Provider Notes (Signed)
[facility] EMERGENCY DEPARTMENT Provider Note   CSN: [id number] Arrival date & time: 03/27/22  1729     History Chief Complaint  Patient presents with   Hypertension    HPI Cassandra Sims is a 88 y.o. female presenting for headache and hypertension.  She was waiting on the lobby when I received a call the patient had a trace subarachnoid hemorrhage in the setting of a basilar aneurysm.  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   Patient's recorded medical, surgical, social, medication list and allergies were reviewed in the Snapshot window as part of the initial history.   Review of Systems   Review of Systems  Constitutional:  Negative for chills and fever.  HENT:  Negative for ear pain and sore throat.   Eyes:  Negative for pain and visual disturbance.  Respiratory:  Negative for cough and shortness of breath.   Cardiovascular:  Negative for chest pain and palpitations.  Gastrointestinal:  Negative for abdominal pain and vomiting.  Genitourinary:  Negative for dysuria and hematuria.  Musculoskeletal:  Negative for arthralgias and back pain.  Skin:  Negative for color change and rash.  Neurological:  Positive for numbness and headaches. Negative for seizures and syncope.  All other systems reviewed and are negative.   Physical Exam Updated Vital Signs BP 116/68   Pulse 63   Temp 97.6 F (36.4 C)   Resp 19   Ht '5\' 3"'$  (1.6 m)   Wt 46.4 kg   SpO2 91%   BMI 18.12 kg/m  Physical Exam Vitals and nursing note reviewed.  Constitutional:      General: She is not in acute distress.    Appearance: She is well-developed.  HENT:     Head: Normocephalic and atraumatic.  Eyes:     Conjunctiva/sclera: Conjunctivae normal.  Cardiovascular:     Rate and Rhythm: Normal rate and regular rhythm.     Heart sounds: No murmur heard. Pulmonary:     Effort: Pulmonary effort is normal. No respiratory distress.     Breath  sounds: Normal breath sounds.  Abdominal:     General: There is no distension.     Palpations: Abdomen is soft.     Tenderness: There is no abdominal tenderness. There is no right CVA tenderness or left CVA tenderness.  Musculoskeletal:        General: No swelling or tenderness. Normal range of motion.     Cervical back: Neck supple.  Skin:    General: Skin is warm and dry.  Neurological:     General: No focal deficit present.     Mental Status: She is alert and oriented to person, place, and time. Mental status is at baseline.     Cranial Nerves: No cranial nerve deficit.      ED Course/ Medical Decision Making/ A&P Clinical Course as of 03/28/22 0036  Wed Mar 27, 2022  1815 Received call from patien'ts OP provider Trace SAH Basilar Aneurysm  [CC]  1947 D [CC]    Clinical Course User Index [CC] Tretha Sciara, MD    Procedures .Critical Care  Performed by: Tretha Sciara, MD Authorized by: Tretha Sciara, MD   Critical care provider statement:    Critical care time (minutes):  30   Critical care was necessary to treat or prevent imminent or life-threatening deterioration of the following conditions:  CNS failure or compromise   Critical care was time spent personally by me on  the following activities:  Development of treatment plan with patient or surrogate, discussions with consultants, evaluation of patient's response to treatment, examination of patient, ordering and review of laboratory studies, ordering and review of radiographic studies, ordering and performing treatments and interventions, pulse oximetry, re-evaluation of patient's condition and review of old charts    Medications Ordered in ED Medications  nicardipine (CARDENE) '20mg'$  in 0.86% saline 244m IV infusion (0.1 mg/ml) (2.5 mg/hr Intravenous Rate/Dose Change 03/27/22 1923)  acetaminophen (TYLENOL) tablet 1,000 mg (1,000 mg Oral Given 03/27/22 1944)    Medical Decision Making:    Cassandra Sims is a 88y.o. female who presented to the ED today with headache detailed above.     Patient's presentation is complicated by their history of multiple comorbid medical conditions.  Patient placed on continuous vitals and telemetry monitoring while in ED which was reviewed periodically.   Complete initial physical exam performed, notably the patient  was hemodynamically stable in no acute distress.      Reviewed and confirmed nursing documentation for past medical history, family history, social history.    Initial Assessment:   Patient was emergently called back from triage because of a triage CT scan demonstrating a acute subarachnoid hemorrhage.  I emergently started patient on nicardipine drip to control her acute hypertensive emergency.  I called neurosurgery, neurology, critical care medicine to jointly coordinate patient's care.  Patient's nicardipine was closely titrated to systolic less than 1818  After multiple phone calls between the subspecialty providers, critical care agreed on admission at main campus and patient was arranged for emergent transportation.  She had no focal neurologic deficits or changes during this care and remained GCS 15.  Navigated these conditions in the setting of patient's palliative care status closely discussing ever decision with patient's family.  Clinical Impression:  1. [place]      Admit   Final Clinical Impression(s) / ED Diagnoses Final diagnoses:  [place]    Rx / DC Orders ED Discharge Orders     None         CTretha Sciara MD 03/28/22 [id number]

## 2022-03-28 DIAGNOSIS — Z803 Family history of malignant neoplasm of breast: Secondary | ICD-10-CM | POA: Diagnosis not present

## 2022-03-28 DIAGNOSIS — Z888 Allergy status to other drugs, medicaments and biological substances status: Secondary | ICD-10-CM | POA: Diagnosis not present

## 2022-03-28 DIAGNOSIS — I160 Hypertensive urgency: Secondary | ICD-10-CM | POA: Diagnosis not present

## 2022-03-28 DIAGNOSIS — F419 Anxiety disorder, unspecified: Secondary | ICD-10-CM | POA: Diagnosis present

## 2022-03-28 DIAGNOSIS — Z90710 Acquired absence of both cervix and uterus: Secondary | ICD-10-CM | POA: Diagnosis not present

## 2022-03-28 DIAGNOSIS — I725 Aneurysm of other precerebral arteries: Secondary | ICD-10-CM | POA: Diagnosis present

## 2022-03-28 DIAGNOSIS — H409 Unspecified glaucoma: Secondary | ICD-10-CM | POA: Diagnosis present

## 2022-03-28 DIAGNOSIS — Z806 Family history of leukemia: Secondary | ICD-10-CM | POA: Diagnosis not present

## 2022-03-28 DIAGNOSIS — I609 Nontraumatic subarachnoid hemorrhage, unspecified: Secondary | ICD-10-CM | POA: Diagnosis present

## 2022-03-28 DIAGNOSIS — Z66 Do not resuscitate: Secondary | ICD-10-CM | POA: Diagnosis present

## 2022-03-28 DIAGNOSIS — I161 Hypertensive emergency: Secondary | ICD-10-CM | POA: Diagnosis present

## 2022-03-28 DIAGNOSIS — K219 Gastro-esophageal reflux disease without esophagitis: Secondary | ICD-10-CM | POA: Diagnosis present

## 2022-03-28 DIAGNOSIS — Z8249 Family history of ischemic heart disease and other diseases of the circulatory system: Secondary | ICD-10-CM | POA: Diagnosis not present

## 2022-03-28 DIAGNOSIS — Z881 Allergy status to other antibiotic agents status: Secondary | ICD-10-CM | POA: Diagnosis not present

## 2022-03-28 DIAGNOSIS — I604 Nontraumatic subarachnoid hemorrhage from basilar artery: Secondary | ICD-10-CM | POA: Diagnosis present

## 2022-03-28 DIAGNOSIS — Z79899 Other long term (current) drug therapy: Secondary | ICD-10-CM | POA: Diagnosis not present

## 2022-03-28 DIAGNOSIS — J45909 Unspecified asthma, uncomplicated: Secondary | ICD-10-CM | POA: Diagnosis present

## 2022-03-28 DIAGNOSIS — H538 Other visual disturbances: Secondary | ICD-10-CM | POA: Diagnosis not present

## 2022-03-28 DIAGNOSIS — Z96659 Presence of unspecified artificial knee joint: Secondary | ICD-10-CM | POA: Diagnosis present

## 2022-03-28 DIAGNOSIS — Z9049 Acquired absence of other specified parts of digestive tract: Secondary | ICD-10-CM | POA: Diagnosis not present

## 2022-03-28 DIAGNOSIS — I10 Essential (primary) hypertension: Secondary | ICD-10-CM | POA: Diagnosis present

## 2022-03-28 DIAGNOSIS — Z8261 Family history of arthritis: Secondary | ICD-10-CM | POA: Diagnosis not present

## 2022-03-28 DIAGNOSIS — R519 Headache, unspecified: Secondary | ICD-10-CM | POA: Diagnosis not present

## 2022-03-28 DIAGNOSIS — Z880 Allergy status to penicillin: Secondary | ICD-10-CM | POA: Diagnosis not present

## 2022-03-28 DIAGNOSIS — Z882 Allergy status to sulfonamides status: Secondary | ICD-10-CM | POA: Diagnosis not present

## 2022-03-28 DIAGNOSIS — Z515 Encounter for palliative care: Secondary | ICD-10-CM | POA: Diagnosis not present

## 2022-03-28 LAB — CBC
HCT: 39.8 % (ref 36.0–46.0)
Hemoglobin: 13.2 g/dL (ref 12.0–15.0)
MCH: 30.8 pg (ref 26.0–34.0)
MCHC: 33.2 g/dL (ref 30.0–36.0)
MCV: 92.8 fL (ref 80.0–100.0)
Platelets: 182 10*3/uL (ref 150–400)
RBC: 4.29 MIL/uL (ref 3.87–5.11)
RDW: 14.3 % (ref 11.5–15.5)
WBC: 5 10*3/uL (ref 4.0–10.5)
nRBC: 0 % (ref 0.0–0.2)

## 2022-03-28 LAB — MRSA NEXT GEN BY PCR, NASAL: MRSA by PCR Next Gen: NOT DETECTED

## 2022-03-28 LAB — BASIC METABOLIC PANEL
Anion gap: 5 (ref 5–15)
BUN: 12 mg/dL (ref 8–23)
CO2: 23 mmol/L (ref 22–32)
Calcium: 8 mg/dL — ABNORMAL LOW (ref 8.9–10.3)
Chloride: 108 mmol/L (ref 98–111)
Creatinine, Ser: 0.61 mg/dL (ref 0.44–1.00)
GFR, Estimated: >60 mL/min (ref >60)
Glucose, Bld: 188 mg/dL — ABNORMAL HIGH (ref 70–99)
Potassium: 3.3 mmol/L — ABNORMAL LOW (ref 3.5–5.1)
Sodium: 136 mmol/L (ref 135–145)

## 2022-03-28 LAB — PHOSPHORUS: Phosphorus: 2.3 mg/dL — ABNORMAL LOW (ref 2.5–4.6)

## 2022-03-28 LAB — MAGNESIUM: Magnesium: 2.1 mg/dL (ref 1.7–2.4)

## 2022-03-28 MED ORDER — ORAL CARE MOUTH RINSE: 15.0000 mL | OROMUCOSAL | Status: DC | PRN

## 2022-03-28 MED ORDER — AMLODIPINE BESYLATE 2.5 MG PO TABS
2.5000 mg | ORAL_TABLET | Freq: Every day | ORAL | Status: DC
  Administered 2022-03-28 – 2022-03-30 (×3): 2.5 mg via ORAL
  Filled 2022-03-28 (×3): qty 1

## 2022-03-28 MED ORDER — MOMETASONE FURO-FORMOTEROL FUM 100-5 MCG/ACT IN AERO
2.0000 | INHALATION_SPRAY | Freq: Two times a day (BID) | RESPIRATORY_TRACT | Status: DC
  Administered 2022-03-28 – 2022-03-30 (×5): 2 via RESPIRATORY_TRACT
  Filled 2022-03-28: qty 8.8

## 2022-03-28 MED ORDER — TRAMADOL HCL 50 MG PO TABS
25.0000 mg | ORAL_TABLET | Freq: Four times a day (QID) | ORAL | Status: DC | PRN
  Administered 2022-03-28: 25 mg via ORAL
  Filled 2022-03-28: qty 1

## 2022-03-28 MED ORDER — LABETALOL HCL 100 MG PO TABS
100.0000 mg | ORAL_TABLET | Freq: Two times a day (BID) | ORAL | Status: DC
  Administered 2022-03-28 – 2022-03-30 (×5): 100 mg via ORAL
  Filled 2022-03-28 (×5): qty 1

## 2022-03-28 MED ORDER — LATANOPROST 0.005 % OP SOLN
1.0000 [drp] | Freq: Every day | OPHTHALMIC | Status: DC
  Administered 2022-03-28 – 2022-03-29 (×2): 1 [drp] via OPHTHALMIC
  Filled 2022-03-28: qty 2.5

## 2022-03-28 MED ORDER — ACETAMINOPHEN 325 MG PO TABS
650.0000 mg | ORAL_TABLET | Freq: Four times a day (QID) | ORAL | Status: DC | PRN
  Administered 2022-03-28 – 2022-03-29 (×3): 650 mg via ORAL
  Filled 2022-03-28 (×3): qty 2

## 2022-03-28 MED ORDER — ALBUTEROL SULFATE (2.5 MG/3ML) 0.083% IN NEBU: 2.5000 mg | INHALATION_SOLUTION | RESPIRATORY_TRACT | Status: DC | PRN

## 2022-03-28 MED ORDER — POLYETHYLENE GLYCOL 3350 17 G PO PACK: 17.0000 g | PACK | Freq: Every day | ORAL | Status: DC | PRN

## 2022-03-28 MED ORDER — DORZOLAMIDE HCL 2 % OP SOLN
1.0000 [drp] | Freq: Three times a day (TID) | OPHTHALMIC | Status: DC
  Administered 2022-03-28 – 2022-03-30 (×7): 1 [drp] via OPHTHALMIC
  Filled 2022-03-28: qty 10

## 2022-03-28 MED ORDER — IRBESARTAN 300 MG PO TABS
300.0000 mg | ORAL_TABLET | Freq: Every day | ORAL | Status: DC
  Administered 2022-03-28 – 2022-03-30 (×3): 300 mg via ORAL
  Filled 2022-03-28 (×3): qty 1

## 2022-03-28 MED ORDER — ESCITALOPRAM OXALATE 10 MG PO TABS
5.0000 mg | ORAL_TABLET | Freq: Every day | ORAL | Status: DC
  Administered 2022-03-30: 5 mg via ORAL
  Filled 2022-03-28 (×2): qty 1

## 2022-03-28 MED ORDER — KATE FARMS STANDARD 1.4 PO LIQD
325.0000 mL | Freq: Two times a day (BID) | ORAL | Status: DC
  Administered 2022-03-28 – 2022-03-30 (×2): 325 mL via ORAL
  Filled 2022-03-28 (×5): qty 325

## 2022-03-28 MED ORDER — HYDRALAZINE HCL 50 MG PO TABS: 25.0000 mg | ORAL_TABLET | Freq: Three times a day (TID) | ORAL | Status: DC

## 2022-03-28 MED ORDER — CYCLOBENZAPRINE HCL 10 MG PO TABS
5.0000 mg | ORAL_TABLET | Freq: Three times a day (TID) | ORAL | Status: DC | PRN
  Administered 2022-03-28 – 2022-03-29 (×2): 5 mg via ORAL
  Filled 2022-03-28 (×3): qty 1

## 2022-03-28 MED ORDER — CHLORHEXIDINE GLUCONATE CLOTH 2 % EX PADS
6.0000 | MEDICATED_PAD | Freq: Every day | CUTANEOUS | Status: DC
  Administered 2022-03-28 – 2022-03-30 (×4): 6 via TOPICAL

## 2022-03-28 MED ORDER — AZELASTINE HCL 0.1 % NA SOLN: 1.0000 | Freq: Two times a day (BID) | NASAL | Status: DC | PRN

## 2022-03-28 MED ORDER — ESCITALOPRAM OXALATE 5 MG PO TABS
2.5000 mg | ORAL_TABLET | Freq: Every day | ORAL | Status: DC
  Filled 2022-03-28 (×2): qty 1

## 2022-03-28 MED ORDER — DOCUSATE SODIUM 100 MG PO CAPS: 100.0000 mg | ORAL_CAPSULE | Freq: Two times a day (BID) | ORAL | Status: DC | PRN

## 2022-03-28 NOTE — Plan of Care (Signed)

## 2022-03-28 NOTE — Progress Notes (Signed)
eLink Physician-Brief Progress Note Patient Name: Cassandra Sims DOB: 02/28/1933 MRN: [id number]   Date of Service  03/28/2022  HPI/Events of Note  Patient transferred from [facility] emergency department to [facility] for further treatment of a sub-arachnoid hemorrhage occurring in the the context of a known inoperable basilar aneurysm for which the patient is currently on palliative care status.  eICU Interventions  New Patient Evaluation. Strict BP control with a Cardene gtt, Neurosurgical consultation.        [name] 03/28/2022, 2:31 AM

## 2022-03-28 NOTE — ED Notes (Signed)
Pt transferred to [facility] via [facility] 

## 2022-03-28 NOTE — Progress Notes (Addendum)
NAME:  Cassandra Sims, MRN:  [id number], DOB:  16-Mar-1933, LOS: 0 ADMISSION DATE:  03/27/2022, CONSULTATION DATE:  9/7 REFERRING MD:  EDP, CHIEF COMPLAINT:  SAH   History of Present Illness:  88 year old woman who presented to [facility] 9/6 with headache and hypertension.  PMHx significant for HTN, prolonged QT, asthma, GERD, anxiety, persistent occipital headache in the setting of known basilar artery aneurysm, PVD, glaucoma.    Initially complained of headache on admission; CT Head demonstrated trace subarachnoid hemorrhage in the left central sulcus and large basilar tip aneurysm, increase in size since 2021.  Nicardipine drip was started in the setting of significant hypertension.  Patient was subsequently transferred to [facility] for further management.   PCCM consulted for admission  Pertinent  Medical History  Asthma, GERD, HTN, PVD, pre-diabetes, prolonged QT  Significant Hospital Events: Including procedures, antibiotic start and stop dates in addition to other pertinent events   9/6 presented to ED with headache + HTN, found to have acute SAH; PCCM consulted for admission  Interim History / Subjective:  Patient evaluated at bedside this morning. She is endorsing a headache that has not responded to Tylenol. She did not sleep much last night.   Objective   Blood pressure 116/75, pulse 63, temperature 98.3 F (36.8 C), temperature source Oral, resp. rate 14, height 5' 2.5" (1.588 m), weight 46.6 kg, SpO2 94 %.        Intake/Output Summary (Last 24 hours) at 03/28/2022 0643 Last data filed at 03/28/2022 0600 Gross per 24 hour  Intake 400.85 ml  Output 550 ml  Net -149.15 ml   Filed Weights   03/27/22 1740 03/28/22 0206  Weight: 46.4 kg 46.6 kg    Examination: General: Well appearing elderly female laying in bed, NAD HENT: [place]/AT, eyes anicteric, PERRL Lungs: CTAB, normal work of breathing Cardiovascular: RRR, no murmurs Abdomen: Soft, nontender, nondistended, +BS Extremities: No  LE edema Neuro: A&Ox4, moves all extremities, follows all commands   Resolved Hospital Problem list     Assessment & Plan:  Trace subarachnoid hemorrhage Known basilar artery anerusym Presented to [place] with posterior headache, hypertension.  CT Head demonstrated trace subarachnoid hemorrhage in the left central sulcus and large basilar tip aneurysm, increase in size since 2021. Neurosurgery consulted from ED and states the patient is not a surgical candidate. She follows with [facility] neurosurg.  - Admit to ICU for blood pressure control - Basilar artery aneurysm nontreatable, patient remains on palliative care for this - Symptomatic treatment as needed for headache - Tylenol, Flexeril as needed - Tramadol 25 mg prn for severe pain - Consult palliative care   Hypertension - Nicardipine gtt; wean off as able - Goal SBP < 160 - Cardiac monitoring - Resume home amlodipine/labetalol/ARB this morning   Asthma - Supplemental O2 as needed for SPO2 greater than 90% - Continue home Dulera, albuterol as needed   Glaucoma - Resume home dorzolamide tid  - Rocklatan not on formulary - will start latanoprost drops daily   Best Practice (right click and "Reselect all SmartList Selections" daily)   Diet/type: Regular consistency (see orders) DVT prophylaxis: not indicated GI prophylaxis: N/A Lines: N/A Foley:  N/A Code Status:  DNR Last date of multidisciplinary goals of care discussion [pt and daughter updated at bedside 9/7]  Dispo: Transfer out of ICU once off nicardipine gtt  Labs   CBC: Recent Labs  Lab 03/27/22 1843  WBC 5.3  NEUTROABS 2.4  HGB 13.5  HCT 40.2  MCV 92.4  PLT 976    Basic Metabolic Panel: Recent Labs  Lab 03/27/22 1843  NA 138  K 3.9  CL 106  CO2 26  GLUCOSE 100*  BUN 22  CREATININE 0.77  CALCIUM 9.0   GFR: Estimated Creatinine Clearance: 36.4 mL/min (by C-G formula based on SCr of 0.77 mg/dL). Recent Labs  Lab 03/27/22 1843  WBC  5.3    Liver Function Tests: Recent Labs  Lab 03/27/22 1843  AST 31  ALT 32  ALKPHOS 65  BILITOT 0.6  PROT 7.1  ALBUMIN 4.1   No results for input(s): "LIPASE", "AMYLASE" in the last 168 hours. No results for input(s): "AMMONIA" in the last 168 hours.  ABG No results found for: "PHART", "PCO2ART", "PO2ART", "HCO3", "TCO2", "ACIDBASEDEF", "O2SAT"   Coagulation Profile: Recent Labs  Lab 03/27/22 1843  INR 1.1    Cardiac Enzymes: No results for input(s): "CKTOTAL", "CKMB", "CKMBINDEX", "TROPONINI" in the last 168 hours.  HbA1C: Hgb A1c MFr Bld  Date/Time Value Ref Range Status  03/20/2018 08:53 AM 5.6 4.8 - 5.6 % Final    Comment:    (NOTE) Pre diabetes:          5.7%-6.4% Diabetes:              >6.4% Glycemic control for   <7.0% adults with diabetes   12/29/2017 02:07 PM 5.9 (H) 4.8 - 5.6 % Final    Comment:    (NOTE) Pre diabetes:          5.7%-6.4% Diabetes:              >6.4% Glycemic control for   <7.0% adults with diabetes     CBG: No results for input(s): "GLUCAP" in the last 168 hours.   Past Medical History:  She,  has a past medical history of Anxiety, Arthritis, Asthma, Complication of anesthesia, Family history of adverse reaction to anesthesia, GERD (gastroesophageal reflux disease), Glaucoma, Headache, HOH (hard of hearing), Hypertension, Hypoglycemia, Multiple allergies, Peripheral vascular disease ([place]), PONV (postoperative nausea and vomiting), Pre-diabetes, Prolonged QT interval (01/13/2018), Rhinitis, S/P knee replacement (01/13/2018), and Wears glasses.   Surgical History:   Past Surgical History:  Procedure Laterality Date   ABDOMINAL HYSTERECTOMY     APPENDECTOMY     BREAST SURGERY     biopsy left    COLECTOMY  2008   obstruction   DIAGNOSTIC LAPAROSCOPY     exp   DILATION AND CURETTAGE OF UTERUS     EYE SURGERY     cataract   bil and retina surgery right eye, glaucoma surgery, right eye   FUNCTIONAL ENDOSCOPIC SINUS SURGERY   2009   bilat ethm,frontal,max   KNEE ARTHROSCOPY Right 03/30/2018   Procedure: right knee scope; synovectomy;  Surgeon: Gaynelle Arabian, MD;  Location: [facility];  Service: Orthopedics;  Laterality: Right;  76mn   NASAL SINUS SURGERY Bilateral 10/26/2012   Procedure: BILATERAL ENDOSCOPIC REVISION ETHMOID MAXILLARY AND FRONTAL SINUS SURGERY  ;  Surgeon: JIzora Gala MD;  [place]  Service: ENT;  Laterality: Bilateral;   OVARIAN CYST SURGERY     TOTAL KNEE ARTHROPLASTY Right 01/05/2018   Procedure: RIGHT TOTAL KNEE ARTHROPLASTY;  Surgeon: AGaynelle Arabian MD;  Location: [facility];  Service: Orthopedics;  Laterality: Right;   TUBAL LIGATION     URETER SURGERY     cut accidentally during exp lap-ovarien cyst   VEIN LIGATION       Social History:   reports that  she has never smoked. She has never used smokeless tobacco. She reports that she does not drink alcohol and does not use drugs.   Family History:  Her family history includes Arthritis in her mother; Breast cancer in her daughter; Diabetes Mellitus I in an other family member; Glaucoma in her mother; Leukemia in her brother; Lung disease in her sister. There is no history of Allergic rhinitis, Angioedema, Asthma, Atopy, Eczema, Urticaria, or Immunodeficiency.   Allergies Allergies  Allergen Reactions   Penicillins Shortness Of Breath and Swelling    Has patient had a PCN reaction causing immediate rash, facial/tongue/throat swelling, SOB or lightheadedness with hypotension: Yes Has patient had a PCN reaction causing severe rash involving mucus membranes or skin necrosis: No Has patient had a PCN reaction that required hospitalization: No Has patient had a PCN reaction occurring within the last 10 years: No If all of the above answers are "NO", then may proceed with Cephalosporin use.    Zantac [Ranitidine] Itching   Apraclonidine Other (See Comments)    Very light sensitive, swollen eye   Brinzolamide Other  (See Comments)   Doxycycline Other (See Comments)    Pt states she tolerates OKAY   Lactose Other (See Comments)   Adhesive [Tape] Itching    BAND-AID TAPE-skin redness/itching   Clindamycin/Lincomycin Other (See Comments)    Severe joint pain.   Codeine Nausea And Vomiting   Darvocet [Propoxyphene N-Acetaminophen] Other (See Comments)    HTN   Dilaudid [Hydromorphone Hcl] Nausea And Vomiting    DROP BLOOD PRESSURE HYPOTENSIVE   Flagyl [Metronidazole] Other (See Comments)    Joint pains   Lactose Intolerance (Gi)    Levofloxacin     Joint pain   Macrodantin [Nitrofurantoin Macrocrystal]    Morphine And Related     hallucinate   Other    Pedi-Pre Tape Spray [Wound Dressing Adhesive]    Premarin [Conjugated Estrogens] Itching   Tramadol Hcl Other (See Comments)    Causes insomnia   Vancomycin Hives and Itching   Zofran [Ondansetron Hcl]     HTN   Clarithromycin     Other reaction(s): Abdominal Pain Anxious/nervous feeling   Denosumab Rash   Sulfa Antibiotics Rash     Home Medications  Prior to Admission medications   Medication Sig Start Date End Date Taking? Authorizing Provider  acetaminophen (TYLENOL) 500 MG tablet Take 1,000 mg by mouth every 8 (eight) hours as needed for mild pain.    [provider]  albuterol (VENTOLIN HFA) 108 (90 Base) MCG/ACT inhaler Inhale 2 puffs into the lungs every 4 (four) hours as needed for wheezing. Reported on 10/10/2015 02/01/21   Garnet Sierras, DO  amLODipine (NORVASC) 2.5 MG tablet Take by mouth. 07/13/20   [provider]  azelastine (ASTELIN) 0.1 % nasal spray Place 1 spray into both nostrils 2 (two) times daily as needed (nasal drainage). Use in each nostril as directed 03/11/22   Garnet Sierras, DO  bimatoprost (LUMIGAN) 0.03 % ophthalmic solution 1 drop at bedtime.    [provider]  Calcium Carbonate Antacid (TUMS PO) Take by mouth. Take 2 tums twice daily.    [provider]  carboxymethylcellulose  (REFRESH PLUS) 0.5 % SOLN Place 1 drop into both eyes 5 (five) times daily as needed (for dry eyes).    [provider]  Cholecalciferol 75 MCG (3000 UT) TABS Take 1 tablet by mouth daily. Patient not taking: Reported on 11/26/2021    [provider]  denosumab (PROLIA) 60 MG/ML SOSY injection Inject into the skin. 09/13/20 09/03/22  [provider]  DULERA 100-5 MCG/ACT AERO INHALE 1 PUFF INTO THE LUNGS DAILY. WITH SPACER AND RINSE MOUTH AFTERWARDS. 01/17/22   Garnet Sierras, DO  dupilumab (DUPIXENT) 300 MG/2ML prefilled syringe Inject 300 mg into the skin every 14 (fourteen) days. Patient not taking: Reported on 11/26/2021 08/14/21   Garnet Sierras, DO  escitalopram (LEXAPRO) 5 MG tablet Take 2.5 mg by mouth daily. 10/17/20   [provider]  hydrOXYzine (ATARAX) 10 MG tablet TAKE 1 TABLET (10 MG TOTAL) BY MOUTH AT BEDTIME AS NEEDED FOR ITCHING. Patient not taking: Reported on 11/26/2021 07/02/21   Garnet Sierras, DO  labetalol (NORMODYNE) 100 MG tablet Take 1 tablet by mouth 2 (two) times daily. 01/12/20   [provider]  Lactobacillus (ACIDOPHILUS PO) Take 1 capsule by mouth 3 (three) times a week.  Patient not taking: Reported on 11/26/2021    [provider]  LUMIGAN 0.01 % SOLN 1 drop at bedtime. 11/28/20   [provider]  LUTEIN PO Take 20 mg by mouth daily.    [provider]  Multiple Vitamin (MULTIVITAMIN WITH MINERALS) TABS tablet Take 1 tablet by mouth daily. Nature Made Multi Vitamin for Her 50+ W/No Iron    [provider]  olmesartan (BENICAR) 40 MG tablet Take 1 tablet by mouth daily. 04/25/20   [provider]  psyllium (METAMUCIL) 58.6 % powder Take 1 packet by mouth daily. 14.3 g (1 heaping tablespoon) every morning.    [provider]  triamcinolone cream (KENALOG) 0.1 % Apply to affected skin of body twice daily as needed for itching. NEVER to face/neck/groin. Patient not taking: Reported on 11/26/2021  01/23/21   [provider]  triamcinolone ointment (KENALOG) 0.1 % Apply 1 application topically 2 (two) times daily. Moisturize twice a day with this. Patient not taking: Reported on 11/26/2021 01/31/21   Garnet Sierras, DO  Turmeric 450 MG CAPS Take 450 mg by mouth daily with lunch.     [provider]     Critical care time: 25 mins

## 2022-03-28 NOTE — Progress Notes (Addendum)
I have reviewed the patient's history in the EMR and her current CT and previous CTA. She is on home palliative care, DNR/DNI with known large distal basilar aneurysm followed by Dr. Harvel Ricks at [facility]. They have elected not to undergo treatment. Her current CT does demonstrate trace convexity SAH which I do not believe is related to the aneurysm. I would not recommend any intervention or further imaging for her aneurysm as the risks of any potential treatment likely outweigh the risk of morbidity/mortality with natural history. Would recommend d/c back home to Palliative care when medically stable to do so.  Consuella Lose, MD [facility]

## 2022-03-28 NOTE — Progress Notes (Signed)
   This pt is active with [facility]. This is a home based Palliative care program provided by [facility]. She is receiving routine nursing visit and a SW visit.  She is eligible to return  home with our services and can receive additional therapy services if needed at d/c.   Thank you for assisting in the care with this pt. Webb Silversmith RN BSN [facility] [phone]

## 2022-03-28 NOTE — Progress Notes (Signed)
eLink Physician-Brief Progress Note Patient Name: Cassandra Sims DOB: [birth year] MRN: [id number]   Date of Service  03/28/2022  HPI/Events of Note  Patient and daughter are in the room, along with Denton Ar RN, intent of the discussion is to clarify code and intubation status.  eICU Interventions  Patient and her daughter affirm that she is to be DNR / DNI, they have a signed yellow form reiterating this status which they made available in the course of the conversation. DNR / DNI order entered.        Kerry Kass [facility] 03/28/2022, 4:39 AM

## 2022-03-28 NOTE — H&P (Signed)
NAME:  Cassandra Sims, MRN:  [id number], DOB:  12-04-1932, LOS: 0 ADMISSION DATE:  03/27/2022 CONSULTATION DATE:  03/28/2022 REFERRING MD:  Oswald Hillock - EDP [facility]  CHIEF COMPLAINT:  SAH   History of Present Illness:  88 year old woman who presented to [facility] 9/6 with headache and hypertension.  PMHx significant for HTN, prolonged QT, asthma, GERD, anxiety, persistent occipital headache in the setting of known basilar artery aneurysm, PVD, glaucoma.   Initially complained of headache on admission; CT Head demonstrated trace subarachnoid hemorrhage in the left central sulcus and large basilar tip aneurysm, increase in size since 2021.  Nicardipine drip was started in the setting of significant hypertension.  Patient was subsequently transferred to [facility] for further management.  PCCM consulted for admission.  Pertinent Medical History:   Past Medical History:  Diagnosis Date   Anxiety    Arthritis    Asthma    Complication of anesthesia    hard to wake up, 2009  sit up in bed to fast and BP dropped low code blue called per pt.   Family history of adverse reaction to anesthesia    sister has naseau   GERD (gastroesophageal reflux disease)    Glaucoma    Headache    hx of migrains   HOH (hard of hearing)    Hypertension    Hypoglycemia    Multiple allergies    Peripheral vascular disease (HCC)    varicose veins , Laser vein surgery   PONV (postoperative nausea and vomiting)    Pre-diabetes    Prolonged QT interval 01/13/2018   Rhinitis    S/P knee replacement 01/13/2018   Wears glasses    Significant Hospital Events: Including procedures, antibiotic start and stop dates in addition to other pertinent events   9/6 - Presented to [facility] for headache, hypertension.  CT Head with trace subarachnoid hemorrhage, increased size of known basilar aneurysm.  Nicardipine drip started for hypertension.  Transferred to [facility] for further care.  Interim History / Subjective:  PCCM consulted for  admission Patient arrived from [facility] and conversant, complaining of posterior/occipital headache, no other complaints at present  Objective:  Blood pressure 128/71, pulse 63, temperature 98.1 F (36.7 C), temperature source Oral, resp. rate (!) 21, height 5' 2.5" (1.588 m), weight 46.6 kg, SpO2 94 %.       No intake or output data in the 24 hours ending 03/28/22 0219 Filed Weights   03/27/22 1740 03/28/22 0206  Weight: 46.4 kg 46.6 kg   Physical Examination: General: Overall well-appearing elderly woman in NAD. Pleasant and conversant. HEENT: [place]/AT, anicteric sclera, PERRL, moist mucous membranes. HOH. Neuro: Awake, oriented x 4. Responds to verbal stimuli. Following commands consistently. Moves all 4 extremities spontaneously.  CV: RRR, no m/g/r. PULM: Breathing even and unlabored on RA. Lung fields CTAB. GI: Soft, nontender, nondistended. Normoactive bowel sounds. Extremities: No LE edema noted. Skin: Warm/dry, no rashes.  Resolved Hospital Problem List:    Assessment & Plan:  Trace subarachnoid hemorrhage Known basilar artery anerusym Presented to [facility] with posterior headache, hypertension.  CT Head demonstrated trace subarachnoid hemorrhage in the left central sulcus and large basilar tip aneurysm, increase in size since 2021. - Admit to ICU for blood pressure control - Basilar artery aneurysm nontreatable, patient remains on palliative care for this - Symptomatic treatment as needed for headache - Tylenol, Flexeril as needed  Hypertension - Nicardipine gtt - Goal SBP < 160 - Cardiac monitoring -  Resume home amlodipine/labetalol/ARB - Plan to wean off of nicardipine today, 9/7  Asthma - Supplemental O2 as needed for SPO2 greater than 90% - Continue home Dulera, albuterol as needed  Glaucoma - Resume home glaucoma medications as available - Daughter to locate new prescriptions   Best Practice: (right click and "Reselect all  SmartList Selections" daily)   Diet/type: Regular consistency (see orders) DVT prophylaxis: SCDs, no AC in the setting of SAH GI prophylaxis: N/A Lines: N/A Foley:  N/A Code Status:  DNR Last date of multidisciplinary goals of care discussion [Pending]  Labs:  CBC: Recent Labs  Lab 03/27/22 1843  WBC 5.3  NEUTROABS 2.4  HGB 13.5  HCT 40.2  MCV 92.4  PLT 191   Basic Metabolic Panel: Recent Labs  Lab 03/27/22 1843  NA 138  K 3.9  CL 106  CO2 26  GLUCOSE 100*  BUN 22  CREATININE 0.77  CALCIUM 9.0   GFR: Estimated Creatinine Clearance: 36.4 mL/min (by C-G formula based on SCr of 0.77 mg/dL). Recent Labs  Lab 03/27/22 1843  WBC 5.3   Liver Function Tests: Recent Labs  Lab 03/27/22 1843  AST 31  ALT 32  ALKPHOS 65  BILITOT 0.6  PROT 7.1  ALBUMIN 4.1   No results for input(s): "LIPASE", "AMYLASE" in the last 168 hours. No results for input(s): "AMMONIA" in the last 168 hours.  ABG: No results found for: "PHART", "PCO2ART", "PO2ART", "HCO3", "TCO2", "ACIDBASEDEF", "O2SAT"   Coagulation Profile: Recent Labs  Lab 03/27/22 1843  INR 1.1   Cardiac Enzymes: No results for input(s): "CKTOTAL", "CKMB", "CKMBINDEX", "TROPONINI" in the last 168 hours.  HbA1C: Hgb A1c MFr Bld  Date/Time Value Ref Range Status  03/20/2018 08:53 AM 5.6 4.8 - 5.6 % Final    Comment:    (NOTE) Pre diabetes:          5.7%-6.4% Diabetes:              >6.4% Glycemic control for   <7.0% adults with diabetes   12/29/2017 02:07 PM 5.9 (H) 4.8 - 5.6 % Final    Comment:    (NOTE) Pre diabetes:          5.7%-6.4% Diabetes:              >6.4% Glycemic control for   <7.0% adults with diabetes    CBG: No results for input(s): "GLUCAP" in the last 168 hours.  Review of Systems:   Review of systems completed with pertinent positives/negatives outlined in above HPI.  Past Medical History:  She,  has a past medical history of Anxiety, Arthritis, Asthma, Complication of  anesthesia, Family history of adverse reaction to anesthesia, GERD (gastroesophageal reflux disease), Glaucoma, Headache, HOH (hard of hearing), Hypertension, Hypoglycemia, Multiple allergies, Peripheral vascular disease ([place]), PONV (postoperative nausea and vomiting), Pre-diabetes, Prolonged QT interval (01/13/2018), Rhinitis, S/P knee replacement (01/13/2018), and Wears glasses.   Surgical History:   Past Surgical History:  Procedure Laterality Date   ABDOMINAL HYSTERECTOMY     APPENDECTOMY     BREAST SURGERY     biopsy left    COLECTOMY  2008   obstruction   DIAGNOSTIC LAPAROSCOPY     exp   DILATION AND CURETTAGE OF UTERUS     EYE SURGERY     cataract   bil and retina surgery right eye, glaucoma surgery, right eye   FUNCTIONAL ENDOSCOPIC SINUS SURGERY  2009   bilat ethm,frontal,max   KNEE ARTHROSCOPY Right 03/30/2018   Procedure:  right knee scope; synovectomy;  Surgeon: Gaynelle Arabian, MD;  Location: [facility];  Service: Orthopedics;  Laterality: Right;  70mn   NASAL SINUS SURGERY Bilateral 10/26/2012   Procedure: BILATERAL ENDOSCOPIC REVISION ETHMOID MAXILLARY AND FRONTAL SINUS SURGERY  ;  Surgeon: JIzora Gala MD;  Location[place] [facility]  Service: ENT;  Laterality: Bilateral;   OVARIAN CYST SURGERY     TOTAL KNEE ARTHROPLASTY Right 01/05/2018   Procedure: RIGHT TOTAL KNEE ARTHROPLASTY;  Surgeon: AGaynelle Arabian MD;  Location: [facility];  Service: Orthopedics;  Laterality: Right;   TUBAL LIGATION     URETER SURGERY     cut accidentally during exp lap-ovarien cyst   VEIN LIGATION     Social History:   reports that she has never smoked. She has never used smokeless tobacco. She reports that she does not drink alcohol and does not use drugs.   Family History:  Her family history includes Arthritis in her mother; Breast cancer in her daughter; Diabetes Mellitus I in an other family member; Glaucoma in her mother; Leukemia in her brother; Lung disease in her sister. There is  no history of Allergic rhinitis, Angioedema, Asthma, Atopy, Eczema, Urticaria, or Immunodeficiency.   Allergies: Allergies  Allergen Reactions   Penicillins Shortness Of Breath and Swelling    Has patient had a PCN reaction causing immediate rash, facial/tongue/throat swelling, SOB or lightheadedness with hypotension: Yes Has patient had a PCN reaction causing severe rash involving mucus membranes or skin necrosis: No Has patient had a PCN reaction that required hospitalization: No Has patient had a PCN reaction occurring within the last 10 years: No If all of the above answers are "NO", then may proceed with Cephalosporin use.    Zantac [Ranitidine] Itching   Apraclonidine Other (See Comments)    Very light sensitive, swollen eye   Brinzolamide Other (See Comments)   Doxycycline Other (See Comments)    Pt states she tolerates OKAY   Lactose Other (See Comments)   Acetaminophen    Adhesive [Tape] Itching    BAND-AID TAPE-skin redness/itching   Clindamycin/Lincomycin Other (See Comments)    Severe joint pain.   Codeine Nausea And Vomiting   Darvocet [Propoxyphene N-Acetaminophen] Other (See Comments)    HTN   Dilaudid [Hydromorphone Hcl] Nausea And Vomiting    DROP BLOOD PRESSURE HYPOTENSIVE   Flagyl [Metronidazole] Other (See Comments)    Joint pains   Lactose Intolerance (Gi)    Levofloxacin     Joint pain   Macrodantin [Nitrofurantoin Macrocrystal]    Morphine And Related     hallucinate   Other    Pedi-Pre Tape Spray [Wound Dressing Adhesive]    Premarin [Conjugated Estrogens] Itching   Tramadol Hcl Other (See Comments)    Causes insomnia   Vancomycin Hives and Itching   Zofran [Ondansetron Hcl]     HTN   Clarithromycin     Other reaction(s): Abdominal Pain Anxious/nervous feeling   Denosumab Rash   Sulfa Antibiotics Rash   Home Medications: Prior to Admission medications   Medication Sig Start Date End Date Taking? Authorizing Provider  acetaminophen  (TYLENOL) 500 MG tablet Take 1,000 mg by mouth every 8 (eight) hours as needed for mild pain.    [provider]  albuterol (VENTOLIN HFA) 108 (90 Base) MCG/ACT inhaler Inhale 2 puffs into the lungs every 4 (four) hours as needed for wheezing. Reported on 10/10/2015 02/01/21   KGarnet Sierras DO  amLODipine (NORVASC) 2.5 MG tablet Take by mouth.  07/13/20   [provider]  azelastine (ASTELIN) 0.1 % nasal spray Place 1 spray into both nostrils 2 (two) times daily as needed (nasal drainage). Use in each nostril as directed 03/11/22   Garnet Sierras, DO  bimatoprost (LUMIGAN) 0.03 % ophthalmic solution 1 drop at bedtime.    [provider]  Calcium Carbonate Antacid (TUMS PO) Take by mouth. Take 2 tums twice daily.    [provider]  carboxymethylcellulose (REFRESH PLUS) 0.5 % SOLN Place 1 drop into both eyes 5 (five) times daily as needed (for dry eyes).    [provider]  Cholecalciferol 75 MCG (3000 UT) TABS Take 1 tablet by mouth daily. Patient not taking: Reported on 11/26/2021    [provider]  denosumab (PROLIA) 60 MG/ML SOSY injection Inject into the skin. 09/13/20 09/03/22  [provider]  DULERA 100-5 MCG/ACT AERO INHALE 1 PUFF INTO THE LUNGS DAILY. WITH SPACER AND RINSE MOUTH AFTERWARDS. 01/17/22   Garnet Sierras, DO  dupilumab (DUPIXENT) 300 MG/2ML prefilled syringe Inject 300 mg into the skin every 14 (fourteen) days. Patient not taking: Reported on 11/26/2021 08/14/21   Garnet Sierras, DO  escitalopram (LEXAPRO) 5 MG tablet Take 2.5 mg by mouth daily. 10/17/20   [provider]  hydrOXYzine (ATARAX) 10 MG tablet TAKE 1 TABLET (10 MG TOTAL) BY MOUTH AT BEDTIME AS NEEDED FOR ITCHING. Patient not taking: Reported on 11/26/2021 07/02/21   Garnet Sierras, DO  labetalol (NORMODYNE) 100 MG tablet Take 1 tablet by mouth 2 (two) times daily. 01/12/20   [provider]  Lactobacillus (ACIDOPHILUS PO) Take 1 capsule by mouth 3 (three) times a  week.  Patient not taking: Reported on 11/26/2021    [provider]  LUMIGAN 0.01 % SOLN 1 drop at bedtime. 11/28/20   [provider]  LUTEIN PO Take 20 mg by mouth daily.    [provider]  Multiple Vitamin (MULTIVITAMIN WITH MINERALS) TABS tablet Take 1 tablet by mouth daily. Nature Made Multi Vitamin for Her 50+ W/No Iron    [provider]  olmesartan (BENICAR) 40 MG tablet Take 1 tablet by mouth daily. 04/25/20   [provider]  psyllium (METAMUCIL) 58.6 % powder Take 1 packet by mouth daily. 14.3 g (1 heaping tablespoon) every morning.    [provider]  triamcinolone cream (KENALOG) 0.1 % Apply to affected skin of body twice daily as needed for itching. NEVER to face/neck/groin. Patient not taking: Reported on 11/26/2021 01/23/21   [provider]  triamcinolone ointment (KENALOG) 0.1 % Apply 1 application topically 2 (two) times daily. Moisturize twice a day with this. Patient not taking: Reported on 11/26/2021 01/31/21   Garnet Sierras, DO  Turmeric 450 MG CAPS Take 450 mg by mouth daily with lunch.     [provider]    Critical care time: 44 minutes   Lestine Mount, PA-C [facility] 03/28/22 2:19 AM  Please see [email or web address] for pager details.  From 7A-7P if no response, please call [phone] After hours, please call ELink [phone]

## 2022-03-29 DIAGNOSIS — Z515 Encounter for palliative care: Secondary | ICD-10-CM | POA: Diagnosis not present

## 2022-03-29 DIAGNOSIS — I609 Nontraumatic subarachnoid hemorrhage, unspecified: Secondary | ICD-10-CM | POA: Diagnosis not present

## 2022-03-29 DIAGNOSIS — R519 Headache, unspecified: Secondary | ICD-10-CM | POA: Diagnosis not present

## 2022-03-29 DIAGNOSIS — I160 Hypertensive urgency: Secondary | ICD-10-CM | POA: Diagnosis not present

## 2022-03-29 MED ORDER — PSYLLIUM 95 % PO PACK
1.0000 | PACK | Freq: Every day | ORAL | Status: DC
  Administered 2022-03-29 – 2022-03-30 (×2): 1 via ORAL
  Filled 2022-03-29 (×2): qty 1

## 2022-03-29 NOTE — Hospital Course (Addendum)
88 year old female with past medical history of hypertension, asthma, GERD, anxiety, history of basilar artery aneurysm with persistent headache, peripheral vascular disease, glaucoma presented to the hospital with headache.  CT head demonstrated trace subarachnoid hemorrhage in the left central sulcus and large basilar tip aneurysm increased from 2021.  Nicardipine was started and was admitted to the ICU.  Patient was seen by neurosurgery and critical care.  Nicardipine drip was subsequently weaned off and patient was started on oral antihypertensive.  Subsequently was transferred out of ICU.

## 2022-03-29 NOTE — TOC Initial Note (Signed)
Transition of Care [facility]) - Initial/Assessment Note    Patient Details  Name: Cassandra Sims MRN: [id number] Date of Birth: Dec 04, 1932  Transition of Care [facility]) CM/SW Contact:    Pollie Friar, RN Phone Number: 03/29/2022, 4:17 PM  Clinical Narrative:                 Pt is from home alone but has family that checks in on her. She is active with [facility] for palliative care.  Pt's daughters provide needed transportation.  Pt manages her own medications and denies any issues.  Per daughter pt has just received  new rollator at home. No f/u per PT/OT. Pt has transportation home when medically ready.  Expected Discharge Plan: Home/Self Care Barriers to Discharge: Continued Medical Work up   Patient Goals and CMS Choice        Expected Discharge Plan and Services Expected Discharge Plan: Home/Self Care       Living arrangements for the past 2 months: Single Family Home                                      Prior Living Arrangements/Services Living arrangements for the past 2 months: Single Family Home Lives with:: Self Patient language and need for interpreter reviewed:: Yes Do you feel safe going back to the place where you live?: Yes          Current home services: DME (shower seat/ 3 in 1/ walker) Criminal Activity/Legal Involvement Pertinent to Current Situation/Hospitalization: No - Comment as needed  Activities of Daily Living      Permission Sought/Granted                  Emotional Assessment Appearance:: Appears stated age Attitude/Demeanor/Rapport: Engaged Affect (typically observed): Accepting Orientation: : Oriented to Self, Oriented to Place, Oriented to  Time, Oriented to Situation   Psych Involvement: No (comment)  Admission diagnosis:  SAH (subarachnoid hemorrhage) ([place]) [I60.9] Patient Active Problem List   Diagnosis Date Noted   Hypertensive urgency    SAH (subarachnoid hemorrhage) ([place]) 03/27/2022   Pruritus  06/11/2021   Notalgia paresthetica 04/10/2021   Unsteady gait 03/28/2021   Moderate persistent asthma 09/27/2020   Nonallergic rhinitis 09/27/2020   Hammer toes of both feet 09/27/2020   Coronary artery calcification seen on CT scan 03/21/2020   Basilar artery aneurysm (HCC) 06/14/2019   Tinnitus of left ear 06/11/2019   Presbycusis of both ears 06/11/2019   Rotator cuff arthropathy of left shoulder 01/27/2019   Anxiety 01/27/2019   Psychophysiological insomnia 12/24/2018   TMJ pain dysfunction syndrome 08/13/2018   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 03/30/2018   Chronic fatigue 02/04/2018   Prolonged QT interval 01/13/2018   S/P knee replacement 01/13/2018   OA (osteoarthritis) of knee 01/05/2018   Osteoporosis 09/19/2017   GERD (gastroesophageal reflux disease) 07/03/2016   Hemidiaphragm paralysis 07/03/2016   Nodule of right lung 06/19/2016   Prediabetes 11/29/2015   OAB (overactive bladder) 06/23/2015   Status post intraocular lens implant 03/24/2014   Essential hypertension 12/23/2013   Hyperlipidemia LDL goal <130 12/23/2013   Pseudophakia 10/29/2013   Primary open angle glaucoma 04/26/2013   Sinusitis, chronic 11/25/2012   Nuclear senile cataract 02/26/2012   Puckering of macula, right eye 06/10/2011   PCP:  [facility]:   [id number] [place], [place]  [place] [address] Phone: [phone] Fax: [phone]  [place], [place] [address] [place] [address] Phone: [phone] Fax: [place], [place] [address] Phone: [phone] Fax: [phone]  [id number] - [place], [place] - [address] [place] AT [place] [address] [place]  [address] Phone: [phone] Fax: [phone]  [place], [place] - [address] [place] [address] Phone: [phone] Fax: [phone]  [place], [place] [address] Phone: [phone] Fax: [phone]  [place], [place] [address] [place] [address] Phone: [phone] Fax: [phone]     Social Determinants of Health (SDOH) Interventions    Readmission Risk Interventions     No data to display

## 2022-03-29 NOTE — Progress Notes (Signed)
PROGRESS NOTE    Cassandra Sims  OEH:[id number] DOB: [birth year] DOA: 03/27/2022 PCP: Jinny Blossom, [facility]    Brief Narrative:  88 year old female with past medical history of hypertension, asthma, GERD, anxiety, history of basilar artery aneurysm with persistent headache, peripheral vascular disease, glaucoma presented to the hospital with headache.  CT head demonstrated trace subarachnoid hemorrhage in the left central sulcus and large basilar tip aneurysm increased from 2021.  Nicardipine was started and was admitted to the ICU.  Patient was seen by neurosurgery and critical care.  Nicardipine drip was subsequently weaned off and patient was started on oral antihypertensive.  Subsequently was transferred out of ICU.  Assessment and plan.  Principal Problem:   SAH (subarachnoid hemorrhage) (HCC) Active Problems:   Hypertensive urgency   Trace subarachnoid hemorrhage with known basilar artery anerusym Increased size of basilar artery aneurysm.  Neurosurgery was consulted and patient is not a surgical candidate.  Patient was admitted to ICU for blood pressure control.  Patient is on palliative care as outpatient.  Continue symptomatic treatment including tramadol for headache.  Flexeril.  Palliative care has been consulted. antihypertensives have been resumed.  Palliative care has followed the patient at this time and MOST form was introduced.  Plan is to continue to follow-up with palliative care on discharge.  Essential hypertension Was on the Cardene drip which has been weaned off.  Currently on labetalol and verapamil.  Blood pressure controlled at this time.   Asthma Continue home Dulera, albuterol as needed.  On room air.  Compensated   Glaucoma Continue dorzolamide and latanoprost drops.      DVT prophylaxis: SCDs Start: 03/28/22 0214   Code Status:     Code Status: DNR  Disposition:  Likely home on 03/30/2022 if remains stable.  Patient has declined physical  therapy services at home.  Status is: Inpatient  Remains inpatient appropriate because: Intracranial hemorrhage,  Family Communication:  I spoke with the patient's daughter Ms. Darius Bump on the phone and updated her about the clinical condition of the patient and the plan for disposition likely tomorrow  Consultants:  Neurosurgery Palliative care Critical care  Procedures:  None  Antimicrobials:  None  Anti-infectives (From admission, onward)    None        Subjective: Today, patient was seen and examined at bedside.  Complains of blurred vision but no intractable headache today.  Denies overt dizziness lightheadedness.  Objective: Vitals:   03/28/22 2327 03/29/22 0359 03/29/22 0738 03/29/22 1140  BP: 132/68 134/72 116/72 117/79  Pulse: (!) 58 62 (!) 59 (!) 59  Resp: '16 15 16 16  '$ Temp: 98.3 F (36.8 C) 98 F (36.7 C) 98.1 F (36.7 C) 98.6 F (37 C)  TempSrc:  Oral Oral Oral  SpO2: 93% 95% 96% 95%  Weight:      Height:       No intake or output data in the 24 hours ending 03/29/22 1712 Filed Weights   03/27/22 1740 03/28/22 0206  Weight: 46.4 kg 46.6 kg    Physical Examination: Body mass index is 18.49 kg/m.  General: Thinly built, not in obvious distress, elderly female, Communicative HENT:   No scleral pallor or icterus noted. Oral mucosa is moist.  Chest:  Clear breath sounds.  Diminished breath sounds bilaterally. No crackles or wheezes.  CVS: S1 &S2 heard. No murmur.  Regular rate and rhythm. Abdomen: Soft, nontender, nondistended.  Bowel sounds are heard.   Extremities: No cyanosis, clubbing or edema.  Peripheral pulses are palpable. Psych: Alert, awake and oriented, normal mood CNS:  No cranial nerve deficits.  Moves all extremities. Skin: Warm and dry.  No rashes noted.  Data Reviewed:   CBC: Recent Labs  Lab 03/27/22 1843 03/28/22 0931  WBC 5.3 5.0  NEUTROABS 2.4  --   HGB 13.5 13.2  HCT 40.2 39.8  MCV 92.4 92.8  PLT 191 182     Basic Metabolic Panel: Recent Labs  Lab 03/27/22 1843 03/28/22 0931  NA 138 136  K 3.9 3.3*  CL 106 108  CO2 26 23  GLUCOSE 100* 188*  BUN 22 12  CREATININE 0.77 0.61  CALCIUM 9.0 8.0*  MG  --  2.1  PHOS  --  2.3*    Liver Function Tests: Recent Labs  Lab 03/27/22 1843  AST 31  ALT 32  ALKPHOS 65  BILITOT 0.6  PROT 7.1  ALBUMIN 4.1     Radiology Studies: CT Head Wo Contrast  Result Date: 03/27/2022 CLINICAL DATA:  Hypertension and occipital headache. History of brain aneurysm. EXAM: CT HEAD WITHOUT CONTRAST TECHNIQUE: Contiguous axial images were obtained from the base of the skull through the vertex without intravenous contrast. RADIATION DOSE REDUCTION: This exam was performed according to the departmental dose-optimization program which includes automated exposure control, adjustment of the mA and/or kV according to patient size and/or use of iterative reconstruction technique. COMPARISON:  CT head 03/25/2020 FINDINGS: Brain: There is trace subarachnoid hemorrhage in the left central sulcus (2-26, 5-41). There is no other acute intracranial hemorrhage or extra-axial fluid collection. There is no evidence of acute territorial infarct. Background parenchymal volume is stable. The ventricles are stable in size. Patchy hypodensity in the supratentorial white matter likely reflects sequela of underlying chronic small vessel ischemic change. There is no mass lesion there is no mass effect or midline shift. Vascular: There is calcification of the bilateral carotid siphons. There is large basilar tip aneurysm measuring up to 1.8 cm in maximum dimension, increased in size from 1.3 cm in 2021. Skull: Normal. Negative for fracture or focal lesion. Sinuses/Orbits: Postsurgical changes are noted in the paranasal sinuses. There is layering fluid in the bilateral maxillary sinuses with mucosal thickening and hyperostosis consistent with chronic sinusitis. Bilateral lens implants and a right  glaucoma valve are noted. Other: None. IMPRESSION: 1. Trace subarachnoid hemorrhage in the left central sulcus. 2. Large basilar tip aneurysm is increased in size since 2021. This is felt unlikely to be the source of the subarachnoid hemorrhage. Critical Value/emergent results were called by telephone at the time of interpretation on 03/27/2022 at 6:15 pm to provider [facility] , who verbally acknowledged these results. Electronically Signed   By: Valetta Mole M.D.   On: 03/27/2022 18:16      LOS: 1 day    Flora Lipps, MD [facility] Available via Epic secure chat 7am-7pm After these hours, please refer to coverage provider listed on [email or web address] 03/29/2022, 5:12 PM

## 2022-03-29 NOTE — Consult Note (Signed)
Palliative Medicine Inpatient Consult Note  Consulting Provider: Dorethea Clan, DO  Reason for consult:   [facility] Consult  Reason for Consult? patient follows with palliative; consult for ongoing goals of care discussion   03/29/2022  HPI:  Per intake H&P --> 88 year old woman who presented to [facility] 9/6 with headache and hypertension.  PMHx significant for HTN, prolonged QT, asthma, GERD, anxiety, persistent occipital headache in the setting of known basilar artery aneurysm, PVD, glaucoma. Admitted in the setting of headache from trace subarachnoid hemorrhage . Neurosurgery has evaluated and recommended no invasive interventions.   [facility] has been following Cassandra Sims for ongoing conversations.  Clinical Assessment/Goals of Care:  *Please note that this is a verbal dictation therefore any spelling or grammatical errors are due to the "Jack One" system interpretation.  I have reviewed medical records including EPIC notes, labs and imaging, received report from bedside RN, assessed the patient.    I met with Cassandra Sims to further discuss diagnosis prognosis, GOC, EOL wishes, disposition and options.   I introduced Palliative Medicine as specialized medical care for people living with serious illness. It focuses on providing relief from the symptoms and stress of a serious illness. The goal is to improve quality of life for both the patient and the family.  Medical History Review and Understanding:  Cassandra Sims understands that she has a history of hypertension, asthma, glucoma worse in her right eye.   Social History:  Cassandra Sims is from [facility].  She shares she is lived in the state of [place] throughout the duration of her life.  She was married at 1 point in time though is since divorced.  She has 4 children, 2 daughters and 2 sons as well as 4 granddaughters and 4  grandsons.  She used to work in Academic librarian more specifically.  She lives spending time with her family, doing puzzles, meeting up with friends, and being a participant in her church.  She is a woman of faith and practices within the [facility] denomination.  Functional and Nutritional State:  Prior to Cassandra Sims's admission she had been living independently in a townhome.  She had been doing all her B ADLs and IADLs independently.  She continues to drive short distances.  She does have a cane at home.  Presently no using a walker though per PT notes may benefit from this in the future.   Advance Directives:  A detailed discussion was had today regarding advanced directives.  Patient shares that she does have a living will and her daughter Cassandra Sims would be her surrogate decision-maker  Code Status:  Patient is an established DNAR/DNI CODE STATUS.  She shares she would never want long-term artificial life support and is at peace "when it is her time".  Discussion:  [name] and I discussed her reason for admission in the setting of her known aneurysm and subarachnoid hemorrhage.  We discussed that per conversation with the neurosurgery team there is nothing invasive to be offered nor anything that she would want done.  Eltha vocalizes that she has at this time an excellent quality of life though may require some more help at home after discharge.  We reviewed that she is presently getting outpatient palliative support which is something she would like to continue upon discharge.  Patient is fully aware and alert of the circumstances surrounding hospitalization and the ongoing risk of spontaneous death.  Despite  this she vocalizes living a robust life and wanting to continue doing what she has been doing.  Discussed the importance of continued conversation with family and their  medical providers regarding overall plan of care and treatment options, ensuring decisions are within the  context of the patients values and GOCs. _____________________ Addendum:  I called and updated Cassandra Sims on the above conversation. She is hopeful that one discharge patient can get a walker,   Cassandra Sims shares that she and her sister will talk to [place] about additional care and support at home.   Decision Maker: Patient can presently make decisions for herself though vocalizes that her daughter Cassandra Sims would be her surrogate decision-maker  Cassandra Sims Daughter [phone]   [phone]    SUMMARY OF RECOMMENDATIONS   DNAR/DNI  A MOST form was introduced and reviewed which patient plans to fill out with outpatient palliative team  Patient is aware of risks of aneurysm she has been knowledgeable of her many years  [facility] will continue to follow Cassandra Sims upon discharge  Goals are clear for continued improvement  Patient and family hopeful for DME walker on discharge  Discharge once medically optimized by primary care team  Palliative care will be available if additional needs arise  Code Status/Advance Care Planning: DNAR/DNI  Palliative Prophylaxis:  Aspiration, Bowel Regimen, Delirium Protocol, Frequent Pain Assessment, Oral Care, Palliative Wound Care, and Turn Reposition  Additional Recommendations (Limitations, Scope, Preferences): Continue current care  Psycho-social/Spiritual:  Desire for further Chaplaincy support: Yes Additional Recommendations: Education on basilar artery and rhythm   Prognosis: Patient could spontaneously die at any point if aneurysm ruptures which she is aware of  Discharge Planning: Discharge home once medically optimized with continued outpatient palliative support  Vitals:   03/28/22 2327 03/29/22 0359  BP: 132/68 134/72  Pulse: (!) 58 62  Resp: 16 15  Temp: 98.3 F (36.8 C) 98 F (36.7 C)  SpO2: 93% 95%    Intake/Output Summary (Last 24 hours) at 03/29/2022 9242 Last data filed  at 03/28/2022 1603 Gross per 24 hour  Intake 89.54 ml  Output 400 ml  Net -310.46 ml   Last Weight  Most recent update: 03/28/2022  2:12 AM    Weight  46.6 kg (102 lb 11.8 oz)            Gen: Elderly caucasian female in no acute distress HEENT: moist mucous membranes CV: Regular rate and rhythm PULM: On room air breathing is even and unlabored ABD: soft/nontender EXT: No edema Neuro: Alert and oriented x3  PPS: 70%   This conversation/these recommendations were discussed with patient primary care team, Dr. Louanne Belton  Billing based on MDM: High  Problems Addressed: One acute or chronic illness or injury that poses a threat to life or bodily function  Amount and/or Complexity of Data: Category 3:Discussion of management or test interpretation with external physician/other qualified health care professional/appropriate source (not separately reported)  Risks: Decision regarding hospitalization or escalation of hospital care and Decision not to resuscitate or to de-escalate care because of poor prognosis ______________________________________________________ [facility] Team Team Cell Phone: [phone] Please utilize secure chat with additional questions, if there is no response within 30 minutes please call the above phone number  Palliative Medicine Team providers are available by phone from 7am to 7pm daily and can be reached through the team cell phone.  Should this patient require assistance outside of these hours, please call the  patient's attending physician.

## 2022-03-29 NOTE — Evaluation (Signed)
Physical Therapy Evaluation Patient Details Name: Cassandra Sims MRN: [id number] DOB: 1933-07-08 Today's Date: 03/29/2022  History of Present Illness  Pt is an 88 y.o. female who presented to the ED 9/6 with c/o headache and hypertension. CT revealed trace subarachnoid hemorrhage in the left central sulcus and large basilar tip aneurysm, increased in size since 2021. PMH:  HTN, prolonged QT, asthma, GERD, anxiety, persistent occipital headache in the setting of known basilar artery aneurysm, PVD, glaucoma.   Clinical Impression  Pt admitted with above diagnosis. PTA pt lived alone, I/mod I mobility without AD. She drives and does her own grocery shopping. She is active with home palliative services through hospice. Pt currently with functional limitations due to the deficits listed below (see PT Problem List). On eval, pt required supervision transfers, and min guard assist ambulation 175' with RW. Pt reports mobility worsens her headache. Educated pt on energy conservation techniques to maximize independence. Pt will benefit from skilled PT to increase their independence and safety with mobility to allow discharge home. PT to follow acutely. Pt declining following up services. She reports she exercises herself regularly and has no concerns regarding mobility. Pt is in agreement to utilize RW vs rollator at d/c.          Recommendations for follow up therapy are one component of a multi-disciplinary discharge planning process, led by the attending physician.  Recommendations may be updated based on patient status, additional functional criteria and insurance authorization.  Follow Up Recommendations No PT follow up      Assistance Recommended at Discharge PRN  Patient can return home with the following       Equipment Recommendations    Recommendations for Other Services       Functional Status Assessment Patient has had a recent decline in their functional status and demonstrates the  ability to make significant improvements in function in a reasonable and predictable amount of time.     Precautions / Restrictions Precautions Precautions: Fall Precaution Comments: Pt reports multi near falls at home. Able to 'catch' herself on wall or furniture.      Mobility  Bed Mobility Overal bed mobility: Modified Independent                  Transfers Overall transfer level: Needs assistance Equipment used: Rolling walker (2 wheels) Transfers: Sit to/from Stand Sit to Stand: Supervision           General transfer comment: increased time    Ambulation/Gait Ambulation/Gait assistance: Min guard Gait Distance (Feet): 175 Feet Assistive device: Rolling walker (2 wheels) Gait Pattern/deviations: Step-through pattern, Decreased stride length Gait velocity: decreased Gait velocity interpretation: <1.31 ft/sec, indicative of household ambulator   General Gait Details: slow, guarded gait. Pt reporting mobility increases her headache pain.  Stairs            Wheelchair Mobility    Modified Rankin (Stroke Patients Only)       Balance Overall balance assessment: Needs assistance Sitting-balance support: No upper extremity supported, Feet supported Sitting balance-Leahy Scale: Good     Standing balance support: Bilateral upper extremity supported, During functional activity, Reliant on assistive device for balance Standing balance-Leahy Scale: Poor                               Pertinent Vitals/Pain Pain Assessment Pain Assessment: 0-10 Pain Score: 5  Pain Location: posterior head Pain Descriptors / Indicators: Headache Pain  Intervention(s): Limited activity within patient's tolerance, Monitored during session, Repositioned    Home Living Family/patient expects to be discharged to:: Private residence Living Arrangements: Alone Available Help at Discharge: Family;Available PRN/intermittently (daughter) Type of Home: House  (townhouse) Home Access: Stairs to enter   [employer] of Steps: 1   Home Layout: One level Home Equipment: Conservation officer, nature (2 wheels);Rollator (4 wheels) Additional Comments: Active with Home Palliative Services through hospice.    Prior Function Prior Level of Function : Independent/Modified Independent;Driving             Mobility Comments: amb without AD. Driving. Grocery shopping using shopping cart for stability.       Hand Dominance        Extremity/Trunk Assessment   Upper Extremity Assessment Upper Extremity Assessment: Overall WFL for tasks assessed    Lower Extremity Assessment Lower Extremity Assessment: Overall WFL for tasks assessed    Cervical / Trunk Assessment Cervical / Trunk Assessment: Kyphotic  Communication   Communication: No difficulties  Cognition Arousal/Alertness: Awake/alert Behavior During Therapy: WFL for tasks assessed/performed Overall Cognitive Status: Within Functional Limits for tasks assessed                                          General Comments      Exercises     Assessment/Plan    PT Assessment Patient needs continued PT services  PT Problem List Decreased mobility;Decreased activity tolerance;Decreased balance;Decreased knowledge of use of DME;Pain       PT Treatment Interventions DME instruction;Therapeutic activities;Gait training;Therapeutic exercise;Patient/family education;Stair training;Balance training;Functional mobility training    PT Goals (Current goals can be found in the Care Plan section)  Acute Rehab PT Goals Patient Stated Goal: stay as independent as possible PT Goal Formulation: With patient Time For Goal Achievement: 04/12/22 Potential to Achieve Goals: Fair    Frequency Min 4X/week     Co-evaluation               AM-PAC PT "6 Clicks" Mobility  Outcome Measure Help needed turning from your back to your side while in a flat bed without using  bedrails?: None Help needed moving from lying on your back to sitting on the side of a flat bed without using bedrails?: None Help needed moving to and from a bed to a chair (including a wheelchair)?: A Little Help needed standing up from a chair using your arms (e.g., wheelchair or bedside chair)?: A Little Help needed to walk in hospital room?: A Little Help needed climbing 3-5 steps with a railing? : A Little 6 Click Score: 20    End of Session Equipment Utilized During Treatment: Gait belt Activity Tolerance: Patient tolerated treatment well Patient left: in bed;with call bell/phone within reach;with bed alarm set Nurse Communication: Mobility status PT Visit Diagnosis: Unsteadiness on feet (R26.81)    Time: 7209-4709 PT Time Calculation (min) (ACUTE ONLY): 15 min   Charges:   PT Evaluation $PT Eval Moderate Complexity: 1 Mod          Lorrin Goodell, PT  Office # [phone] Pager [phone]   Lorriane Shire 03/29/2022, 8:42 AM

## 2022-03-29 NOTE — Evaluation (Signed)
Occupational Therapy Evaluation Patient Details Name: Cassandra Sims MRN: [id number] DOB: August 12, 1932 Today's Date: 03/29/2022   History of Present Illness Pt is an 88 y.o. female who presented to the ED 9/6 with c/o headache and hypertension. CT revealed trace subarachnoid hemorrhage in the left central sulcus and large basilar tip aneurysm, increased in size since 2021. PMH:  HTN, prolonged QT, asthma, GERD, anxiety, persistent occipital headache in the setting of known basilar artery aneurysm, PVD, glaucoma.   Clinical Impression   PTA patient independent with ADLs, IADLs and mobility. She was admitted for above and is limited by problem list below, including impaired balance, headache pain, decreased activity tolerance.  She currently is able to complete transfers and mobility in room with min guard using RW, ADLs with up to min guard assist.  Discussed compensatory techniques and energy conservation techniques for ADLs due to increased pain with bending forward.  Pt has reacher and long sponge to assist her as needed.  Encouraged pt to have family assist with IADLs, and to have initial 24/7 support at home for safety.  She demonstrates good awareness and understanding of recommendations provided.  Will follow acutely, but anticipate no further needs after dc home.       Recommendations for follow up therapy are one component of a multi-disciplinary discharge planning process, led by the attending physician.  Recommendations may be updated based on patient status, additional functional criteria and insurance authorization.   Follow Up Recommendations  No OT follow up    Assistance Recommended at Discharge Frequent or constant Supervision/Assistance (initally, fading to PRN)  Patient can return home with the following A little help with walking and/or transfers;A little help with bathing/dressing/bathroom;Assistance with cooking/housework;Assist for transportation;Help with stairs or ramp for  entrance    Functional Status Assessment  Patient has had a recent decline in their functional status and demonstrates the ability to make significant improvements in function in a reasonable and predictable amount of time.  Equipment Recommendations  None recommended by OT    Recommendations for Other Services       Precautions / Restrictions Precautions Precautions: Fall Precaution Comments: Pt reports multi near falls at home. Able to 'catch' herself on wall or furniture. Restrictions Weight Bearing Restrictions: No      Mobility Bed Mobility Overal bed mobility: Modified Independent                  Transfers                          Balance Overall balance assessment: Needs assistance Sitting-balance support: No upper extremity supported, Feet supported Sitting balance-Leahy Scale: Good     Standing balance support: Bilateral upper extremity supported, During functional activity, No upper extremity supported Standing balance-Leahy Scale: Poor Standing balance comment: relies on BUE support dynamically                           ADL either performed or assessed with clinical judgement   ADL Overall ADL's : Needs assistance/impaired     Grooming: Min guard;Standing           Upper Body Dressing : Set up;Sitting   Lower Body Dressing: Min guard;Sit to/from stand   Toilet Transfer: Min guard;Ambulation;Rolling walker (2 wheels)           Functional mobility during ADLs: Min guard;Rolling walker (2 wheels) General ADL Comments: reviewed compensatory techniques for  ADL to avoid bending forward, energy conservation techniques- pt very receptive.     Vision Baseline Vision/History: 1 Wears glasses Ability to See in Adequate Light: 0 Adequate Patient Visual Report: Other (comment) ("foggy" since headahce; R eye glaucoma) Vision Assessment?: No apparent visual deficits     Perception     Praxis      Pertinent Vitals/Pain  Pain Assessment Pain Assessment: 0-10 Pain Score: 2  Pain Location: posterior head Pain Descriptors / Indicators: Headache Pain Intervention(s): Limited activity within patient's tolerance, Monitored during session, Repositioned     Hand Dominance Right   Extremity/Trunk Assessment Upper Extremity Assessment Upper Extremity Assessment: Overall WFL for tasks assessed   Lower Extremity Assessment Lower Extremity Assessment: Defer to PT evaluation   Cervical / Trunk Assessment Cervical / Trunk Assessment: Kyphotic   Communication Communication Communication: No difficulties   Cognition Arousal/Alertness: Awake/alert Behavior During Therapy: WFL for tasks assessed/performed Overall Cognitive Status: Within Functional Limits for tasks assessed                                       General Comments  Pt reports dizzy and "foggy" throughout session. BP assessed with SBP<160 throughout session (supine 130/80, sitting 124/76, standing 109/75)    Exercises     Shoulder Instructions      Home Living Family/patient expects to be discharged to:: Private residence Living Arrangements: Alone Available Help at Discharge: Family;Available PRN/intermittently (daughter) Type of Home: House (townhouse) Home Access: Stairs to enter Technical brewer of Steps: 1   Home Layout: One level     Bathroom Shower/Tub: Occupational psychologist: Standard     Home Equipment: Conservation officer, nature (2 wheels);Rollator (4 wheels);Shower seat;Grab bars - tub/shower   Additional Comments: Active with Home Palliative Services through hospice.      Prior Functioning/Environment Prior Level of Function : Independent/Modified Independent;Driving             Mobility Comments: amb without AD. Driving. Grocery shopping using shopping cart for stability. ADLs Comments: indepedent ADLs, IADLs and meds. short distance Driving. Grocery shopping using shopping cart for  stability.        OT Problem List: Decreased activity tolerance;Impaired balance (sitting and/or standing);Decreased knowledge of use of DME or AE;Decreased knowledge of precautions;Pain      OT Treatment/Interventions: Self-care/ADL training;DME and/or AE instruction;Therapeutic activities;Balance training;Patient/family education;Energy conservation    OT Goals(Current goals can be found in the care plan section) Acute Rehab OT Goals Patient Stated Goal: get home OT Goal Formulation: With patient Time For Goal Achievement: 04/12/22 Potential to Achieve Goals: Good  OT Frequency: Min 2X/week    Co-evaluation              AM-PAC OT "6 Clicks" Daily Activity     Outcome Measure Help from another person eating meals?: None Help from another person taking care of personal grooming?: A Little Help from another person toileting, which includes using toliet, bedpan, or urinal?: A Little Help from another person bathing (including washing, rinsing, drying)?: A Little Help from another person to put on and taking off regular upper body clothing?: A Little Help from another person to put on and taking off regular lower body clothing?: A Little 6 Click Score: 19   End of Session Equipment Utilized During Treatment: Rolling walker (2 wheels) Nurse Communication: Mobility status  Activity Tolerance: Patient tolerated treatment well Patient left: in  bed;with call bell/phone within reach;with bed alarm set  OT Visit Diagnosis: Other abnormalities of gait and mobility (R26.89);Muscle weakness (generalized) (M62.81);Pain Pain - part of body:  (headache)                Time: 2951-8841 OT Time Calculation (min): 26 min Charges:  OT General Charges $OT Visit: 1 Visit OT Evaluation $OT Eval Moderate Complexity: 1 Mod OT Treatments $Self Care/Home Management : 8-22 mins  Jolaine Artist, OT Acute Rehabilitation Services Office [phone]   Delight Stare 03/29/2022, 1:25 PM

## 2022-03-30 DIAGNOSIS — I609 Nontraumatic subarachnoid hemorrhage, unspecified: Secondary | ICD-10-CM | POA: Diagnosis not present

## 2022-03-30 DIAGNOSIS — I160 Hypertensive urgency: Secondary | ICD-10-CM | POA: Diagnosis not present

## 2022-03-30 MED ORDER — CYCLOBENZAPRINE HCL 5 MG PO TABS: 5.0000 mg | ORAL_TABLET | Freq: Three times a day (TID) | ORAL | 0 refills | Status: DC | PRN

## 2022-03-30 MED ORDER — TRAMADOL HCL 50 MG PO TABS: 25.0000 mg | ORAL_TABLET | Freq: Three times a day (TID) | ORAL | 0 refills | Status: DC | PRN

## 2022-03-30 NOTE — Discharge Summary (Signed)
Physician Discharge Summary  Cassandra Sims SWH:[id number] DOB: 04/24/1933 DOA: 03/27/2022  PCP: Jinny Blossom, [facility]  Admit date: 03/27/2022 Discharge date: 03/30/2022  Admitted From: Home  Discharge disposition: Home  Recommendations for Outpatient Follow-Up:   Follow up with your primary care provider in one week.  Will need good blood pressure control as outpatient. Check CBC, BMP, magnesium in the next visit   Discharge Diagnosis:   Principal Problem:   SAH (subarachnoid hemorrhage) (HCC) Active Problems:   Hypertensive urgency  Discharge Condition: Improved.  Diet recommendation: Low sodium, heart healthy.    Wound care: None.  Code status: DNR   History of Present Illness:    88 year old female with past medical history of hypertension, asthma, GERD, anxiety, history of basilar artery aneurysm with persistent headache, peripheral vascular disease, glaucoma presented to the hospital with headache.  CT head demonstrated trace subarachnoid hemorrhage in the left central sulcus and large basilar tip aneurysm increased from 2021.  Nicardipine was started and was admitted to the ICU.  Patient was seen by neurosurgery and critical care.  Nicardipine drip was subsequently weaned off and patient was started on oral antihypertensive.  Subsequently was transferred out of ICU.  Hospital Course:   Following conditions were addressed during hospitalization as listed below,   Trace subarachnoid hemorrhage with known basilar artery anerusym Increased size of basilar artery aneurysm.  Neurosurgery was consulted and patient is not a surgical candidate.  Patient was admitted to ICU for blood pressure control.  Patient is on palliative care as outpatient.  We will continue tramadol and muscle relaxants on discharge for pain control.  Palliative care was consulted during hospitalization and plan is to proceed with palliative care on discharge.  We will need good blood pressure  control after discharge.   Essential hypertension Patient will resume amlodipine, Benicar, labetalol on discharge.   Asthma Continue home Dulera, albuterol as needed.  On room air.  Compensated   Glaucoma Continue dorzolamide and latanoprost drops.  Disposition.  At this time, patient is stable for disposition home with outpatient PCP follow-up.  Spoke with the patient's family regarding disposition on 03/29/2022.  Medical Consultants:   Neurosurgery Palliative care Critical care  Procedures:    None Subjective:   Today, patient patient was seen and examined at bedside.  Patient denies any nausea vomiting fever headache or chills.  Discharge Exam:   Vitals:   03/30/22 0731 03/30/22 0857  BP: (!) 137/90   Pulse: 62 64  Resp: 16 16  Temp: 97.9 F (36.6 C)   SpO2: 97% 98%   Vitals:   03/30/22 0440 03/30/22 0446 03/30/22 0731 03/30/22 0857  BP: (!) 158/88  (!) 137/90   Pulse: (!) 59  62 64  Resp: '20  16 16  '$ Temp: 97.6 F (36.4 C)  97.9 F (36.6 C)   TempSrc: Oral  Oral   SpO2: 98%  97% 98%  Weight:  46 kg    Height:       General: Alert awake, not in obvious distress, thinly built, elderly female. HENT: pupils equally reacting to light,  No scleral pallor or icterus noted. Oral mucosa is moist.  No neck stiffness Chest:  Clear breath sounds.  Diminished breath sounds bilaterally. No crackles or wheezes.  CVS: S1 &S2 heard. No murmur.  Regular rate and rhythm. Abdomen: Soft, nontender, nondistended.  Bowel sounds are heard.   Extremities: No cyanosis, clubbing or edema.  Peripheral pulses are palpable. Psych: Alert, awake and  oriented, normal mood CNS:  No cranial nerve deficits.  Moves all extremities. Skin: Warm and dry.  No rashes noted.  The results of significant diagnostics from this hospitalization (including imaging, microbiology, ancillary and laboratory) are listed below for reference.     Diagnostic Studies:   CT Head Wo Contrast  Result Date:  03/27/2022 CLINICAL DATA:  Hypertension and occipital headache. History of brain aneurysm. EXAM: CT HEAD WITHOUT CONTRAST TECHNIQUE: Contiguous axial images were obtained from the base of the skull through the vertex without intravenous contrast. RADIATION DOSE REDUCTION: This exam was performed according to the departmental dose-optimization program which includes automated exposure control, adjustment of the mA and/or kV according to patient size and/or use of iterative reconstruction technique. COMPARISON:  CT head 03/25/2020 FINDINGS: Brain: There is trace subarachnoid hemorrhage in the left central sulcus (2-26, 5-41). There is no other acute intracranial hemorrhage or extra-axial fluid collection. There is no evidence of acute territorial infarct. Background parenchymal volume is stable. The ventricles are stable in size. Patchy hypodensity in the supratentorial white matter likely reflects sequela of underlying chronic small vessel ischemic change. There is no mass lesion there is no mass effect or midline shift. Vascular: There is calcification of the bilateral carotid siphons. There is large basilar tip aneurysm measuring up to 1.8 cm in maximum dimension, increased in size from 1.3 cm in 2021. Skull: Normal. Negative for fracture or focal lesion. Sinuses/Orbits: Postsurgical changes are noted in the paranasal sinuses. There is layering fluid in the bilateral maxillary sinuses with mucosal thickening and hyperostosis consistent with chronic sinusitis. Bilateral lens implants and a right glaucoma valve are noted. Other: None. IMPRESSION: 1. Trace subarachnoid hemorrhage in the left central sulcus. 2. Large basilar tip aneurysm is increased in size since 2021. This is felt unlikely to be the source of the subarachnoid hemorrhage. Critical Value/emergent results were called by telephone at the time of interpretation on 03/27/2022 at 6:15 pm to provider [facility] , who verbally acknowledged these results.  Electronically Signed   By: Valetta Mole M.D.   On: 03/27/2022 18:16     Labs:   Basic Metabolic Panel: Recent Labs  Lab 03/27/22 1843 03/28/22 0931  NA 138 136  K 3.9 3.3*  CL 106 108  CO2 26 23  GLUCOSE 100* 188*  BUN 22 12  CREATININE 0.77 0.61  CALCIUM 9.0 8.0*  MG  --  2.1  PHOS  --  2.3*   GFR Estimated Creatinine Clearance: 36 mL/min (by C-G formula based on SCr of 0.61 mg/dL). Liver Function Tests: Recent Labs  Lab 03/27/22 1843  AST 31  ALT 32  ALKPHOS 65  BILITOT 0.6  PROT 7.1  ALBUMIN 4.1   No results for input(s): "LIPASE", "AMYLASE" in the last 168 hours. No results for input(s): "AMMONIA" in the last 168 hours. Coagulation profile Recent Labs  Lab 03/27/22 1843  INR 1.1    CBC: Recent Labs  Lab 03/27/22 1843 03/28/22 0931  WBC 5.3 5.0  NEUTROABS 2.4  --   HGB 13.5 13.2  HCT 40.2 39.8  MCV 92.4 92.8  PLT 191 182   Cardiac Enzymes: No results for input(s): "CKTOTAL", "CKMB", "CKMBINDEX", "TROPONINI" in the last 168 hours. BNP: Invalid input(s): "POCBNP" CBG: No results for input(s): "GLUCAP" in the last 168 hours. D-Dimer No results for input(s): "DDIMER" in the last 72 hours. Hgb A1c No results for input(s): "HGBA1C" in the last 72 hours. Lipid Profile No results for input(s): "CHOL", "HDL", "LDLCALC", "  TRIG", "CHOLHDL", "LDLDIRECT" in the last 72 hours. Thyroid function studies No results for input(s): "TSH", "T4TOTAL", "T3FREE", "THYROIDAB" in the last 72 hours.  Invalid input(s): "FREET3" Anemia work up No results for input(s): "VITAMINB12", "FOLATE", "FERRITIN", "TIBC", "IRON", "RETICCTPCT" in the last 72 hours. Microbiology Recent Results (from the past 240 hour(s))  MRSA Next Gen by PCR, Nasal     Status: None   Collection Time: 03/28/22  2:08 AM   Specimen: Nasal Mucosa; Nasal Swab  Result Value Ref Range Status   MRSA by PCR Next Gen NOT DETECTED NOT DETECTED Final    Comment: (NOTE) The GeneXpert MRSA Assay (FDA  approved for NASAL specimens only), is one component of a comprehensive MRSA colonization surveillance program. It is not intended to diagnose MRSA infection nor to guide or monitor treatment for MRSA infections. Test performance is not FDA approved in patients less than 33 years old. Performed at [facility], [place] [address], [place], [place] [address]      Discharge Instructions:   Discharge Instructions     Diet general   Complete by: As directed    Discharge instructions   Complete by: As directed    Follow-up with your primary care physician in 1 week.  Focus on good BP control. Seek medical attention for worsening symptoms.  Continue palliative care on discharge.   Increase activity slowly   Complete by: As directed       Allergies as of 03/30/2022       Reactions   Penicillins Shortness Of Breath, Swelling   Has patient had a PCN reaction causing immediate rash, facial/tongue/throat swelling, SOB or lightheadedness with hypotension: Yes Has patient had a PCN reaction causing severe rash involving mucus membranes or skin necrosis: No Has patient had a PCN reaction that required hospitalization: No Has patient had a PCN reaction occurring within the last 10 years: No If all of the above answers are "NO", then may proceed with Cephalosporin use.   Zantac [ranitidine] Itching   Apraclonidine Other (See Comments)   Very light sensitive, swollen eye   Brinzolamide Other (See Comments)   Doxycycline Other (See Comments)   Pt states she tolerates OKAY   Lactose Other (See Comments)   Adhesive [tape] Itching   BAND-AID TAPE-skin redness/itching   Clindamycin/lincomycin Other (See Comments)   Severe joint pain.   Codeine Nausea And Vomiting   Darvocet [propoxyphene N-acetaminophen] Other (See Comments)   HTN   Dilaudid [hydromorphone Hcl] Nausea And Vomiting   DROP BLOOD PRESSURE HYPOTENSIVE   Flagyl [metronidazole] Other (See Comments)   Joint pains   Lactose  Intolerance (gi)    Levofloxacin    Joint pain   Macrodantin [nitrofurantoin Macrocrystal]    Morphine And Related    hallucinate   Other    Pedi-pre Tape Spray [wound Dressing Adhesive]    Premarin [conjugated Estrogens] Itching   Tramadol Hcl Other (See Comments)   Causes insomnia   Vancomycin Hives, Itching   Zofran [ondansetron Hcl]    HTN   Clarithromycin    Other reaction(s): Abdominal Pain Anxious/nervous feeling   Denosumab Rash   Sulfa Antibiotics Rash        Medication List     TAKE these medications    acetaminophen 500 MG tablet Commonly known as: TYLENOL Take 500-1,000 mg by mouth every 8 (eight) hours as needed for mild pain.   ACIDOPHILUS PO Take 1 capsule by mouth daily.   albuterol 108 (90 Base) MCG/ACT inhaler  Commonly known as: VENTOLIN HFA Inhale 2 puffs into the lungs every 4 (four) hours as needed for wheezing. Reported on 10/10/2015   amLODipine 2.5 MG tablet Commonly known as: NORVASC Take 2.5 mg by mouth 2 (two) times daily.   Astepro 0.15 % Soln Generic drug: Azelastine HCl Place 1 spray into the nose at bedtime. What changed: Another medication with the same name was removed. Continue taking this medication, and follow the directions you see here.   carboxymethylcellulose 0.5 % Soln Commonly known as: REFRESH PLUS Place 1 drop into both eyes 5 (five) times daily as needed (for dry eyes).   Cholecalciferol 25 MCG (1000 UT) tablet Take 1,000 Units by mouth daily.   cyclobenzaprine 5 MG tablet Commonly known as: FLEXERIL Take 1 tablet (5 mg total) by mouth 3 (three) times daily as needed for muscle spasms.   denosumab 60 MG/ML Sosy injection Commonly known as: PROLIA Inject 60 mg into the skin every 6 (six) months.   dorzolamide 2 % ophthalmic solution Commonly known as: TRUSOPT Place 1 drop into both eyes 3 (three) times daily.   Dulera 100-5 MCG/ACT Aero Generic drug: mometasone-formoterol INHALE 1 PUFF INTO THE LUNGS  DAILY. WITH SPACER AND RINSE MOUTH AFTERWARDS. What changed: See the new instructions.   escitalopram 5 MG tablet Commonly known as: LEXAPRO Take 2.5 mg by mouth at bedtime.   labetalol 100 MG tablet Commonly known as: NORMODYNE Take 1 tablet by mouth 2 (two) times daily.   LUTEIN PO Take 20 mg by mouth daily.   multivitamin with minerals Tabs tablet Take 1 tablet by mouth daily. Nature Made Multi Vitamin for Her 50+ W/No Iron   olmesartan 40 MG tablet Commonly known as: BENICAR Take 40 mg by mouth every evening.   psyllium 58.6 % powder Commonly known as: METAMUCIL Take 1 packet by mouth daily. 14.3 g (1 heaping tablespoon) every morning.   Rocklatan 0.02-0.005 % Soln Generic drug: Netarsudil-Latanoprost Place 1 drop into both eyes at bedtime.   traMADol 50 MG tablet Commonly known as: ULTRAM Take 0.5 tablets (25 mg total) by mouth every 8 (eight) hours as needed for severe pain.   TUMS PO Take 1,000 mg by mouth daily.   Turmeric 450 MG Caps Take 450 mg by mouth daily with lunch.        Follow-up Information     [facility] Follow up in 1 week(s).   Contact information: [address] [place] [address] [phone]         [facility] Follow up in 1 week(s).   Contact information: [address] [place] [address] [phone]                  Time coordinating discharge: 39 minutes  Signed:  Stellan Vick  [facility] 03/30/2022, 3:14 PM

## 2022-03-30 NOTE — Discharge Planning (Signed)
Pt discharged to home with daughter.  Accompanied pt in wheelchair to hospital door.  All pt belongings with pt.  Pt and daughter verbalized understanding of discharge instructions.

## 2022-04-10 ENCOUNTER — Other Ambulatory Visit: Payer: Self-pay

## 2022-04-10 ENCOUNTER — Emergency Department (HOSPITAL_COMMUNITY)
Admission: EM | Admit: 2022-04-10 | Discharge: 2022-04-11 | Disposition: A | Discharge status: Home / Self Care | Payer: Medicare Other | Attending: Emergency Medicine | Admitting: Emergency Medicine

## 2022-04-10 ENCOUNTER — Emergency Department (HOSPITAL_COMMUNITY): Payer: Medicare Other

## 2022-04-10 DIAGNOSIS — Z79899 Other long term (current) drug therapy: Secondary | ICD-10-CM | POA: Diagnosis not present

## 2022-04-10 DIAGNOSIS — R55 Syncope and collapse: Secondary | ICD-10-CM | POA: Diagnosis not present

## 2022-04-10 DIAGNOSIS — Z20822 Contact with and (suspected) exposure to covid-19: Secondary | ICD-10-CM | POA: Insufficient documentation

## 2022-04-10 DIAGNOSIS — I10 Essential (primary) hypertension: Secondary | ICD-10-CM | POA: Insufficient documentation

## 2022-04-10 DIAGNOSIS — R519 Headache, unspecified: Secondary | ICD-10-CM | POA: Diagnosis present

## 2022-04-10 DIAGNOSIS — R011 Cardiac murmur, unspecified: Secondary | ICD-10-CM | POA: Diagnosis not present

## 2022-04-10 DIAGNOSIS — I671 Cerebral aneurysm, nonruptured: Secondary | ICD-10-CM

## 2022-04-10 LAB — CBC
HCT: 37.7 % (ref 36.0–46.0)
Hemoglobin: 12.5 g/dL (ref 12.0–15.0)
MCH: 30.8 pg (ref 26.0–34.0)
MCHC: 33.2 g/dL (ref 30.0–36.0)
MCV: 92.9 fL (ref 80.0–100.0)
Platelets: 201 10*3/uL (ref 150–400)
RBC: 4.06 MIL/uL (ref 3.87–5.11)
RDW: 14.1 % (ref 11.5–15.5)
WBC: 5.4 10*3/uL (ref 4.0–10.5)
nRBC: 0 % (ref 0.0–0.2)

## 2022-04-10 LAB — DIFFERENTIAL
Abs Immature Granulocytes: 0.01 10*3/uL (ref 0.00–0.07)
Basophils Absolute: 0.1 10*3/uL (ref 0.0–0.1)
Basophils Relative: 1 %
Eosinophils Absolute: 0.6 10*3/uL — ABNORMAL HIGH (ref 0.0–0.5)
Eosinophils Relative: 12 %
Immature Granulocytes: 0 %
Lymphocytes Relative: 18 %
Lymphs Abs: 1 10*3/uL (ref 0.7–4.0)
Monocytes Absolute: 1 10*3/uL (ref 0.1–1.0)
Monocytes Relative: 18 %
Neutro Abs: 2.8 10*3/uL (ref 1.7–7.7)
Neutrophils Relative %: 51 %

## 2022-04-10 LAB — I-STAT CHEM 8, ED
BUN: 23 mg/dL (ref 8–23)
Calcium, Ion: 1.13 mmol/L — ABNORMAL LOW (ref 1.15–1.40)
Chloride: 107 mmol/L (ref 98–111)
Creatinine, Ser: 0.6 mg/dL (ref 0.44–1.00)
Glucose, Bld: 104 mg/dL — ABNORMAL HIGH (ref 70–99)
HCT: 38 % (ref 36.0–46.0)
Hemoglobin: 12.9 g/dL (ref 12.0–15.0)
Potassium: 3.7 mmol/L (ref 3.5–5.1)
Sodium: 140 mmol/L (ref 135–145)
TCO2: 24 mmol/L (ref 22–32)

## 2022-04-10 LAB — APTT: aPTT: 27 seconds (ref 24–36)

## 2022-04-10 LAB — PROTIME-INR
INR: 1.1 (ref 0.8–1.2)
Prothrombin Time: 14.5 seconds (ref 11.4–15.2)

## 2022-04-10 NOTE — ED Provider Notes (Signed)
  Provider Note MRN:  [id number]  Arrival date & time: 04/11/22    ED Course and Medical Decision Making  Assumed care from Dr. Sherry Ruffing at shift change.  Recent SAH here with acute headache, known large aneuysm awaiting CTA.  3 AM update: CTA imaging does not show any acute bleeding, does show the known large aneurysm.  No other significant findings.  Per chart review, she was evaluated by neurosurgery and was deemed to not be a surgical candidate.  She has palliative care follow-up established.  We discussed the difficulty of her situation having a life-threatening condition but not having intervention available to her.  She is feeling better at this time, no indication for further testing or admission, appropriate for discharge.  Procedures  Final Clinical Impressions(s) / ED Diagnoses     ICD-10-CM   1. Acute nonintractable headache, unspecified headache type  R51.9     2. Brain aneurysm  I67.1       ED Discharge Orders     None         Discharge Instructions      You were evaluated in the Emergency Department and after careful evaluation, we did not find any emergent condition requiring admission or further testing in the hospital.  Your exam/testing today was overall reassuring.  Recommend follow-up with your regular doctor to discuss your symptoms.  Please return to the Emergency Department if you experience any worsening of your condition.  Thank you for allowing [place] to be a part of your care.       Barth Kirks. Sedonia Small, [place] [email or web address]    Maudie Flakes, MD 04/11/22 [phone]

## 2022-04-10 NOTE — ED Provider Notes (Signed)
[facility] EMERGENCY DEPARTMENT Provider Note   CSN: [id number] Arrival date & time: 04/10/22  2215     History  Chief Complaint  Patient presents with   Near Syncope   Hypertension    Cassandra Sims is a 87 y.o. female who presents with concern for sudden onset severe posterior occipital headache around 830 this evening at which time her blood pressure was noted to be greater than 884 systolic at home.  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  No neurologic symptoms to report at this time, headache somewhat improved though still present per patient.  Level 5 caveat due to acute presentation on arrival.  Reviewed her medical records.  Has history of hypertension, GERD, prolonged QT, peripheral vascular disease, glaucoma, and the above listed history. She has not anticoagulated.  HPI     Home Medications Prior to Admission medications   Medication Sig Start Date End Date Taking? Authorizing Provider  acetaminophen (TYLENOL) 500 MG tablet Take 500-1,000 mg by mouth every 8 (eight) hours as needed for mild pain.    [provider]  albuterol (VENTOLIN HFA) 108 (90 Base) MCG/ACT inhaler Inhale 2 puffs into the lungs every 4 (four) hours as needed for wheezing. Reported on 10/10/2015 02/01/21   Garnet Sierras, DO  amLODipine (NORVASC) 2.5 MG tablet Take 2.5 mg by mouth 2 (two) times daily. 07/13/20   [provider]  Azelastine HCl (ASTEPRO) 0.15 % SOLN Place 1 spray into the nose at bedtime.    [provider]  Calcium Carbonate Antacid (TUMS PO) Take 1,000 mg by mouth daily.    [provider]  carboxymethylcellulose (REFRESH PLUS) 0.5 % SOLN Place 1 drop into both eyes 5 (five) times daily as needed (for dry eyes).    [provider]  Cholecalciferol 25 MCG (1000 UT) tablet Take 1,000 Units by  mouth daily.    [provider]  cyclobenzaprine (FLEXERIL) 5 MG tablet Take 1 tablet (5 mg total) by mouth 3 (three) times daily as needed for muscle spasms. 03/30/22   Pokhrel, Corrie Mckusick, MD  denosumab (PROLIA) 60 MG/ML SOSY injection Inject 60 mg into the skin every 6 (six) months. 09/13/20 09/03/22  [provider]  dorzolamide (TRUSOPT) 2 % ophthalmic solution Place 1 drop into both eyes 3 (three) times daily. 02/08/22   [provider]  DULERA 100-5 MCG/ACT AERO INHALE 1 PUFF INTO THE LUNGS DAILY. WITH SPACER AND RINSE MOUTH AFTERWARDS. Patient taking differently: Inhale 1 puff into the lungs daily. 01/17/22   Garnet Sierras, DO  escitalopram (LEXAPRO) 5 MG tablet Take 2.5 mg by mouth at bedtime. 10/17/20   [provider]  labetalol (NORMODYNE) 100 MG tablet Take 1 tablet by mouth 2 (two) times daily. 01/12/20   [provider]  Lactobacillus (ACIDOPHILUS PO) Take 1 capsule by mouth daily.    [provider]  LUTEIN PO Take 20 mg by mouth daily.    [provider]  Multiple Vitamin (MULTIVITAMIN WITH MINERALS) TABS tablet Take 1 tablet by mouth daily. Nature Made Multi Vitamin for Her 50+ W/No Iron    [provider]  olmesartan (BENICAR) 40 MG tablet Take 40 mg by mouth every evening. 04/25/20   [provider]  psyllium (METAMUCIL) 58.6 % powder Take 1 packet by mouth daily. 14.3 g (1 heaping tablespoon) every morning.  [provider]  ROCKLATAN 0.02-0.005 % SOLN Place 1 drop into both eyes at bedtime. 03/08/22   [provider]  traMADol (ULTRAM) 50 MG tablet Take 0.5 tablets (25 mg total) by mouth every 8 (eight) hours as needed for severe pain. 03/30/22   Pokhrel, Corrie Mckusick, MD  Turmeric 450 MG CAPS Take 450 mg by mouth daily with lunch.     [provider]      Allergies    Penicillins, Zantac [ranitidine], Apraclonidine, Brinzolamide, Doxycycline, Lactose, Adhesive [tape],  Clindamycin/lincomycin, Codeine, Darvocet [propoxyphene n-acetaminophen], Dilaudid [hydromorphone hcl], Flagyl [metronidazole], Lactose intolerance (gi), Levofloxacin, Macrodantin [nitrofurantoin macrocrystal], Morphine and related, Other, Pedi-pre tape spray [wound dressing adhesive], Premarin [conjugated estrogens], Tramadol hcl, Vancomycin, Zofran [ondansetron hcl], Clarithromycin, Denosumab, and Sulfa antibiotics    Review of Systems   Review of Systems  Neurological:  Positive for headaches.  All other systems reviewed and are negative.   Physical Exam Updated Vital Signs BP (!) 155/84   Pulse 68   Temp 98 F (36.7 C) (Oral)   Resp (!) 26   SpO2 94%  Physical Exam Vitals and nursing note reviewed.  Constitutional:      Appearance: She is not ill-appearing or toxic-appearing.  HENT:     Head: Normocephalic and atraumatic.     Mouth/Throat:     Mouth: Mucous membranes are moist.     Pharynx: No oropharyngeal exudate or posterior oropharyngeal erythema.  Eyes:     General: Lids are normal. Vision grossly intact.        Right eye: No discharge.        Left eye: No discharge.     Extraocular Movements: Extraocular movements intact.     Conjunctiva/sclera: Conjunctivae normal.     Pupils: Pupils are equal, round, and reactive to light.  Neck:     Trachea: Trachea and phonation normal.  Cardiovascular:     Rate and Rhythm: Normal rate and regular rhythm.     Pulses: Normal pulses.     Heart sounds: Murmur heard.  Pulmonary:     Effort: Pulmonary effort is normal. No tachypnea, bradypnea, accessory muscle usage, prolonged expiration or respiratory distress.     Breath sounds: Normal breath sounds. No wheezing or rales.  Chest:     Chest wall: No mass, lacerations, deformity, swelling, tenderness or crepitus.  Abdominal:     General: Bowel sounds are normal. There is no distension.     Palpations: Abdomen is soft.     Tenderness: There is no abdominal tenderness. There is no  right CVA tenderness, left CVA tenderness, guarding or rebound.  Musculoskeletal:        General: No deformity.     Cervical back: Normal range of motion and neck supple.     Right lower leg: No edema.     Left lower leg: No edema.  Lymphadenopathy:     Cervical: No cervical adenopathy.  Skin:    General: Skin is warm and dry.     Capillary Refill: Capillary refill takes less than 2 seconds.  Neurological:     General: No focal deficit present.     Mental Status: She is alert and oriented to person, place, and time. Mental status is at baseline.     GCS: GCS eye subscore is 4. GCS verbal subscore is 5. GCS motor subscore is 6.     Cranial Nerves: Cranial nerves 2-12 are intact.     Sensory: Sensation is intact.     Motor: Motor  function is intact.     Coordination: Coordination is intact.  Psychiatric:        Mood and Affect: Mood normal.     ED Results / Procedures / Treatments   Labs (all labs ordered are listed, but only abnormal results are displayed) Labs Reviewed  DIFFERENTIAL - Abnormal; Notable for the following components:      Result Value   Eosinophils Absolute 0.6 (*)    All other components within normal limits  COMPREHENSIVE METABOLIC PANEL - Abnormal; Notable for the following components:   Glucose, Bld 110 (*)    Total Protein 6.4 (*)    All other components within normal limits  I-STAT CHEM 8, ED - Abnormal; Notable for the following components:   Glucose, Bld 104 (*)    Calcium, Ion 1.13 (*)    All other components within normal limits  RESP PANEL BY RT-PCR (FLU A&B, COVID) ARPGX2  ETHANOL  PROTIME-INR  APTT  CBC  RAPID URINE DRUG SCREEN, HOSP PERFORMED  URINALYSIS, ROUTINE W REFLEX MICROSCOPIC    EKG None  Radiology CT HEAD WO CONTRAST (5MM)  Result Date: 04/10/2022 CLINICAL DATA:  Headache, sudden, severe recent subarachnoid hemorrhage EXAM: CT HEAD WITHOUT CONTRAST TECHNIQUE: Contiguous axial images were obtained from the base of the skull  through the vertex without intravenous contrast. RADIATION DOSE REDUCTION: This exam was performed according to the departmental dose-optimization program which includes automated exposure control, adjustment of the mA and/or kV according to patient size and/or use of iterative reconstruction technique. COMPARISON:  Head CT 03/27/2022 FINDINGS: Brain: The previous trace subarachnoid hemorrhage in the left central sulcus has diminished from prior exam, minimal faint residual series 3, image 22. No progressive or new hemorrhage. No subdural collection. Stable degree of atrophy and chronic small vessel ischemia. No infarct. No hydrocephalus. Vascular: Large basilar tip aneurysm measuring 1.8 cm AP, series 3, image 9, unchanged allowing for same caliper placement. No adjacent inflammation or stranding. Skull base atherosclerosis. No hyperdense vessel. Skull: Bony under mineralization.  No fracture or acute findings. Sinuses/Orbits: Postsurgical change of the right globe. Postsurgical change of the paranasal sinuses. Unchanged mucosal thickening. Fluid level in the included left maxillary sinus, unchanged. No mastoid effusion Other: None. IMPRESSION: 1. The previous trace subarachnoid hemorrhage in the left central sulcus has diminished from prior exam, minimal faint residual. No progressive or new hemorrhage. 2. Large basilar tip aneurysm is unchanged allowing for same caliper placement. 3. Stable atrophy and chronic small vessel ischemia. Electronically Signed   By: Keith Rake M.D.   On: 04/10/2022 23:20    Procedures Procedures   Medications Ordered in ED Medications  acetaminophen (TYLENOL) tablet 1,000 mg (has no administration in time range)    ED Course/ Medical Decision Making/ A&P                           Medical Decision Making 68 female with history of subarachnoid hemorrhage 2 weeks ago who presents with concern for severe posterior headache.  Hypertensive on intake to 510 systolic  diastolic 84.  Vital signs otherwise reassuring.  Cardiopulmonary dam unremarkable, no focal deficit on neurologic exam and neurovascular tact her extremities.  Emergent considerations for headache include subarachnoid hemorrhage, meningitis, temporal arteritis, glaucoma, cerebral ischemia, carotid/vertebral dissection, intracranial tumor, Venous sinus thrombosis, carbon monoxide poisoning, acute or chronic subdural hemorrhage.  Other considerations include: Migraine, Cluster headache, Tension headache, Hypertension, Caffeine / alcohol / drug withdrawal, Pseudotumor cerebri, Arteriovenous malformation,  Head injury, Neurocysticercosis, Post-lumbar puncture, Preeclampsia, Cervical arthritis, Refractive error causing strain, Dental abscess, Sinusitis, Otitis media, Temporomandibular joint syndrome, Depression, Somatoform disorder (eg, somatization) Trigeminal neuralgia, Glossopharyngeal neuralgia.   Amount and/or Complexity of Data Reviewed Labs:     Details: CBC leukocytosis or anemia, CMP unremarkable alcohol is normal, coags normal. Radiology:     Details:  CT head visualized with provider negative for acute intracranial bleeding, diminished subarachnoid hemorrhage previously seen in the left central sulcus no new progressive hemorrhage.  Large basilar tip aneurysm unchanged from prior exam.  We will proceed with CTA.     Care of the patient signed oncoming ED provider  Dr. Sedonia Small at time of shift change.  All pertinent HPI and physical exam laboratory findings were discussed with him prior to my pressure.  Patient pending CTs at time of shift change.  Disposition pending completion of work-up and reevaluation by oncoming ED team. Admission considered given recent history, however more information is needed prior to determining most appropriate disposition.  Cassandra Sims  voiced understanding of her medical evaluation and treatment plan. Each of their questions answered to their expressed satisfaction.   This chart was dictated using voice recognition software, Dragon. Despite the best efforts of this provider to proofread and correct errors, errors may still occur which can change documentation meaning.   Final Clinical Impression(s) / ED Diagnoses Final diagnoses:  None    Rx / DC Orders ED Discharge Orders     None         Aura Dials 04/11/22 0018    Tegeler, Gwenyth Allegra, MD 04/15/22 [id number]

## 2022-04-10 NOTE — ED Triage Notes (Signed)
Pt BIB EMS for high BP and a near syncopal event at 8pm tonight. Patient does have a recent hx of an aneurysm 9 that has doubled in size per family report to EMS. Concern for possible stroke). Family took an initial BP of 190/184, upon EMS arrival BP was 176/84.   VSS w/ EMS besides pressure of 168/78.

## 2022-04-10 NOTE — ED Provider Notes (Incomplete)
[facility] EMERGENCY DEPARTMENT Provider Note   CSN: [id number] Arrival date & time: 04/10/22  2215     History {Add pertinent medical, surgical, social history, OB history to HPI:1} Chief Complaint  Patient presents with  . Near Syncope    Cassandra Sims is a 88 y.o. female.  HPI     Home Medications Prior to Admission medications   Medication Sig Start Date End Date Taking? Authorizing Provider  acetaminophen (TYLENOL) 500 MG tablet Take 500-1,000 mg by mouth every 8 (eight) hours as needed for mild pain.    [provider]  albuterol (VENTOLIN HFA) 108 (90 Base) MCG/ACT inhaler Inhale 2 puffs into the lungs every 4 (four) hours as needed for wheezing. Reported on 10/10/2015 02/01/21   Garnet Sierras, DO  amLODipine (NORVASC) 2.5 MG tablet Take 2.5 mg by mouth 2 (two) times daily. 07/13/20   [provider]  Azelastine HCl (ASTEPRO) 0.15 % SOLN Place 1 spray into the nose at bedtime.    [provider]  Calcium Carbonate Antacid (TUMS PO) Take 1,000 mg by mouth daily.    [provider]  carboxymethylcellulose (REFRESH PLUS) 0.5 % SOLN Place 1 drop into both eyes 5 (five) times daily as needed (for dry eyes).    [provider]  Cholecalciferol 25 MCG (1000 UT) tablet Take 1,000 Units by mouth daily.    [provider]  cyclobenzaprine (FLEXERIL) 5 MG tablet Take 1 tablet (5 mg total) by mouth 3 (three) times daily as needed for muscle spasms. 03/30/22   Pokhrel, Corrie Mckusick, MD  denosumab (PROLIA) 60 MG/ML SOSY injection Inject 60 mg into the skin every 6 (six) months. 09/13/20 09/03/22  [provider]  dorzolamide (TRUSOPT) 2 % ophthalmic solution Place 1 drop into both eyes 3 (three) times daily. 02/08/22   [provider]  DULERA 100-5 MCG/ACT AERO INHALE 1 PUFF INTO THE LUNGS DAILY. WITH SPACER AND RINSE MOUTH AFTERWARDS. Patient taking differently: Inhale 1 puff into the lungs daily. 01/17/22    Garnet Sierras, DO  escitalopram (LEXAPRO) 5 MG tablet Take 2.5 mg by mouth at bedtime. 10/17/20   [provider]  labetalol (NORMODYNE) 100 MG tablet Take 1 tablet by mouth 2 (two) times daily. 01/12/20   [provider]  Lactobacillus (ACIDOPHILUS PO) Take 1 capsule by mouth daily.    [provider]  LUTEIN PO Take 20 mg by mouth daily.    [provider]  Multiple Vitamin (MULTIVITAMIN WITH MINERALS) TABS tablet Take 1 tablet by mouth daily. Nature Made Multi Vitamin for Her 50+ W/No Iron    [provider]  olmesartan (BENICAR) 40 MG tablet Take 40 mg by mouth every evening. 04/25/20   [provider]  psyllium (METAMUCIL) 58.6 % powder Take 1 packet by mouth daily. 14.3 g (1 heaping tablespoon) every morning.    [provider]  ROCKLATAN 0.02-0.005 % SOLN Place 1 drop into both eyes at bedtime. 03/08/22   [provider]  traMADol (ULTRAM) 50 MG tablet Take 0.5 tablets (25 mg total) by mouth every 8 (eight) hours as needed for severe pain. 03/30/22   Pokhrel, Corrie Mckusick, MD  Turmeric 450 MG CAPS Take 450 mg by mouth daily with lunch.     [provider]      Allergies    Penicillins, Zantac [ranitidine], Apraclonidine, Brinzolamide, Doxycycline, Lactose, Adhesive [tape], Clindamycin/lincomycin, Codeine, Darvocet [propoxyphene n-acetaminophen], Dilaudid [hydromorphone hcl], Flagyl [metronidazole], Lactose intolerance (gi), Levofloxacin, Macrodantin [  nitrofurantoin macrocrystal], Morphine and related, Other, Pedi-pre tape spray [wound dressing adhesive], Premarin [conjugated estrogens], Tramadol hcl, Vancomycin, Zofran [ondansetron hcl], Clarithromycin, Denosumab, and Sulfa antibiotics    Review of Systems   Review of Systems  Physical Exam Updated Vital Signs There were no vitals taken for this visit. Physical Exam  ED Results / Procedures / Treatments   Labs (all labs ordered are listed, but only abnormal results  are displayed) Labs Reviewed - No data to display  EKG None  Radiology No results found.  Procedures Procedures  {Document cardiac monitor, telemetry assessment procedure when appropriate:1}  Medications Ordered in ED Medications - No data to display  ED Course/ Medical Decision Making/ A&P                           Medical Decision Making  ***  {Document critical care time when appropriate:1} {Document review of labs and clinical decision tools ie heart score, Chads2Vasc2 etc:1}  {Document your independent review of radiology images, and any outside records:1} {Document your discussion with family members, caretakers, and with consultants:1} {Document social determinants of health affecting pt's care:1} {Document your decision making why or why not admission, treatments were needed:1} Final Clinical Impression(s) / ED Diagnoses Final diagnoses:  None    Rx / DC Orders ED Discharge Orders     None

## 2022-04-11 ENCOUNTER — Emergency Department (HOSPITAL_COMMUNITY): Payer: Medicare Other

## 2022-04-11 LAB — COMPREHENSIVE METABOLIC PANEL
ALT: 34 U/L (ref 0–44)
AST: 32 U/L (ref 15–41)
Albumin: 3.8 g/dL (ref 3.5–5.0)
Alkaline Phosphatase: 71 U/L (ref 38–126)
Anion gap: 9 (ref 5–15)
BUN: 21 mg/dL (ref 8–23)
CO2: 23 mmol/L (ref 22–32)
Calcium: 9.1 mg/dL (ref 8.9–10.3)
Chloride: 108 mmol/L (ref 98–111)
Creatinine, Ser: 0.69 mg/dL (ref 0.44–1.00)
GFR, Estimated: >60 mL/min (ref >60)
Glucose, Bld: 110 mg/dL — ABNORMAL HIGH (ref 70–99)
Potassium: 3.8 mmol/L (ref 3.5–5.1)
Sodium: 140 mmol/L (ref 135–145)
Total Bilirubin: 0.4 mg/dL (ref 0.3–1.2)
Total Protein: 6.4 g/dL — ABNORMAL LOW (ref 6.5–8.1)

## 2022-04-11 LAB — RAPID URINE DRUG SCREEN, HOSP PERFORMED
Amphetamines: NOT DETECTED
Barbiturates: NOT DETECTED
Benzodiazepines: NOT DETECTED
Cocaine: NOT DETECTED
Opiates: NOT DETECTED
Tetrahydrocannabinol: NOT DETECTED

## 2022-04-11 LAB — URINALYSIS, ROUTINE W REFLEX MICROSCOPIC
Bilirubin Urine: NEGATIVE
Glucose, UA: NEGATIVE mg/dL
Hgb urine dipstick: NEGATIVE
Ketones, ur: NEGATIVE mg/dL
Leukocytes,Ua: NEGATIVE
Nitrite: NEGATIVE
Protein, ur: NEGATIVE mg/dL
Specific Gravity, Urine: 1.006 (ref 1.005–1.030)
pH: 8 (ref 5.0–8.0)

## 2022-04-11 LAB — RESP PANEL BY RT-PCR (FLU A&B, COVID) ARPGX2
Influenza A by PCR: NEGATIVE
Influenza B by PCR: NEGATIVE
SARS Coronavirus 2 by RT PCR: NEGATIVE

## 2022-04-11 LAB — ETHANOL: Alcohol, Ethyl (B): <10 mg/dL (ref <10)

## 2022-04-11 MED ORDER — ACETAMINOPHEN 500 MG PO TABS
1000.0000 mg | ORAL_TABLET | Freq: Once | ORAL | Status: AC
  Administered 2022-04-11: 1000 mg via ORAL
  Filled 2022-04-11: qty 2

## 2022-04-11 MED ORDER — IOHEXOL 350 MG/ML SOLN
75.0000 mL | Freq: Once | INTRAVENOUS | Status: AC | PRN
  Administered 2022-04-11: 75 mL via INTRAVENOUS

## 2022-04-11 NOTE — ED Notes (Signed)
Discharge instructions reviewed with patient. Patient verbalized understanding of instructions. Follow-up care and medications were reviewed. Patient discharged in wheelchair. VSS upon discharge.

## 2022-04-11 NOTE — Discharge Instructions (Signed)
You were evaluated in the Emergency Department and after careful evaluation, we did not find any emergent condition requiring admission or further testing in the hospital.  Your exam/testing today was overall reassuring.  Recommend follow-up with your regular doctor to discuss your symptoms.  Please return to the Emergency Department if you experience any worsening of your condition.  Thank you for allowing [place] to be a part of your care.

## 2022-05-21 ENCOUNTER — Emergency Department (HOSPITAL_BASED_OUTPATIENT_CLINIC_OR_DEPARTMENT_OTHER): Payer: Medicare Other

## 2022-05-21 ENCOUNTER — Emergency Department (HOSPITAL_BASED_OUTPATIENT_CLINIC_OR_DEPARTMENT_OTHER)
Admission: EM | Admit: 2022-05-21 | Discharge: 2022-05-21 | Disposition: A | Discharge status: Home / Self Care | Payer: Medicare Other | Attending: Emergency Medicine | Admitting: Emergency Medicine

## 2022-05-21 ENCOUNTER — Other Ambulatory Visit: Payer: Self-pay

## 2022-05-21 ENCOUNTER — Encounter (HOSPITAL_BASED_OUTPATIENT_CLINIC_OR_DEPARTMENT_OTHER): Payer: Self-pay | Admitting: Emergency Medicine

## 2022-05-21 DIAGNOSIS — Z20822 Contact with and (suspected) exposure to covid-19: Secondary | ICD-10-CM | POA: Insufficient documentation

## 2022-05-21 DIAGNOSIS — Z79899 Other long term (current) drug therapy: Secondary | ICD-10-CM | POA: Diagnosis not present

## 2022-05-21 DIAGNOSIS — I10 Essential (primary) hypertension: Secondary | ICD-10-CM | POA: Insufficient documentation

## 2022-05-21 DIAGNOSIS — J40 Bronchitis, not specified as acute or chronic: Secondary | ICD-10-CM | POA: Insufficient documentation

## 2022-05-21 DIAGNOSIS — R059 Cough, unspecified: Secondary | ICD-10-CM | POA: Diagnosis present

## 2022-05-21 LAB — CBC WITH DIFFERENTIAL/PLATELET
Abs Immature Granulocytes: 0.02 10*3/uL (ref 0.00–0.07)
Basophils Absolute: 0.1 10*3/uL (ref 0.0–0.1)
Basophils Relative: 1 %
Eosinophils Absolute: 0.7 10*3/uL — ABNORMAL HIGH (ref 0.0–0.5)
Eosinophils Relative: 10 %
HCT: 39.3 % (ref 36.0–46.0)
Hemoglobin: 13.2 g/dL (ref 12.0–15.0)
Immature Granulocytes: 0 %
Lymphocytes Relative: 14 %
Lymphs Abs: 1 10*3/uL (ref 0.7–4.0)
MCH: 31.1 pg (ref 26.0–34.0)
MCHC: 33.6 g/dL (ref 30.0–36.0)
MCV: 92.5 fL (ref 80.0–100.0)
Monocytes Absolute: 1.1 10*3/uL — ABNORMAL HIGH (ref 0.1–1.0)
Monocytes Relative: 15 %
Neutro Abs: 4.5 10*3/uL (ref 1.7–7.7)
Neutrophils Relative %: 60 %
Platelets: 213 10*3/uL (ref 150–400)
RBC: 4.25 MIL/uL (ref 3.87–5.11)
RDW: 14.3 % (ref 11.5–15.5)
WBC: 7.4 10*3/uL (ref 4.0–10.5)
nRBC: 0 % (ref 0.0–0.2)

## 2022-05-21 LAB — RESP PANEL BY RT-PCR (FLU A&B, COVID) ARPGX2
Influenza A by PCR: NEGATIVE
Influenza B by PCR: NEGATIVE
SARS Coronavirus 2 by RT PCR: NEGATIVE

## 2022-05-21 LAB — COMPREHENSIVE METABOLIC PANEL
ALT: 30 U/L (ref 0–44)
AST: 28 U/L (ref 15–41)
Albumin: 4 g/dL (ref 3.5–5.0)
Alkaline Phosphatase: 59 U/L (ref 38–126)
Anion gap: 5 (ref 5–15)
BUN: 30 mg/dL — ABNORMAL HIGH (ref 8–23)
CO2: 25 mmol/L (ref 22–32)
Calcium: 9 mg/dL (ref 8.9–10.3)
Chloride: 107 mmol/L (ref 98–111)
Creatinine, Ser: 0.68 mg/dL (ref 0.44–1.00)
GFR, Estimated: >60 mL/min (ref >60)
Glucose, Bld: 119 mg/dL — ABNORMAL HIGH (ref 70–99)
Potassium: 3.8 mmol/L (ref 3.5–5.1)
Sodium: 137 mmol/L (ref 135–145)
Total Bilirubin: 0.6 mg/dL (ref 0.3–1.2)
Total Protein: 6.9 g/dL (ref 6.5–8.1)

## 2022-05-21 LAB — D-DIMER, QUANTITATIVE: D-Dimer, Quant: 0.36 ug/mL-FEU (ref 0.00–0.50)

## 2022-05-21 LAB — TROPONIN I (HIGH SENSITIVITY): Troponin I (High Sensitivity): 4 ng/L (ref <18)

## 2022-05-21 MED ORDER — FAMOTIDINE 20 MG PO TABS: 20.0000 mg | ORAL_TABLET | Freq: Every day | ORAL | 0 refills | Status: DC

## 2022-05-21 MED ORDER — PREDNISONE 10 MG PO TABS: 30.0000 mg | ORAL_TABLET | Freq: Every day | ORAL | 0 refills | Status: AC

## 2022-05-21 MED ORDER — ACETAMINOPHEN 500 MG PO TABS: 1000.0000 mg | ORAL_TABLET | Freq: Three times a day (TID) | ORAL | 0 refills | Status: AC | PRN

## 2022-05-21 NOTE — Discharge Instructions (Addendum)
Take prednisone as prescribed each morning.  Drink warm tea and honey, Tylenol 1000 mg every 6 hours.  Follow-up with your primary care physician.  Return to the ER for any new or concerning symptoms.

## 2022-05-21 NOTE — ED Provider Notes (Signed)
[facility] EMERGENCY DEPARTMENT Provider Note   CSN: [id number] Arrival date & time: 05/21/22  1550     History  Chief Complaint  Patient presents with   Cough    Breon B Ellender is a 88 y.o. female.   Cough Patient is an 88 year old female with a past medical history significant for HTN, reflux, prediabetes, anxiety, asthma states that she has 2 inhalers, seasonal allergies, peripheral vascular disease--venous specifically--prolonged QT.  She presented to the emergency room today with complaint of cough for nearly 1 month.  She states that 10/4 she began having symptoms of cough congestion fatigue and states that her symptoms continued even after she was given conservative therapy as she was ultimately prescribed azithromycin 10/14 which she completed.  She states that she had no improvement in her symptoms after azithromycin.  She had a chest x-ray completed 10/24 which showed "reticulonodular opacities at the right lung base concerning for infection superimposed on emphysema ".  She was prescribed Levaquin and completed this course 10/30.  She states she took all of the tablets.  She states that she continues to feel fatigued and continues to cough she states states she still has some congestion.  She states that over the past few days she has had some pink sputum in her cough.  She denies any chest pain.  She states that she does not feel particularly short of breath.  No recent surgeries, hospitalization, long travel, hemoptysis, estrogen containing OCP, cancer history.  No unilateral leg swelling.  No history of PE or VTE.      Home Medications Prior to Admission medications   Medication Sig Start Date End Date Taking? Authorizing Provider  predniSONE (DELTASONE) 10 MG tablet Take 3 tablets (30 mg total) by mouth daily with breakfast for 5 days. 05/21/22 05/26/22 Yes Levon Boettcher, Ova Freshwater S, PA  acetaminophen (TYLENOL) 500 MG tablet Take 2 tablets (1,000 mg total) by mouth  every 8 (eight) hours as needed for mild pain. 05/21/22   Tedd Sias, PA  albuterol (VENTOLIN HFA) 108 (90 Base) MCG/ACT inhaler Inhale 2 puffs into the lungs every 4 (four) hours as needed for wheezing. Reported on 10/10/2015 02/01/21   Garnet Sierras, DO  amLODipine (NORVASC) 2.5 MG tablet Take 2.5 mg by mouth 2 (two) times daily. 07/13/20   [provider]  Azelastine HCl (ASTEPRO) 0.15 % SOLN Place 1 spray into the nose at bedtime.    [provider]  Calcium Carbonate Antacid (TUMS PO) Take 1,000 mg by mouth daily.    [provider]  carboxymethylcellulose (REFRESH PLUS) 0.5 % SOLN Place 1 drop into both eyes 5 (five) times daily as needed (for dry eyes).    [provider]  Cholecalciferol 25 MCG (1000 UT) tablet Take 1,000 Units by mouth daily.    [provider]  cyclobenzaprine (FLEXERIL) 5 MG tablet Take 1 tablet (5 mg total) by mouth 3 (three) times daily as needed for muscle spasms. 03/30/22   Pokhrel, Corrie Mckusick, MD  denosumab (PROLIA) 60 MG/ML SOSY injection Inject 60 mg into the skin every 6 (six) months. 09/13/20 09/03/22  [provider]  dorzolamide (TRUSOPT) 2 % ophthalmic solution Place 1 drop into both eyes 3 (three) times daily. 02/08/22   [provider]  DULERA 100-5 MCG/ACT AERO INHALE 1 PUFF INTO THE LUNGS DAILY. WITH SPACER AND RINSE MOUTH AFTERWARDS. Patient taking differently: Inhale 1 puff into the lungs daily. 01/17/22   Garnet Sierras, DO  escitalopram Loma Sousa)  5 MG tablet Take 2.5 mg by mouth at bedtime. 10/17/20   [provider]  labetalol (NORMODYNE) 100 MG tablet Take 1 tablet by mouth 2 (two) times daily. 01/12/20   [provider]  Lactobacillus (ACIDOPHILUS PO) Take 1 capsule by mouth daily.    [provider]  LUTEIN PO Take 20 mg by mouth daily.    [provider]  Multiple Vitamin (MULTIVITAMIN WITH MINERALS) TABS tablet Take 1 tablet by mouth daily. Nature Made Multi  Vitamin for Her 50+ W/No Iron    [provider]  olmesartan (BENICAR) 40 MG tablet Take 40 mg by mouth every evening. 04/25/20   [provider]  psyllium (METAMUCIL) 58.6 % powder Take 1 packet by mouth daily. 14.3 g (1 heaping tablespoon) every morning.    [provider]  ROCKLATAN 0.02-0.005 % SOLN Place 1 drop into both eyes at bedtime. 03/08/22   [provider]  traMADol (ULTRAM) 50 MG tablet Take 0.5 tablets (25 mg total) by mouth every 8 (eight) hours as needed for severe pain. 03/30/22   Pokhrel, Corrie Mckusick, MD  Turmeric 450 MG CAPS Take 450 mg by mouth daily with lunch.     [provider]      Allergies    Penicillins, Zantac [ranitidine], Apraclonidine, Brinzolamide, Doxycycline, Lactose, Adhesive [tape], Clindamycin/lincomycin, Codeine, Darvocet [propoxyphene n-acetaminophen], Dilaudid [hydromorphone hcl], Flagyl [metronidazole], Lactose intolerance (gi), Levofloxacin, Macrodantin [nitrofurantoin macrocrystal], Morphine and related, Other, Pedi-pre tape spray [wound dressing adhesive], Premarin [conjugated estrogens], Tramadol hcl, Vancomycin, Zofran [ondansetron hcl], Clarithromycin, Denosumab, and Sulfa antibiotics    Review of Systems   Review of Systems  Respiratory:  Positive for cough.     Physical Exam Updated Vital Signs BP 137/77 (BP Location: Left Arm)   Pulse 65   Temp 98.5 F (36.9 C) (Oral)   Resp 20   SpO2 97%  Physical Exam Vitals and nursing note reviewed.  Constitutional:      General: She is not in acute distress. HENT:     Head: Normocephalic and atraumatic.     Nose: Nose normal.  Eyes:     General: No scleral icterus. Cardiovascular:     Rate and Rhythm: Normal rate and regular rhythm.     Pulses: Normal pulses.     Heart sounds: Normal heart sounds.  Pulmonary:     Effort: Pulmonary effort is normal. No respiratory distress.     Breath sounds: No wheezing.     Comments: Faint crackle in R  base Abdominal:     Palpations: Abdomen is soft.     Tenderness: There is no abdominal tenderness.  Musculoskeletal:     Cervical back: Normal range of motion.     Right lower leg: No edema.     Left lower leg: No edema.  Skin:    General: Skin is warm and dry.     Capillary Refill: Capillary refill takes less than 2 seconds.  Neurological:     Mental Status: She is alert. Mental status is at baseline.  Psychiatric:        Mood and Affect: Mood normal.        Behavior: Behavior normal.     ED Results / Procedures / Treatments   Labs (all labs ordered are listed, but only abnormal results are displayed) Labs Reviewed  CBC WITH DIFFERENTIAL/PLATELET - Abnormal; Notable for the following components:      Result Value   Monocytes Absolute 1.1 (*)    Eosinophils Absolute 0.7 (*)  All other components within normal limits  COMPREHENSIVE METABOLIC PANEL - Abnormal; Notable for the following components:   Glucose, Bld 119 (*)    BUN 30 (*)    All other components within normal limits  RESP PANEL BY RT-PCR (FLU A&B, COVID) [id number]  D-DIMER, QUANTITATIVE  TROPONIN I (HIGH SENSITIVITY)    EKG EKG Interpretation  Date/Time:  Tuesday May 21 2022 16:14:01 EDT Ventricular Rate:  65 PR Interval:  157 QRS Duration: 99 QT Interval:  397 QTC Calculation: 413 R Axis:   61 Text Interpretation: Sinus rhythm Abnormal R-wave progression, early transition Confirmed by Octaviano Glow [phone]) on 05/21/2022 4:24:39 PM  Radiology CT Chest Wo Contrast  Result Date: 05/21/2022 CLINICAL DATA:  Pneumonia. EXAM: CT CHEST WITHOUT CONTRAST TECHNIQUE: Multidetector CT imaging of the chest was performed following the standard protocol without IV contrast. RADIATION DOSE REDUCTION: This exam was performed according to the departmental dose-optimization program which includes automated exposure control, adjustment of the mA and/or kV according to patient size and/or use of iterative reconstruction  technique. COMPARISON:  CT chest 09/22/2020 FINDINGS: Cardiovascular: Heart is mildly enlarged. Aorta is normal in size. There are atherosclerotic calcifications of the aorta. There is no pericardial effusion. Mediastinum/Nodes: There are dense calcifications in the right thyroid, unchanged. There are no enlarged mediastinal or hilar lymph nodes. The esophagus is nondilated. Lungs/Pleura: Calcified granulomas in the left lung appear unchanged. There is stable elevation of the left hemidiaphragm. There is some linear atelectasis in both lung bases. There is no focal lung consolidation, pleural effusion or pneumothorax. Trachea and central airways are patent. Upper Abdomen: Left hepatic cyst present.  No acute findings. Musculoskeletal: No chest wall mass or suspicious bone lesions identified. IMPRESSION: 1. No acute cardiopulmonary process. 2. Stable elevation of the left hemidiaphragm. 3. Mild cardiomegaly. Aortic Atherosclerosis (ICD10-I70.0). Electronically Signed   By: Ronney Asters M.D.   On: 05/21/2022 17:23    Procedures Procedures    Medications Ordered in ED Medications - No data to display  ED Course/ Medical Decision Making/ A&P                           Medical Decision Making Amount and/or Complexity of Data Reviewed Labs: ordered. Radiology: ordered.   This patient presents to the ED for concern of cough, this involves a number of treatment options, and is a complaint that carries with it a moderate risk of complications and morbidity. A differential diagnosis was considered for the patient's symptoms which is discussed below:   Differential diagnosis for emergent cause of cough includes but is not limited to upper respiratory infection, lower respiratory infection, allergies, asthma, irritants, foreign body, medications such as ACE inhibitors, reflux, asthma, CHF, lung cancer, interstitial lung disease, psychiatric causes, postnasal drip and postinfectious bronchospasm.    Co  morbidities: Discussed in HPI   Brief History:  Patient is an 88 year old female with a past medical history significant for HTN, reflux, prediabetes, anxiety, asthma states that she has 2 inhalers, seasonal allergies, peripheral vascular disease--venous specifically--prolonged QT.  She presented to the emergency room today with complaint of cough for nearly 1 month.  She states that 10/4 she began having symptoms of cough congestion fatigue and states that her symptoms continued even after she was given conservative therapy as she was ultimately prescribed azithromycin 10/14 which she completed.  She states that she had no improvement in her symptoms after azithromycin.  She had a chest x-ray completed  10/24 which showed "reticulonodular opacities at the right lung base concerning for infection superimposed on emphysema ".  She was prescribed Levaquin and completed this course 10/30.  She states she took all of the tablets.  She states that she continues to feel fatigued and continues to cough she states states she still has some congestion.  She states that over the past few days she has had some pink sputum in her cough.  She denies any chest pain.  She states that she does not feel particularly short of breath.  No recent surgeries, hospitalization, long travel, hemoptysis, estrogen containing OCP, cancer history.  No unilateral leg swelling.  No history of PE or VTE.       EMR reviewed including pt PMHx, past surgical history and past visits to ER.   See HPI for more details   Lab Tests:   I ordered and independently interpreted labs. Labs notable for dimer below cutoff - low suspicion for PE given pt's symptoms. Trop WNLs.  CMP unremarkable. CBC without leukocytosis or anemia.   Imaging Studies:  NAD. I personally reviewed all imaging studies and no acute abnormality found. I agree with radiology interpretation. I personally viewed the CT scan and interpreted.  I agree with  radiology interpretation No PNA  IMPRESSION:  1. No acute cardiopulmonary process.  2. Stable elevation of the left hemidiaphragm.  3. Mild cardiomegaly.    Aortic Atherosclerosis (ICD10-I70.0).      Electronically Signed    By: Ronney Asters M.D.    On: 05/21/2022 17:23    Cardiac Monitoring:  The patient was maintained on a cardiac monitor.  I personally viewed and interpreted the cardiac monitored which showed an underlying rhythm of: NSR EKG non-ischemic   Medicines ordered:    Critical Interventions:     Consults/Attending Physician   I discussed this case with my attending physician who cosigned this note including patient's presenting symptoms, physical exam, and planned diagnostics and interventions. Attending physician stated agreement with plan or made changes to plan which were implemented.   Reevaluation:  After the interventions noted above I re-evaluated patient and found that they have :stayed the same   Social Determinants of Health:      Problem List / ED Course:  Cough for greater than 3 weeks.  At this point CT scan was found to infiltrate most likely bronchitis.  Low-dose prednisone prescribed and recommend that she follow-up with PCP.  Return precautions discussed   Dispostion:  After consideration of the diagnostic results and the patients response to treatment, I feel that the patent would benefit from outpatient follow-up.    Final Clinical Impression(s) / ED Diagnoses Final diagnoses:  Bronchitis    Rx / DC Orders ED Discharge Orders          Ordered    acetaminophen (TYLENOL) 500 MG tablet  Every 8 hours PRN        05/21/22 1801    predniSONE (DELTASONE) 10 MG tablet  Daily with breakfast        05/21/22 1801    famotidine (PEPCID) 20 MG tablet  Daily,   Status:  Discontinued        05/21/22 Rosa, Sukaina Toothaker S, [place] 05/21/22 1805    Wyvonnia Dusky, MD 05/21/22 1824

## 2022-05-21 NOTE — ED Triage Notes (Signed)
Pt present from PCP and sent here for evaluation for possible PNA. Unresolved cough, congestion, fatigue after two rounds of abx. "Reticulonodular opacities at the right lung base" on recent CXR. Sx started around 10/4.

## 2022-05-27 ENCOUNTER — Encounter: Payer: Self-pay | Admitting: Pulmonary Disease

## 2022-05-27 ENCOUNTER — Ambulatory Visit: Payer: Medicare Other | Admitting: Pulmonary Disease

## 2022-05-27 ENCOUNTER — Telehealth: Payer: Self-pay

## 2022-05-27 VITALS — BP 130/70 | HR 61 | Ht 62.5 in | Wt 107.2 lb

## 2022-05-27 DIAGNOSIS — J4550 Severe persistent asthma, uncomplicated: Secondary | ICD-10-CM

## 2022-05-27 DIAGNOSIS — K219 Gastro-esophageal reflux disease without esophagitis: Secondary | ICD-10-CM

## 2022-05-27 LAB — CBC WITH DIFFERENTIAL/PLATELET
Basophils Absolute: 0.1 10*3/uL (ref 0.0–0.1)
Basophils Relative: 0.8 % (ref 0.0–3.0)
Eosinophils Absolute: 0.3 10*3/uL (ref 0.0–0.7)
Eosinophils Relative: 3.9 % (ref 0.0–5.0)
HCT: 36.4 % (ref 36.0–46.0)
Hemoglobin: 12.3 g/dL (ref 12.0–15.0)
Lymphocytes Relative: 25.1 % (ref 12.0–46.0)
Lymphs Abs: 1.7 10*3/uL (ref 0.7–4.0)
MCHC: 33.7 g/dL (ref 30.0–36.0)
MCV: 92.4 fl (ref 78.0–100.0)
Monocytes Absolute: 1.2 10*3/uL — ABNORMAL HIGH (ref 0.1–1.0)
Monocytes Relative: 17.2 % — ABNORMAL HIGH (ref 3.0–12.0)
Neutro Abs: 3.6 10*3/uL (ref 1.4–7.7)
Neutrophils Relative %: 53 % (ref 43.0–77.0)
Platelets: 209 10*3/uL (ref 150.0–400.0)
RBC: 3.94 Mil/uL (ref 3.87–5.11)
RDW: 14.7 % (ref 11.5–15.5)
WBC: 6.7 10*3/uL (ref 4.0–10.5)

## 2022-05-27 NOTE — Patient Instructions (Signed)
Severe persistent asthma: Restart Dupixent immediately Continue Dulera 200 2 puffs twice a day no matter how you feel Use albuterol as needed for chest tightness wheezing or shortness of breath We will check a serum CBC and IgE today  Gastroesophageal reflux disease: Continue Zantac as you are doing Elevate the head of your bed Avoid fatty foods, alcohol, chocolate, caffeine and tobacco products No eating within 3 hours of bedtime   We will see you back in early January or sooner if needed

## 2022-05-27 NOTE — Telephone Encounter (Signed)
Severe persistent asthma: Restart Dupixent immediately Continue Dulera 200 2 puffs twice a day no matter how you feel Use albuterol as needed for chest tightness wheezing or shortness of breath

## 2022-05-27 NOTE — Progress Notes (Unsigned)
Synopsis: First seen by Dr. Tera Partridge at [facility] for Asthma, Left hemidiaphram paralysis, GERD, chronic rhinitis and a pulmonary nodule.  She has glaucoma.  Subjective:   PATIENT ID: Cassandra Sims GENDER: female DOB: 1933/06/21, MRN: [id number]   HPI  Chief Complaint  Patient presents with   Consult   Wm says she has been on an antibiotic for a month or more.  She was told she had bronchitis, pneumonia.  There was a question of a "mass on the lungs".  She just recently completed a course of prednisone.  She has been told that she had acid reflux.  About 6 weeks ago she started having trouble with: > cough, and chest congestion > she keeps sinus congestion > she developed wheezing form time to time > she had some dyspnea when she lay flat, feels better when sitting up > she doesn't think the antibiotics helped > her daughter says that she is not wheezing or seems as dyspneic again > no fever with any of this > some chest pain > summer and spring this year were OK > she was on Dupixent, stopped it in May or June > stopped taking an allergy pill to help with her eyes  She is currently taking Dulera, still needs Albuterol:   She thinks that taking Zantac helps with burping, coughing.    Record review: 03/2020 hospital discharge summary reviewed where the patient was treated for a subarachnoid hemorrhage   Past Medical History:  Diagnosis Date   Anxiety    Arthritis    Asthma    Complication of anesthesia    hard to wake up, 2009  sit up in bed to fast and BP dropped low code blue called per pt.   Family history of adverse reaction to anesthesia    sister has naseau   GERD (gastroesophageal reflux disease)    Glaucoma    Headache    hx of migrains   HOH (hard of hearing)    Hypertension    Hypoglycemia    Multiple allergies    Peripheral vascular disease (HCC)    varicose veins , Laser vein surgery   PONV (postoperative nausea and vomiting)     Pre-diabetes    Prolonged QT interval 01/13/2018   Rhinitis    S/P knee replacement 01/13/2018   Wears glasses      Family History  Problem Relation Age of Onset   Glaucoma Mother    Arthritis Mother    Lung disease Sister    Breast cancer Daughter    Diabetes Mellitus I Other    Leukemia Brother    Allergic rhinitis Neg Hx    Angioedema Neg Hx    Asthma Neg Hx    Atopy Neg Hx    Eczema Neg Hx    Urticaria Neg Hx    Immunodeficiency Neg Hx      Social History   Socioeconomic History   Marital status: Divorced    Spouse name: Not on file   Number of children: 4   Years of education: Not on file   Highest education level: Not on file  Occupational History   Not on file  Tobacco Use   Smoking status: Never   Smokeless tobacco: Never  Vaping Use   Vaping Use: Never used  Substance and Sexual Activity   Alcohol use: No   Drug use: No   Sexual activity: Not on file  Other Topics Concern   Not on file  Social History Narrative   Originally from [place]. Previously lived in [place] for 14 months. Has worked in Science writer with [employer]   Social Determinants of Radio broadcast assistant Strain: Not on file  Food Insecurity: Not on file  Transportation Needs: Not on file  Physical Activity: Not on file  Stress: Not on file  Social Connections: Not on file  Intimate Partner Violence: Not on file     Allergies  Allergen Reactions   Penicillins Shortness Of Breath and Swelling    Has patient had a PCN reaction causing immediate rash, facial/tongue/throat swelling, SOB or lightheadedness with hypotension: Yes Has patient had a PCN reaction causing severe rash involving mucus membranes or skin necrosis: No Has patient had a PCN reaction that required hospitalization: No Has patient had a PCN reaction occurring within the last 10 years: No If all of the above answers are "NO", then may proceed with Cephalosporin use.    Zantac [Ranitidine] Itching   Apraclonidine Other (See  Comments)    Very light sensitive, swollen eye   Brinzolamide Other (See Comments)   Doxycycline Other (See Comments)    Pt states she tolerates OKAY   Lactose Other (See Comments)   Adhesive [Tape] Itching    BAND-AID TAPE-skin redness/itching   Clindamycin/Lincomycin Other (See Comments)    Severe joint pain.   Codeine Nausea And Vomiting   Darvocet [Propoxyphene N-Acetaminophen] Other (See Comments)    HTN   Dilaudid [Hydromorphone Hcl] Nausea And Vomiting    DROP BLOOD PRESSURE HYPOTENSIVE   Flagyl [Metronidazole] Other (See Comments)    Joint pains   Lactose Intolerance (Gi)    Levofloxacin     Joint pain   Macrodantin [Nitrofurantoin Macrocrystal]    Morphine And Related     hallucinate   Other    Pedi-Pre Tape Spray [Wound Dressing Adhesive]    Premarin [Conjugated Estrogens] Itching   Tramadol Hcl Other (See Comments)    Causes insomnia   Vancomycin Hives and Itching   Zofran [Ondansetron Hcl]     HTN   Clarithromycin     Other reaction(s): Abdominal Pain Anxious/nervous feeling   Denosumab Rash   Sulfa Antibiotics Rash     Outpatient Medications Prior to Visit  Medication Sig Dispense Refill   albuterol (VENTOLIN HFA) 108 (90 Base) MCG/ACT inhaler Inhale 2 puffs into the lungs every 4 (four) hours as needed for wheezing. Reported on 10/10/2015 18 g 1   DULERA 100-5 MCG/ACT AERO INHALE 1 PUFF INTO THE LUNGS DAILY. WITH SPACER AND RINSE MOUTH AFTERWARDS. (Patient taking differently: Inhale 1 puff into the lungs daily.) 13 each 5   acetaminophen (TYLENOL) 500 MG tablet Take 2 tablets (1,000 mg total) by mouth every 8 (eight) hours as needed for mild pain. 30 tablet 0   amLODipine (NORVASC) 2.5 MG tablet Take 2.5 mg by mouth 2 (two) times daily.     Azelastine HCl (ASTEPRO) 0.15 % SOLN Place 1 spray into the nose at bedtime.     Calcium Carbonate Antacid (TUMS PO) Take 1,000 mg by mouth daily.     carboxymethylcellulose (REFRESH PLUS) 0.5 % SOLN Place 1 drop into  both eyes 5 (five) times daily as needed (for dry eyes).     Cholecalciferol 25 MCG (1000 UT) tablet Take 1,000 Units by mouth daily.     cyclobenzaprine (FLEXERIL) 5 MG tablet Take 1 tablet (5 mg total) by mouth 3 (three) times daily as needed for muscle spasms. 30 tablet 0  denosumab (PROLIA) 60 MG/ML SOSY injection Inject 60 mg into the skin every 6 (six) months.     dorzolamide (TRUSOPT) 2 % ophthalmic solution Place 1 drop into both eyes 3 (three) times daily.     escitalopram (LEXAPRO) 5 MG tablet Take 2.5 mg by mouth at bedtime.     labetalol (NORMODYNE) 100 MG tablet Take 1 tablet by mouth 2 (two) times daily.     Lactobacillus (ACIDOPHILUS PO) Take 1 capsule by mouth daily.     LUTEIN PO Take 20 mg by mouth daily.     Multiple Vitamin (MULTIVITAMIN WITH MINERALS) TABS tablet Take 1 tablet by mouth daily. Nature Made Multi Vitamin for Her 50+ W/No Iron     olmesartan (BENICAR) 40 MG tablet Take 40 mg by mouth every evening.     psyllium (METAMUCIL) 58.6 % powder Take 1 packet by mouth daily. 14.3 g (1 heaping tablespoon) every morning.     ROCKLATAN 0.02-0.005 % SOLN Place 1 drop into both eyes at bedtime.     traMADol (ULTRAM) 50 MG tablet Take 0.5 tablets (25 mg total) by mouth every 8 (eight) hours as needed for severe pain. 20 tablet 0   Turmeric 450 MG CAPS Take 450 mg by mouth daily with lunch.      Facility-Administered Medications Prior to Visit  Medication Dose Route Frequency Provider Last Rate Last Admin   dupilumab (DUPIXENT) prefilled syringe 300 mg  300 mg Subcutaneous Q14 Days Garnet Sierras, DO   300 mg at 10/11/21 1048     Review of Systems  Constitutional:  Negative for chills, fever, malaise/fatigue and weight loss.  HENT:  Negative for congestion, nosebleeds, sinus pain and sore throat.   Eyes:  Negative for photophobia, pain and discharge.  Respiratory:  Positive for cough, shortness of breath and wheezing. Negative for hemoptysis and sputum production.    Cardiovascular:  Negative for chest pain, palpitations, orthopnea and leg swelling.  Gastrointestinal:  Negative for abdominal pain, constipation, diarrhea, nausea and vomiting.  Genitourinary:  Negative for dysuria, frequency, hematuria and urgency.  Musculoskeletal:  Negative for back pain, joint pain, myalgias and neck pain.  Skin:  Negative for itching and rash.  Neurological:  Negative for tingling, tremors, sensory change, speech change, focal weakness, seizures, weakness and headaches.  Psychiatric/Behavioral:  Negative for memory loss, substance abuse and suicidal ideas. The patient is not nervous/anxious.       Objective:  Physical Exam   Vitals:   05/27/22 0846  BP: 130/70  Pulse: 61  SpO2: 98%  Weight: 107 lb 3.2 oz (48.6 kg)  Height: 5' 2.5" (1.588 m)   RA  Gen: elderly, chronically ill appearing HENT: OP clear, TM's clear, neck supple PULM: CTA B, normal percussion CV: RRR, no mgr, trace edema GI: BS+, soft, nontender Derm: no cyanosis or rash Psyche: normal mood and affect   CBC    Component Value Date/Time   WBC 7.4 05/21/2022 1625   RBC 4.25 05/21/2022 1625   HGB 13.2 05/21/2022 1625   HGB 13.6 08/26/2017 1108   HCT 39.3 05/21/2022 1625   HCT 40.8 08/26/2017 1108   PLT 213 05/21/2022 1625   PLT 217 08/26/2017 1108   MCV 92.5 05/21/2022 1625   MCV 92 08/26/2017 1108   MCH 31.1 05/21/2022 1625   MCHC 33.6 05/21/2022 1625   RDW 14.3 05/21/2022 1625   RDW 14.0 08/26/2017 1108   LYMPHSABS 1.0 05/21/2022 1625   LYMPHSABS 1.6 08/26/2017 1108   MONOABS  1.1 (H) 05/21/2022 1625   EOSABS 0.7 (H) 05/21/2022 1625   EOSABS 0.4 08/26/2017 1108   BASOSABS 0.1 05/21/2022 1625   BASOSABS 0.1 08/26/2017 1108    PFT 08/2017 [place]: FEV 1 1.28L 06/06/16: FVC 2.16 L (88%) FEV1 1.52 L (84%) FEV1/FVC 0.70 FEF 25-75 0.97 L (75%) negative bronchodilator response TLC 4.43 L (90%) RV 93% ERV 58% DLCO corrected 75% (Hgb 15.1)  08/04/12: FVC 2.81 L (110%) FEV1 1.87 L  (107%) FEV1/FVC 0.67 FEF 25-75 0.16 L (57%) negative bronchodilator response TLC 4.33 L (95%) RV 79% ERV 87% DLCO uncorrected 93%  IMAGING CXR 08/2017 images independently reviewed showing an elevated left hemidiaphragm.  CT CHEST W/O 12/17/16: Left lower lobe 6 mm nodule unchanged compared to prior  CT CHEST W/ CONTRAST 06/19/16:  Mild centrilobular emphysema. 6 mm nodule along the minor fissure likely subpleural lymph node. Calcified lung nodules also noted. No pleural effusion or thickening. No pericardial effusion. No pathologic mediastinal adenopathy. Low attenuation lesions in the liver measuring up to 2.3 cm likely cysts.  October 2023 CT chest images independently reviewed showing evidence of left hemidiaphragm paralysis with elevation of the left hemidiaphragm.,  Small areas of bronchiectasis in the left base noted, chronic airway thickening.   BARIUM SWALLOW/ESOPHAGRAM 04/05/16 (per radiologist):  Moderate gastroesophageal reflux. No esophageal dysmotility, stricture or mass.  SNIFF TEST 04/05/16 (per radiologist):  Mild paradoxical movement of the diaphragms with forceful inhalation is consistent with paralysis of the LEFT hemidiaphragm. Chronic elevation of LEFT diaphragm.   LABS 05/27/17 ANA:  Negative   03/29/16 CBC:  8.6/13.8/40.6/232 Eos:  0.6 IgE:  14 RAST Panel:  Negative          Assessment & Plan:   Severe persistent asthma, unspecified whether complicated - Plan: CBC w/Diff, IgE  Gastroesophageal reflux disease, unspecified whether esophagitis present  Discussion: Cassandra Sims has had a severe exacerbation of her severe persistent asthma in the last several months and now seems to be doing better after starting prednisone.  Unfortunately, she is going to need to take a LABA/ICS inhaler for the next few weeks to keep recurrence from happening.  This puts her at very high risk of worsening glaucoma as she has severe problems in that regard.  She does best when she is  maintained on some sort of biologic for asthma.  She did great with Berna Bue, apparently Dupixent also worked well.  We need to restart that as soon as possible.  Plan: Severe persistent asthma: Restart Dupixent immediately Continue Dulera 200 2 puffs twice a day no matter how you feel Use albuterol as needed for chest tightness wheezing or shortness of breath We will check a serum CBC and IgE today  Gastroesophageal reflux disease: Continue Zantac as you are doing Elevate the head of your bed Avoid fatty foods, alcohol, chocolate, caffeine and tobacco products No eating within 3 hours of bedtime   We will see you back in early January or sooner if needed  Immunization History  Administered Date(s) Administered   19-influenza Whole 04/10/2017   Influenza Split 04/21/2013   Influenza Whole 04/14/2012   Influenza, High Dose Seasonal PF 03/31/2018   Influenza,inj,Quad PF,6+ Mos 04/30/2013, 04/24/2015, 05/29/2015, 05/22/2016   Influenza,inj,quad, With Preservative 04/22/2014   Influenza-Unspecified 05/10/2021   PFIZER(Purple Top)SARS-COV-2 Vaccination 08/06/2019, 08/27/2019, 05/31/2020   Pfizer Covid-19 Vaccine Bivalent Booster 27yr & up 12/26/2020, 04/17/2021   Pneumococcal Conjugate,unspecified 07/22/2010   Pneumococcal Conjugate-13 12/10/2008, 07/22/2010, 07/22/2010, 05/29/2015   Pneumococcal Polysaccharide-23 12/10/2008, 03/29/2011   Pneumococcal-Unspecified  12/10/2008   Tdap 04/30/2011   Zoster Recombinat (Shingrix) 11/15/2016, 02/04/2017   Zoster, Live 04/30/2011   Zoster, Unspecified 11/15/2016, 02/04/2017      Current Outpatient Medications:    albuterol (VENTOLIN HFA) 108 (90 Base) MCG/ACT inhaler, Inhale 2 puffs into the lungs every 4 (four) hours as needed for wheezing. Reported on 10/10/2015, Disp: 18 g, Rfl: 1   DULERA 100-5 MCG/ACT AERO, INHALE 1 PUFF INTO THE LUNGS DAILY. WITH SPACER AND RINSE MOUTH AFTERWARDS. (Patient taking differently: Inhale 1 puff into the  lungs daily.), Disp: 13 each, Rfl: 5   acetaminophen (TYLENOL) 500 MG tablet, Take 2 tablets (1,000 mg total) by mouth every 8 (eight) hours as needed for mild pain., Disp: 30 tablet, Rfl: 0   amLODipine (NORVASC) 2.5 MG tablet, Take 2.5 mg by mouth 2 (two) times daily., Disp: , Rfl:    Azelastine HCl (ASTEPRO) 0.15 % SOLN, Place 1 spray into the nose at bedtime., Disp: , Rfl:    Calcium Carbonate Antacid (TUMS PO), Take 1,000 mg by mouth daily., Disp: , Rfl:    carboxymethylcellulose (REFRESH PLUS) 0.5 % SOLN, Place 1 drop into both eyes 5 (five) times daily as needed (for dry eyes)., Disp: , Rfl:    Cholecalciferol 25 MCG (1000 UT) tablet, Take 1,000 Units by mouth daily., Disp: , Rfl:    cyclobenzaprine (FLEXERIL) 5 MG tablet, Take 1 tablet (5 mg total) by mouth 3 (three) times daily as needed for muscle spasms., Disp: 30 tablet, Rfl: 0   denosumab (PROLIA) 60 MG/ML SOSY injection, Inject 60 mg into the skin every 6 (six) months., Disp: , Rfl:    dorzolamide (TRUSOPT) 2 % ophthalmic solution, Place 1 drop into both eyes 3 (three) times daily., Disp: , Rfl:    escitalopram (LEXAPRO) 5 MG tablet, Take 2.5 mg by mouth at bedtime., Disp: , Rfl:    labetalol (NORMODYNE) 100 MG tablet, Take 1 tablet by mouth 2 (two) times daily., Disp: , Rfl:    Lactobacillus (ACIDOPHILUS PO), Take 1 capsule by mouth daily., Disp: , Rfl:    LUTEIN PO, Take 20 mg by mouth daily., Disp: , Rfl:    Multiple Vitamin (MULTIVITAMIN WITH MINERALS) TABS tablet, Take 1 tablet by mouth daily. Nature Made Multi Vitamin for Her 50+ W/No Iron, Disp: , Rfl:    olmesartan (BENICAR) 40 MG tablet, Take 40 mg by mouth every evening., Disp: , Rfl:    psyllium (METAMUCIL) 58.6 % powder, Take 1 packet by mouth daily. 14.3 g (1 heaping tablespoon) every morning., Disp: , Rfl:    ROCKLATAN 0.02-0.005 % SOLN, Place 1 drop into both eyes at bedtime., Disp: , Rfl:    traMADol (ULTRAM) 50 MG tablet, Take 0.5 tablets (25 mg total) by mouth every  8 (eight) hours as needed for severe pain., Disp: 20 tablet, Rfl: 0   Turmeric 450 MG CAPS, Take 450 mg by mouth daily with lunch. , Disp: , Rfl:   Current Facility-Administered Medications:    dupilumab (DUPIXENT) prefilled syringe 300 mg, 300 mg, Subcutaneous, Q14 Days, Garnet Sierras, DO, 300 mg at 10/11/21 1048

## 2022-05-28 ENCOUNTER — Telehealth: Payer: Self-pay | Admitting: Pharmacist

## 2022-05-28 LAB — IGE: IgE (Immunoglobulin E), Serum: 9 kU/L (ref <=114)

## 2022-05-28 NOTE — Telephone Encounter (Signed)
Received notification that patient is to restart Dupixent.  Submitted a Prior Authorization request to [facility] for [place] via CoverMyMeds. Will update once we receive a response.  Key: Adela Lank, PharmD, MPH, BCPS, CPP Clinical Pharmacist (Rheumatology and Pulmonology)

## 2022-05-28 NOTE — Telephone Encounter (Signed)
Will start [place]. Not sure if paperwork was completed at [place], PharmD, MPH, BCPS, CPP Clinical Pharmacist (Rheumatology and Pulmonology)

## 2022-05-29 ENCOUNTER — Telehealth: Payer: Self-pay | Admitting: Pulmonary Disease

## 2022-05-29 NOTE — Telephone Encounter (Signed)
Called and left voicemail for patient that her lab work that was done 11/6 was normal from Dr Pennie Banter. Advised her that if she had any questions to call office back. Nothing further needed

## 2022-05-31 ENCOUNTER — Other Ambulatory Visit (HOSPITAL_COMMUNITY): Payer: Self-pay

## 2022-05-31 NOTE — Telephone Encounter (Signed)
Received notification from [facility] regarding a prior authorization for [place]. Authorization has been APPROVED from 05/28/22 to 07/22/2023.   Per test claim, copay for 28 days supply is $1703.10 (for loading dose)  Patient can fill through [facility]: [phone]   Authorization #  [id number]  Will need to pursue DMW PAP. Printed with Academic librarian. Copy of PA approval sent to media tab of patient's chart.  Patient states she is approved through DMW PAP through 07/21/22 through her allergist. However is Dr. Lake Bells is taking over Agenda management, then we will ned to complete entire PAP application again per the protocol of the program.  Patient states that now she is enrolled into palliative care, she had a nurse come to her home on 05/27/22 and administer the Dupixent dose. She does not feel comfortable self-administering and does not live with her daughter. Her next dose would be due 06/10/22.  She has a nurse coming out to her home on 06/06/22. She will let our team know if nurse can administer her doses at home. If so, will not need new start visit but will need PAP application completed under Dr. Anastasia Pall name. However, if they are not able to do so, we can arrange for her to receive at infusion center or otherwise receive at her allergist (whom she would have to re-establish care with).  Knox Saliva, PharmD, MPH, BCPS, CPP Clinical Pharmacist (Rheumatology and Pulmonology)

## 2022-06-03 ENCOUNTER — Other Ambulatory Visit: Payer: Self-pay | Admitting: Allergy

## 2022-06-03 NOTE — Telephone Encounter (Signed)
Returned call to patient regarding Dupixent. She states she'd like her portion of the DMW renewal application mailed.   Provider portion placed in Dr. Anastasia Pall mailbox for signature. PA approval letter placed in PAP pending info folder  Knox Saliva, PharmD, MPH, BCPS, CPP Clinical Pharmacist (Rheumatology and Pulmonology)

## 2022-06-05 NOTE — Telephone Encounter (Signed)
Patient called stating she received PA approval letter from [facility] for [place]. Advised that medication is still >$1000 with insurance and will still require her to return DMW paperwork  Knox Saliva, PharmD, MPH, BCPS, CPP Clinical Pharmacist (Rheumatology and Pulmonology)

## 2022-06-10 ENCOUNTER — Telehealth: Payer: Self-pay | Admitting: Pharmacist

## 2022-06-11 NOTE — Telephone Encounter (Signed)
Returned call to patient. States she signed form and has mailed it back to our clinic  Patient states she also received letter from [facility] for 2024 re-enrollment. Advised her to complete this when she receives new insurance card.  She states she may be switching to [facility] Medicare in 2024. Advised that we will have to re-run authorization through insurance and she will still require PAP more than likely.  Scheduled for Dupixent self-admin training on 08/06/21 and advised her to request her nurse administer until this date. She is willing to train on self-administration with autoinjector pen  Knox Saliva, PharmD, MPH, BCPS, CPP Clinical Pharmacist (Rheumatology and Pulmonology)

## 2022-06-11 NOTE — Telephone Encounter (Signed)
Returned call to patient. States she signed form and has mailed it back to our clinic  Patient states she also received letter from [facility] for 2024 re-enrollment. Advised her to complete this when she receives new insurance card.  She states she may be switching to [facility] Medicare in 2024. Advised that we will have to re-run authorization through insurance and she will still require PAP more than likely.  Scheduled for Dupixent self-admin training on 08/06/21 and advised her to request her nurse administer until this date. She is willing to train on self-administration with autoinjector pen  Knox Saliva, PharmD, MPH, BCPS, CPP Clinical Pharmacist (Rheumatology and Pulmonology)

## 2022-06-12 NOTE — Telephone Encounter (Signed)
Provider portion received and placed in "Awaiting Response" folder.

## 2022-06-14 NOTE — Telephone Encounter (Signed)
Signed forms placed in Pharmacy box upfront.

## 2022-06-17 ENCOUNTER — Other Ambulatory Visit: Payer: Self-pay | Admitting: Pulmonary Disease

## 2022-06-17 ENCOUNTER — Other Ambulatory Visit (HOSPITAL_COMMUNITY): Payer: Self-pay

## 2022-06-17 NOTE — Telephone Encounter (Signed)
Submitted Patient Assistance Application to Dupixent [employer] for DUPIXENT along with provider portion, PA and income documents. Will update patient when we receive a response. ° °Fax# [phone] °Phone# [phone] ° °

## 2022-06-28 LAB — HM DEXA SCAN

## 2022-07-03 NOTE — Telephone Encounter (Signed)
Dupixent my way forms placed in pharmacy box.

## 2022-07-10 ENCOUNTER — Other Ambulatory Visit: Payer: Self-pay | Admitting: Pulmonary Disease

## 2022-07-16 NOTE — Telephone Encounter (Signed)
Received call from Tammy with [facility] regarding [place].  She states they are planning to administer Dupixent on 07/23/2022 and inquired if patient can be trained on pen injector. Advised that I am already planning on meeting with patient on 08/06/2022 to train on pen-injector for self-administration.  Tammy states that they can administer for her every 4 weeks when they are there for their monthly visits but not every 2 weeks.  Phone: [phone]  Knox Saliva, PharmD, MPH, BCPS, CPP Clinical Pharmacist (Rheumatology and Pulmonology)

## 2022-07-17 ENCOUNTER — Other Ambulatory Visit (HOSPITAL_COMMUNITY): Payer: Self-pay

## 2022-07-18 ENCOUNTER — Telehealth: Payer: Self-pay

## 2022-07-18 DIAGNOSIS — J4550 Severe persistent asthma, uncomplicated: Secondary | ICD-10-CM

## 2022-07-18 NOTE — Telephone Encounter (Signed)
Pt indicated on her DMW Part D Renewal application that she would have new insurance in 2024, however she only completed the medical benefits section. ATC pt and LVM on 07/17/22 requesting return call.  Pt returned call and provided Rx information: RxBIN: [id number] [place]: [facility] RxGRP: [id number] Member ID: [id number]   Attempted to submit new PA request through [facility] Part D, however coverage could not be located at this time. Will reattempt submission on 07/23/22.

## 2022-07-23 DIAGNOSIS — L821 Other seborrheic keratosis: Secondary | ICD-10-CM | POA: Diagnosis not present

## 2022-07-23 DIAGNOSIS — L820 Inflamed seborrheic keratosis: Secondary | ICD-10-CM | POA: Diagnosis not present

## 2022-07-23 DIAGNOSIS — L738 Other specified follicular disorders: Secondary | ICD-10-CM | POA: Diagnosis not present

## 2022-07-23 DIAGNOSIS — L720 Epidermal cyst: Secondary | ICD-10-CM | POA: Diagnosis not present

## 2022-07-25 NOTE — Progress Notes (Signed)
Synopsis: First seen by Dr. Tera Partridge at [facility] for Asthma, Left hemidiaphram paralysis, GERD, chronic rhinitis and a pulmonary nodule.  She has glaucoma.  In general asthma has been very well-controlled when using anti IL-13 or IL-5 Ab (has been on [place] and Opal both worked well) Started Dupixent late 2023 again  Subjective:   PATIENT ID: Cassandra Sims GENDER: female DOB: [birth year], MRN: [id number]   HPI  Chief Complaint  Patient presents with   Follow-up    Pt states she is sneezing, runny nose and dry coughing. Pt states the only thing that seems to help is her [facility].   Asthma symptoms have improved since starting Dupixent.  She just started the reapplication process and she is going to come in for self administration training in 11 days with our pharmacy team.  She says that her eyes in general are worse and her vision is more blurry since starting [facility].  Her daughter is with her and says that her asthma symptoms have significantly improved since starting Dupixent again.  GERD has been worse   Past Medical History:  Diagnosis Date   Anxiety    Arthritis    Asthma    Complication of anesthesia    hard to wake up, 2009  sit up in bed to fast and BP dropped low code blue called per pt.   Family history of adverse reaction to anesthesia    sister has naseau   GERD (gastroesophageal reflux disease)    Glaucoma    Headache    hx of migrains   HOH (hard of hearing)    Hypertension    Hypoglycemia    Multiple allergies    Peripheral vascular disease (HCC)    varicose veins , Laser vein surgery   PONV (postoperative nausea and vomiting)    Pre-diabetes    Prolonged QT interval 01/13/2018   Rhinitis    S/P knee replacement 01/13/2018   Wears glasses          Review of Systems  Constitutional:  Negative for chills, fever, malaise/fatigue and weight loss.  HENT:  Negative for congestion, sinus pain and sore throat.   Respiratory:  Negative for  cough, sputum production and shortness of breath.   Cardiovascular:  Negative for chest pain and leg swelling.      Objective:  Physical Exam   Vitals:   07/26/22 0926  BP: 118/66  Pulse: (!) 59  Temp: 97.9 F (36.6 C)  TempSrc: Oral  SpO2: 97%  Weight: 110 lb 6.4 oz (50.1 kg)  Height: 5' 2.5" (1.588 m)   RA  Gen: well appearing HENT: OP clear, neck supple, eyes red PULM: CTA B, normal effort  CV: RRR, no mgr GI: BS+, soft, nontender Derm: no cyanosis or rash Psyche: normal mood and affect    CBC    Component Value Date/Time   WBC 6.7 05/27/2022 0934   RBC 3.94 05/27/2022 0934   HGB 12.3 05/27/2022 0934   HGB 13.6 08/26/2017 1108   HCT 36.4 05/27/2022 0934   HCT 40.8 08/26/2017 1108   PLT 209.0 05/27/2022 0934   PLT 217 08/26/2017 1108   MCV 92.4 05/27/2022 0934   MCV 92 08/26/2017 1108   MCH 31.1 05/21/2022 1625   MCHC 33.7 05/27/2022 0934   RDW 14.7 05/27/2022 0934   RDW 14.0 08/26/2017 1108   LYMPHSABS 1.7 05/27/2022 0934   LYMPHSABS 1.6 08/26/2017 1108   MONOABS 1.2 (H) 05/27/2022 0934   EOSABS 0.3  05/27/2022 0934   EOSABS 0.4 08/26/2017 1108   BASOSABS 0.1 05/27/2022 0934   BASOSABS 0.1 08/26/2017 1108    PFT 08/2017 [place]: FEV 1 1.28L 06/06/16: FVC 2.16 L (88%) FEV1 1.52 L (84%) FEV1/FVC 0.70 FEF 25-75 0.97 L (75%) negative bronchodilator response TLC 4.43 L (90%) RV 93% ERV 58% DLCO corrected 75% (Hgb 15.1)  08/04/12: FVC 2.81 L (110%) FEV1 1.87 L (107%) FEV1/FVC 0.67 FEF 25-75 0.16 L (57%) negative bronchodilator response TLC 4.33 L (95%) RV 79% ERV 87% DLCO uncorrected 93%  IMAGING CXR 08/2017 images independently reviewed showing an elevated left hemidiaphragm.  CT CHEST W/O 12/17/16: Left lower lobe 6 mm nodule unchanged compared to prior  CT CHEST W/ CONTRAST 06/19/16:  Mild centrilobular emphysema. 6 mm nodule along the minor fissure likely subpleural lymph node. Calcified lung nodules also noted. No pleural effusion or thickening. No  pericardial effusion. No pathologic mediastinal adenopathy. Low attenuation lesions in the liver measuring up to 2.3 cm likely cysts.  October 2023 CT chest images independently reviewed showing evidence of left hemidiaphragm paralysis with elevation of the left hemidiaphragm.,  Small areas of bronchiectasis in the left base noted, chronic airway thickening.   BARIUM SWALLOW/ESOPHAGRAM 04/05/16 (per radiologist):  Moderate gastroesophageal reflux. No esophageal dysmotility, stricture or mass.  SNIFF TEST 04/05/16 (per radiologist):  Mild paradoxical movement of the diaphragms with forceful inhalation is consistent with paralysis of the LEFT hemidiaphragm. Chronic elevation of LEFT diaphragm.   LABS 05/27/17 ANA:  Negative   03/29/16 CBC:  8.6/13.8/40.6/232 Eos:  0.6 IgE:  14 RAST Panel:  Negative    November 2023 eosinophil count 300,000 cells per deciliter, IgE 9      Assessment & Plan:   Severe persistent asthma, unspecified whether complicated  Gastroesophageal reflux disease, unspecified whether esophagitis present  Discussion: Cassandra Sims has better control of her asthma on Dupixent.  Glaucoma is worse due to [facility].  Gastroesophageal reflux disease control is worse mostly related to lifestyle issues.  Plan: Severe persistent asthma complicated by glaucoma: Continue Dupixent every other week indefinitely, we will plan on at least a 1 year course of therapy this time Decrease Dulera to 1 puff daily for 1 week then stop Use albuterol as needed for the cough If albuterol is ineffective for cough then next week you can use Dulera 1 puff daily as needed cough, however in general I would like for you to stay away from [facility] because of the glaucoma Practice good hand hygiene Stay physically active  Gastroesophageal reflux disease: Stop eating right before bedtime Continue famotidine Keep head of bed elevated Avoid fatty foods, alcohol, chocolate, caffeine and tobacco products No  eating within 3 hours of bedtime  We will see you back in 3 months or sooner if needed, keep the August 06, 2022 visit with pharmacy.  Immunization History  Administered Date(s) Administered   19-influenza Whole 04/10/2017   Influenza Split 04/21/2013   Influenza Whole 04/14/2012   Influenza, High Dose Seasonal PF 03/31/2018   Influenza,inj,Quad PF,6+ Mos 04/30/2013, 04/24/2015, 05/29/2015, 05/22/2016   Influenza,inj,quad, With Preservative 04/22/2014   Influenza-Unspecified 05/10/2021   PFIZER(Purple Top)SARS-COV-2 Vaccination 08/06/2019, 08/27/2019, 05/31/2020   Pfizer Covid-19 Vaccine Bivalent Booster 52yr & up 12/26/2020, 04/17/2021   Pneumococcal Conjugate,unspecified 07/22/2010   Pneumococcal Conjugate-13 12/10/2008, 07/22/2010, 07/22/2010, 05/29/2015   Pneumococcal Polysaccharide-23 12/10/2008, 03/29/2011   Pneumococcal-Unspecified 12/10/2008   Tdap 04/30/2011   Zoster Recombinat (Shingrix) 11/15/2016, 02/04/2017   Zoster, Live 04/30/2011   Zoster, Unspecified 11/15/2016, 02/04/2017  Current Outpatient Medications:    acetaminophen (TYLENOL) 500 MG tablet, Take 2 tablets (1,000 mg total) by mouth every 8 (eight) hours as needed for mild pain., Disp: 30 tablet, Rfl: 0   albuterol (VENTOLIN HFA) 108 (90 Base) MCG/ACT inhaler, INHALE 2 PUFFS INTO THE LUNGS EVERY 4 (FOUR) HOURS AS NEEDED FOR WHEEZING. REPORTED ON 10/10/2015, Disp: 18 g, Rfl: 1   amLODipine (NORVASC) 2.5 MG tablet, Take 2.5 mg by mouth 2 (two) times daily., Disp: , Rfl:    Azelastine HCl (ASTEPRO) 0.15 % SOLN, Place 1 spray into the nose at bedtime., Disp: , Rfl:    Calcium Carbonate Antacid (TUMS PO), Take 1,000 mg by mouth daily., Disp: , Rfl:    carboxymethylcellulose (REFRESH PLUS) 0.5 % SOLN, Place 1 drop into both eyes 5 (five) times daily as needed (for dry eyes)., Disp: , Rfl:    Cholecalciferol 25 MCG (1000 UT) tablet, Take 1,000 Units by mouth daily., Disp: , Rfl:    cyclobenzaprine (FLEXERIL) 5 MG  tablet, Take 1 tablet (5 mg total) by mouth 3 (three) times daily as needed for muscle spasms., Disp: 30 tablet, Rfl: 0   denosumab (PROLIA) 60 MG/ML SOSY injection, Inject 60 mg into the skin every 6 (six) months., Disp: , Rfl:    dorzolamide (TRUSOPT) 2 % ophthalmic solution, Place 1 drop into both eyes 3 (three) times daily., Disp: , Rfl:    DULERA 100-5 MCG/ACT AERO, INHALE 1 PUFF INTO THE LUNGS DAILY. WITH SPACER AND RINSE MOUTH AFTERWARDS. (Patient taking differently: Inhale 1 puff into the lungs daily.), Disp: 13 each, Rfl: 5   escitalopram (LEXAPRO) 5 MG tablet, Take 2.5 mg by mouth at bedtime., Disp: , Rfl:    famotidine (PEPCID) 20 MG tablet, TAKE 1 TABLET BY MOUTH EVERY DAY, Disp: 90 tablet, Rfl: 1   labetalol (NORMODYNE) 100 MG tablet, Take 1 tablet by mouth 2 (two) times daily., Disp: , Rfl:    Lactobacillus (ACIDOPHILUS PO), Take 1 capsule by mouth daily., Disp: , Rfl:    LUTEIN PO, Take 20 mg by mouth daily., Disp: , Rfl:    Multiple Vitamin (MULTIVITAMIN WITH MINERALS) TABS tablet, Take 1 tablet by mouth daily. Nature Made Multi Vitamin for Her 50+ W/No Iron, Disp: , Rfl:    olmesartan (BENICAR) 40 MG tablet, Take 40 mg by mouth every evening., Disp: , Rfl:    psyllium (METAMUCIL) 58.6 % powder, Take 1 packet by mouth daily. 14.3 g (1 heaping tablespoon) every morning., Disp: , Rfl:    ROCKLATAN 0.02-0.005 % SOLN, Place 1 drop into both eyes at bedtime., Disp: , Rfl:    traMADol (ULTRAM) 50 MG tablet, Take 0.5 tablets (25 mg total) by mouth every 8 (eight) hours as needed for severe pain., Disp: 20 tablet, Rfl: 0   Turmeric 450 MG CAPS, Take 450 mg by mouth daily with lunch. , Disp: , Rfl:   Current Facility-Administered Medications:    dupilumab (DUPIXENT) prefilled syringe 300 mg, 300 mg, Subcutaneous, Q14 Days, Garnet Sierras, DO, 300 mg at 10/11/21 1048

## 2022-07-26 ENCOUNTER — Other Ambulatory Visit (HOSPITAL_COMMUNITY): Payer: Self-pay

## 2022-07-26 ENCOUNTER — Ambulatory Visit: Payer: Medicare Other | Admitting: Pulmonary Disease

## 2022-07-26 ENCOUNTER — Encounter: Payer: Self-pay | Admitting: Pulmonary Disease

## 2022-07-26 VITALS — BP 118/66 | HR 59 | Temp 97.9°F | Ht 62.5 in | Wt 110.4 lb

## 2022-07-26 DIAGNOSIS — K219 Gastro-esophageal reflux disease without esophagitis: Secondary | ICD-10-CM

## 2022-07-26 DIAGNOSIS — J4550 Severe persistent asthma, uncomplicated: Secondary | ICD-10-CM | POA: Diagnosis not present

## 2022-07-26 NOTE — Patient Instructions (Signed)
Severe persistent asthma complicated by glaucoma: Continue Dupixent every other week indefinitely, we will plan on at least a 1 year course of therapy this time Decrease Dulera to 1 puff daily for 1 week then stop Use albuterol as needed for the cough If albuterol is ineffective for cough then next week you can use Dulera 1 puff daily as needed cough, however in general I would like for you to stay away from [facility] because of the glaucoma Practice good hand hygiene Stay physically active  Gastroesophageal reflux disease: Stop eating right before bedtime Continue famotidine Keep head of bed elevated Avoid fatty foods, alcohol, chocolate, caffeine and tobacco products No eating within 3 hours of bedtime  We will see you back in 3 months or sooner if needed, keep the August 06, 2022 visit with pharmacy.

## 2022-07-26 NOTE — Telephone Encounter (Signed)
Submitted an URGENT Prior Authorization request to [facility] for [place] via CoverMyMeds. Will update once we receive a response.  Key: [id number]

## 2022-07-26 NOTE — Telephone Encounter (Signed)
Received notification from [facility] regarding a prior authorization for [place]. Authorization has been APPROVED from 07/26/2022 to 07/27/2023. Approval letter sent to scan center.  Per test claim, copay for 28 days supply is $1,189.79  Authorization # [id number]   Submitted Patient Assistance Application to [place] for [place] along with provider portion, PA and income documents. Will update patient when we receive a response.  Fax# [phone] Phone# [phone]

## 2022-08-02 ENCOUNTER — Telehealth: Payer: Self-pay | Admitting: Pulmonary Disease

## 2022-08-02 MED ORDER — ALBUTEROL SULFATE HFA 108 (90 BASE) MCG/ACT IN AERS: 2.0000 | INHALATION_SPRAY | RESPIRATORY_TRACT | 1 refills | Status: DC | PRN

## 2022-08-02 NOTE — Telephone Encounter (Signed)
Spoke with pt and notified her that Albuterol HFA refill will be sent to [facility] on [address] in [place]. Pt stated understanding. Nothing further needed at this time.

## 2022-08-02 NOTE — Telephone Encounter (Signed)
PT needs more Albuterol  Pharm is new: [facility]

## 2022-08-06 ENCOUNTER — Ambulatory Visit: Payer: Medicare Other | Admitting: Pharmacist

## 2022-08-06 ENCOUNTER — Telehealth: Payer: Self-pay | Admitting: Pharmacist

## 2022-08-06 DIAGNOSIS — Z7189 Other specified counseling: Secondary | ICD-10-CM

## 2022-08-06 DIAGNOSIS — J4550 Severe persistent asthma, uncomplicated: Secondary | ICD-10-CM

## 2022-08-06 NOTE — Progress Notes (Signed)
HPI Patient presents today to [facility] to see pharmacy team for [place] self-administration training. She is accompanied by her daughter Darius Bump.    She has been on Dupixent for some time through asthma/allergy and now wants managed by Dr. Lake Bells. Since he is new provider, we have to re-enroll pt through patient assistance program. Additionally, asthma/allergy was completing in-office injections for her. She is here today to learn how to self-administer. Her home hospice nurse is willing to administer Dupixent when they are on site but are not able to administer every 2 weeks  She is nervous about self-administering Dupixent. She states she was not aware of autoinjector pen formulation  Respiratory Medications Current regimen: Dulera 100-5 mcg (1 puffs once daily) Patient reports no known adherence challenges  OBJECTIVE Allergies  Allergen Reactions   Penicillins Shortness Of Breath and Swelling    Has patient had a PCN reaction causing immediate rash, facial/tongue/throat swelling, SOB or lightheadedness with hypotension: Yes Has patient had a PCN reaction causing severe rash involving mucus membranes or skin necrosis: No Has patient had a PCN reaction that required hospitalization: No Has patient had a PCN reaction occurring within the last 10 years: No If all of the above answers are "NO", then may proceed with Cephalosporin use.    Zantac [Ranitidine] Itching   Apraclonidine Other (See Comments)    Very light sensitive, swollen eye   Brinzolamide Other (See Comments)   Doxycycline Other (See Comments)    Pt states she tolerates OKAY   Lactose Other (See Comments)   Adhesive [Tape] Itching    BAND-AID TAPE-skin redness/itching   Clindamycin/Lincomycin Other (See Comments)    Severe joint pain.   Codeine Nausea And Vomiting   Darvocet [Propoxyphene N-Acetaminophen] Other (See Comments)    HTN   Dilaudid [Hydromorphone Hcl] Nausea And Vomiting    DROP BLOOD  PRESSURE HYPOTENSIVE   Flagyl [Metronidazole] Other (See Comments)    Joint pains   Lactose Intolerance (Gi)    Levofloxacin     Joint pain   Macrodantin [Nitrofurantoin Macrocrystal]    Morphine And Related     hallucinate   Other    Pedi-Pre Tape Spray [Wound Dressing Adhesive]    Premarin [Conjugated Estrogens] Itching   Tramadol Hcl Other (See Comments)    Causes insomnia   Vancomycin Hives and Itching   Zofran [Ondansetron Hcl]     HTN   Clarithromycin     Other reaction(s): Abdominal Pain Anxious/nervous feeling   Denosumab Rash   Sulfa Antibiotics Rash    Outpatient Encounter Medications as of 08/06/2022  Medication Sig   acetaminophen (TYLENOL) 500 MG tablet Take 2 tablets (1,000 mg total) by mouth every 8 (eight) hours as needed for mild pain.   albuterol (VENTOLIN HFA) 108 (90 Base) MCG/ACT inhaler Inhale 2 puffs into the lungs every 4 (four) hours as needed for wheezing. Reported on 10/10/2015   amLODipine (NORVASC) 2.5 MG tablet Take 2.5 mg by mouth 2 (two) times daily.   Azelastine HCl (ASTEPRO) 0.15 % SOLN Place 1 spray into the nose at bedtime.   Calcium Carbonate Antacid (TUMS PO) Take 1,000 mg by mouth daily.   carboxymethylcellulose (REFRESH PLUS) 0.5 % SOLN Place 1 drop into both eyes 5 (five) times daily as needed (for dry eyes).   Cholecalciferol 25 MCG (1000 UT) tablet Take 1,000 Units by mouth daily.   cyclobenzaprine (FLEXERIL) 5 MG tablet Take 1 tablet (5 mg total) by mouth 3 (three) times daily as  needed for muscle spasms.   denosumab (PROLIA) 60 MG/ML SOSY injection Inject 60 mg into the skin every 6 (six) months.   dorzolamide (TRUSOPT) 2 % ophthalmic solution Place 1 drop into both eyes 3 (three) times daily.   DULERA 100-5 MCG/ACT AERO INHALE 1 PUFF INTO THE LUNGS DAILY. WITH SPACER AND RINSE MOUTH AFTERWARDS. (Patient taking differently: Inhale 1 puff into the lungs daily.)   escitalopram (LEXAPRO) 5 MG tablet Take 2.5 mg by mouth at bedtime.    famotidine (PEPCID) 20 MG tablet TAKE 1 TABLET BY MOUTH EVERY DAY   labetalol (NORMODYNE) 100 MG tablet Take 1 tablet by mouth 2 (two) times daily.   Lactobacillus (ACIDOPHILUS PO) Take 1 capsule by mouth daily.   LUTEIN PO Take 20 mg by mouth daily.   Multiple Vitamin (MULTIVITAMIN WITH MINERALS) TABS tablet Take 1 tablet by mouth daily. Nature Made Multi Vitamin for Her 50+ W/No Iron   olmesartan (BENICAR) 40 MG tablet Take 40 mg by mouth every evening.   psyllium (METAMUCIL) 58.6 % powder Take 1 packet by mouth daily. 14.3 g (1 heaping tablespoon) every morning.   ROCKLATAN 0.02-0.005 % SOLN Place 1 drop into both eyes at bedtime.   traMADol (ULTRAM) 50 MG tablet Take 0.5 tablets (25 mg total) by mouth every 8 (eight) hours as needed for severe pain.   Turmeric 450 MG CAPS Take 450 mg by mouth daily with lunch.    Facility-Administered Encounter Medications as of 08/06/2022  Medication   dupilumab (DUPIXENT) prefilled syringe 300 mg     Immunization History  Administered Date(s) Administered   19-influenza Whole 04/10/2017   Influenza Split 04/21/2013   Influenza Whole 04/14/2012   Influenza, High Dose Seasonal PF 03/31/2018   Influenza,inj,Quad PF,6+ Mos 04/30/2013, 04/24/2015, 05/29/2015, 05/22/2016   Influenza,inj,quad, With Preservative 04/22/2014   Influenza-Unspecified 05/10/2021   PFIZER(Purple Top)SARS-COV-2 Vaccination 08/06/2019, 08/27/2019, 05/31/2020   Pfizer Covid-19 Vaccine Bivalent Booster 81yr & up 12/26/2020, 04/17/2021   Pneumococcal Conjugate,unspecified 07/22/2010   Pneumococcal Conjugate-13 12/10/2008, 07/22/2010, 07/22/2010, 05/29/2015   Pneumococcal Polysaccharide-23 12/10/2008, 03/29/2011   Pneumococcal-Unspecified 12/10/2008   Tdap 04/30/2011   Zoster Recombinat (Shingrix) 11/15/2016, 02/04/2017   Zoster, Live 04/30/2011   Zoster, Unspecified 11/15/2016, 02/04/2017     PFTs    Latest Ref Rng & Units 06/06/2016    3:07 PM  PFT Results  FVC-Pre L  2.16   FVC-Predicted Pre % 88   FVC-Post L 2.11   FVC-Predicted Post % 86   Pre FEV1/FVC % % 70   Post FEV1/FCV % % 77   FEV1-Pre L 1.52   FEV1-Predicted Pre % 84   FEV1-Post L 1.62   DLCO uncorrected ml/min/mmHg 18.08   DLCO UNC% % 78   DLCO corrected ml/min/mmHg 17.24   DLCO COR %Predicted % 75   DLVA Predicted % 94   TLC L 4.43   TLC % Predicted % 90   RV % Predicted % 93     Assessment   Biologics training for dupilumab (Dupixent)  Dose: '300mg'$  every 14 days thereafter  Administration/Storage:  Reviewed administration sites of thigh or abdomen (at least 2-3 inches away from abdomen). Reviewed the upper arm is only appropriate if caregiver is administering injection  Reviewed storage of medication in refrigerator. Reviewed that [place] be stored at room temperature in unopened carton for up to 14 days.  Access: Approval of Dupixent through: patient assistance. 2024 enrollment remains pending  Patient self-administered Dupixent '300mg'$ /27mx 1 in right upper thigh  using sample Dupixent '300mg'$ /71m autoinjector pen NDC: [id number]: [id number]: 05/21/2024  Medication Reconciliation  A drug regimen assessment was performed, including review of allergies, interactions, disease-state management, dosing and immunization history. Medications were reviewed with the patient, including name, instructions, indication, goals of therapy, potential side effects, importance of adherence, and safe use.  Drug interaction(s): none noted  PLAN Continue Dupixent '300mg'$  every 14 days.  Next dose is due 08/20/2022 and every 14 days thereafter. Rx sent to: [facility]: [id number]  I provided her with one additional Dupixent sample but we will continue to follow-up on patient assistance approval. She is confident that she can self-administer Dupixent at home and did really well with injection today. Hands were stable during injection process. Continue maintenance asthma  regimen of: Dulera 100-5 mcg (1 puffs once daily)  All questions encouraged and answered.  Instructed patient to reach out with any further questions or concerns.  Thank you for allowing pharmacy to participate in this patient's care.  This appointment required 20 minutes of patient care (this includes precharting, chart review, review of results, face-to-face care, etc.).  DKnox Saliva PharmD, MPH, BCPS, CPP Clinical Pharmacist (Rheumatology and Pulmonology)

## 2022-08-06 NOTE — Telephone Encounter (Signed)
Patient successfully self-administered Dupixent on 08/06/2022. Provided her with one additional sample. Called [employer] for update on 2024 renewal. Rep states re-enrollment is still processing.  Phone: [phone], option 1, option 2  Knox Saliva, PharmD, MPH, BCPS, CPP Clinical Pharmacist (Rheumatology and Pulmonology)

## 2022-08-06 NOTE — Telephone Encounter (Signed)
PT has question on medication usage. Pls call @ [phone]. Pls call.

## 2022-08-07 NOTE — Telephone Encounter (Signed)
Returned call to patient regarding Dupixent PAP. She states she was confident they said she was approved. I advised that likely hte insurance called her but that I called [employer] yesterday and was information PAP remains pending. I did let her know that [employer] is aware that her next dose is due 08/20/2022. Will reach out to pt once we have an update.  Knox Saliva, PharmD, MPH, BCPS, CPP Clinical Pharmacist (Rheumatology and Pulmonology)

## 2022-08-07 NOTE — Telephone Encounter (Signed)
Patient calling back to talk to [facility] regarding Dupixent

## 2022-08-09 NOTE — Patient Instructions (Signed)
[name] or I will call you with an update from Dupixent's patient assistance program as soon as we get an update.

## 2022-08-12 ENCOUNTER — Telehealth: Payer: Self-pay

## 2022-08-12 ENCOUNTER — Other Ambulatory Visit: Payer: Self-pay

## 2022-08-12 DIAGNOSIS — R059 Cough, unspecified: Secondary | ICD-10-CM | POA: Diagnosis not present

## 2022-08-12 DIAGNOSIS — I10 Essential (primary) hypertension: Secondary | ICD-10-CM | POA: Diagnosis not present

## 2022-08-12 DIAGNOSIS — J4550 Severe persistent asthma, uncomplicated: Secondary | ICD-10-CM | POA: Diagnosis not present

## 2022-08-12 DIAGNOSIS — I700 Atherosclerosis of aorta: Secondary | ICD-10-CM | POA: Diagnosis not present

## 2022-08-12 DIAGNOSIS — J439 Emphysema, unspecified: Secondary | ICD-10-CM | POA: Diagnosis not present

## 2022-08-12 NOTE — Progress Notes (Signed)
Please disregard

## 2022-08-12 NOTE — Telephone Encounter (Signed)
Called patient back - DOB verified - advised patient the Cetirizine (Zyrtec) 5 mg is not covered by her insurance. Patient advised she will have to purchase the medication over the counter - MAKE SURE to purchase 5 mg and NOT 10 mg - take 1 tablet by mouth once a day as needed.  Patient verbalized understanding, no further questions.

## 2022-08-13 NOTE — Progress Notes (Signed)
Reviewed, agree 

## 2022-08-14 ENCOUNTER — Other Ambulatory Visit: Payer: Self-pay | Admitting: Allergy

## 2022-08-14 NOTE — Telephone Encounter (Signed)
Received fax from [employer] stating that they've received [employer]W enrollment form (?) despite it being faxed weeks ago.  Knox Saliva, PharmD, MPH, BCPS, CPP Clinical Pharmacist (Rheumatology and Pulmonology)

## 2022-08-15 DIAGNOSIS — H401123 Primary open-angle glaucoma, left eye, severe stage: Secondary | ICD-10-CM | POA: Diagnosis not present

## 2022-08-15 DIAGNOSIS — H401133 Primary open-angle glaucoma, bilateral, severe stage: Secondary | ICD-10-CM | POA: Diagnosis not present

## 2022-08-15 DIAGNOSIS — H401113 Primary open-angle glaucoma, right eye, severe stage: Secondary | ICD-10-CM | POA: Diagnosis not present

## 2022-08-16 MED ORDER — DUPIXENT 300 MG/2ML ~~LOC~~ SOAJ: 300.0000 mg | SUBCUTANEOUS | 1 refills | Status: DC

## 2022-08-16 NOTE — Telephone Encounter (Signed)
Received letter from [employer] on 1/25 stating that application was being processed as of the 23rd (despite it being sent back on 07/26/22).  Pt reached out today looking for update, explained where we were at and what the next steps would be. Discussed that I would be reaching out to our [facility] in regards to another pt but that I would also ask about her case as well. In the meantime I provided her with the phone number to [employer] and advised that she reach out to them and express financial hardship. She verbalized understanding and stated that she would go ahead and do that right now.  Will f/u with Corene Cornea to ensure her application continues to be processed appropriately.

## 2022-08-16 NOTE — Telephone Encounter (Signed)
Rx for Dupixent sent to [facility] today  Knox Saliva, PharmD, MPH, BCPS, CPP Clinical Pharmacist (Rheumatology and Pulmonology)

## 2022-08-16 NOTE — Telephone Encounter (Signed)
Per Melody Haver that [employer]W only needs a new Rx to be sent in to [facility] in order to move forward with this pt's application. Will route to [facility] for f/u.

## 2022-08-19 DIAGNOSIS — I10 Essential (primary) hypertension: Secondary | ICD-10-CM | POA: Diagnosis not present

## 2022-08-20 ENCOUNTER — Telehealth: Payer: Self-pay | Admitting: Pulmonary Disease

## 2022-08-20 MED ORDER — AZITHROMYCIN 250 MG PO TABS: 250.0000 mg | ORAL_TABLET | ORAL | 0 refills | Status: DC

## 2022-08-20 NOTE — Telephone Encounter (Signed)
Routing to TP

## 2022-08-20 NOTE — Telephone Encounter (Signed)
I called and spoke with the pt and notified of response per Tammy  She verbalized understanding  Zpack was sent    [facility], she says that she thinks she did not get her full dose of the dupixent today She states it was hard to inject and "half of it ran down my leg"

## 2022-08-20 NOTE — Telephone Encounter (Signed)
Patient of Dr. Anastasia Pall with severe persistent asthma with allergic phenotype   Cassandra Sims was stopped due to glaucoma unfortunately looks like her symptoms have flared off of Dulera possibly.  Can use Delsym 2 teaspoons twice daily as needed for cough Can use Tessalon 3 times daily as needed for cough  For now would restart Dulera 2 puffs twice daily for the next 2 days then 2 puffs once daily. Hold off on steroids at this time.  Z-Pak No. 1 take as directed-this is an antibiotic to have on hold if symptoms worsen with discolored mucus.  Will send to Dr. Lake Bells as may need to come up with an alternative plan if unable to manage asthma off of Dulera and balance her glaucoma symptoms.  If not improving or worsens will need to be seen in the office sooner. Please contact office for sooner follow up if symptoms do not improve or worsen or seek emergency care

## 2022-08-20 NOTE — Telephone Encounter (Signed)
Primary Pulmonologist: Dr. Lake Bells Last office visit and with whom: 07/26/2022 [place] What do we see them for (pulmonary problems): Severe persistent asthma, GI reflux Last OV assessment/plan:   Assessment & Plan:    Severe persistent asthma, unspecified whether complicated   Gastroesophageal reflux disease, unspecified whether esophagitis present   Discussion: Cassandra Sims has better control of her asthma on Dupixent.  Glaucoma is worse due to [facility].  Gastroesophageal reflux disease control is worse mostly related to lifestyle issues.   Plan: Severe persistent asthma complicated by glaucoma: Continue Dupixent every other week indefinitely, we will plan on at least a 1 year course of therapy this time Decrease Dulera to 1 puff daily for 1 week then stop Use albuterol as needed for the cough If albuterol is ineffective for cough then next week you can use Dulera 1 puff daily as needed cough, however in general I would like for you to stay away from [facility] because of the glaucoma Practice good hand hygiene Stay physically active   Gastroesophageal reflux disease: Stop eating right before bedtime Continue famotidine Keep head of bed elevated Avoid fatty foods, alcohol, chocolate, caffeine and tobacco products No eating within 3 hours of bedtime   We will see you back in 3 months or sooner if needed, keep the August 06, 2022 visit with pharmacy.       Immunization History  Administered Date(s) Administered   19-influenza Whole 04/10/2017   Influenza Split 04/21/2013   Influenza Whole 04/14/2012   Influenza, High Dose Seasonal PF 03/31/2018   Influenza,inj,Quad PF,6+ Mos 04/30/2013, 04/24/2015, 05/29/2015, 05/22/2016   Influenza,inj,quad, With Preservative 04/22/2014   Influenza-Unspecified 05/10/2021   PFIZER(Purple Top)SARS-COV-2 Vaccination 08/06/2019, 08/27/2019, 05/31/2020   Pfizer Covid-19 Vaccine Bivalent Booster 53yr & up 12/26/2020, 04/17/2021   Pneumococcal  Conjugate,unspecified 07/22/2010   Pneumococcal Conjugate-13 12/10/2008, 07/22/2010, 07/22/2010, 05/29/2015   Pneumococcal Polysaccharide-23 12/10/2008, 03/29/2011   Pneumococcal-Unspecified 12/10/2008   Tdap 04/30/2011   Zoster Recombinat (Shingrix) 11/15/2016, 02/04/2017   Zoster, Live 04/30/2011   Zoster, Unspecified 11/15/2016, 02/04/2017          Current Outpatient Medications:    acetaminophen (TYLENOL) 500 MG tablet, Take 2 tablets (1,000 mg total) by mouth every 8 (eight) hours as needed for mild pain., Disp: 30 tablet, Rfl: 0   albuterol (VENTOLIN HFA) 108 (90 Base) MCG/ACT inhaler, INHALE 2 PUFFS INTO THE LUNGS EVERY 4 (FOUR) HOURS AS NEEDED FOR WHEEZING. REPORTED ON 10/10/2015, Disp: 18 g, Rfl: 1   amLODipine (NORVASC) 2.5 MG tablet, Take 2.5 mg by mouth 2 (two) times daily., Disp: , Rfl:    Azelastine HCl (ASTEPRO) 0.15 % SOLN, Place 1 spray into the nose at bedtime., Disp: , Rfl:    Calcium Carbonate Antacid (TUMS PO), Take 1,000 mg by mouth daily., Disp: , Rfl:    carboxymethylcellulose (REFRESH PLUS) 0.5 % SOLN, Place 1 drop into both eyes 5 (five) times daily as needed (for dry eyes)., Disp: , Rfl:    Cholecalciferol 25 MCG (1000 UT) tablet, Take 1,000 Units by mouth daily., Disp: , Rfl:    cyclobenzaprine (FLEXERIL) 5 MG tablet, Take 1 tablet (5 mg total) by mouth 3 (three) times daily as needed for muscle spasms., Disp: 30 tablet, Rfl: 0   denosumab (PROLIA) 60 MG/ML SOSY injection, Inject 60 mg into the skin every 6 (six) months., Disp: , Rfl:    dorzolamide (TRUSOPT) 2 % ophthalmic solution, Place 1 drop into both eyes 3 (three) times daily., Disp: , Rfl:    DULERA 100-5  MCG/ACT AERO, INHALE 1 PUFF INTO THE LUNGS DAILY. WITH SPACER AND RINSE MOUTH AFTERWARDS. (Patient taking differently: Inhale 1 puff into the lungs daily.), Disp: 13 each, Rfl: 5   escitalopram (LEXAPRO) 5 MG tablet, Take 2.5 mg by mouth at bedtime., Disp: , Rfl:    famotidine (PEPCID) 20 MG tablet, TAKE 1  TABLET BY MOUTH EVERY DAY, Disp: 90 tablet, Rfl: 1   labetalol (NORMODYNE) 100 MG tablet, Take 1 tablet by mouth 2 (two) times daily., Disp: , Rfl:    Lactobacillus (ACIDOPHILUS PO), Take 1 capsule by mouth daily., Disp: , Rfl:    LUTEIN PO, Take 20 mg by mouth daily., Disp: , Rfl:    Multiple Vitamin (MULTIVITAMIN WITH MINERALS) TABS tablet, Take 1 tablet by mouth daily. Nature Made Multi Vitamin for Her 50+ W/No Iron, Disp: , Rfl:    olmesartan (BENICAR) 40 MG tablet, Take 40 mg by mouth every evening., Disp: , Rfl:    psyllium (METAMUCIL) 58.6 % powder, Take 1 packet by mouth daily. 14.3 g (1 heaping tablespoon) every morning., Disp: , Rfl:    ROCKLATAN 0.02-0.005 % SOLN, Place 1 drop into both eyes at bedtime., Disp: , Rfl:    traMADol (ULTRAM) 50 MG tablet, Take 0.5 tablets (25 mg total) by mouth every 8 (eight) hours as needed for severe pain., Disp: 20 tablet, Rfl: 0   Turmeric 450 MG CAPS, Take 450 mg by mouth daily with lunch. , Disp: , Rfl:    Current Facility-Administered Medications:    dupilumab (DUPIXENT) prefilled syringe 300 mg, 300 mg, Subcutaneous, Q14 Days, Garnet Sierras, DO, 300 mg at 10/11/21 1048        Patient Instructions by Juanito Doom, MD at 07/26/2022 9:00 AM  Author: Juanito Doom, MD Author Type: Physician Filed: 07/26/2022  9:57 AM  Note Status: Signed Cosign: Cosign Not Required Encounter Date: 07/26/2022  Editor: Juanito Doom, MD (Physician)             Severe persistent asthma complicated by glaucoma: Continue Dupixent every other week indefinitely, we will plan on at least a 1 year course of therapy this time Decrease Dulera to 1 puff daily for 1 week then stop Use albuterol as needed for the cough If albuterol is ineffective for cough then next week you can use Dulera 1 puff daily as needed cough, however in general I would like for you to stay away from [facility] because of the [place] good hand hygiene Stay physically active    Gastroesophageal reflux disease: Stop eating right before bedtime Continue famotidine Keep head of bed elevated Avoid fatty foods, alcohol, chocolate, caffeine and tobacco products No eating within 3 hours of bedtime   We will see you back in 3 months or sooner if needed, keep the August 06, 2022 visit with pharmacy.       Orthostatic Vitals Recorded in This Encounter   07/26/2022 0926     Patient Position: Sitting  BP Location: Left Arm  Cuff Size: Normal   Instructions   Return in about 3 months (around 10/25/2022). Severe persistent asthma complicated by glaucoma: Continue Dupixent every other week indefinitely, we will plan on at least a 1 year course of therapy this time Decrease Dulera to 1 puff daily for 1 week then stop Use albuterol as needed for the cough If albuterol is ineffective for cough then next week you can use Dulera 1 puff daily as needed cough, however in general I would like for  you to stay away from [facility] because of the [place] good hand hygiene Stay physically active   Gastroesophageal reflux disease: Stop eating right before bedtime Continue famotidine Keep head of bed elevated Avoid fatty foods, alcohol, chocolate, caffeine and tobacco products No eating within 3 hours of bedtime   We will see you back in 3 months or sooner if needed, keep the August 06, 2022 visit with pharmacy.       Was appointment offered to patient (explain)?  Refused, last seen 07/26/2022   Reason for call: Dry hacking cough for 2 weeks.  She was given the tessalon by her PCP, she has only been taking 1 tablet daily, she cannot see well and says she now knows she can take it 3 times per day.  Yesterday sounded like she was getting some congestion.  It is mostly when she lays down.  She feels bad (she is worn out from coughing).  It is waking up at night.  Has made her chest sore.  She is more sob than what is normal for her.  States she was taken off the [facility].   She does use her albuterol during the night to calm the cough down when she can no longer stand it.  She denies any fever, chills, body aches or sick contacts.  Dr. Lake Bells, please advise. Thank you.  (examples of things to ask: : When did symptoms start? Fever? Cough? Productive? Color to sputum? More sputum than usual? Wheezing? Have you needed increased oxygen? Are you taking your respiratory medications? What over the counter measures have you tried?)  Allergies  Allergen Reactions   Penicillins Shortness Of Breath and Swelling    Has patient had a PCN reaction causing immediate rash, facial/tongue/throat swelling, SOB or lightheadedness with hypotension: Yes Has patient had a PCN reaction causing severe rash involving mucus membranes or skin necrosis: No Has patient had a PCN reaction that required hospitalization: No Has patient had a PCN reaction occurring within the last 10 years: No If all of the above answers are "NO", then may proceed with Cephalosporin use.    Zantac [Ranitidine] Itching   Apraclonidine Other (See Comments)    Very light sensitive, swollen eye   Brinzolamide Other (See Comments)   Doxycycline Other (See Comments)    Pt states she tolerates OKAY   Lactose Other (See Comments)   Adhesive [Tape] Itching    BAND-AID TAPE-skin redness/itching   Clindamycin/Lincomycin Other (See Comments)    Severe joint pain.   Codeine Nausea And Vomiting   Darvocet [Propoxyphene N-Acetaminophen] Other (See Comments)    HTN   Dilaudid [Hydromorphone Hcl] Nausea And Vomiting    DROP BLOOD PRESSURE HYPOTENSIVE   Flagyl [Metronidazole] Other (See Comments)    Joint pains   Lactose Intolerance (Gi)    Levofloxacin     Joint pain   Macrodantin [Nitrofurantoin Macrocrystal]    Morphine And Related     hallucinate   Other    Pedi-Pre Tape Spray [Wound Dressing Adhesive]    Premarin [Conjugated Estrogens] Itching   Tramadol Hcl Other (See Comments)    Causes insomnia    Vancomycin Hives and Itching   Zofran [Ondansetron Hcl]     HTN   Clarithromycin     Other reaction(s): Abdominal Pain Anxious/nervous feeling   Denosumab Rash   Sulfa Antibiotics Rash    Immunization History  Administered Date(s) Administered   19-influenza Whole 04/10/2017   Influenza Split 04/21/2013   Influenza Whole 04/14/2012  Influenza, High Dose Seasonal PF 03/31/2018   Influenza,inj,Quad PF,6+ Mos 04/30/2013, 04/24/2015, 05/29/2015, 05/22/2016   Influenza,inj,quad, With Preservative 04/22/2014   Influenza-Unspecified 05/10/2021   PFIZER(Purple Top)SARS-COV-2 Vaccination 08/06/2019, 08/27/2019, 05/31/2020   Pfizer Covid-19 Vaccine Bivalent Booster 36yr & up 12/26/2020, 04/17/2021   Pneumococcal Conjugate,unspecified 07/22/2010   Pneumococcal Conjugate-13 12/10/2008, 07/22/2010, 07/22/2010, 05/29/2015   Pneumococcal Polysaccharide-23 12/10/2008, 03/29/2011   Pneumococcal-Unspecified 12/10/2008   Tdap 04/30/2011   Zoster Recombinat (Shingrix) 11/15/2016, 02/04/2017   Zoster, Live 04/30/2011   Zoster, Unspecified 11/15/2016, 02/04/2017

## 2022-08-21 ENCOUNTER — Telehealth: Payer: Self-pay | Admitting: Pharmacist

## 2022-08-21 DIAGNOSIS — I10 Essential (primary) hypertension: Secondary | ICD-10-CM | POA: Diagnosis not present

## 2022-08-21 NOTE — Telephone Encounter (Signed)
Called Pt with recommendations from [facility]. Pt already has appointment on 09/04/2022 to see Dr Lake Bells. Pt stated understanding and nothing further needed at this time.

## 2022-08-21 NOTE — Telephone Encounter (Signed)
Opened in error

## 2022-08-23 NOTE — Telephone Encounter (Signed)
Patient assistance approval letter received from [employer]. Patient is approved through 07/22/2023  Knox Saliva, PharmD, MPH, BCPS, CPP Clinical Pharmacist (Rheumatology and Pulmonology)

## 2022-08-23 NOTE — Telephone Encounter (Signed)
Called [employer] regarding status update on patient assistance program. Per rep, program is still processing but will likely be approved through 07/22/2023. Confirmed clinic fax number. Advised patient of this and that she should reach out to [facility] to schedule shipment if she has not heard from them by next Wednesday.  Phone: [phone]  Will await approval letter  Knox Saliva, PharmD, MPH, BCPS, CPP Clinical Pharmacist (Rheumatology and Pulmonology)

## 2022-08-23 NOTE — Telephone Encounter (Signed)
Returned call to patient regarding Dupixent. She states she received a call for Dupixent stating it'll be $500. She didn't know who it was from due to difficulty hearing. She states that she did not provide payment information. I advised it may have been [employer] performing eligibility check for program.  Advise dher that I received confirmation from program that she is essentially re-approved for PAP through 07/22/2023. Told her to reach out to [facility] if she has not heard from them by next Wednesday.   I spoke with her about the dose she last administered. States that hospice nurse didn't really help her. Reviewed that she needs to wait until both clicks sound off from pen before lifting from leg. She verbalized understanding to all of the above  Knox Saliva, PharmD, MPH, BCPS, CPP Clinical Pharmacist (Rheumatology and Pulmonology)

## 2022-08-28 ENCOUNTER — Telehealth: Payer: Self-pay | Admitting: Pulmonary Disease

## 2022-08-29 NOTE — Telephone Encounter (Signed)
Patient called stating rx for Dupixent pen is missing. She said pharmacy needs clarification. Called pharmacy to advise that pt is switching to pen. Conference called patient with [employer] to set up shipment. Rep states she is unable to do so since it's in pharmacist review stage. Rep states she will call patient back in about an hour to schedule shipment.  Knox Saliva, PharmD, MPH, BCPS, CPP Clinical Pharmacist (Rheumatology and Pulmonology)

## 2022-09-04 ENCOUNTER — Ambulatory Visit: Payer: Medicare Other | Admitting: Pulmonary Disease

## 2022-09-04 ENCOUNTER — Encounter: Payer: Self-pay | Admitting: Pulmonary Disease

## 2022-09-04 VITALS — BP 126/70 | HR 62 | Temp 97.9°F | Ht 62.5 in | Wt 109.8 lb

## 2022-09-04 DIAGNOSIS — Z7189 Other specified counseling: Secondary | ICD-10-CM

## 2022-09-04 DIAGNOSIS — H409 Unspecified glaucoma: Secondary | ICD-10-CM

## 2022-09-04 DIAGNOSIS — K219 Gastro-esophageal reflux disease without esophagitis: Secondary | ICD-10-CM | POA: Diagnosis not present

## 2022-09-04 DIAGNOSIS — J4550 Severe persistent asthma, uncomplicated: Secondary | ICD-10-CM | POA: Diagnosis not present

## 2022-09-04 NOTE — Progress Notes (Signed)
Synopsis: First seen by Dr. Tera Partridge at [facility] for Asthma, Left hemidiaphram paralysis, GERD, chronic rhinitis and a pulmonary nodule.  She has glaucoma.  In general asthma has been very well-controlled when using anti IL-13 or IL-5 Ab (has been on [place] and [place] both worked well) Started Dupixent late 2023 again  Subjective:   PATIENT ID: Cassandra Sims GENDER: female DOB: [birth year], MRN: [id number]   HPI  Chief Complaint  Patient presents with   Follow-up    Coughing.    Cassandra Sims says that she continues to cough a lot and have a lot of trouble with her eyes She's not using Dulera because of her eye trouble and her glaucoma and dry eyes She sees Drs. Bond and Pichardo-Giegengack and is now on as much as 9 drops for her eyes.   Past Medical History:  Diagnosis Date   Anxiety    Arthritis    Asthma    Complication of anesthesia    hard to wake up, 2009  sit up in bed to fast and BP dropped low code blue called per pt.   Family history of adverse reaction to anesthesia    sister has naseau   GERD (gastroesophageal reflux disease)    Glaucoma    Headache    hx of migrains   HOH (hard of hearing)    Hypertension    Hypoglycemia    Multiple allergies    Peripheral vascular disease (HCC)    varicose veins , Laser vein surgery   PONV (postoperative nausea and vomiting)    Pre-diabetes    Prolonged QT interval 01/13/2018   Rhinitis    S/P knee replacement 01/13/2018   Wears glasses          Review of Systems  Constitutional:  Negative for chills, fever, malaise/fatigue and weight loss.  HENT:  Negative for congestion, sinus pain and sore throat.   Respiratory:  Negative for cough, sputum production and shortness of breath.   Cardiovascular:  Negative for chest pain and leg swelling.      Objective:  Physical Exam   Vitals:   09/04/22 0832  BP: 126/70  Pulse: 62  Temp: 97.9 F (36.6 C)  TempSrc: Oral  SpO2: 97%  Weight: 109 lb 12.8 oz  (49.8 kg)  Height: 5' 2.5" (1.588 m)   RA  Gen: well appearing HENT: OP clear, neck supple PULM: CTA B, normal effort  CV: RRR, no mgr GI: BS+, soft, nontender Derm: no cyanosis or rash Psyche: normal mood and affect    CBC    Component Value Date/Time   WBC 6.7 05/27/2022 0934   RBC 3.94 05/27/2022 0934   HGB 12.3 05/27/2022 0934   HGB 13.6 08/26/2017 1108   HCT 36.4 05/27/2022 0934   HCT 40.8 08/26/2017 1108   PLT 209.0 05/27/2022 0934   PLT 217 08/26/2017 1108   MCV 92.4 05/27/2022 0934   MCV 92 08/26/2017 1108   MCH 31.1 05/21/2022 1625   MCHC 33.7 05/27/2022 0934   RDW 14.7 05/27/2022 0934   RDW 14.0 08/26/2017 1108   LYMPHSABS 1.7 05/27/2022 0934   LYMPHSABS 1.6 08/26/2017 1108   MONOABS 1.2 (H) 05/27/2022 0934   EOSABS 0.3 05/27/2022 0934   EOSABS 0.4 08/26/2017 1108   BASOSABS 0.1 05/27/2022 0934   BASOSABS 0.1 08/26/2017 1108    PFT 08/2017 [place]: FEV 1 1.28L 06/06/16: FVC 2.16 L (88%) FEV1 1.52 L (84%) FEV1/FVC 0.70 FEF 25-75 0.97 L (75%) negative bronchodilator  response TLC 4.43 L (90%) RV 93% ERV 58% DLCO corrected 75% (Hgb 15.1)  08/04/12: FVC 2.81 L (110%) FEV1 1.87 L (107%) FEV1/FVC 0.67 FEF 25-75 0.16 L (57%) negative bronchodilator response TLC 4.33 L (95%) RV 79% ERV 87% DLCO uncorrected 93%  IMAGING CXR 08/2017 images independently reviewed showing an elevated left hemidiaphragm.  CT CHEST W/O 12/17/16: Left lower lobe 6 mm nodule unchanged compared to prior  CT CHEST W/ CONTRAST 06/19/16:  Mild centrilobular emphysema. 6 mm nodule along the minor fissure likely subpleural lymph node. Calcified lung nodules also noted. No pleural effusion or thickening. No pericardial effusion. No pathologic mediastinal adenopathy. Low attenuation lesions in the liver measuring up to 2.3 cm likely cysts.  October 2023 CT chest images independently reviewed showing evidence of left hemidiaphragm paralysis with elevation of the left hemidiaphragm.,  Small areas of  bronchiectasis in the left base noted, chronic airway thickening.   BARIUM SWALLOW/ESOPHAGRAM 04/05/16 (per radiologist):  Moderate gastroesophageal reflux. No esophageal dysmotility, stricture or mass.  SNIFF TEST 04/05/16 (per radiologist):  Mild paradoxical movement of the diaphragms with forceful inhalation is consistent with paralysis of the LEFT hemidiaphragm. Chronic elevation of LEFT diaphragm.   LABS 05/27/17 ANA:  Negative   03/29/16 CBC:  8.6/13.8/40.6/232 Eos:  0.6 IgE:  14 RAST Panel:  Negative    November 2023 eosinophil count 300,000 cells per deciliter, IgE 9      Assessment & Plan:   Severe persistent asthma, unspecified whether complicated  Gastroesophageal reflux disease, unspecified whether esophagitis present  Injection education, encounter for  Glaucoma of both eyes, unspecified glaucoma type  Discussion: Cassandra Sims continues to struggle with severe, complicated eye problems as well as severe persistent asthma.  Cassandra Sims works quite well for her however jeopardizes the long-term health of her vision.  For this reason we have used somewhat unorthodox strategy of Dupixent plus as needed Dulera and as needed albuterol.  Based on my conversation with her daughter today I think it would be helpful if I could speak to her multiple ophthalmologists about the risk of ongoing Dulera use because in my mind Cassandra Sims would help her asthma control.  Right now however I think that the risk of vision loss is too high for her to continue routine use of Dulera.  Plan: Severe persistent asthma with dry cough: Continue to use Dulera only as needed for bad days of cough and shortness of breath, ideally no more than 2 to 3 days a week Use albuterol as needed for chest tightness wheezing or shortness of breath Continue Dupixent every other week, we will give an injection today in clinic  Glaucoma/visual changes: I will call your ophthalmologist to discuss the risk of Dulera use and how  we can best balance your asthma and severe visual impairment management.  Dry cough: Hard candies, warm beverages Over-the-counter Delsym as needed  Follow-up 3 months, sooner if needed.  Immunization History  Administered Date(s) Administered   19-influenza Whole 04/10/2017   Influenza Split 04/21/2013   Influenza Whole 04/14/2012   Influenza, High Dose Seasonal PF 03/31/2018   Influenza,inj,Quad PF,6+ Mos 04/30/2013, 04/24/2015, 05/29/2015, 05/22/2016   Influenza,inj,quad, With Preservative 04/22/2014   Influenza-Unspecified 05/10/2021   PFIZER(Purple Top)SARS-COV-2 Vaccination 08/06/2019, 08/27/2019, 05/31/2020   Pfizer Covid-19 Vaccine Bivalent Booster 78yr & up 12/26/2020, 04/17/2021   Pneumococcal Conjugate,unspecified 07/22/2010   Pneumococcal Conjugate-13 12/10/2008, 07/22/2010, 07/22/2010, 05/29/2015   Pneumococcal Polysaccharide-23 12/10/2008, 03/29/2011   Pneumococcal-Unspecified 12/10/2008   Tdap 04/30/2011   Zoster Recombinat (Shingrix) 11/15/2016, 02/04/2017  Zoster, Live 04/30/2011   Zoster, Unspecified 11/15/2016, 02/04/2017      Current Outpatient Medications:    acetaminophen (TYLENOL) 500 MG tablet, Take 2 tablets (1,000 mg total) by mouth every 8 (eight) hours as needed for mild pain., Disp: 30 tablet, Rfl: 0   albuterol (VENTOLIN HFA) 108 (90 Base) MCG/ACT inhaler, INHALE 2 PUFFS INTO THE LUNGS EVERY 4 (FOUR) HOURS AS NEEDED FOR WHEEZING. REPORTED ON 10/10/2015, Disp: 6.7 each, Rfl: 1   amLODipine (NORVASC) 2.5 MG tablet, Take 2.5 mg by mouth 2 (two) times daily., Disp: , Rfl:    Azelastine HCl (ASTEPRO) 0.15 % SOLN, Place 1 spray into the nose at bedtime., Disp: , Rfl:    azithromycin (ZITHROMAX) 250 MG tablet, Take 1 tablet (250 mg total) by mouth as directed., Disp: 6 tablet, Rfl: 0   Calcium Carbonate Antacid (TUMS PO), Take 1,000 mg by mouth daily., Disp: , Rfl:    carboxymethylcellulose (REFRESH PLUS) 0.5 % SOLN, Place 1 drop into both eyes 5 (five)  times daily as needed (for dry eyes)., Disp: , Rfl:    Cholecalciferol 25 MCG (1000 UT) tablet, Take 1,000 Units by mouth daily., Disp: , Rfl:    cyclobenzaprine (FLEXERIL) 5 MG tablet, Take 1 tablet (5 mg total) by mouth 3 (three) times daily as needed for muscle spasms., Disp: 30 tablet, Rfl: 0   dorzolamide (TRUSOPT) 2 % ophthalmic solution, Place 1 drop into both eyes 3 (three) times daily., Disp: , Rfl:    DULERA 100-5 MCG/ACT AERO, INHALE 1 PUFF INTO THE LUNGS DAILY. WITH SPACER AND RINSE MOUTH AFTERWARDS. (Patient taking differently: Inhale 1 puff into the lungs daily.), Disp: 13 each, Rfl: 5   Dupilumab (DUPIXENT) 300 MG/2ML SOPN, Inject 300 mg into the skin every 14 (fourteen) days., Disp: 12 mL, Rfl: 1   escitalopram (LEXAPRO) 5 MG tablet, Take 2.5 mg by mouth at bedtime., Disp: , Rfl:    famotidine (PEPCID) 20 MG tablet, TAKE 1 TABLET BY MOUTH EVERY DAY, Disp: 90 tablet, Rfl: 1   labetalol (NORMODYNE) 100 MG tablet, Take 1 tablet by mouth 2 (two) times daily., Disp: , Rfl:    Lactobacillus (ACIDOPHILUS PO), Take 1 capsule by mouth daily., Disp: , Rfl:    LUTEIN PO, Take 20 mg by mouth daily., Disp: , Rfl:    Multiple Vitamin (MULTIVITAMIN WITH MINERALS) TABS tablet, Take 1 tablet by mouth daily. Nature Made Multi Vitamin for Her 50+ W/No Iron, Disp: , Rfl:    olmesartan (BENICAR) 40 MG tablet, Take 40 mg by mouth every evening., Disp: , Rfl:    psyllium (METAMUCIL) 58.6 % powder, Take 1 packet by mouth daily. 14.3 g (1 heaping tablespoon) every morning., Disp: , Rfl:    ROCKLATAN 0.02-0.005 % SOLN, Place 1 drop into both eyes at bedtime., Disp: , Rfl:    traMADol (ULTRAM) 50 MG tablet, Take 0.5 tablets (25 mg total) by mouth every 8 (eight) hours as needed for severe pain., Disp: 20 tablet, Rfl: 0   Turmeric 450 MG CAPS, Take 450 mg by mouth daily with lunch. , Disp: , Rfl:   Current Facility-Administered Medications:    dupilumab (DUPIXENT) prefilled syringe 300 mg, 300 mg,  Subcutaneous, Q14 Days, Garnet Sierras, DO, 300 mg at 10/11/21 1048

## 2022-09-04 NOTE — Progress Notes (Unsigned)
Patient was seen in office by Dr. Lake Bells for follow up visit.  Patient brought her [place] auto injector for administration.  Patient was nervous and had questions about properly administrating Dupixent. Patient was shown proper injection sites and administration with [place] demo injector.  Patient used demo injector multiple times. Patient daughter was present and involved in learning injection.  Daughter was very supportive to patient. Patient self injected Dupixent in right upper leg. All questions answered.  Demo pen used after injection for additional practice. Advised patient to call if needed with additional questions or problems.     Dupixent-  [place] # - [id number] Lot #- [id number] Expiration date- 06/20/24

## 2022-09-04 NOTE — Patient Instructions (Signed)
Severe persistent asthma with dry cough: Continue to use Dulera only as needed for bad days of cough and shortness of breath, ideally no more than 2 to 3 days a week Use albuterol as needed for chest tightness wheezing or shortness of breath Continue Dupixent every other week, we will give an injection today in clinic  Glaucoma/visual changes: I will call your ophthalmologist to discuss the risk of Dulera use and how we can best balance your asthma and severe visual impairment management.  Dry cough: Hard candies, warm beverages Over-the-counter Delsym as needed  Follow-up 3 months, sooner if needed.

## 2022-09-06 DIAGNOSIS — M79675 Pain in left toe(s): Secondary | ICD-10-CM | POA: Diagnosis not present

## 2022-09-06 DIAGNOSIS — B351 Tinea unguium: Secondary | ICD-10-CM | POA: Diagnosis not present

## 2022-09-06 DIAGNOSIS — M79674 Pain in right toe(s): Secondary | ICD-10-CM | POA: Diagnosis not present

## 2022-09-12 ENCOUNTER — Telehealth: Payer: Self-pay | Admitting: Pulmonary Disease

## 2022-09-13 ENCOUNTER — Telehealth: Payer: Self-pay | Admitting: Pulmonary Disease

## 2022-09-13 ENCOUNTER — Other Ambulatory Visit: Payer: Self-pay

## 2022-09-13 DIAGNOSIS — J4550 Severe persistent asthma, uncomplicated: Secondary | ICD-10-CM

## 2022-09-13 NOTE — Addendum Note (Signed)
Addended by: Valerie Salts on: 09/13/2022 04:01 PM   Modules accepted: Orders

## 2022-09-13 NOTE — Telephone Encounter (Signed)
Spoke with patient she advises she doesn't feel comfortable giving herself the Dupixent injection. She has spoke with her insurance and she is in network with a home health company called [facility] she advises they would send a nurse to her home to give her the injections. She advises her next injection is due Wednesday. Dr. Lake Bells if your okay with this will place the referral

## 2022-09-13 NOTE — Telephone Encounter (Signed)
Called and spoke with patient. She is aware that BQ is ok with the home health order to [facility]. Order has been placed.   While on the phone, she mentioned that she could not remember if she took her BP meds this morning. She wanted to know if she should take them tonight. I advised her it would be best to wait until tomorrow to make sure she doesn't end up taking twice the amount of BP medication. She verbalized understanding and appreciation.   Nothing further needed at time of call.

## 2022-09-13 NOTE — Telephone Encounter (Signed)
Patient is calling to request a referral.  Please call patient to explain further.  CB# [phone]

## 2022-09-13 NOTE — Telephone Encounter (Signed)
Will close this encounter. Already spoke with patient

## 2022-09-16 ENCOUNTER — Telehealth: Payer: Self-pay | Admitting: Pulmonary Disease

## 2022-09-16 NOTE — Telephone Encounter (Signed)
Please advise on message from [place]

## 2022-09-16 NOTE — Telephone Encounter (Signed)
Left message for patient to let her know that I have faxed the order and confirmation received

## 2022-09-16 NOTE — Telephone Encounter (Signed)
Patient is calling to determine the update with her home health order. I see on my side it was sent last week but it says PENDING.   Can we give her an update if possible  Thank you

## 2022-09-16 NOTE — Telephone Encounter (Signed)
Pt is calling about referral to home Health on [address]. States they have not recd the referral. Assured her on 2/23 we sent a referral over. Perhaps she is calling the wrong place? Pls call to advise @ [phone]   [phone] is Home Health Fax #

## 2022-09-17 ENCOUNTER — Telehealth: Payer: Self-pay | Admitting: Pulmonary Disease

## 2022-09-17 NOTE — Telephone Encounter (Signed)
Called and spoke with patient about McQuaids recommendations she verbalized understanding. She states she has tessalon pearls at home and does not need a refill but I told her if she does to call office back. And she states she has a zpak at home. Nothing further needed

## 2022-09-17 NOTE — Telephone Encounter (Signed)
I have sent it to [facility]

## 2022-09-17 NOTE — Telephone Encounter (Signed)
Right chest upper chest are noted at rest. Palative care nurse noted wheezing noted in the same area that pt is having. Dry hacking cough that has been occurring for about 6 weeks now.   Ruthe Mannan is helping some with the cough. Patient states that last time that Dr Lake Bells sent in a zpak.   Please advise sir

## 2022-09-17 NOTE — Telephone Encounter (Signed)
New encounter, new issue: PT said home health nurse can hear wheezing in her RT lung and she is coughing. Last time Dr. Lake Bells sent in a [place]. She also states she has some pain in her rt chest area. Pls  call to advise @ [phone]. Check open encounter before calling.    PHARM: [facility] in [place]

## 2022-09-17 NOTE — Telephone Encounter (Signed)
[facility] will contact the patient to do the injections

## 2022-09-17 NOTE — Telephone Encounter (Signed)
Can we send in home health to another company??  Please advise

## 2022-09-18 DIAGNOSIS — M1990 Unspecified osteoarthritis, unspecified site: Secondary | ICD-10-CM | POA: Diagnosis not present

## 2022-09-18 DIAGNOSIS — Z792 Long term (current) use of antibiotics: Secondary | ICD-10-CM | POA: Diagnosis not present

## 2022-09-18 DIAGNOSIS — J4550 Severe persistent asthma, uncomplicated: Secondary | ICD-10-CM | POA: Diagnosis not present

## 2022-09-18 DIAGNOSIS — F419 Anxiety disorder, unspecified: Secondary | ICD-10-CM | POA: Diagnosis not present

## 2022-09-18 DIAGNOSIS — G43909 Migraine, unspecified, not intractable, without status migrainosus: Secondary | ICD-10-CM | POA: Diagnosis not present

## 2022-09-18 DIAGNOSIS — H409 Unspecified glaucoma: Secondary | ICD-10-CM | POA: Diagnosis not present

## 2022-09-18 DIAGNOSIS — J310 Chronic rhinitis: Secondary | ICD-10-CM | POA: Diagnosis not present

## 2022-09-18 DIAGNOSIS — R911 Solitary pulmonary nodule: Secondary | ICD-10-CM | POA: Diagnosis not present

## 2022-09-18 DIAGNOSIS — H9190 Unspecified hearing loss, unspecified ear: Secondary | ICD-10-CM | POA: Diagnosis not present

## 2022-09-18 DIAGNOSIS — I10 Essential (primary) hypertension: Secondary | ICD-10-CM | POA: Diagnosis not present

## 2022-09-18 DIAGNOSIS — I739 Peripheral vascular disease, unspecified: Secondary | ICD-10-CM | POA: Diagnosis not present

## 2022-09-18 DIAGNOSIS — R7303 Prediabetes: Secondary | ICD-10-CM | POA: Diagnosis not present

## 2022-09-18 DIAGNOSIS — Z7951 Long term (current) use of inhaled steroids: Secondary | ICD-10-CM | POA: Diagnosis not present

## 2022-09-18 DIAGNOSIS — K219 Gastro-esophageal reflux disease without esophagitis: Secondary | ICD-10-CM | POA: Diagnosis not present

## 2022-09-18 NOTE — Telephone Encounter (Signed)
Inj will be done by [facility]

## 2022-09-19 DIAGNOSIS — I10 Essential (primary) hypertension: Secondary | ICD-10-CM | POA: Diagnosis not present

## 2022-09-24 DIAGNOSIS — F419 Anxiety disorder, unspecified: Secondary | ICD-10-CM | POA: Diagnosis not present

## 2022-09-24 DIAGNOSIS — Z792 Long term (current) use of antibiotics: Secondary | ICD-10-CM | POA: Diagnosis not present

## 2022-09-24 DIAGNOSIS — H9190 Unspecified hearing loss, unspecified ear: Secondary | ICD-10-CM | POA: Diagnosis not present

## 2022-09-24 DIAGNOSIS — G43909 Migraine, unspecified, not intractable, without status migrainosus: Secondary | ICD-10-CM | POA: Diagnosis not present

## 2022-09-24 DIAGNOSIS — M1990 Unspecified osteoarthritis, unspecified site: Secondary | ICD-10-CM | POA: Diagnosis not present

## 2022-09-24 DIAGNOSIS — J310 Chronic rhinitis: Secondary | ICD-10-CM | POA: Diagnosis not present

## 2022-09-24 DIAGNOSIS — R911 Solitary pulmonary nodule: Secondary | ICD-10-CM | POA: Diagnosis not present

## 2022-09-24 DIAGNOSIS — H409 Unspecified glaucoma: Secondary | ICD-10-CM | POA: Diagnosis not present

## 2022-09-24 DIAGNOSIS — I10 Essential (primary) hypertension: Secondary | ICD-10-CM | POA: Diagnosis not present

## 2022-09-24 DIAGNOSIS — I739 Peripheral vascular disease, unspecified: Secondary | ICD-10-CM | POA: Diagnosis not present

## 2022-09-24 DIAGNOSIS — J4550 Severe persistent asthma, uncomplicated: Secondary | ICD-10-CM | POA: Diagnosis not present

## 2022-09-24 DIAGNOSIS — Z7951 Long term (current) use of inhaled steroids: Secondary | ICD-10-CM | POA: Diagnosis not present

## 2022-09-24 DIAGNOSIS — K219 Gastro-esophageal reflux disease without esophagitis: Secondary | ICD-10-CM | POA: Diagnosis not present

## 2022-09-24 DIAGNOSIS — R7303 Prediabetes: Secondary | ICD-10-CM | POA: Diagnosis not present

## 2022-10-01 DIAGNOSIS — J4550 Severe persistent asthma, uncomplicated: Secondary | ICD-10-CM | POA: Diagnosis not present

## 2022-10-01 DIAGNOSIS — F419 Anxiety disorder, unspecified: Secondary | ICD-10-CM | POA: Diagnosis not present

## 2022-10-01 DIAGNOSIS — H9190 Unspecified hearing loss, unspecified ear: Secondary | ICD-10-CM | POA: Diagnosis not present

## 2022-10-01 DIAGNOSIS — Z7951 Long term (current) use of inhaled steroids: Secondary | ICD-10-CM | POA: Diagnosis not present

## 2022-10-01 DIAGNOSIS — R7303 Prediabetes: Secondary | ICD-10-CM | POA: Diagnosis not present

## 2022-10-01 DIAGNOSIS — I739 Peripheral vascular disease, unspecified: Secondary | ICD-10-CM | POA: Diagnosis not present

## 2022-10-01 DIAGNOSIS — J310 Chronic rhinitis: Secondary | ICD-10-CM | POA: Diagnosis not present

## 2022-10-01 DIAGNOSIS — K219 Gastro-esophageal reflux disease without esophagitis: Secondary | ICD-10-CM | POA: Diagnosis not present

## 2022-10-01 DIAGNOSIS — I10 Essential (primary) hypertension: Secondary | ICD-10-CM | POA: Diagnosis not present

## 2022-10-01 DIAGNOSIS — M1990 Unspecified osteoarthritis, unspecified site: Secondary | ICD-10-CM | POA: Diagnosis not present

## 2022-10-01 DIAGNOSIS — R911 Solitary pulmonary nodule: Secondary | ICD-10-CM | POA: Diagnosis not present

## 2022-10-01 DIAGNOSIS — Z792 Long term (current) use of antibiotics: Secondary | ICD-10-CM | POA: Diagnosis not present

## 2022-10-01 DIAGNOSIS — H409 Unspecified glaucoma: Secondary | ICD-10-CM | POA: Diagnosis not present

## 2022-10-01 DIAGNOSIS — G43909 Migraine, unspecified, not intractable, without status migrainosus: Secondary | ICD-10-CM | POA: Diagnosis not present

## 2022-10-02 DIAGNOSIS — H401132 Primary open-angle glaucoma, bilateral, moderate stage: Secondary | ICD-10-CM | POA: Diagnosis not present

## 2022-10-08 ENCOUNTER — Telehealth: Payer: Self-pay | Admitting: Pulmonary Disease

## 2022-10-08 DIAGNOSIS — R911 Solitary pulmonary nodule: Secondary | ICD-10-CM | POA: Diagnosis not present

## 2022-10-08 DIAGNOSIS — G43909 Migraine, unspecified, not intractable, without status migrainosus: Secondary | ICD-10-CM | POA: Diagnosis not present

## 2022-10-08 DIAGNOSIS — Z792 Long term (current) use of antibiotics: Secondary | ICD-10-CM | POA: Diagnosis not present

## 2022-10-08 DIAGNOSIS — H9190 Unspecified hearing loss, unspecified ear: Secondary | ICD-10-CM | POA: Diagnosis not present

## 2022-10-08 DIAGNOSIS — Z7951 Long term (current) use of inhaled steroids: Secondary | ICD-10-CM | POA: Diagnosis not present

## 2022-10-08 DIAGNOSIS — M1990 Unspecified osteoarthritis, unspecified site: Secondary | ICD-10-CM | POA: Diagnosis not present

## 2022-10-08 DIAGNOSIS — I10 Essential (primary) hypertension: Secondary | ICD-10-CM | POA: Diagnosis not present

## 2022-10-08 DIAGNOSIS — H409 Unspecified glaucoma: Secondary | ICD-10-CM | POA: Diagnosis not present

## 2022-10-08 DIAGNOSIS — I739 Peripheral vascular disease, unspecified: Secondary | ICD-10-CM | POA: Diagnosis not present

## 2022-10-08 DIAGNOSIS — J4550 Severe persistent asthma, uncomplicated: Secondary | ICD-10-CM | POA: Diagnosis not present

## 2022-10-08 DIAGNOSIS — R7303 Prediabetes: Secondary | ICD-10-CM | POA: Diagnosis not present

## 2022-10-08 DIAGNOSIS — F419 Anxiety disorder, unspecified: Secondary | ICD-10-CM | POA: Diagnosis not present

## 2022-10-08 DIAGNOSIS — J310 Chronic rhinitis: Secondary | ICD-10-CM | POA: Diagnosis not present

## 2022-10-08 DIAGNOSIS — K219 Gastro-esophageal reflux disease without esophagitis: Secondary | ICD-10-CM | POA: Diagnosis not present

## 2022-10-08 NOTE — Telephone Encounter (Signed)
Called Cassandra Sims to let her know Dr Lisabeth Pick recommendations and she verbalized understanding. She states that she will come in tomorrow with Dr Shearon Stalls. Nothing further needed

## 2022-10-08 NOTE — Telephone Encounter (Signed)
Cassandra Sims states patient having symptoms of shortness of breath, cough and fatigue. Pharmacy is [facility]. Cassandra Sims phone number is [phone] and  [name] phone number is [phone].

## 2022-10-08 NOTE — Telephone Encounter (Signed)
I would recommend if symptoms are severe enough and different that previously she should go to the ER for further evaluation. If the symptoms are similar to what has been discussed with Dr. Lake Bells, then I would wait until she sees Dr. Shearon Stalls in the morning.  Thanks, JD

## 2022-10-08 NOTE — Telephone Encounter (Signed)
Called home health nurse and she states that patient has noted some SOB that is new and it started around the time of her new injections with  Dupixient.   Coughing and fatigue noted as well since starting new medication  Blurry vision  Gate is off balance   Patient has appt in the morning at 10am.   Please advise

## 2022-10-09 ENCOUNTER — Ambulatory Visit: Payer: Medicare Other | Admitting: Internal Medicine

## 2022-10-09 ENCOUNTER — Encounter: Payer: Self-pay | Admitting: Internal Medicine

## 2022-10-09 VITALS — BP 120/72 | HR 56 | Temp 97.7°F | Ht 62.5 in | Wt 111.0 lb

## 2022-10-09 DIAGNOSIS — R42 Dizziness and giddiness: Secondary | ICD-10-CM

## 2022-10-09 DIAGNOSIS — K219 Gastro-esophageal reflux disease without esophagitis: Secondary | ICD-10-CM

## 2022-10-09 DIAGNOSIS — R053 Chronic cough: Secondary | ICD-10-CM | POA: Diagnosis not present

## 2022-10-09 DIAGNOSIS — J4550 Severe persistent asthma, uncomplicated: Secondary | ICD-10-CM

## 2022-10-09 MED ORDER — BENZONATATE 100 MG PO CAPS: 100.0000 mg | ORAL_CAPSULE | Freq: Three times a day (TID) | ORAL | 0 refills | Status: AC | PRN

## 2022-10-09 NOTE — Progress Notes (Signed)
Cassandra Sims    ZN:[id number]    09-16-1932  Primary Care Physician:Clark, Nettie Elm, MD Date of Appointment: 10/09/2022 Established Patient Visit  Chief complaint:   Chief Complaint  Patient presents with   Follow-up    Coughing (dry) covid negative. Sob,dizziness, blurred vision, occ chest pain, questions and concerns of dupixent      HPI: Cassandra Sims is a 89 y.o. woman with severe persistent asthma on dupixent, glaucoma, gerd, and left hemidiaphragm paralysis as well as chronic rhinitis. Previously seen by Dr. Lake Bells.  Started dupixent late 2023.   Interval Updates: Here for evaluation of long standing dizziness, off balance, blurry vision and shortness of breath.  She also has glaucoma. Blood pressure medicines also increased to help with control of HTN due to history of inoperable aneurysm under monitoring.   She had an acute episode of dizziness and feeling off balance and her home health nurse was concerned this was a dupixent side effect which is why she made the appointment.   She says she is not taking the tramadol, lexapro or flexeril anymore.  Dr. Gerline Legacy is PCP.   On dupixent since Nov 2023.  Has not had bronchitis or needed steroids since starting dupixent.   Current Regimen: dulera 100 1 puff twice a day, albuterol prn, dupixent Asthma Triggers: Exacerbations in the last year: History of hospitalization or intubation: Allergy Testing: GERD: Allergic Rhinitis: ACT:  Asthma Control Test ACT Total Score  01/31/2021  4:00 PM 19  11/24/2020 10:00 AM 21  09/27/2020 12:00 PM 17   FeNO:  I have reviewed the patient's family social and past medical history and updated as appropriate.   Past Medical History:  Diagnosis Date   Anxiety    Arthritis    Asthma    Complication of anesthesia    hard to wake up, 2009  sit up in bed to fast and BP dropped low code blue called per pt.   Family history of adverse reaction to anesthesia     sister has naseau   GERD (gastroesophageal reflux disease)    Glaucoma    Headache    hx of migrains   HOH (hard of hearing)    Hypertension    Hypoglycemia    Multiple allergies    Peripheral vascular disease ([place])    varicose veins , Laser vein surgery   PONV (postoperative nausea and vomiting)    Pre-diabetes    Prolonged QT interval 01/13/2018   Rhinitis    S/P knee replacement 01/13/2018   Wears glasses     Past Surgical History:  Procedure Laterality Date   ABDOMINAL HYSTERECTOMY     APPENDECTOMY     BREAST SURGERY     biopsy left    COLECTOMY  2008   obstruction   DIAGNOSTIC LAPAROSCOPY     exp   DILATION AND CURETTAGE OF UTERUS     EYE SURGERY     cataract   bil and retina surgery right eye, glaucoma surgery, right eye   FUNCTIONAL ENDOSCOPIC SINUS SURGERY  2009   bilat ethm,frontal,max   KNEE ARTHROSCOPY Right 03/30/2018   Procedure: right knee scope; synovectomy;  Surgeon: Gaynelle Arabian, MD;  Location: [facility];  Service: Orthopedics;  Laterality: Right;  23min   NASAL SINUS SURGERY Bilateral 10/26/2012   Procedure: BILATERAL ENDOSCOPIC REVISION ETHMOID MAXILLARY AND FRONTAL SINUS SURGERY  ;  Surgeon: Izora Gala, MD;  Location: [facility];  Service: ENT;  Laterality: Bilateral;   OVARIAN CYST SURGERY     TOTAL KNEE ARTHROPLASTY Right 01/05/2018   Procedure: RIGHT TOTAL KNEE ARTHROPLASTY;  Surgeon: Gaynelle Arabian, MD;  Location: [facility];  Service: Orthopedics;  Laterality: Right;   TUBAL LIGATION     URETER SURGERY     cut accidentally during exp lap-ovarien cyst   VEIN LIGATION      Family History  Problem Relation Age of Onset   Glaucoma Mother    Arthritis Mother    Lung disease Sister    Breast cancer Daughter    Diabetes Mellitus I Other    Leukemia Brother    Allergic rhinitis Neg Hx    Angioedema Neg Hx    Asthma Neg Hx    Atopy Neg Hx    Eczema Neg Hx    Urticaria Neg Hx    Immunodeficiency Neg Hx     Social History    Occupational History   Not on file  Tobacco Use   Smoking status: Never   Smokeless tobacco: Never  Vaping Use   Vaping Use: Never used  Substance and Sexual Activity   Alcohol use: No   Drug use: No   Sexual activity: Not on file     Physical Exam: Blood pressure 120/72, pulse (!) 56, temperature 97.7 F (36.5 C), temperature source Oral, height 5' 2.5" (1.588 m), weight 111 lb (50.3 kg), SpO2 98 %.  Gen:      No acute distress, elderly ENT:  cobblestoning in oropharynx, no nasal polyps, mucus membranes moist Lungs:    No increased respiratory effort, symmetric chest wall excursion, clear to auscultation bilaterally, no wheezes or crackles CV:         Regular rate and rhythm; no murmurs, rubs, or gallops.  No pedal edema   Data Reviewed: Imaging: I have personally reviewed the CT Chest October 2023 - LLL atelectasis with elevated left hemidiaphragm. Mild peribronchial thickening.   PFTs:     Latest Ref Rng & Units 06/06/2016    3:07 PM  PFT Results  FVC-Pre L 2.16   FVC-Predicted Pre % 88   FVC-Post L 2.11   FVC-Predicted Post % 86   Pre FEV1/FVC % % 70   Post FEV1/FCV % % 77   FEV1-Pre L 1.52   FEV1-Predicted Pre % 84   FEV1-Post L 1.62   DLCO uncorrected ml/min/mmHg 18.08   DLCO UNC% % 78   DLCO corrected ml/min/mmHg 17.24   DLCO COR %Predicted % 75   DLVA Predicted % 94   TLC L 4.43   TLC % Predicted % 90   RV % Predicted % 93    I have personally reviewed the patient's PFTs and mild airflow limitation without significant response to BD.   Labs: IgE 9 in Nov 2023 In Sept 2017 region 2 IgE panel negative with total IgE 14  Immunization status: Immunization History  Administered Date(s) Administered   19-influenza Whole 04/10/2017   Influenza Split 04/21/2013   Influenza Whole 04/14/2012   Influenza, High Dose Seasonal PF 03/31/2018   Influenza,inj,Quad PF,6+ Mos 04/30/2013, 04/24/2015, 05/29/2015, 05/22/2016   Influenza,inj,quad, With  Preservative 04/22/2014   Influenza-Unspecified 05/10/2021   PFIZER(Purple Top)SARS-COV-2 Vaccination 08/06/2019, 08/27/2019, 05/31/2020   Pfizer Covid-19 Vaccine Bivalent Booster 71yrs & up 12/26/2020, 04/17/2021   Pneumococcal Conjugate,unspecified 07/22/2010   Pneumococcal Conjugate-13 12/10/2008, 07/22/2010, 07/22/2010, 05/29/2015   Pneumococcal Polysaccharide-23 12/10/2008, 03/29/2011   Pneumococcal-Unspecified 12/10/2008   Tdap 04/30/2011   Zoster Recombinat (Shingrix)  11/15/2016, 02/04/2017   Zoster, Live 04/30/2011   Zoster, Unspecified 11/15/2016, 02/04/2017    External Records Personally Reviewed: pulmonary  Assessment:  Severe persistent asthma on dupixent Dizzness and gait instability Blurry vision GERD Chronic Cough - likely multifactorial from GERD, rhinitis  Plan/Recommendations: If you have more episodes of dizziness and instability, please check your blood pressure, heart rate and oxygen level and write those down.   Please take a blood pressure log to your primary care office visit to help them determine if any changes need to be made.   I think it is very unlikely that your symptoms are related to the dupixent. The blood pressure medicines and your eye condition are more likely to be contributing. However if you'd like to stop the dupixent for a month and see if the symptoms resolve, that is fine with me. Just send me a mychart message and let me know.   Continue dulera at current dose.  Continue pepcid for reflux. Tessalon perles for cough sent to your pharmacy.   Agree with follow up with ophtho for blurry vision - hard to know if this related to one of her multiple medications.    I spent 35 minutes on 10/09/2022 in care of this patient including face to face time and non-face to face time spent charting, review of outside records, and coordination of care.   Return to Care: Return in about 4 months (around 02/08/2023).   Lenice Llamas, MD Pulmonary and  [place]

## 2022-10-09 NOTE — Patient Instructions (Addendum)
Please schedule follow up scheduled with myself in 4 months.  If my schedule is not open yet, we will contact you with a reminder closer to that time. Please call [phone] if you haven't heard from [place] a month before.   If you have more episodes of dizziness and instability, please check your blood pressure, heart rate and oxygen level and write those down.  Please take a blood pressure log to your primary care office visit to help them determine if any changes need to be made.   I think it is very unlikely that your symptoms are related to the dupixent. The blood pressure medicines and your eye condition are more likely to be contributing. However if you'd like to stop the dupixent for a month and see if the symptoms resolve, that is fine with me. Just send me a mychart message and let me know.   Continue dulera at current dose.  Continue pepcid for reflux. Tessalon perles for cough sent to your pharmacy.

## 2022-10-15 DIAGNOSIS — J310 Chronic rhinitis: Secondary | ICD-10-CM | POA: Diagnosis not present

## 2022-10-15 DIAGNOSIS — R7303 Prediabetes: Secondary | ICD-10-CM | POA: Diagnosis not present

## 2022-10-15 DIAGNOSIS — K219 Gastro-esophageal reflux disease without esophagitis: Secondary | ICD-10-CM | POA: Diagnosis not present

## 2022-10-15 DIAGNOSIS — I10 Essential (primary) hypertension: Secondary | ICD-10-CM | POA: Diagnosis not present

## 2022-10-15 DIAGNOSIS — J4550 Severe persistent asthma, uncomplicated: Secondary | ICD-10-CM | POA: Diagnosis not present

## 2022-10-15 DIAGNOSIS — Z7951 Long term (current) use of inhaled steroids: Secondary | ICD-10-CM | POA: Diagnosis not present

## 2022-10-15 DIAGNOSIS — H9190 Unspecified hearing loss, unspecified ear: Secondary | ICD-10-CM | POA: Diagnosis not present

## 2022-10-15 DIAGNOSIS — G43909 Migraine, unspecified, not intractable, without status migrainosus: Secondary | ICD-10-CM | POA: Diagnosis not present

## 2022-10-15 DIAGNOSIS — Z792 Long term (current) use of antibiotics: Secondary | ICD-10-CM | POA: Diagnosis not present

## 2022-10-15 DIAGNOSIS — H409 Unspecified glaucoma: Secondary | ICD-10-CM | POA: Diagnosis not present

## 2022-10-15 DIAGNOSIS — R911 Solitary pulmonary nodule: Secondary | ICD-10-CM | POA: Diagnosis not present

## 2022-10-15 DIAGNOSIS — F419 Anxiety disorder, unspecified: Secondary | ICD-10-CM | POA: Diagnosis not present

## 2022-10-15 DIAGNOSIS — M1990 Unspecified osteoarthritis, unspecified site: Secondary | ICD-10-CM | POA: Diagnosis not present

## 2022-10-15 DIAGNOSIS — I739 Peripheral vascular disease, unspecified: Secondary | ICD-10-CM | POA: Diagnosis not present

## 2022-10-18 ENCOUNTER — Telehealth: Payer: Self-pay | Admitting: Internal Medicine

## 2022-10-18 DIAGNOSIS — I10 Essential (primary) hypertension: Secondary | ICD-10-CM | POA: Diagnosis not present

## 2022-10-18 DIAGNOSIS — I739 Peripheral vascular disease, unspecified: Secondary | ICD-10-CM | POA: Diagnosis not present

## 2022-10-18 DIAGNOSIS — J310 Chronic rhinitis: Secondary | ICD-10-CM | POA: Diagnosis not present

## 2022-10-18 DIAGNOSIS — J4550 Severe persistent asthma, uncomplicated: Secondary | ICD-10-CM | POA: Diagnosis not present

## 2022-10-18 DIAGNOSIS — G43909 Migraine, unspecified, not intractable, without status migrainosus: Secondary | ICD-10-CM | POA: Diagnosis not present

## 2022-10-18 DIAGNOSIS — R911 Solitary pulmonary nodule: Secondary | ICD-10-CM | POA: Diagnosis not present

## 2022-10-18 DIAGNOSIS — Z792 Long term (current) use of antibiotics: Secondary | ICD-10-CM | POA: Diagnosis not present

## 2022-10-18 DIAGNOSIS — R7303 Prediabetes: Secondary | ICD-10-CM | POA: Diagnosis not present

## 2022-10-18 DIAGNOSIS — Z7951 Long term (current) use of inhaled steroids: Secondary | ICD-10-CM | POA: Diagnosis not present

## 2022-10-18 DIAGNOSIS — M1990 Unspecified osteoarthritis, unspecified site: Secondary | ICD-10-CM | POA: Diagnosis not present

## 2022-10-18 DIAGNOSIS — K219 Gastro-esophageal reflux disease without esophagitis: Secondary | ICD-10-CM | POA: Diagnosis not present

## 2022-10-18 DIAGNOSIS — H9190 Unspecified hearing loss, unspecified ear: Secondary | ICD-10-CM | POA: Diagnosis not present

## 2022-10-18 DIAGNOSIS — H409 Unspecified glaucoma: Secondary | ICD-10-CM | POA: Diagnosis not present

## 2022-10-18 DIAGNOSIS — F419 Anxiety disorder, unspecified: Secondary | ICD-10-CM | POA: Diagnosis not present

## 2022-10-18 NOTE — Telephone Encounter (Signed)
Patient was previously established with Dr. Lake Bells for asthma. She receives in home nursing support to help administer her Dupixent injections every 2 weeks. Received re-certification papers from [facility] but they have Dr. Anastasia Pall name and information listed for him to sign.   Patient established care with Dr. Shearon Stalls on 10/09/22.   Dr. Shearon Stalls, are you ok with taking over her home health orders for the [facility]?

## 2022-10-21 NOTE — Telephone Encounter (Signed)
I have the forms. Will call [facility] this afternoon to have the name changed on the paperwork.

## 2022-10-21 NOTE — Telephone Encounter (Signed)
Cherina,  Do you happen to still have this paperwork? Or is it in [place] old cabinet?  thanks

## 2022-10-22 ENCOUNTER — Other Ambulatory Visit: Payer: Self-pay | Admitting: Pharmacist

## 2022-10-22 DIAGNOSIS — I739 Peripheral vascular disease, unspecified: Secondary | ICD-10-CM | POA: Diagnosis not present

## 2022-10-22 DIAGNOSIS — F419 Anxiety disorder, unspecified: Secondary | ICD-10-CM | POA: Diagnosis not present

## 2022-10-22 DIAGNOSIS — J4550 Severe persistent asthma, uncomplicated: Secondary | ICD-10-CM

## 2022-10-22 DIAGNOSIS — H409 Unspecified glaucoma: Secondary | ICD-10-CM | POA: Diagnosis not present

## 2022-10-22 DIAGNOSIS — R911 Solitary pulmonary nodule: Secondary | ICD-10-CM | POA: Diagnosis not present

## 2022-10-22 DIAGNOSIS — Z7951 Long term (current) use of inhaled steroids: Secondary | ICD-10-CM | POA: Diagnosis not present

## 2022-10-22 DIAGNOSIS — I10 Essential (primary) hypertension: Secondary | ICD-10-CM | POA: Diagnosis not present

## 2022-10-22 DIAGNOSIS — M810 Age-related osteoporosis without current pathological fracture: Secondary | ICD-10-CM | POA: Diagnosis not present

## 2022-10-22 DIAGNOSIS — M1990 Unspecified osteoarthritis, unspecified site: Secondary | ICD-10-CM | POA: Diagnosis not present

## 2022-10-22 DIAGNOSIS — R7303 Prediabetes: Secondary | ICD-10-CM | POA: Diagnosis not present

## 2022-10-22 DIAGNOSIS — G43909 Migraine, unspecified, not intractable, without status migrainosus: Secondary | ICD-10-CM | POA: Diagnosis not present

## 2022-10-22 DIAGNOSIS — K219 Gastro-esophageal reflux disease without esophagitis: Secondary | ICD-10-CM | POA: Diagnosis not present

## 2022-10-22 DIAGNOSIS — H9190 Unspecified hearing loss, unspecified ear: Secondary | ICD-10-CM | POA: Diagnosis not present

## 2022-10-22 DIAGNOSIS — J310 Chronic rhinitis: Secondary | ICD-10-CM | POA: Diagnosis not present

## 2022-10-22 DIAGNOSIS — Z792 Long term (current) use of antibiotics: Secondary | ICD-10-CM | POA: Diagnosis not present

## 2022-10-22 MED ORDER — DUPIXENT 300 MG/2ML ~~LOC~~ SOAJ: 300.0000 mg | SUBCUTANEOUS | 1 refills | Status: DC

## 2022-10-22 NOTE — Telephone Encounter (Signed)
Refill sent for DUPIXENT to [facility]: [phone]  Dose: 300 mg SQ every 2 weeks  Last OV: 10/09/2022 Provider: Dr. Shearon Stalls (previously Dr. Lake Bells)  Next OV: 4 months (not yet scheduled)  Knox Saliva, PharmD, MPH, BCPS Clinical Pharmacist (Rheumatology and Pulmonology)

## 2022-10-25 DIAGNOSIS — R7303 Prediabetes: Secondary | ICD-10-CM | POA: Diagnosis not present

## 2022-10-25 DIAGNOSIS — I10 Essential (primary) hypertension: Secondary | ICD-10-CM | POA: Diagnosis not present

## 2022-10-25 DIAGNOSIS — I160 Hypertensive urgency: Secondary | ICD-10-CM | POA: Diagnosis not present

## 2022-10-28 NOTE — Telephone Encounter (Signed)
Called and spoke with Cordelia Pen at [facility]. She has updated the patient's information and their system.  Nothing further needed at time of call.

## 2022-10-29 DIAGNOSIS — F419 Anxiety disorder, unspecified: Secondary | ICD-10-CM | POA: Diagnosis not present

## 2022-10-29 DIAGNOSIS — K219 Gastro-esophageal reflux disease without esophagitis: Secondary | ICD-10-CM | POA: Diagnosis not present

## 2022-10-29 DIAGNOSIS — I739 Peripheral vascular disease, unspecified: Secondary | ICD-10-CM | POA: Diagnosis not present

## 2022-10-29 DIAGNOSIS — H9190 Unspecified hearing loss, unspecified ear: Secondary | ICD-10-CM | POA: Diagnosis not present

## 2022-10-29 DIAGNOSIS — H409 Unspecified glaucoma: Secondary | ICD-10-CM | POA: Diagnosis not present

## 2022-10-29 DIAGNOSIS — G43909 Migraine, unspecified, not intractable, without status migrainosus: Secondary | ICD-10-CM | POA: Diagnosis not present

## 2022-10-29 DIAGNOSIS — Z7951 Long term (current) use of inhaled steroids: Secondary | ICD-10-CM | POA: Diagnosis not present

## 2022-10-29 DIAGNOSIS — J310 Chronic rhinitis: Secondary | ICD-10-CM | POA: Diagnosis not present

## 2022-10-29 DIAGNOSIS — M1990 Unspecified osteoarthritis, unspecified site: Secondary | ICD-10-CM | POA: Diagnosis not present

## 2022-10-29 DIAGNOSIS — R7303 Prediabetes: Secondary | ICD-10-CM | POA: Diagnosis not present

## 2022-10-29 DIAGNOSIS — I10 Essential (primary) hypertension: Secondary | ICD-10-CM | POA: Diagnosis not present

## 2022-10-29 DIAGNOSIS — J4550 Severe persistent asthma, uncomplicated: Secondary | ICD-10-CM | POA: Diagnosis not present

## 2022-10-29 DIAGNOSIS — R911 Solitary pulmonary nodule: Secondary | ICD-10-CM | POA: Diagnosis not present

## 2022-10-29 DIAGNOSIS — Z792 Long term (current) use of antibiotics: Secondary | ICD-10-CM | POA: Diagnosis not present

## 2022-11-05 DIAGNOSIS — K219 Gastro-esophageal reflux disease without esophagitis: Secondary | ICD-10-CM | POA: Diagnosis not present

## 2022-11-05 DIAGNOSIS — F419 Anxiety disorder, unspecified: Secondary | ICD-10-CM | POA: Diagnosis not present

## 2022-11-05 DIAGNOSIS — M1990 Unspecified osteoarthritis, unspecified site: Secondary | ICD-10-CM | POA: Diagnosis not present

## 2022-11-05 DIAGNOSIS — J310 Chronic rhinitis: Secondary | ICD-10-CM | POA: Diagnosis not present

## 2022-11-05 DIAGNOSIS — H9190 Unspecified hearing loss, unspecified ear: Secondary | ICD-10-CM | POA: Diagnosis not present

## 2022-11-05 DIAGNOSIS — H409 Unspecified glaucoma: Secondary | ICD-10-CM | POA: Diagnosis not present

## 2022-11-05 DIAGNOSIS — I739 Peripheral vascular disease, unspecified: Secondary | ICD-10-CM | POA: Diagnosis not present

## 2022-11-05 DIAGNOSIS — G43909 Migraine, unspecified, not intractable, without status migrainosus: Secondary | ICD-10-CM | POA: Diagnosis not present

## 2022-11-05 DIAGNOSIS — Z792 Long term (current) use of antibiotics: Secondary | ICD-10-CM | POA: Diagnosis not present

## 2022-11-05 DIAGNOSIS — I10 Essential (primary) hypertension: Secondary | ICD-10-CM | POA: Diagnosis not present

## 2022-11-05 DIAGNOSIS — J4550 Severe persistent asthma, uncomplicated: Secondary | ICD-10-CM | POA: Diagnosis not present

## 2022-11-05 DIAGNOSIS — R7303 Prediabetes: Secondary | ICD-10-CM | POA: Diagnosis not present

## 2022-11-05 DIAGNOSIS — Z7951 Long term (current) use of inhaled steroids: Secondary | ICD-10-CM | POA: Diagnosis not present

## 2022-11-05 DIAGNOSIS — R911 Solitary pulmonary nodule: Secondary | ICD-10-CM | POA: Diagnosis not present

## 2022-11-07 DIAGNOSIS — R0600 Dyspnea, unspecified: Secondary | ICD-10-CM | POA: Diagnosis not present

## 2022-11-07 DIAGNOSIS — Z1330 Encounter for screening examination for mental health and behavioral disorders, unspecified: Secondary | ICD-10-CM | POA: Diagnosis not present

## 2022-11-07 DIAGNOSIS — K080 Exfoliation of teeth due to systemic causes: Secondary | ICD-10-CM | POA: Diagnosis not present

## 2022-11-07 DIAGNOSIS — R002 Palpitations: Secondary | ICD-10-CM | POA: Diagnosis not present

## 2022-11-07 DIAGNOSIS — I10 Essential (primary) hypertension: Secondary | ICD-10-CM | POA: Diagnosis not present

## 2022-11-07 DIAGNOSIS — I2510 Atherosclerotic heart disease of native coronary artery without angina pectoris: Secondary | ICD-10-CM | POA: Diagnosis not present

## 2022-11-11 ENCOUNTER — Emergency Department (HOSPITAL_BASED_OUTPATIENT_CLINIC_OR_DEPARTMENT_OTHER): Payer: Medicare Other

## 2022-11-11 ENCOUNTER — Other Ambulatory Visit: Payer: Self-pay

## 2022-11-11 ENCOUNTER — Emergency Department (HOSPITAL_BASED_OUTPATIENT_CLINIC_OR_DEPARTMENT_OTHER)
Admission: EM | Admit: 2022-11-11 | Discharge: 2022-11-12 | Disposition: A | Discharge status: Home / Self Care | Payer: Medicare Other | Attending: Emergency Medicine | Admitting: Emergency Medicine

## 2022-11-11 ENCOUNTER — Encounter (HOSPITAL_BASED_OUTPATIENT_CLINIC_OR_DEPARTMENT_OTHER): Payer: Self-pay | Admitting: Urology

## 2022-11-11 DIAGNOSIS — I10 Essential (primary) hypertension: Secondary | ICD-10-CM | POA: Diagnosis not present

## 2022-11-11 DIAGNOSIS — I6522 Occlusion and stenosis of left carotid artery: Secondary | ICD-10-CM | POA: Diagnosis not present

## 2022-11-11 DIAGNOSIS — Z79899 Other long term (current) drug therapy: Secondary | ICD-10-CM | POA: Diagnosis not present

## 2022-11-11 DIAGNOSIS — R079 Chest pain, unspecified: Secondary | ICD-10-CM | POA: Diagnosis not present

## 2022-11-11 DIAGNOSIS — I160 Hypertensive urgency: Secondary | ICD-10-CM

## 2022-11-11 DIAGNOSIS — R519 Headache, unspecified: Secondary | ICD-10-CM | POA: Diagnosis not present

## 2022-11-11 LAB — CBC
HCT: 40.8 % (ref 36.0–46.0)
Hemoglobin: 13.6 g/dL (ref 12.0–15.0)
MCH: 31 pg (ref 26.0–34.0)
MCHC: 33.3 g/dL (ref 30.0–36.0)
MCV: 92.9 fL (ref 80.0–100.0)
Platelets: 187 10*3/uL (ref 150–400)
RBC: 4.39 MIL/uL (ref 3.87–5.11)
RDW: 13.8 % (ref 11.5–15.5)
WBC: 5.9 10*3/uL (ref 4.0–10.5)
nRBC: 0 % (ref 0.0–0.2)

## 2022-11-11 LAB — BASIC METABOLIC PANEL
Anion gap: 10 (ref 5–15)
BUN: 20 mg/dL (ref 8–23)
CO2: 24 mmol/L (ref 22–32)
Calcium: 9.3 mg/dL (ref 8.9–10.3)
Chloride: 102 mmol/L (ref 98–111)
Creatinine, Ser: 0.58 mg/dL (ref 0.44–1.00)
GFR, Estimated: >60 mL/min (ref >60)
Glucose, Bld: 105 mg/dL — ABNORMAL HIGH (ref 70–99)
Potassium: 3.4 mmol/L — ABNORMAL LOW (ref 3.5–5.1)
Sodium: 136 mmol/L (ref 135–145)

## 2022-11-11 LAB — TROPONIN I (HIGH SENSITIVITY)
Troponin I (High Sensitivity): 5 ng/L (ref <18)
Troponin I (High Sensitivity): 6 ng/L (ref <18)

## 2022-11-11 MED ORDER — IOHEXOL 300 MG/ML  SOLN: 75.0000 mL | Freq: Once | INTRAMUSCULAR | Status: DC | PRN

## 2022-11-11 MED ORDER — IOHEXOL 350 MG/ML SOLN
75.0000 mL | Freq: Once | INTRAVENOUS | Status: AC | PRN
  Administered 2022-11-11: 75 mL via INTRAVENOUS

## 2022-11-11 MED ORDER — TETRACAINE HCL 0.5 % OP SOLN
1.0000 [drp] | Freq: Once | OPHTHALMIC | Status: AC
  Administered 2022-11-11: 1 [drp] via OPHTHALMIC
  Filled 2022-11-11: qty 4

## 2022-11-11 NOTE — ED Provider Notes (Signed)
[facility] Provider Note   CSN: [id number] Arrival date & time: 11/11/22  1713     History {Add pertinent medical, surgical, social history, OB history to HPI:1} Chief Complaint  Patient presents with  . Hypertension    Cassandra Sims is a 89 y.o. female    Hypertension       Home Medications Prior to Admission medications   Medication Sig Start Date End Date Taking? Authorizing Provider  acetaminophen (TYLENOL) 500 MG tablet Take 2 tablets (1,000 mg total) by mouth every 8 (eight) hours as needed for mild pain. 05/21/22   Fondaw, Rodrigo Ran, PA  albuterol (VENTOLIN HFA) 108 (90 Base) MCG/ACT inhaler INHALE 2 PUFFS INTO THE LUNGS EVERY 4 (FOUR) HOURS AS NEEDED FOR WHEEZING. REPORTED ON 10/10/2015 08/14/22   Ellamae Sia, DO  amLODipine (NORVASC) 2.5 MG tablet Take 2.5 mg by mouth 2 (two) times daily. 07/13/20   [provider]  Azelastine HCl (ASTEPRO) 0.15 % SOLN Place 1 spray into the nose at bedtime.    [provider]  benzonatate (TESSALON PERLES) 100 MG capsule Take 1 capsule (100 mg total) by mouth 3 (three) times daily as needed for cough. 10/09/22   Charlott Holler, MD  Calcium Carbonate Antacid (TUMS PO) Take 1,000 mg by mouth daily.    [provider]  carboxymethylcellulose (REFRESH PLUS) 0.5 % SOLN Place 1 drop into both eyes 5 (five) times daily as needed (for dry eyes).    [provider]  Cholecalciferol 25 MCG (1000 UT) tablet Take 1,000 Units by mouth daily.    [provider]  dorzolamide (TRUSOPT) 2 % ophthalmic solution Place 1 drop into both eyes 3 (three) times daily. 02/08/22   [provider]  DULERA 100-5 MCG/ACT AERO INHALE 1 PUFF INTO THE LUNGS DAILY. WITH SPACER AND RINSE MOUTH AFTERWARDS. Patient taking differently: Inhale 1 puff into the lungs daily. 01/17/22   Ellamae Sia, DO  Dupilumab (DUPIXENT) 300 MG/2ML SOPN Inject 300 mg into the skin every 14  (fourteen) days. 10/22/22   Charlott Holler, MD  famotidine (PEPCID) 20 MG tablet TAKE 1 TABLET BY MOUTH EVERY DAY 07/10/22   Lupita Leash, MD  labetalol (NORMODYNE) 100 MG tablet Take 1 tablet by mouth 2 (two) times daily. 01/12/20   [provider]  Lactobacillus (ACIDOPHILUS PO) Take 1 capsule by mouth daily.    [provider]  LUTEIN PO Take 20 mg by mouth daily.    [provider]  Multiple Vitamin (MULTIVITAMIN WITH MINERALS) TABS tablet Take 1 tablet by mouth daily. Nature Made Multi Vitamin for Her 50+ W/No Iron    [provider]  olmesartan (BENICAR) 40 MG tablet Take 40 mg by mouth every evening. 04/25/20   [provider]  psyllium (METAMUCIL) 58.6 % powder Take 1 packet by mouth daily. 14.3 g (1 heaping tablespoon) every morning.    [provider]  ROCKLATAN 0.02-0.005 % SOLN Place 1 drop into both eyes at bedtime. 03/08/22   [provider]  Turmeric 450 MG CAPS Take 450 mg by mouth daily with lunch.     [provider]      Allergies    Penicillins, Zantac [ranitidine], Apraclonidine, Brinzolamide, Doxycycline, Lactose, Adhesive [tape], Clindamycin/lincomycin, Codeine, Darvocet [propoxyphene n-acetaminophen], Dilaudid [hydromorphone hcl], Flagyl [metronidazole], Lactose intolerance (gi), Levofloxacin, Macrodantin [nitrofurantoin macrocrystal], Morphine and related, Other, Pedi-pre tape spray [wound dressing adhesive], Premarin [conjugated estrogens], Tramadol hcl, Vancomycin,  Zofran [ondansetron hcl], Clarithromycin, Denosumab, and Sulfa antibiotics    Review of Systems   Review of Systems  Physical Exam Updated Vital Signs BP (!) 156/78   Pulse 61   Temp 98.2 F (36.8 C)   Resp 18   Ht  (1.575 m)   Wt 50.3 kg   SpO2 100%   BMI 20.28 kg/m  Physical Exam  ED Results / Procedures / Treatments   Labs (all labs ordered are listed, but only abnormal results are displayed) Labs Reviewed  BASIC  METABOLIC PANEL - Abnormal; Notable for the following components:      Result Value   Potassium 3.4 (*)    Glucose, Bld 105 (*)    All other components within normal limits  CBC  TROPONIN I (HIGH SENSITIVITY)  TROPONIN I (HIGH SENSITIVITY)    EKG None  Radiology DG Chest 2 View  Result Date: 11/11/2022 CLINICAL DATA:  Chest pain, hypertension EXAM: CHEST - 2 VIEW COMPARISON:  05/14/2022 FINDINGS: Stable chronic elevation of the left hemidiaphragm. Heart and mediastinal contours within normal limits. No confluent airspace opacities, effusions or edema. No acute bony abnormality. IMPRESSION: No active cardiopulmonary disease. Electronically Signed   By: Charlett Nose M.D.   On: 11/11/2022 17:47    Procedures Procedures  {Document cardiac monitor, telemetry assessment procedure when appropriate:1}  Medications Ordered in ED Medications  iohexol (OMNIPAQUE) 300 MG/ML solution 75 mL ( Intravenous Canceled Entry 11/11/22 2225)  tetracaine (PONTOCAINE) 0.5 % ophthalmic solution 1 drop (1 drop Both Eyes Given 11/11/22 2204)  iohexol (OMNIPAQUE) 350 MG/ML injection 75 mL (75 mLs Intravenous Contrast Given 11/11/22 2227)    ED Course/ Medical Decision Making/ A&P Clinical Course as of 11/11/22 2358  Mon Nov 11, 2022  2357 Troponin I (High Sensitivity): 6 [AH]  2357 Basic metabolic panel(!) [AH]  2357 CBC [AH]    Clinical Course User Index [AH] Arthor Captain, PA-C   {   Click here for ABCD2, HEART and other calculatorsREFRESH Note before signing :1}                          Medical Decision Making Amount and/or Complexity of Data Reviewed Labs: ordered. Radiology: ordered.  Risk Prescription drug management.   ***  {Document critical care time when appropriate:1} {Document review of labs and clinical decision tools ie heart score, Chads2Vasc2 etc:1}  {Document your independent review of radiology images, and any outside records:1} {Document your discussion with family  members, caretakers, and with consultants:1} {Document social determinants of health affecting pt's care:1} {Document your decision making why or why not admission, treatments were needed:1} Final Clinical Impression(s) / ED Diagnoses Final diagnoses:  None    Rx / DC Orders ED Discharge Orders     None

## 2022-11-11 NOTE — ED Triage Notes (Signed)
Pt states blood pressure has been high, headache, and chest pain that started last night but has worsened today  BP was 184/104 at home

## 2022-11-12 DIAGNOSIS — Z792 Long term (current) use of antibiotics: Secondary | ICD-10-CM | POA: Diagnosis not present

## 2022-11-12 DIAGNOSIS — M1990 Unspecified osteoarthritis, unspecified site: Secondary | ICD-10-CM | POA: Diagnosis not present

## 2022-11-12 DIAGNOSIS — J4550 Severe persistent asthma, uncomplicated: Secondary | ICD-10-CM | POA: Diagnosis not present

## 2022-11-12 DIAGNOSIS — H409 Unspecified glaucoma: Secondary | ICD-10-CM | POA: Diagnosis not present

## 2022-11-12 DIAGNOSIS — R002 Palpitations: Secondary | ICD-10-CM | POA: Diagnosis not present

## 2022-11-12 DIAGNOSIS — J310 Chronic rhinitis: Secondary | ICD-10-CM | POA: Diagnosis not present

## 2022-11-12 DIAGNOSIS — H9190 Unspecified hearing loss, unspecified ear: Secondary | ICD-10-CM | POA: Diagnosis not present

## 2022-11-12 DIAGNOSIS — Z7951 Long term (current) use of inhaled steroids: Secondary | ICD-10-CM | POA: Diagnosis not present

## 2022-11-12 DIAGNOSIS — I739 Peripheral vascular disease, unspecified: Secondary | ICD-10-CM | POA: Diagnosis not present

## 2022-11-12 DIAGNOSIS — R911 Solitary pulmonary nodule: Secondary | ICD-10-CM | POA: Diagnosis not present

## 2022-11-12 DIAGNOSIS — G43909 Migraine, unspecified, not intractable, without status migrainosus: Secondary | ICD-10-CM | POA: Diagnosis not present

## 2022-11-12 DIAGNOSIS — I10 Essential (primary) hypertension: Secondary | ICD-10-CM | POA: Diagnosis not present

## 2022-11-12 DIAGNOSIS — F419 Anxiety disorder, unspecified: Secondary | ICD-10-CM | POA: Diagnosis not present

## 2022-11-12 DIAGNOSIS — R7303 Prediabetes: Secondary | ICD-10-CM | POA: Diagnosis not present

## 2022-11-12 DIAGNOSIS — K219 Gastro-esophageal reflux disease without esophagitis: Secondary | ICD-10-CM | POA: Diagnosis not present

## 2022-11-12 NOTE — Discharge Instructions (Signed)
Please call your doctor first thing tomorrow to have medication adjustments.  It seems that during the middle of the day you are not having enough coverage and you may need a longer acting medication or more medications midday. Your cerebral aneurysm has grown.  Does not appear that you are having any active bleeding.  If you are having a headache please treat it with Tylenol.  If you are having increased dizziness or balance ear issues you may also take over-the-counter meclizine.  The brand name is Bonine or less drowsy Dramamine.  Get help right away if you: Develop a severe headache or confusion. Have unusual weakness or numbness. Feel faint. Have severe pain in your chest or abdomen. Vomit repeatedly. Have trouble breathing. These symptoms may be an emergency. Get help right away. Call 911. Do not wait to see if the symptoms will go away. Do not drive yourself to the hospital.

## 2022-11-17 DIAGNOSIS — M1990 Unspecified osteoarthritis, unspecified site: Secondary | ICD-10-CM | POA: Diagnosis not present

## 2022-11-17 DIAGNOSIS — R7303 Prediabetes: Secondary | ICD-10-CM | POA: Diagnosis not present

## 2022-11-17 DIAGNOSIS — I739 Peripheral vascular disease, unspecified: Secondary | ICD-10-CM | POA: Diagnosis not present

## 2022-11-17 DIAGNOSIS — G43909 Migraine, unspecified, not intractable, without status migrainosus: Secondary | ICD-10-CM | POA: Diagnosis not present

## 2022-11-17 DIAGNOSIS — J310 Chronic rhinitis: Secondary | ICD-10-CM | POA: Diagnosis not present

## 2022-11-17 DIAGNOSIS — Z792 Long term (current) use of antibiotics: Secondary | ICD-10-CM | POA: Diagnosis not present

## 2022-11-17 DIAGNOSIS — I10 Essential (primary) hypertension: Secondary | ICD-10-CM | POA: Diagnosis not present

## 2022-11-17 DIAGNOSIS — F419 Anxiety disorder, unspecified: Secondary | ICD-10-CM | POA: Diagnosis not present

## 2022-11-17 DIAGNOSIS — H9190 Unspecified hearing loss, unspecified ear: Secondary | ICD-10-CM | POA: Diagnosis not present

## 2022-11-17 DIAGNOSIS — H409 Unspecified glaucoma: Secondary | ICD-10-CM | POA: Diagnosis not present

## 2022-11-17 DIAGNOSIS — J4550 Severe persistent asthma, uncomplicated: Secondary | ICD-10-CM | POA: Diagnosis not present

## 2022-11-17 DIAGNOSIS — K219 Gastro-esophageal reflux disease without esophagitis: Secondary | ICD-10-CM | POA: Diagnosis not present

## 2022-11-19 DIAGNOSIS — G43909 Migraine, unspecified, not intractable, without status migrainosus: Secondary | ICD-10-CM | POA: Diagnosis not present

## 2022-11-19 DIAGNOSIS — H9190 Unspecified hearing loss, unspecified ear: Secondary | ICD-10-CM | POA: Diagnosis not present

## 2022-11-19 DIAGNOSIS — R7303 Prediabetes: Secondary | ICD-10-CM | POA: Diagnosis not present

## 2022-11-19 DIAGNOSIS — F419 Anxiety disorder, unspecified: Secondary | ICD-10-CM | POA: Diagnosis not present

## 2022-11-19 DIAGNOSIS — J310 Chronic rhinitis: Secondary | ICD-10-CM | POA: Diagnosis not present

## 2022-11-19 DIAGNOSIS — K219 Gastro-esophageal reflux disease without esophagitis: Secondary | ICD-10-CM | POA: Diagnosis not present

## 2022-11-19 DIAGNOSIS — Z792 Long term (current) use of antibiotics: Secondary | ICD-10-CM | POA: Diagnosis not present

## 2022-11-19 DIAGNOSIS — I10 Essential (primary) hypertension: Secondary | ICD-10-CM | POA: Diagnosis not present

## 2022-11-19 DIAGNOSIS — J4550 Severe persistent asthma, uncomplicated: Secondary | ICD-10-CM | POA: Diagnosis not present

## 2022-11-19 DIAGNOSIS — M1990 Unspecified osteoarthritis, unspecified site: Secondary | ICD-10-CM | POA: Diagnosis not present

## 2022-11-19 DIAGNOSIS — H409 Unspecified glaucoma: Secondary | ICD-10-CM | POA: Diagnosis not present

## 2022-11-19 DIAGNOSIS — I739 Peripheral vascular disease, unspecified: Secondary | ICD-10-CM | POA: Diagnosis not present

## 2022-11-26 DIAGNOSIS — I739 Peripheral vascular disease, unspecified: Secondary | ICD-10-CM | POA: Diagnosis not present

## 2022-11-26 DIAGNOSIS — F419 Anxiety disorder, unspecified: Secondary | ICD-10-CM | POA: Diagnosis not present

## 2022-11-26 DIAGNOSIS — H9190 Unspecified hearing loss, unspecified ear: Secondary | ICD-10-CM | POA: Diagnosis not present

## 2022-11-26 DIAGNOSIS — J4550 Severe persistent asthma, uncomplicated: Secondary | ICD-10-CM | POA: Diagnosis not present

## 2022-11-26 DIAGNOSIS — K219 Gastro-esophageal reflux disease without esophagitis: Secondary | ICD-10-CM | POA: Diagnosis not present

## 2022-11-26 DIAGNOSIS — Z792 Long term (current) use of antibiotics: Secondary | ICD-10-CM | POA: Diagnosis not present

## 2022-11-26 DIAGNOSIS — J310 Chronic rhinitis: Secondary | ICD-10-CM | POA: Diagnosis not present

## 2022-11-26 DIAGNOSIS — R7303 Prediabetes: Secondary | ICD-10-CM | POA: Diagnosis not present

## 2022-11-26 DIAGNOSIS — H409 Unspecified glaucoma: Secondary | ICD-10-CM | POA: Diagnosis not present

## 2022-11-26 DIAGNOSIS — I10 Essential (primary) hypertension: Secondary | ICD-10-CM | POA: Diagnosis not present

## 2022-11-26 DIAGNOSIS — M1990 Unspecified osteoarthritis, unspecified site: Secondary | ICD-10-CM | POA: Diagnosis not present

## 2022-11-26 DIAGNOSIS — G43909 Migraine, unspecified, not intractable, without status migrainosus: Secondary | ICD-10-CM | POA: Diagnosis not present

## 2022-12-02 DIAGNOSIS — H401133 Primary open-angle glaucoma, bilateral, severe stage: Secondary | ICD-10-CM | POA: Diagnosis not present

## 2022-12-03 DIAGNOSIS — R7303 Prediabetes: Secondary | ICD-10-CM | POA: Diagnosis not present

## 2022-12-03 DIAGNOSIS — J4550 Severe persistent asthma, uncomplicated: Secondary | ICD-10-CM | POA: Diagnosis not present

## 2022-12-03 DIAGNOSIS — K219 Gastro-esophageal reflux disease without esophagitis: Secondary | ICD-10-CM | POA: Diagnosis not present

## 2022-12-03 DIAGNOSIS — H9190 Unspecified hearing loss, unspecified ear: Secondary | ICD-10-CM | POA: Diagnosis not present

## 2022-12-03 DIAGNOSIS — H409 Unspecified glaucoma: Secondary | ICD-10-CM | POA: Diagnosis not present

## 2022-12-03 DIAGNOSIS — F419 Anxiety disorder, unspecified: Secondary | ICD-10-CM | POA: Diagnosis not present

## 2022-12-03 DIAGNOSIS — I10 Essential (primary) hypertension: Secondary | ICD-10-CM | POA: Diagnosis not present

## 2022-12-03 DIAGNOSIS — G43909 Migraine, unspecified, not intractable, without status migrainosus: Secondary | ICD-10-CM | POA: Diagnosis not present

## 2022-12-03 DIAGNOSIS — I739 Peripheral vascular disease, unspecified: Secondary | ICD-10-CM | POA: Diagnosis not present

## 2022-12-03 DIAGNOSIS — M1990 Unspecified osteoarthritis, unspecified site: Secondary | ICD-10-CM | POA: Diagnosis not present

## 2022-12-03 DIAGNOSIS — Z792 Long term (current) use of antibiotics: Secondary | ICD-10-CM | POA: Diagnosis not present

## 2022-12-03 DIAGNOSIS — J310 Chronic rhinitis: Secondary | ICD-10-CM | POA: Diagnosis not present

## 2022-12-05 DIAGNOSIS — B351 Tinea unguium: Secondary | ICD-10-CM | POA: Diagnosis not present

## 2022-12-05 DIAGNOSIS — L600 Ingrowing nail: Secondary | ICD-10-CM | POA: Diagnosis not present

## 2022-12-10 DIAGNOSIS — K219 Gastro-esophageal reflux disease without esophagitis: Secondary | ICD-10-CM | POA: Diagnosis not present

## 2022-12-10 DIAGNOSIS — J310 Chronic rhinitis: Secondary | ICD-10-CM | POA: Diagnosis not present

## 2022-12-10 DIAGNOSIS — H409 Unspecified glaucoma: Secondary | ICD-10-CM | POA: Diagnosis not present

## 2022-12-10 DIAGNOSIS — I739 Peripheral vascular disease, unspecified: Secondary | ICD-10-CM | POA: Diagnosis not present

## 2022-12-10 DIAGNOSIS — I10 Essential (primary) hypertension: Secondary | ICD-10-CM | POA: Diagnosis not present

## 2022-12-10 DIAGNOSIS — Z792 Long term (current) use of antibiotics: Secondary | ICD-10-CM | POA: Diagnosis not present

## 2022-12-10 DIAGNOSIS — R7303 Prediabetes: Secondary | ICD-10-CM | POA: Diagnosis not present

## 2022-12-10 DIAGNOSIS — F419 Anxiety disorder, unspecified: Secondary | ICD-10-CM | POA: Diagnosis not present

## 2022-12-10 DIAGNOSIS — M1990 Unspecified osteoarthritis, unspecified site: Secondary | ICD-10-CM | POA: Diagnosis not present

## 2022-12-10 DIAGNOSIS — J4550 Severe persistent asthma, uncomplicated: Secondary | ICD-10-CM | POA: Diagnosis not present

## 2022-12-10 DIAGNOSIS — H9190 Unspecified hearing loss, unspecified ear: Secondary | ICD-10-CM | POA: Diagnosis not present

## 2022-12-10 DIAGNOSIS — G43909 Migraine, unspecified, not intractable, without status migrainosus: Secondary | ICD-10-CM | POA: Diagnosis not present

## 2022-12-12 ENCOUNTER — Telehealth: Payer: Self-pay | Admitting: Internal Medicine

## 2022-12-12 NOTE — Telephone Encounter (Signed)
Error

## 2022-12-13 DIAGNOSIS — R002 Palpitations: Secondary | ICD-10-CM | POA: Diagnosis not present

## 2022-12-13 DIAGNOSIS — I2510 Atherosclerotic heart disease of native coronary artery without angina pectoris: Secondary | ICD-10-CM | POA: Diagnosis not present

## 2022-12-13 DIAGNOSIS — R269 Unspecified abnormalities of gait and mobility: Secondary | ICD-10-CM | POA: Diagnosis not present

## 2022-12-13 DIAGNOSIS — I10 Essential (primary) hypertension: Secondary | ICD-10-CM | POA: Diagnosis not present

## 2022-12-17 ENCOUNTER — Telehealth: Payer: Self-pay | Admitting: Internal Medicine

## 2022-12-17 DIAGNOSIS — H9190 Unspecified hearing loss, unspecified ear: Secondary | ICD-10-CM | POA: Diagnosis not present

## 2022-12-17 DIAGNOSIS — J4550 Severe persistent asthma, uncomplicated: Secondary | ICD-10-CM | POA: Diagnosis not present

## 2022-12-17 DIAGNOSIS — R7303 Prediabetes: Secondary | ICD-10-CM | POA: Diagnosis not present

## 2022-12-17 DIAGNOSIS — F419 Anxiety disorder, unspecified: Secondary | ICD-10-CM | POA: Diagnosis not present

## 2022-12-17 DIAGNOSIS — M1990 Unspecified osteoarthritis, unspecified site: Secondary | ICD-10-CM | POA: Diagnosis not present

## 2022-12-17 DIAGNOSIS — J310 Chronic rhinitis: Secondary | ICD-10-CM | POA: Diagnosis not present

## 2022-12-17 DIAGNOSIS — K219 Gastro-esophageal reflux disease without esophagitis: Secondary | ICD-10-CM | POA: Diagnosis not present

## 2022-12-17 DIAGNOSIS — G43909 Migraine, unspecified, not intractable, without status migrainosus: Secondary | ICD-10-CM | POA: Diagnosis not present

## 2022-12-17 DIAGNOSIS — I10 Essential (primary) hypertension: Secondary | ICD-10-CM | POA: Diagnosis not present

## 2022-12-17 DIAGNOSIS — Z792 Long term (current) use of antibiotics: Secondary | ICD-10-CM | POA: Diagnosis not present

## 2022-12-17 DIAGNOSIS — H409 Unspecified glaucoma: Secondary | ICD-10-CM | POA: Diagnosis not present

## 2022-12-17 DIAGNOSIS — I739 Peripheral vascular disease, unspecified: Secondary | ICD-10-CM | POA: Diagnosis not present

## 2022-12-17 NOTE — Telephone Encounter (Signed)
PT had a family death from RSV/[place] and wonders if she would qual for the RSV shot. Pls call @ [phone]

## 2022-12-19 ENCOUNTER — Telehealth: Payer: Self-pay | Admitting: Internal Medicine

## 2022-12-19 NOTE — Telephone Encounter (Signed)
Home Health has fax'd over orders for home care for this PT and has not got "signature and date" returned fax yet. Please find a submit.  [facility]  Order # [id number] For 09/18/22 - 11/16/22  Order # [id number] For 11/17/22 - On  Call Lynden Ang [phone] with questions

## 2022-12-20 DIAGNOSIS — J4550 Severe persistent asthma, uncomplicated: Secondary | ICD-10-CM | POA: Diagnosis not present

## 2022-12-20 DIAGNOSIS — M1990 Unspecified osteoarthritis, unspecified site: Secondary | ICD-10-CM | POA: Diagnosis not present

## 2022-12-20 DIAGNOSIS — H9190 Unspecified hearing loss, unspecified ear: Secondary | ICD-10-CM | POA: Diagnosis not present

## 2022-12-20 DIAGNOSIS — H409 Unspecified glaucoma: Secondary | ICD-10-CM | POA: Diagnosis not present

## 2022-12-20 DIAGNOSIS — I10 Essential (primary) hypertension: Secondary | ICD-10-CM | POA: Diagnosis not present

## 2022-12-20 DIAGNOSIS — G43909 Migraine, unspecified, not intractable, without status migrainosus: Secondary | ICD-10-CM | POA: Diagnosis not present

## 2022-12-20 DIAGNOSIS — J310 Chronic rhinitis: Secondary | ICD-10-CM | POA: Diagnosis not present

## 2022-12-20 DIAGNOSIS — Z792 Long term (current) use of antibiotics: Secondary | ICD-10-CM | POA: Diagnosis not present

## 2022-12-20 DIAGNOSIS — K219 Gastro-esophageal reflux disease without esophagitis: Secondary | ICD-10-CM | POA: Diagnosis not present

## 2022-12-20 DIAGNOSIS — R7303 Prediabetes: Secondary | ICD-10-CM | POA: Diagnosis not present

## 2022-12-20 DIAGNOSIS — I739 Peripheral vascular disease, unspecified: Secondary | ICD-10-CM | POA: Diagnosis not present

## 2022-12-20 DIAGNOSIS — F419 Anxiety disorder, unspecified: Secondary | ICD-10-CM | POA: Diagnosis not present

## 2022-12-23 NOTE — Telephone Encounter (Signed)
Dr. Celine Mans can you please advise?

## 2022-12-23 NOTE — Telephone Encounter (Signed)
Yes, based on age. She is able to get it any pharmacy.

## 2022-12-23 NOTE — Telephone Encounter (Signed)
Called and spoke with pt letting her know the info per Dr. Desai and she verbalized understanding. Nothing further needed. 

## 2022-12-23 NOTE — Telephone Encounter (Signed)
Dr. Celine Mans, please advise if you are okay with [place] providingthe verbal.

## 2022-12-23 NOTE — Telephone Encounter (Signed)
Kelly from [facility] would like verbal orders for 1 week for 4 weeks. Kelly phone number is [phone].

## 2022-12-24 DIAGNOSIS — R7303 Prediabetes: Secondary | ICD-10-CM | POA: Diagnosis not present

## 2022-12-24 DIAGNOSIS — M1990 Unspecified osteoarthritis, unspecified site: Secondary | ICD-10-CM | POA: Diagnosis not present

## 2022-12-24 DIAGNOSIS — H9190 Unspecified hearing loss, unspecified ear: Secondary | ICD-10-CM | POA: Diagnosis not present

## 2022-12-24 DIAGNOSIS — I739 Peripheral vascular disease, unspecified: Secondary | ICD-10-CM | POA: Diagnosis not present

## 2022-12-24 DIAGNOSIS — J310 Chronic rhinitis: Secondary | ICD-10-CM | POA: Diagnosis not present

## 2022-12-24 DIAGNOSIS — K219 Gastro-esophageal reflux disease without esophagitis: Secondary | ICD-10-CM | POA: Diagnosis not present

## 2022-12-24 DIAGNOSIS — Z792 Long term (current) use of antibiotics: Secondary | ICD-10-CM | POA: Diagnosis not present

## 2022-12-24 DIAGNOSIS — I10 Essential (primary) hypertension: Secondary | ICD-10-CM | POA: Diagnosis not present

## 2022-12-24 DIAGNOSIS — H409 Unspecified glaucoma: Secondary | ICD-10-CM | POA: Diagnosis not present

## 2022-12-24 DIAGNOSIS — J4550 Severe persistent asthma, uncomplicated: Secondary | ICD-10-CM | POA: Diagnosis not present

## 2022-12-24 DIAGNOSIS — G43909 Migraine, unspecified, not intractable, without status migrainosus: Secondary | ICD-10-CM | POA: Diagnosis not present

## 2022-12-24 DIAGNOSIS — F419 Anxiety disorder, unspecified: Secondary | ICD-10-CM | POA: Diagnosis not present

## 2022-12-30 DIAGNOSIS — H9190 Unspecified hearing loss, unspecified ear: Secondary | ICD-10-CM | POA: Diagnosis not present

## 2022-12-30 DIAGNOSIS — I10 Essential (primary) hypertension: Secondary | ICD-10-CM | POA: Diagnosis not present

## 2022-12-30 DIAGNOSIS — K219 Gastro-esophageal reflux disease without esophagitis: Secondary | ICD-10-CM | POA: Diagnosis not present

## 2022-12-30 DIAGNOSIS — Z792 Long term (current) use of antibiotics: Secondary | ICD-10-CM | POA: Diagnosis not present

## 2022-12-30 DIAGNOSIS — R7303 Prediabetes: Secondary | ICD-10-CM | POA: Diagnosis not present

## 2022-12-30 DIAGNOSIS — H409 Unspecified glaucoma: Secondary | ICD-10-CM | POA: Diagnosis not present

## 2022-12-30 DIAGNOSIS — F419 Anxiety disorder, unspecified: Secondary | ICD-10-CM | POA: Diagnosis not present

## 2022-12-30 DIAGNOSIS — J310 Chronic rhinitis: Secondary | ICD-10-CM | POA: Diagnosis not present

## 2022-12-30 DIAGNOSIS — M1990 Unspecified osteoarthritis, unspecified site: Secondary | ICD-10-CM | POA: Diagnosis not present

## 2022-12-30 DIAGNOSIS — G43909 Migraine, unspecified, not intractable, without status migrainosus: Secondary | ICD-10-CM | POA: Diagnosis not present

## 2022-12-30 DIAGNOSIS — I739 Peripheral vascular disease, unspecified: Secondary | ICD-10-CM | POA: Diagnosis not present

## 2022-12-30 DIAGNOSIS — J4550 Severe persistent asthma, uncomplicated: Secondary | ICD-10-CM | POA: Diagnosis not present

## 2022-12-31 DIAGNOSIS — I739 Peripheral vascular disease, unspecified: Secondary | ICD-10-CM | POA: Diagnosis not present

## 2022-12-31 DIAGNOSIS — G43909 Migraine, unspecified, not intractable, without status migrainosus: Secondary | ICD-10-CM | POA: Diagnosis not present

## 2022-12-31 DIAGNOSIS — Z792 Long term (current) use of antibiotics: Secondary | ICD-10-CM | POA: Diagnosis not present

## 2022-12-31 DIAGNOSIS — F419 Anxiety disorder, unspecified: Secondary | ICD-10-CM | POA: Diagnosis not present

## 2022-12-31 DIAGNOSIS — H409 Unspecified glaucoma: Secondary | ICD-10-CM | POA: Diagnosis not present

## 2022-12-31 DIAGNOSIS — K219 Gastro-esophageal reflux disease without esophagitis: Secondary | ICD-10-CM | POA: Diagnosis not present

## 2022-12-31 DIAGNOSIS — H9190 Unspecified hearing loss, unspecified ear: Secondary | ICD-10-CM | POA: Diagnosis not present

## 2022-12-31 DIAGNOSIS — M1990 Unspecified osteoarthritis, unspecified site: Secondary | ICD-10-CM | POA: Diagnosis not present

## 2022-12-31 DIAGNOSIS — R7303 Prediabetes: Secondary | ICD-10-CM | POA: Diagnosis not present

## 2022-12-31 DIAGNOSIS — I10 Essential (primary) hypertension: Secondary | ICD-10-CM | POA: Diagnosis not present

## 2022-12-31 DIAGNOSIS — J310 Chronic rhinitis: Secondary | ICD-10-CM | POA: Diagnosis not present

## 2022-12-31 DIAGNOSIS — J4550 Severe persistent asthma, uncomplicated: Secondary | ICD-10-CM | POA: Diagnosis not present

## 2022-12-31 NOTE — Telephone Encounter (Signed)
Cathy w/[facility] calling again. They got Dr. Humphrey Rolls signed order but they can not recognize the signature and need a signature log form fax'd to them. Medicare is very picky she said and that is why they need this.  She has sent over faxes requesting this. Pleaser call and or fax using the information below. She will refax to [facility] fax attn Irving Burton.

## 2023-01-01 NOTE — Telephone Encounter (Signed)
When your back in office Friday please advise s

## 2023-01-02 NOTE — Telephone Encounter (Signed)
Cathy from [employer] on message for signature; Lynden Ang phone number is [phone].

## 2023-01-02 NOTE — Telephone Encounter (Signed)
Spoke with Lynden Ang at [employer] who states the only signed paperwork they need is the signature log which I have  obtained and placed in Dr. Humphrey Rolls sign folder. Dr. Celine Mans will be back in office tomorrow and will be asked to sign then.   Dr. Celine Mans could you please message me when log is signed and I will fax back to [facility]? Thank you

## 2023-01-03 NOTE — Telephone Encounter (Signed)
Completed signature log faxed to [facility] and confirmation received. Nothing further needed at this time

## 2023-01-06 DIAGNOSIS — I739 Peripheral vascular disease, unspecified: Secondary | ICD-10-CM | POA: Diagnosis not present

## 2023-01-06 DIAGNOSIS — R7303 Prediabetes: Secondary | ICD-10-CM | POA: Diagnosis not present

## 2023-01-06 DIAGNOSIS — Z792 Long term (current) use of antibiotics: Secondary | ICD-10-CM | POA: Diagnosis not present

## 2023-01-06 DIAGNOSIS — J4550 Severe persistent asthma, uncomplicated: Secondary | ICD-10-CM | POA: Diagnosis not present

## 2023-01-06 DIAGNOSIS — H409 Unspecified glaucoma: Secondary | ICD-10-CM | POA: Diagnosis not present

## 2023-01-06 DIAGNOSIS — G43909 Migraine, unspecified, not intractable, without status migrainosus: Secondary | ICD-10-CM | POA: Diagnosis not present

## 2023-01-06 DIAGNOSIS — F419 Anxiety disorder, unspecified: Secondary | ICD-10-CM | POA: Diagnosis not present

## 2023-01-06 DIAGNOSIS — J310 Chronic rhinitis: Secondary | ICD-10-CM | POA: Diagnosis not present

## 2023-01-06 DIAGNOSIS — H9190 Unspecified hearing loss, unspecified ear: Secondary | ICD-10-CM | POA: Diagnosis not present

## 2023-01-06 DIAGNOSIS — M1990 Unspecified osteoarthritis, unspecified site: Secondary | ICD-10-CM | POA: Diagnosis not present

## 2023-01-06 DIAGNOSIS — K219 Gastro-esophageal reflux disease without esophagitis: Secondary | ICD-10-CM | POA: Diagnosis not present

## 2023-01-06 DIAGNOSIS — I10 Essential (primary) hypertension: Secondary | ICD-10-CM | POA: Diagnosis not present

## 2023-01-07 DIAGNOSIS — F419 Anxiety disorder, unspecified: Secondary | ICD-10-CM | POA: Diagnosis not present

## 2023-01-07 DIAGNOSIS — H9190 Unspecified hearing loss, unspecified ear: Secondary | ICD-10-CM | POA: Diagnosis not present

## 2023-01-07 DIAGNOSIS — K219 Gastro-esophageal reflux disease without esophagitis: Secondary | ICD-10-CM | POA: Diagnosis not present

## 2023-01-07 DIAGNOSIS — M1990 Unspecified osteoarthritis, unspecified site: Secondary | ICD-10-CM | POA: Diagnosis not present

## 2023-01-07 DIAGNOSIS — G43909 Migraine, unspecified, not intractable, without status migrainosus: Secondary | ICD-10-CM | POA: Diagnosis not present

## 2023-01-07 DIAGNOSIS — Z792 Long term (current) use of antibiotics: Secondary | ICD-10-CM | POA: Diagnosis not present

## 2023-01-07 DIAGNOSIS — J4550 Severe persistent asthma, uncomplicated: Secondary | ICD-10-CM | POA: Diagnosis not present

## 2023-01-07 DIAGNOSIS — I10 Essential (primary) hypertension: Secondary | ICD-10-CM | POA: Diagnosis not present

## 2023-01-07 DIAGNOSIS — R7303 Prediabetes: Secondary | ICD-10-CM | POA: Diagnosis not present

## 2023-01-07 DIAGNOSIS — I739 Peripheral vascular disease, unspecified: Secondary | ICD-10-CM | POA: Diagnosis not present

## 2023-01-07 DIAGNOSIS — J310 Chronic rhinitis: Secondary | ICD-10-CM | POA: Diagnosis not present

## 2023-01-07 DIAGNOSIS — H409 Unspecified glaucoma: Secondary | ICD-10-CM | POA: Diagnosis not present

## 2023-01-11 ENCOUNTER — Other Ambulatory Visit: Payer: Self-pay | Admitting: Pulmonary Disease

## 2023-01-13 ENCOUNTER — Telehealth: Payer: Self-pay | Admitting: Pulmonary Disease

## 2023-01-13 NOTE — Telephone Encounter (Signed)
PT states Pharm has sent req for Pepcid to no avail. Please send script to [facility] in [place].   PT's # [phone]

## 2023-01-14 DIAGNOSIS — Z792 Long term (current) use of antibiotics: Secondary | ICD-10-CM | POA: Diagnosis not present

## 2023-01-14 DIAGNOSIS — G43909 Migraine, unspecified, not intractable, without status migrainosus: Secondary | ICD-10-CM | POA: Diagnosis not present

## 2023-01-14 DIAGNOSIS — H9190 Unspecified hearing loss, unspecified ear: Secondary | ICD-10-CM | POA: Diagnosis not present

## 2023-01-14 DIAGNOSIS — K219 Gastro-esophageal reflux disease without esophagitis: Secondary | ICD-10-CM | POA: Diagnosis not present

## 2023-01-14 DIAGNOSIS — F419 Anxiety disorder, unspecified: Secondary | ICD-10-CM | POA: Diagnosis not present

## 2023-01-14 DIAGNOSIS — R7303 Prediabetes: Secondary | ICD-10-CM | POA: Diagnosis not present

## 2023-01-14 DIAGNOSIS — I739 Peripheral vascular disease, unspecified: Secondary | ICD-10-CM | POA: Diagnosis not present

## 2023-01-14 DIAGNOSIS — I10 Essential (primary) hypertension: Secondary | ICD-10-CM | POA: Diagnosis not present

## 2023-01-14 DIAGNOSIS — J4550 Severe persistent asthma, uncomplicated: Secondary | ICD-10-CM | POA: Diagnosis not present

## 2023-01-14 DIAGNOSIS — M1990 Unspecified osteoarthritis, unspecified site: Secondary | ICD-10-CM | POA: Diagnosis not present

## 2023-01-14 DIAGNOSIS — R269 Unspecified abnormalities of gait and mobility: Secondary | ICD-10-CM | POA: Diagnosis not present

## 2023-01-14 DIAGNOSIS — I2510 Atherosclerotic heart disease of native coronary artery without angina pectoris: Secondary | ICD-10-CM | POA: Diagnosis not present

## 2023-01-14 DIAGNOSIS — H409 Unspecified glaucoma: Secondary | ICD-10-CM | POA: Diagnosis not present

## 2023-01-14 DIAGNOSIS — R002 Palpitations: Secondary | ICD-10-CM | POA: Diagnosis not present

## 2023-01-14 DIAGNOSIS — J310 Chronic rhinitis: Secondary | ICD-10-CM | POA: Diagnosis not present

## 2023-01-15 ENCOUNTER — Other Ambulatory Visit: Payer: Self-pay | Admitting: *Deleted

## 2023-01-15 ENCOUNTER — Other Ambulatory Visit (HOSPITAL_COMMUNITY): Payer: Self-pay

## 2023-01-15 DIAGNOSIS — H401133 Primary open-angle glaucoma, bilateral, severe stage: Secondary | ICD-10-CM | POA: Diagnosis not present

## 2023-01-15 MED ORDER — FAMOTIDINE 20 MG PO TABS: 20.0000 mg | ORAL_TABLET | Freq: Every day | ORAL | 1 refills | Status: DC

## 2023-01-15 NOTE — Telephone Encounter (Signed)
Prescription for pepcid sent to pharmacy requested by patient.  Called and spoke with patient.  Made her aware.  He verified understanding.  Nothing further needed.

## 2023-01-15 NOTE — Telephone Encounter (Signed)
Routing as High Priority as PT is highly upset and does not know what lese to do since she is out of her Pepcid. Please call is. Thank you.

## 2023-01-15 NOTE — Telephone Encounter (Signed)
PT wants call once done please. [phone]

## 2023-01-16 ENCOUNTER — Encounter: Payer: Self-pay | Admitting: Internal Medicine

## 2023-01-16 DIAGNOSIS — F419 Anxiety disorder, unspecified: Secondary | ICD-10-CM | POA: Diagnosis not present

## 2023-01-16 DIAGNOSIS — H9190 Unspecified hearing loss, unspecified ear: Secondary | ICD-10-CM | POA: Diagnosis not present

## 2023-01-16 DIAGNOSIS — R7303 Prediabetes: Secondary | ICD-10-CM | POA: Diagnosis not present

## 2023-01-16 DIAGNOSIS — M1990 Unspecified osteoarthritis, unspecified site: Secondary | ICD-10-CM | POA: Diagnosis not present

## 2023-01-16 DIAGNOSIS — H409 Unspecified glaucoma: Secondary | ICD-10-CM | POA: Diagnosis not present

## 2023-01-16 DIAGNOSIS — J4550 Severe persistent asthma, uncomplicated: Secondary | ICD-10-CM | POA: Diagnosis not present

## 2023-01-16 DIAGNOSIS — G43909 Migraine, unspecified, not intractable, without status migrainosus: Secondary | ICD-10-CM | POA: Diagnosis not present

## 2023-01-16 DIAGNOSIS — I10 Essential (primary) hypertension: Secondary | ICD-10-CM | POA: Diagnosis not present

## 2023-01-16 DIAGNOSIS — I739 Peripheral vascular disease, unspecified: Secondary | ICD-10-CM | POA: Diagnosis not present

## 2023-01-16 DIAGNOSIS — J310 Chronic rhinitis: Secondary | ICD-10-CM | POA: Diagnosis not present

## 2023-01-16 DIAGNOSIS — K219 Gastro-esophageal reflux disease without esophagitis: Secondary | ICD-10-CM | POA: Diagnosis not present

## 2023-01-20 ENCOUNTER — Other Ambulatory Visit: Payer: Self-pay

## 2023-01-20 DIAGNOSIS — J4550 Severe persistent asthma, uncomplicated: Secondary | ICD-10-CM | POA: Diagnosis not present

## 2023-01-20 DIAGNOSIS — H9190 Unspecified hearing loss, unspecified ear: Secondary | ICD-10-CM | POA: Diagnosis not present

## 2023-01-20 DIAGNOSIS — J310 Chronic rhinitis: Secondary | ICD-10-CM | POA: Diagnosis not present

## 2023-01-20 DIAGNOSIS — I739 Peripheral vascular disease, unspecified: Secondary | ICD-10-CM | POA: Diagnosis not present

## 2023-01-20 DIAGNOSIS — H409 Unspecified glaucoma: Secondary | ICD-10-CM | POA: Diagnosis not present

## 2023-01-20 DIAGNOSIS — K219 Gastro-esophageal reflux disease without esophagitis: Secondary | ICD-10-CM | POA: Diagnosis not present

## 2023-01-20 DIAGNOSIS — M1990 Unspecified osteoarthritis, unspecified site: Secondary | ICD-10-CM | POA: Diagnosis not present

## 2023-01-20 DIAGNOSIS — I10 Essential (primary) hypertension: Secondary | ICD-10-CM | POA: Diagnosis not present

## 2023-01-20 DIAGNOSIS — R7303 Prediabetes: Secondary | ICD-10-CM | POA: Diagnosis not present

## 2023-01-20 DIAGNOSIS — G43909 Migraine, unspecified, not intractable, without status migrainosus: Secondary | ICD-10-CM | POA: Diagnosis not present

## 2023-01-20 DIAGNOSIS — F419 Anxiety disorder, unspecified: Secondary | ICD-10-CM | POA: Diagnosis not present

## 2023-01-20 MED ORDER — FAMOTIDINE 20 MG PO TABS: 20.0000 mg | ORAL_TABLET | Freq: Every day | ORAL | 1 refills | Status: DC

## 2023-01-21 DIAGNOSIS — G43909 Migraine, unspecified, not intractable, without status migrainosus: Secondary | ICD-10-CM | POA: Diagnosis not present

## 2023-01-21 DIAGNOSIS — H409 Unspecified glaucoma: Secondary | ICD-10-CM | POA: Diagnosis not present

## 2023-01-21 DIAGNOSIS — F419 Anxiety disorder, unspecified: Secondary | ICD-10-CM | POA: Diagnosis not present

## 2023-01-21 DIAGNOSIS — M1990 Unspecified osteoarthritis, unspecified site: Secondary | ICD-10-CM | POA: Diagnosis not present

## 2023-01-21 DIAGNOSIS — H9190 Unspecified hearing loss, unspecified ear: Secondary | ICD-10-CM | POA: Diagnosis not present

## 2023-01-21 DIAGNOSIS — J310 Chronic rhinitis: Secondary | ICD-10-CM | POA: Diagnosis not present

## 2023-01-21 DIAGNOSIS — R7303 Prediabetes: Secondary | ICD-10-CM | POA: Diagnosis not present

## 2023-01-21 DIAGNOSIS — I10 Essential (primary) hypertension: Secondary | ICD-10-CM | POA: Diagnosis not present

## 2023-01-21 DIAGNOSIS — I739 Peripheral vascular disease, unspecified: Secondary | ICD-10-CM | POA: Diagnosis not present

## 2023-01-21 DIAGNOSIS — K219 Gastro-esophageal reflux disease without esophagitis: Secondary | ICD-10-CM | POA: Diagnosis not present

## 2023-01-21 DIAGNOSIS — J4550 Severe persistent asthma, uncomplicated: Secondary | ICD-10-CM | POA: Diagnosis not present

## 2023-01-27 DIAGNOSIS — J4550 Severe persistent asthma, uncomplicated: Secondary | ICD-10-CM | POA: Diagnosis not present

## 2023-01-27 DIAGNOSIS — J310 Chronic rhinitis: Secondary | ICD-10-CM | POA: Diagnosis not present

## 2023-01-27 DIAGNOSIS — H9190 Unspecified hearing loss, unspecified ear: Secondary | ICD-10-CM | POA: Diagnosis not present

## 2023-01-27 DIAGNOSIS — K219 Gastro-esophageal reflux disease without esophagitis: Secondary | ICD-10-CM | POA: Diagnosis not present

## 2023-01-27 DIAGNOSIS — G43909 Migraine, unspecified, not intractable, without status migrainosus: Secondary | ICD-10-CM | POA: Diagnosis not present

## 2023-01-27 DIAGNOSIS — M1990 Unspecified osteoarthritis, unspecified site: Secondary | ICD-10-CM | POA: Diagnosis not present

## 2023-01-27 DIAGNOSIS — I10 Essential (primary) hypertension: Secondary | ICD-10-CM | POA: Diagnosis not present

## 2023-01-27 DIAGNOSIS — H409 Unspecified glaucoma: Secondary | ICD-10-CM | POA: Diagnosis not present

## 2023-01-27 DIAGNOSIS — R7303 Prediabetes: Secondary | ICD-10-CM | POA: Diagnosis not present

## 2023-01-27 DIAGNOSIS — I739 Peripheral vascular disease, unspecified: Secondary | ICD-10-CM | POA: Diagnosis not present

## 2023-01-27 DIAGNOSIS — F419 Anxiety disorder, unspecified: Secondary | ICD-10-CM | POA: Diagnosis not present

## 2023-01-28 DIAGNOSIS — H409 Unspecified glaucoma: Secondary | ICD-10-CM | POA: Diagnosis not present

## 2023-01-28 DIAGNOSIS — G43909 Migraine, unspecified, not intractable, without status migrainosus: Secondary | ICD-10-CM | POA: Diagnosis not present

## 2023-01-28 DIAGNOSIS — J310 Chronic rhinitis: Secondary | ICD-10-CM | POA: Diagnosis not present

## 2023-01-28 DIAGNOSIS — R7303 Prediabetes: Secondary | ICD-10-CM | POA: Diagnosis not present

## 2023-01-28 DIAGNOSIS — F419 Anxiety disorder, unspecified: Secondary | ICD-10-CM | POA: Diagnosis not present

## 2023-01-28 DIAGNOSIS — I10 Essential (primary) hypertension: Secondary | ICD-10-CM | POA: Diagnosis not present

## 2023-01-28 DIAGNOSIS — J4550 Severe persistent asthma, uncomplicated: Secondary | ICD-10-CM | POA: Diagnosis not present

## 2023-01-28 DIAGNOSIS — I739 Peripheral vascular disease, unspecified: Secondary | ICD-10-CM | POA: Diagnosis not present

## 2023-01-28 DIAGNOSIS — K219 Gastro-esophageal reflux disease without esophagitis: Secondary | ICD-10-CM | POA: Diagnosis not present

## 2023-01-28 DIAGNOSIS — M1990 Unspecified osteoarthritis, unspecified site: Secondary | ICD-10-CM | POA: Diagnosis not present

## 2023-01-28 DIAGNOSIS — H9190 Unspecified hearing loss, unspecified ear: Secondary | ICD-10-CM | POA: Diagnosis not present

## 2023-02-04 DIAGNOSIS — M1990 Unspecified osteoarthritis, unspecified site: Secondary | ICD-10-CM | POA: Diagnosis not present

## 2023-02-04 DIAGNOSIS — F419 Anxiety disorder, unspecified: Secondary | ICD-10-CM | POA: Diagnosis not present

## 2023-02-04 DIAGNOSIS — I10 Essential (primary) hypertension: Secondary | ICD-10-CM | POA: Diagnosis not present

## 2023-02-04 DIAGNOSIS — H9190 Unspecified hearing loss, unspecified ear: Secondary | ICD-10-CM | POA: Diagnosis not present

## 2023-02-04 DIAGNOSIS — G43909 Migraine, unspecified, not intractable, without status migrainosus: Secondary | ICD-10-CM | POA: Diagnosis not present

## 2023-02-04 DIAGNOSIS — K219 Gastro-esophageal reflux disease without esophagitis: Secondary | ICD-10-CM | POA: Diagnosis not present

## 2023-02-04 DIAGNOSIS — R7303 Prediabetes: Secondary | ICD-10-CM | POA: Diagnosis not present

## 2023-02-04 DIAGNOSIS — J310 Chronic rhinitis: Secondary | ICD-10-CM | POA: Diagnosis not present

## 2023-02-04 DIAGNOSIS — J4550 Severe persistent asthma, uncomplicated: Secondary | ICD-10-CM | POA: Diagnosis not present

## 2023-02-04 DIAGNOSIS — H409 Unspecified glaucoma: Secondary | ICD-10-CM | POA: Diagnosis not present

## 2023-02-04 DIAGNOSIS — I739 Peripheral vascular disease, unspecified: Secondary | ICD-10-CM | POA: Diagnosis not present

## 2023-02-05 DIAGNOSIS — Z9889 Other specified postprocedural states: Secondary | ICD-10-CM | POA: Diagnosis not present

## 2023-02-05 DIAGNOSIS — Z961 Presence of intraocular lens: Secondary | ICD-10-CM | POA: Diagnosis not present

## 2023-02-05 DIAGNOSIS — H04123 Dry eye syndrome of bilateral lacrimal glands: Secondary | ICD-10-CM | POA: Diagnosis not present

## 2023-02-05 DIAGNOSIS — H401133 Primary open-angle glaucoma, bilateral, severe stage: Secondary | ICD-10-CM | POA: Diagnosis not present

## 2023-02-06 DIAGNOSIS — J310 Chronic rhinitis: Secondary | ICD-10-CM | POA: Diagnosis not present

## 2023-02-06 DIAGNOSIS — R7303 Prediabetes: Secondary | ICD-10-CM | POA: Diagnosis not present

## 2023-02-06 DIAGNOSIS — I739 Peripheral vascular disease, unspecified: Secondary | ICD-10-CM | POA: Diagnosis not present

## 2023-02-06 DIAGNOSIS — K219 Gastro-esophageal reflux disease without esophagitis: Secondary | ICD-10-CM | POA: Diagnosis not present

## 2023-02-06 DIAGNOSIS — H409 Unspecified glaucoma: Secondary | ICD-10-CM | POA: Diagnosis not present

## 2023-02-06 DIAGNOSIS — H9190 Unspecified hearing loss, unspecified ear: Secondary | ICD-10-CM | POA: Diagnosis not present

## 2023-02-06 DIAGNOSIS — G43909 Migraine, unspecified, not intractable, without status migrainosus: Secondary | ICD-10-CM | POA: Diagnosis not present

## 2023-02-06 DIAGNOSIS — F419 Anxiety disorder, unspecified: Secondary | ICD-10-CM | POA: Diagnosis not present

## 2023-02-06 DIAGNOSIS — M1990 Unspecified osteoarthritis, unspecified site: Secondary | ICD-10-CM | POA: Diagnosis not present

## 2023-02-06 DIAGNOSIS — I10 Essential (primary) hypertension: Secondary | ICD-10-CM | POA: Diagnosis not present

## 2023-02-06 DIAGNOSIS — J4550 Severe persistent asthma, uncomplicated: Secondary | ICD-10-CM | POA: Diagnosis not present

## 2023-02-10 ENCOUNTER — Ambulatory Visit: Payer: Medicare Other | Admitting: Internal Medicine

## 2023-02-10 ENCOUNTER — Encounter: Payer: Self-pay | Admitting: Internal Medicine

## 2023-02-10 VITALS — BP 122/72 | HR 59 | Temp 97.4°F | Ht 62.5 in | Wt 115.0 lb

## 2023-02-10 DIAGNOSIS — J4550 Severe persistent asthma, uncomplicated: Secondary | ICD-10-CM | POA: Diagnosis not present

## 2023-02-10 DIAGNOSIS — K219 Gastro-esophageal reflux disease without esophagitis: Secondary | ICD-10-CM | POA: Diagnosis not present

## 2023-02-10 MED ORDER — MOMETASONE FURO-FORMOTEROL FUM 100-5 MCG/ACT IN AERO: 2.0000 | INHALATION_SPRAY | Freq: Every day | RESPIRATORY_TRACT | 5 refills | Status: AC

## 2023-02-10 NOTE — Progress Notes (Signed)
Cassandra Sims    [id number]    [birth year]  Primary Care Physician:Clark, Jennye Moccasin, MD Date of Appointment: 02/10/2023 Established Patient Visit  Chief complaint:   Chief Complaint  Patient presents with   Follow-up    Breathing has been ok  Act 22     HPI: Cassandra Sims is a 89 y.o. woman with severe persistent asthma on dupixent, glaucoma, gerd, and left hemidiaphragm paralysis as well as chronic rhinitis. Previously seen by Dr. Kendrick Fries.  Started dupixent late 2023.   Interval Updates: No interval steroids for asthma.   Using dulera as needed 2-3 times/day.   Still having home health nurse come give the dupixent due to her not having the strength in her hands from osteoarthritis.    Dr. Princess Bruins is PCP.   On dupixent since Nov 2023.  Has not had bronchitis or needed steroids since starting dupixent.   Current Regimen: dulera 100 1 puff twice a day, albuterol prn, dupixent GERD: yes on pepcid, well controlled . ACT:  Asthma Control Test ACT Total Score  02/10/2023  8:30 AM 22  01/31/2021  4:00 PM 19  11/24/2020 10:00 AM 21   FeNO:  I have reviewed the patient's family social and past medical history and updated as appropriate.   Past Medical History:  Diagnosis Date   Anxiety    Arthritis    Asthma    Complication of anesthesia    hard to wake up, 2009  sit up in bed to fast and BP dropped low code blue called per pt.   Family history of adverse reaction to anesthesia    sister has naseau   GERD (gastroesophageal reflux disease)    Glaucoma    Headache    hx of migrains   HOH (hard of hearing)    Hypertension    Hypoglycemia    Multiple allergies    Peripheral vascular disease (HCC)    varicose veins , Laser vein surgery   PONV (postoperative nausea and vomiting)    Pre-diabetes    Prolonged QT interval 01/13/2018   Rhinitis    S/P knee replacement 01/13/2018   Wears glasses     Past Surgical History:  Procedure Laterality  Date   ABDOMINAL HYSTERECTOMY     APPENDECTOMY     BREAST SURGERY     biopsy left    COLECTOMY  2008   obstruction   DIAGNOSTIC LAPAROSCOPY     exp   DILATION AND CURETTAGE OF UTERUS     EYE SURGERY     cataract   bil and retina surgery right eye, glaucoma surgery, right eye   FUNCTIONAL ENDOSCOPIC SINUS SURGERY  2009   bilat ethm,frontal,max   KNEE ARTHROSCOPY Right 03/30/2018   Procedure: right knee scope; synovectomy;  Surgeon: Ollen Gross, MD;  Location: [facility];  Service: Orthopedics;  Laterality: Right;    NASAL SINUS SURGERY Bilateral 10/26/2012   Procedure: BILATERAL ENDOSCOPIC REVISION ETHMOID MAXILLARY AND FRONTAL SINUS SURGERY  ;  Surgeon: Serena Colonel, MD;  Location: [facility];  Service: ENT;  Laterality: Bilateral;   OVARIAN CYST SURGERY     TOTAL KNEE ARTHROPLASTY Right 01/05/2018   Procedure: RIGHT TOTAL KNEE ARTHROPLASTY;  Surgeon: Ollen Gross, MD;  Location: [facility];  Service: Orthopedics;  Laterality: Right;   TUBAL LIGATION     URETER SURGERY     cut accidentally during exp lap-ovarien cyst   VEIN LIGATION  Family History  Problem Relation Age of Onset   Glaucoma Mother    Arthritis Mother    Lung disease Sister    Breast cancer Daughter    Diabetes Mellitus I Other    Leukemia Brother    Allergic rhinitis Neg Hx    Angioedema Neg Hx    Asthma Neg Hx    Atopy Neg Hx    Eczema Neg Hx    Urticaria Neg Hx    Immunodeficiency Neg Hx     Social History   Occupational History   Not on file  Tobacco Use   Smoking status: Never   Smokeless tobacco: Never  Vaping Use   Vaping status: Never Used  Substance and Sexual Activity   Alcohol use: No   Drug use: No   Sexual activity: Not on file     Physical Exam: Blood pressure 122/72, pulse (!) 59, temperature (!) 97.4 F (36.3 C), temperature source Temporal, height 5' 2.5" (1.588 m), weight 115 lb (52.2 kg), SpO2 97%.  Gen:      No acute distress, elderly  kyphosis Lungs:   ctab no wheezes or crackles CV:         RRR no murmur Ext: severe OA in hands   Data Reviewed: Imaging: I have personally reviewed the CT Chest October 2023 - LLL atelectasis with elevated left hemidiaphragm. Mild peribronchial thickening.   PFTs:     Latest Ref Rng & Units 06/06/2016    3:07 PM  PFT Results  FVC-Pre L 2.16   FVC-Predicted Pre % 88   FVC-Post L 2.11   FVC-Predicted Post % 86   Pre FEV1/FVC % % 70   Post FEV1/FCV % % 77   FEV1-Pre L 1.52   FEV1-Predicted Pre % 84   FEV1-Post L 1.62   DLCO uncorrected ml/min/mmHg 18.08   DLCO UNC% % 78   DLCO corrected ml/min/mmHg 17.24   DLCO COR %Predicted % 75   DLVA Predicted % 94   TLC L 4.43   TLC % Predicted % 90   RV % Predicted % 93    I have personally reviewed the patient's PFTs and mild airflow limitation without significant response to BD.   Labs: IgE 9 in Nov 2023 In Sept 2017 region 2 IgE panel negative with total IgE 14  Immunization status: Immunization History  Administered Date(s) Administered   19-influenza Whole 04/10/2017   Fluad Quad(high Dose 65+) 04/22/2014   Influenza Split 04/21/2013   Influenza Whole 04/14/2012   Influenza, High Dose Seasonal PF 04/30/2013, 04/24/2015, 05/29/2015, 05/22/2016, 04/10/2017, 03/31/2018, 05/07/2019   Influenza,inj,Quad PF,6+ Mos 04/30/2013, 04/24/2015, 05/29/2015, 05/22/2016   Influenza,inj,quad, With Preservative 04/22/2014   Influenza-Unspecified 04/14/2012, 04/21/2013, 04/30/2013, 04/22/2014, 04/24/2015, 05/29/2015, 05/29/2015, 05/22/2016, 05/22/2016, 04/10/2017, 05/10/2021   PFIZER Comirnaty(Gray Top)Covid-19 Tri-Sucrose Vaccine 12/26/2020   PFIZER(Purple Top)SARS-COV-2 Vaccination 08/06/2019, 08/27/2019, 05/31/2020   Pfizer Covid-19 Vaccine Bivalent Booster 68yrs & up 12/26/2020, 04/17/2021   Pneumococcal Conjugate,unspecified 07/22/2010   Pneumococcal Conjugate-13 12/10/2008, 07/22/2010, 07/22/2010, 05/29/2015   Pneumococcal  Polysaccharide-23 12/10/2008, 03/29/2011   Pneumococcal-Unspecified 12/10/2008   Tdap 04/30/2011   Zoster Recombinant(Shingrix) 11/15/2016, 02/04/2017   Zoster, Live 04/30/2011   Zoster, Unspecified 11/15/2016, 02/04/2017    External Records Personally Reviewed: pulmonary  Assessment:  Severe persistent asthma on dupixent, controlld Dizzness and gait instability,  Blurry vision GERD, controlled Chronic Cough - likely multifactorial from GERD, rhinitis  Plan/Recommendations: Continue the pepcid for your coughing related to reflux.   Continue dupixent injections. You can take tylenol  for the joint pains after injection.   Continue dulera 100 1 puff twice a day - You can take it as needed as you are doing right now. If breathing worsens or you start needing more albuterol inhaler, the first thing I want you to do is increase this to twice daily.   Continue albuterol as needed.     Return to Care: Return in about 4 months (around 06/13/2023).   Durel Salts, MD Pulmonary and Critical Care Medicine [facility] Office:[phone]

## 2023-02-10 NOTE — Patient Instructions (Signed)
Please schedule follow up scheduled with myself in 4 months.  If my schedule is not open yet, we will contact you with a reminder closer to that time. Please call [phone] if you haven't heard from [place] a month before.   Continue the pepcid for your coughing related to reflux.   Continue dupixent injections. You can take tylenol for the joint pains after injection.   Continue dulera 100 1 puff twice a day - You can take it as needed as you are doing right now. If breathing worsens or you start needing more albuterol inhaler, the first thing I want you to do is increase this to twice daily.   Continue albuterol as needed.

## 2023-02-11 DIAGNOSIS — H9190 Unspecified hearing loss, unspecified ear: Secondary | ICD-10-CM | POA: Diagnosis not present

## 2023-02-11 DIAGNOSIS — I739 Peripheral vascular disease, unspecified: Secondary | ICD-10-CM | POA: Diagnosis not present

## 2023-02-11 DIAGNOSIS — R7303 Prediabetes: Secondary | ICD-10-CM | POA: Diagnosis not present

## 2023-02-11 DIAGNOSIS — I10 Essential (primary) hypertension: Secondary | ICD-10-CM | POA: Diagnosis not present

## 2023-02-11 DIAGNOSIS — H409 Unspecified glaucoma: Secondary | ICD-10-CM | POA: Diagnosis not present

## 2023-02-11 DIAGNOSIS — K219 Gastro-esophageal reflux disease without esophagitis: Secondary | ICD-10-CM | POA: Diagnosis not present

## 2023-02-11 DIAGNOSIS — M1990 Unspecified osteoarthritis, unspecified site: Secondary | ICD-10-CM | POA: Diagnosis not present

## 2023-02-11 DIAGNOSIS — F419 Anxiety disorder, unspecified: Secondary | ICD-10-CM | POA: Diagnosis not present

## 2023-02-11 DIAGNOSIS — G43909 Migraine, unspecified, not intractable, without status migrainosus: Secondary | ICD-10-CM | POA: Diagnosis not present

## 2023-02-11 DIAGNOSIS — J310 Chronic rhinitis: Secondary | ICD-10-CM | POA: Diagnosis not present

## 2023-02-11 DIAGNOSIS — J4550 Severe persistent asthma, uncomplicated: Secondary | ICD-10-CM | POA: Diagnosis not present

## 2023-02-12 ENCOUNTER — Other Ambulatory Visit: Payer: Self-pay | Admitting: Pharmacist

## 2023-02-12 DIAGNOSIS — K219 Gastro-esophageal reflux disease without esophagitis: Secondary | ICD-10-CM | POA: Diagnosis not present

## 2023-02-12 DIAGNOSIS — G43909 Migraine, unspecified, not intractable, without status migrainosus: Secondary | ICD-10-CM | POA: Diagnosis not present

## 2023-02-12 DIAGNOSIS — J4550 Severe persistent asthma, uncomplicated: Secondary | ICD-10-CM

## 2023-02-12 DIAGNOSIS — I739 Peripheral vascular disease, unspecified: Secondary | ICD-10-CM | POA: Diagnosis not present

## 2023-02-12 DIAGNOSIS — I10 Essential (primary) hypertension: Secondary | ICD-10-CM | POA: Diagnosis not present

## 2023-02-12 DIAGNOSIS — R7303 Prediabetes: Secondary | ICD-10-CM | POA: Diagnosis not present

## 2023-02-12 DIAGNOSIS — H9190 Unspecified hearing loss, unspecified ear: Secondary | ICD-10-CM | POA: Diagnosis not present

## 2023-02-12 DIAGNOSIS — J310 Chronic rhinitis: Secondary | ICD-10-CM | POA: Diagnosis not present

## 2023-02-12 DIAGNOSIS — M1990 Unspecified osteoarthritis, unspecified site: Secondary | ICD-10-CM | POA: Diagnosis not present

## 2023-02-12 DIAGNOSIS — F419 Anxiety disorder, unspecified: Secondary | ICD-10-CM | POA: Diagnosis not present

## 2023-02-12 DIAGNOSIS — H409 Unspecified glaucoma: Secondary | ICD-10-CM | POA: Diagnosis not present

## 2023-02-12 MED ORDER — DUPIXENT 300 MG/2ML ~~LOC~~ SOAJ
300.0000 mg | SUBCUTANEOUS | 1 refills | Status: DC
Start: 2023-02-12 — End: 2023-12-30

## 2023-02-12 NOTE — Telephone Encounter (Signed)
Refill sent for DUPIXENT to [facility]: [phone]  Dose: 300 mg SQ every 2 weeks  Last OV: 02/10/23 Provider: Dr. Celine Mans  Next OV: 05/21/2023 [facility])  Chesley Mires, PharmD, MPH, BCPS Clinical Pharmacist (Rheumatology and Pulmonology)

## 2023-02-13 DIAGNOSIS — I2510 Atherosclerotic heart disease of native coronary artery without angina pectoris: Secondary | ICD-10-CM | POA: Diagnosis not present

## 2023-02-13 DIAGNOSIS — R0600 Dyspnea, unspecified: Secondary | ICD-10-CM | POA: Diagnosis not present

## 2023-02-13 DIAGNOSIS — R002 Palpitations: Secondary | ICD-10-CM | POA: Diagnosis not present

## 2023-02-13 DIAGNOSIS — I10 Essential (primary) hypertension: Secondary | ICD-10-CM | POA: Diagnosis not present

## 2023-02-14 DIAGNOSIS — I160 Hypertensive urgency: Secondary | ICD-10-CM | POA: Diagnosis not present

## 2023-02-14 DIAGNOSIS — I725 Aneurysm of other precerebral arteries: Secondary | ICD-10-CM | POA: Diagnosis not present

## 2023-02-14 DIAGNOSIS — R2689 Other abnormalities of gait and mobility: Secondary | ICD-10-CM | POA: Diagnosis not present

## 2023-02-14 DIAGNOSIS — R7303 Prediabetes: Secondary | ICD-10-CM | POA: Diagnosis not present

## 2023-02-14 DIAGNOSIS — F411 Generalized anxiety disorder: Secondary | ICD-10-CM | POA: Diagnosis not present

## 2023-02-14 DIAGNOSIS — R001 Bradycardia, unspecified: Secondary | ICD-10-CM | POA: Diagnosis not present

## 2023-02-15 DIAGNOSIS — H9190 Unspecified hearing loss, unspecified ear: Secondary | ICD-10-CM | POA: Diagnosis not present

## 2023-02-15 DIAGNOSIS — K219 Gastro-esophageal reflux disease without esophagitis: Secondary | ICD-10-CM | POA: Diagnosis not present

## 2023-02-15 DIAGNOSIS — H409 Unspecified glaucoma: Secondary | ICD-10-CM | POA: Diagnosis not present

## 2023-02-15 DIAGNOSIS — G43909 Migraine, unspecified, not intractable, without status migrainosus: Secondary | ICD-10-CM | POA: Diagnosis not present

## 2023-02-15 DIAGNOSIS — R7303 Prediabetes: Secondary | ICD-10-CM | POA: Diagnosis not present

## 2023-02-15 DIAGNOSIS — I10 Essential (primary) hypertension: Secondary | ICD-10-CM | POA: Diagnosis not present

## 2023-02-15 DIAGNOSIS — I739 Peripheral vascular disease, unspecified: Secondary | ICD-10-CM | POA: Diagnosis not present

## 2023-02-15 DIAGNOSIS — F419 Anxiety disorder, unspecified: Secondary | ICD-10-CM | POA: Diagnosis not present

## 2023-02-15 DIAGNOSIS — M1990 Unspecified osteoarthritis, unspecified site: Secondary | ICD-10-CM | POA: Diagnosis not present

## 2023-02-15 DIAGNOSIS — J4550 Severe persistent asthma, uncomplicated: Secondary | ICD-10-CM | POA: Diagnosis not present

## 2023-02-15 DIAGNOSIS — J310 Chronic rhinitis: Secondary | ICD-10-CM | POA: Diagnosis not present

## 2023-02-18 DIAGNOSIS — F419 Anxiety disorder, unspecified: Secondary | ICD-10-CM | POA: Diagnosis not present

## 2023-02-18 DIAGNOSIS — J4550 Severe persistent asthma, uncomplicated: Secondary | ICD-10-CM | POA: Diagnosis not present

## 2023-02-18 DIAGNOSIS — M1990 Unspecified osteoarthritis, unspecified site: Secondary | ICD-10-CM | POA: Diagnosis not present

## 2023-02-18 DIAGNOSIS — R7303 Prediabetes: Secondary | ICD-10-CM | POA: Diagnosis not present

## 2023-02-18 DIAGNOSIS — H409 Unspecified glaucoma: Secondary | ICD-10-CM | POA: Diagnosis not present

## 2023-02-18 DIAGNOSIS — H9190 Unspecified hearing loss, unspecified ear: Secondary | ICD-10-CM | POA: Diagnosis not present

## 2023-02-18 DIAGNOSIS — G43909 Migraine, unspecified, not intractable, without status migrainosus: Secondary | ICD-10-CM | POA: Diagnosis not present

## 2023-02-18 DIAGNOSIS — I10 Essential (primary) hypertension: Secondary | ICD-10-CM | POA: Diagnosis not present

## 2023-02-18 DIAGNOSIS — K219 Gastro-esophageal reflux disease without esophagitis: Secondary | ICD-10-CM | POA: Diagnosis not present

## 2023-02-18 DIAGNOSIS — I739 Peripheral vascular disease, unspecified: Secondary | ICD-10-CM | POA: Diagnosis not present

## 2023-02-18 DIAGNOSIS — J310 Chronic rhinitis: Secondary | ICD-10-CM | POA: Diagnosis not present

## 2023-02-19 DIAGNOSIS — H9190 Unspecified hearing loss, unspecified ear: Secondary | ICD-10-CM | POA: Diagnosis not present

## 2023-02-19 DIAGNOSIS — I10 Essential (primary) hypertension: Secondary | ICD-10-CM | POA: Diagnosis not present

## 2023-02-19 DIAGNOSIS — H409 Unspecified glaucoma: Secondary | ICD-10-CM | POA: Diagnosis not present

## 2023-02-19 DIAGNOSIS — K219 Gastro-esophageal reflux disease without esophagitis: Secondary | ICD-10-CM | POA: Diagnosis not present

## 2023-02-19 DIAGNOSIS — G43909 Migraine, unspecified, not intractable, without status migrainosus: Secondary | ICD-10-CM | POA: Diagnosis not present

## 2023-02-19 DIAGNOSIS — I739 Peripheral vascular disease, unspecified: Secondary | ICD-10-CM | POA: Diagnosis not present

## 2023-02-19 DIAGNOSIS — F419 Anxiety disorder, unspecified: Secondary | ICD-10-CM | POA: Diagnosis not present

## 2023-02-19 DIAGNOSIS — J4550 Severe persistent asthma, uncomplicated: Secondary | ICD-10-CM | POA: Diagnosis not present

## 2023-02-19 DIAGNOSIS — R7303 Prediabetes: Secondary | ICD-10-CM | POA: Diagnosis not present

## 2023-02-19 DIAGNOSIS — M1990 Unspecified osteoarthritis, unspecified site: Secondary | ICD-10-CM | POA: Diagnosis not present

## 2023-02-19 DIAGNOSIS — J310 Chronic rhinitis: Secondary | ICD-10-CM | POA: Diagnosis not present

## 2023-02-20 DIAGNOSIS — M79674 Pain in right toe(s): Secondary | ICD-10-CM | POA: Diagnosis not present

## 2023-02-20 DIAGNOSIS — B351 Tinea unguium: Secondary | ICD-10-CM | POA: Diagnosis not present

## 2023-02-20 DIAGNOSIS — M79675 Pain in left toe(s): Secondary | ICD-10-CM | POA: Diagnosis not present

## 2023-02-20 DIAGNOSIS — L600 Ingrowing nail: Secondary | ICD-10-CM | POA: Diagnosis not present

## 2023-02-24 DIAGNOSIS — K219 Gastro-esophageal reflux disease without esophagitis: Secondary | ICD-10-CM | POA: Diagnosis not present

## 2023-02-24 DIAGNOSIS — R7303 Prediabetes: Secondary | ICD-10-CM | POA: Diagnosis not present

## 2023-02-24 DIAGNOSIS — G43909 Migraine, unspecified, not intractable, without status migrainosus: Secondary | ICD-10-CM | POA: Diagnosis not present

## 2023-02-24 DIAGNOSIS — H409 Unspecified glaucoma: Secondary | ICD-10-CM | POA: Diagnosis not present

## 2023-02-24 DIAGNOSIS — H9190 Unspecified hearing loss, unspecified ear: Secondary | ICD-10-CM | POA: Diagnosis not present

## 2023-02-24 DIAGNOSIS — J310 Chronic rhinitis: Secondary | ICD-10-CM | POA: Diagnosis not present

## 2023-02-24 DIAGNOSIS — I739 Peripheral vascular disease, unspecified: Secondary | ICD-10-CM | POA: Diagnosis not present

## 2023-02-24 DIAGNOSIS — M1990 Unspecified osteoarthritis, unspecified site: Secondary | ICD-10-CM | POA: Diagnosis not present

## 2023-02-24 DIAGNOSIS — J4550 Severe persistent asthma, uncomplicated: Secondary | ICD-10-CM | POA: Diagnosis not present

## 2023-02-24 DIAGNOSIS — I10 Essential (primary) hypertension: Secondary | ICD-10-CM | POA: Diagnosis not present

## 2023-02-24 DIAGNOSIS — F419 Anxiety disorder, unspecified: Secondary | ICD-10-CM | POA: Diagnosis not present

## 2023-02-25 DIAGNOSIS — J310 Chronic rhinitis: Secondary | ICD-10-CM | POA: Diagnosis not present

## 2023-02-25 DIAGNOSIS — H9190 Unspecified hearing loss, unspecified ear: Secondary | ICD-10-CM | POA: Diagnosis not present

## 2023-02-25 DIAGNOSIS — H409 Unspecified glaucoma: Secondary | ICD-10-CM | POA: Diagnosis not present

## 2023-02-25 DIAGNOSIS — I10 Essential (primary) hypertension: Secondary | ICD-10-CM | POA: Diagnosis not present

## 2023-02-25 DIAGNOSIS — R7303 Prediabetes: Secondary | ICD-10-CM | POA: Diagnosis not present

## 2023-02-25 DIAGNOSIS — K219 Gastro-esophageal reflux disease without esophagitis: Secondary | ICD-10-CM | POA: Diagnosis not present

## 2023-02-25 DIAGNOSIS — G43909 Migraine, unspecified, not intractable, without status migrainosus: Secondary | ICD-10-CM | POA: Diagnosis not present

## 2023-02-25 DIAGNOSIS — J4550 Severe persistent asthma, uncomplicated: Secondary | ICD-10-CM | POA: Diagnosis not present

## 2023-02-25 DIAGNOSIS — F419 Anxiety disorder, unspecified: Secondary | ICD-10-CM | POA: Diagnosis not present

## 2023-02-25 DIAGNOSIS — M1990 Unspecified osteoarthritis, unspecified site: Secondary | ICD-10-CM | POA: Diagnosis not present

## 2023-02-25 DIAGNOSIS — I739 Peripheral vascular disease, unspecified: Secondary | ICD-10-CM | POA: Diagnosis not present

## 2023-03-03 DIAGNOSIS — J4550 Severe persistent asthma, uncomplicated: Secondary | ICD-10-CM | POA: Diagnosis not present

## 2023-03-03 DIAGNOSIS — J310 Chronic rhinitis: Secondary | ICD-10-CM | POA: Diagnosis not present

## 2023-03-03 DIAGNOSIS — F419 Anxiety disorder, unspecified: Secondary | ICD-10-CM | POA: Diagnosis not present

## 2023-03-03 DIAGNOSIS — I10 Essential (primary) hypertension: Secondary | ICD-10-CM | POA: Diagnosis not present

## 2023-03-03 DIAGNOSIS — H409 Unspecified glaucoma: Secondary | ICD-10-CM | POA: Diagnosis not present

## 2023-03-03 DIAGNOSIS — G43909 Migraine, unspecified, not intractable, without status migrainosus: Secondary | ICD-10-CM | POA: Diagnosis not present

## 2023-03-03 DIAGNOSIS — M1990 Unspecified osteoarthritis, unspecified site: Secondary | ICD-10-CM | POA: Diagnosis not present

## 2023-03-03 DIAGNOSIS — I739 Peripheral vascular disease, unspecified: Secondary | ICD-10-CM | POA: Diagnosis not present

## 2023-03-03 DIAGNOSIS — R7303 Prediabetes: Secondary | ICD-10-CM | POA: Diagnosis not present

## 2023-03-03 DIAGNOSIS — H9190 Unspecified hearing loss, unspecified ear: Secondary | ICD-10-CM | POA: Diagnosis not present

## 2023-03-03 DIAGNOSIS — K219 Gastro-esophageal reflux disease without esophagitis: Secondary | ICD-10-CM | POA: Diagnosis not present

## 2023-03-04 DIAGNOSIS — F419 Anxiety disorder, unspecified: Secondary | ICD-10-CM | POA: Diagnosis not present

## 2023-03-04 DIAGNOSIS — M1990 Unspecified osteoarthritis, unspecified site: Secondary | ICD-10-CM | POA: Diagnosis not present

## 2023-03-04 DIAGNOSIS — R7303 Prediabetes: Secondary | ICD-10-CM | POA: Diagnosis not present

## 2023-03-04 DIAGNOSIS — H9190 Unspecified hearing loss, unspecified ear: Secondary | ICD-10-CM | POA: Diagnosis not present

## 2023-03-04 DIAGNOSIS — J4550 Severe persistent asthma, uncomplicated: Secondary | ICD-10-CM | POA: Diagnosis not present

## 2023-03-04 DIAGNOSIS — I10 Essential (primary) hypertension: Secondary | ICD-10-CM | POA: Diagnosis not present

## 2023-03-04 DIAGNOSIS — J310 Chronic rhinitis: Secondary | ICD-10-CM | POA: Diagnosis not present

## 2023-03-04 DIAGNOSIS — I739 Peripheral vascular disease, unspecified: Secondary | ICD-10-CM | POA: Diagnosis not present

## 2023-03-04 DIAGNOSIS — G43909 Migraine, unspecified, not intractable, without status migrainosus: Secondary | ICD-10-CM | POA: Diagnosis not present

## 2023-03-04 DIAGNOSIS — K219 Gastro-esophageal reflux disease without esophagitis: Secondary | ICD-10-CM | POA: Diagnosis not present

## 2023-03-04 DIAGNOSIS — H409 Unspecified glaucoma: Secondary | ICD-10-CM | POA: Diagnosis not present

## 2023-03-07 DIAGNOSIS — J4540 Moderate persistent asthma, uncomplicated: Secondary | ICD-10-CM | POA: Diagnosis not present

## 2023-03-07 DIAGNOSIS — R419 Unspecified symptoms and signs involving cognitive functions and awareness: Secondary | ICD-10-CM | POA: Diagnosis not present

## 2023-03-07 DIAGNOSIS — I10 Essential (primary) hypertension: Secondary | ICD-10-CM | POA: Diagnosis not present

## 2023-03-07 DIAGNOSIS — I725 Aneurysm of other precerebral arteries: Secondary | ICD-10-CM | POA: Diagnosis not present

## 2023-03-12 DIAGNOSIS — H409 Unspecified glaucoma: Secondary | ICD-10-CM | POA: Diagnosis not present

## 2023-03-12 DIAGNOSIS — M1990 Unspecified osteoarthritis, unspecified site: Secondary | ICD-10-CM | POA: Diagnosis not present

## 2023-03-12 DIAGNOSIS — K219 Gastro-esophageal reflux disease without esophagitis: Secondary | ICD-10-CM | POA: Diagnosis not present

## 2023-03-12 DIAGNOSIS — J310 Chronic rhinitis: Secondary | ICD-10-CM | POA: Diagnosis not present

## 2023-03-12 DIAGNOSIS — I739 Peripheral vascular disease, unspecified: Secondary | ICD-10-CM | POA: Diagnosis not present

## 2023-03-12 DIAGNOSIS — H9190 Unspecified hearing loss, unspecified ear: Secondary | ICD-10-CM | POA: Diagnosis not present

## 2023-03-12 DIAGNOSIS — J4550 Severe persistent asthma, uncomplicated: Secondary | ICD-10-CM | POA: Diagnosis not present

## 2023-03-12 DIAGNOSIS — R7303 Prediabetes: Secondary | ICD-10-CM | POA: Diagnosis not present

## 2023-03-12 DIAGNOSIS — I10 Essential (primary) hypertension: Secondary | ICD-10-CM | POA: Diagnosis not present

## 2023-03-12 DIAGNOSIS — G43909 Migraine, unspecified, not intractable, without status migrainosus: Secondary | ICD-10-CM | POA: Diagnosis not present

## 2023-03-12 DIAGNOSIS — F419 Anxiety disorder, unspecified: Secondary | ICD-10-CM | POA: Diagnosis not present

## 2023-03-13 DIAGNOSIS — R0600 Dyspnea, unspecified: Secondary | ICD-10-CM | POA: Diagnosis not present

## 2023-03-13 DIAGNOSIS — I725 Aneurysm of other precerebral arteries: Secondary | ICD-10-CM | POA: Diagnosis not present

## 2023-03-13 DIAGNOSIS — R1310 Dysphagia, unspecified: Secondary | ICD-10-CM | POA: Diagnosis not present

## 2023-03-13 DIAGNOSIS — I10 Essential (primary) hypertension: Secondary | ICD-10-CM | POA: Diagnosis not present

## 2023-03-14 DIAGNOSIS — I671 Cerebral aneurysm, nonruptured: Secondary | ICD-10-CM | POA: Diagnosis not present

## 2023-03-17 DIAGNOSIS — G43909 Migraine, unspecified, not intractable, without status migrainosus: Secondary | ICD-10-CM | POA: Diagnosis not present

## 2023-03-17 DIAGNOSIS — K219 Gastro-esophageal reflux disease without esophagitis: Secondary | ICD-10-CM | POA: Diagnosis not present

## 2023-03-17 DIAGNOSIS — R7303 Prediabetes: Secondary | ICD-10-CM | POA: Diagnosis not present

## 2023-03-17 DIAGNOSIS — F419 Anxiety disorder, unspecified: Secondary | ICD-10-CM | POA: Diagnosis not present

## 2023-03-17 DIAGNOSIS — H409 Unspecified glaucoma: Secondary | ICD-10-CM | POA: Diagnosis not present

## 2023-03-17 DIAGNOSIS — H9190 Unspecified hearing loss, unspecified ear: Secondary | ICD-10-CM | POA: Diagnosis not present

## 2023-03-17 DIAGNOSIS — M1990 Unspecified osteoarthritis, unspecified site: Secondary | ICD-10-CM | POA: Diagnosis not present

## 2023-03-17 DIAGNOSIS — I10 Essential (primary) hypertension: Secondary | ICD-10-CM | POA: Diagnosis not present

## 2023-03-17 DIAGNOSIS — J4550 Severe persistent asthma, uncomplicated: Secondary | ICD-10-CM | POA: Diagnosis not present

## 2023-03-17 DIAGNOSIS — I739 Peripheral vascular disease, unspecified: Secondary | ICD-10-CM | POA: Diagnosis not present

## 2023-03-18 DIAGNOSIS — I10 Essential (primary) hypertension: Secondary | ICD-10-CM | POA: Diagnosis not present

## 2023-03-18 DIAGNOSIS — H409 Unspecified glaucoma: Secondary | ICD-10-CM | POA: Diagnosis not present

## 2023-03-18 DIAGNOSIS — H9190 Unspecified hearing loss, unspecified ear: Secondary | ICD-10-CM | POA: Diagnosis not present

## 2023-03-18 DIAGNOSIS — I739 Peripheral vascular disease, unspecified: Secondary | ICD-10-CM | POA: Diagnosis not present

## 2023-03-18 DIAGNOSIS — M1990 Unspecified osteoarthritis, unspecified site: Secondary | ICD-10-CM | POA: Diagnosis not present

## 2023-03-18 DIAGNOSIS — F419 Anxiety disorder, unspecified: Secondary | ICD-10-CM | POA: Diagnosis not present

## 2023-03-18 DIAGNOSIS — K219 Gastro-esophageal reflux disease without esophagitis: Secondary | ICD-10-CM | POA: Diagnosis not present

## 2023-03-18 DIAGNOSIS — R7303 Prediabetes: Secondary | ICD-10-CM | POA: Diagnosis not present

## 2023-03-18 DIAGNOSIS — G43909 Migraine, unspecified, not intractable, without status migrainosus: Secondary | ICD-10-CM | POA: Diagnosis not present

## 2023-03-18 DIAGNOSIS — J4550 Severe persistent asthma, uncomplicated: Secondary | ICD-10-CM | POA: Diagnosis not present

## 2023-03-19 DIAGNOSIS — H401133 Primary open-angle glaucoma, bilateral, severe stage: Secondary | ICD-10-CM | POA: Diagnosis not present

## 2023-03-26 DIAGNOSIS — M1990 Unspecified osteoarthritis, unspecified site: Secondary | ICD-10-CM | POA: Diagnosis not present

## 2023-03-26 DIAGNOSIS — H409 Unspecified glaucoma: Secondary | ICD-10-CM | POA: Diagnosis not present

## 2023-03-26 DIAGNOSIS — H9190 Unspecified hearing loss, unspecified ear: Secondary | ICD-10-CM | POA: Diagnosis not present

## 2023-03-26 DIAGNOSIS — R7303 Prediabetes: Secondary | ICD-10-CM | POA: Diagnosis not present

## 2023-03-26 DIAGNOSIS — K219 Gastro-esophageal reflux disease without esophagitis: Secondary | ICD-10-CM | POA: Diagnosis not present

## 2023-03-26 DIAGNOSIS — I739 Peripheral vascular disease, unspecified: Secondary | ICD-10-CM | POA: Diagnosis not present

## 2023-03-26 DIAGNOSIS — J4550 Severe persistent asthma, uncomplicated: Secondary | ICD-10-CM | POA: Diagnosis not present

## 2023-03-26 DIAGNOSIS — G43909 Migraine, unspecified, not intractable, without status migrainosus: Secondary | ICD-10-CM | POA: Diagnosis not present

## 2023-03-26 DIAGNOSIS — F419 Anxiety disorder, unspecified: Secondary | ICD-10-CM | POA: Diagnosis not present

## 2023-03-26 DIAGNOSIS — I10 Essential (primary) hypertension: Secondary | ICD-10-CM | POA: Diagnosis not present

## 2023-04-01 DIAGNOSIS — K219 Gastro-esophageal reflux disease without esophagitis: Secondary | ICD-10-CM | POA: Diagnosis not present

## 2023-04-01 DIAGNOSIS — I10 Essential (primary) hypertension: Secondary | ICD-10-CM | POA: Diagnosis not present

## 2023-04-01 DIAGNOSIS — M1990 Unspecified osteoarthritis, unspecified site: Secondary | ICD-10-CM | POA: Diagnosis not present

## 2023-04-01 DIAGNOSIS — J4550 Severe persistent asthma, uncomplicated: Secondary | ICD-10-CM | POA: Diagnosis not present

## 2023-04-01 DIAGNOSIS — H9190 Unspecified hearing loss, unspecified ear: Secondary | ICD-10-CM | POA: Diagnosis not present

## 2023-04-01 DIAGNOSIS — R7303 Prediabetes: Secondary | ICD-10-CM | POA: Diagnosis not present

## 2023-04-01 DIAGNOSIS — G43909 Migraine, unspecified, not intractable, without status migrainosus: Secondary | ICD-10-CM | POA: Diagnosis not present

## 2023-04-01 DIAGNOSIS — H409 Unspecified glaucoma: Secondary | ICD-10-CM | POA: Diagnosis not present

## 2023-04-01 DIAGNOSIS — F419 Anxiety disorder, unspecified: Secondary | ICD-10-CM | POA: Diagnosis not present

## 2023-04-01 DIAGNOSIS — I739 Peripheral vascular disease, unspecified: Secondary | ICD-10-CM | POA: Diagnosis not present

## 2023-04-15 ENCOUNTER — Telehealth: Payer: Self-pay

## 2023-04-15 DIAGNOSIS — R7303 Prediabetes: Secondary | ICD-10-CM | POA: Diagnosis not present

## 2023-04-15 DIAGNOSIS — G43909 Migraine, unspecified, not intractable, without status migrainosus: Secondary | ICD-10-CM | POA: Diagnosis not present

## 2023-04-15 DIAGNOSIS — K219 Gastro-esophageal reflux disease without esophagitis: Secondary | ICD-10-CM | POA: Diagnosis not present

## 2023-04-15 DIAGNOSIS — H409 Unspecified glaucoma: Secondary | ICD-10-CM | POA: Diagnosis not present

## 2023-04-15 DIAGNOSIS — F419 Anxiety disorder, unspecified: Secondary | ICD-10-CM | POA: Diagnosis not present

## 2023-04-15 DIAGNOSIS — H9190 Unspecified hearing loss, unspecified ear: Secondary | ICD-10-CM | POA: Diagnosis not present

## 2023-04-15 DIAGNOSIS — M1990 Unspecified osteoarthritis, unspecified site: Secondary | ICD-10-CM | POA: Diagnosis not present

## 2023-04-15 DIAGNOSIS — I739 Peripheral vascular disease, unspecified: Secondary | ICD-10-CM | POA: Diagnosis not present

## 2023-04-15 DIAGNOSIS — J4550 Severe persistent asthma, uncomplicated: Secondary | ICD-10-CM | POA: Diagnosis not present

## 2023-04-15 DIAGNOSIS — I10 Essential (primary) hypertension: Secondary | ICD-10-CM | POA: Diagnosis not present

## 2023-04-15 NOTE — Telephone Encounter (Signed)
Patient calling Dr. Clarise Cruz pulmonary gave the nurse the incorrect dosage of the dupixent and the nurse who goes out to give Cassandra Sims her injection bc her dupixent is 300 mg and the order was for 200 mg. The nurse didn't give the patient her injection. Spoke to [place] the nurse  [phone] who will be giving the injection took a verbal correction of the dupixent to 300 mg. Cassandra Sims will go back out to the patient's home to give her dupixent injection. Patient aware.Cassandra Sims will call [facility] to get the correct dosage order.

## 2023-04-16 DIAGNOSIS — G43909 Migraine, unspecified, not intractable, without status migrainosus: Secondary | ICD-10-CM | POA: Diagnosis not present

## 2023-04-16 DIAGNOSIS — I10 Essential (primary) hypertension: Secondary | ICD-10-CM | POA: Diagnosis not present

## 2023-04-16 DIAGNOSIS — F419 Anxiety disorder, unspecified: Secondary | ICD-10-CM | POA: Diagnosis not present

## 2023-04-16 DIAGNOSIS — J4550 Severe persistent asthma, uncomplicated: Secondary | ICD-10-CM | POA: Diagnosis not present

## 2023-04-16 DIAGNOSIS — M1990 Unspecified osteoarthritis, unspecified site: Secondary | ICD-10-CM | POA: Diagnosis not present

## 2023-04-16 DIAGNOSIS — K219 Gastro-esophageal reflux disease without esophagitis: Secondary | ICD-10-CM | POA: Diagnosis not present

## 2023-04-16 DIAGNOSIS — H409 Unspecified glaucoma: Secondary | ICD-10-CM | POA: Diagnosis not present

## 2023-04-16 DIAGNOSIS — R7303 Prediabetes: Secondary | ICD-10-CM | POA: Diagnosis not present

## 2023-04-16 DIAGNOSIS — H9190 Unspecified hearing loss, unspecified ear: Secondary | ICD-10-CM | POA: Diagnosis not present

## 2023-04-16 DIAGNOSIS — I739 Peripheral vascular disease, unspecified: Secondary | ICD-10-CM | POA: Diagnosis not present

## 2023-04-21 ENCOUNTER — Emergency Department (HOSPITAL_BASED_OUTPATIENT_CLINIC_OR_DEPARTMENT_OTHER)
Admission: EM | Admit: 2023-04-21 | Discharge: 2023-04-21 | Disposition: A | Discharge status: Home / Self Care | Payer: Medicare Other | Attending: Emergency Medicine | Admitting: Emergency Medicine

## 2023-04-21 ENCOUNTER — Encounter (HOSPITAL_BASED_OUTPATIENT_CLINIC_OR_DEPARTMENT_OTHER): Payer: Self-pay | Admitting: Emergency Medicine

## 2023-04-21 ENCOUNTER — Emergency Department (HOSPITAL_BASED_OUTPATIENT_CLINIC_OR_DEPARTMENT_OTHER): Payer: Medicare Other

## 2023-04-21 ENCOUNTER — Other Ambulatory Visit: Payer: Self-pay

## 2023-04-21 DIAGNOSIS — H539 Unspecified visual disturbance: Secondary | ICD-10-CM | POA: Diagnosis not present

## 2023-04-21 DIAGNOSIS — I10 Essential (primary) hypertension: Secondary | ICD-10-CM | POA: Diagnosis not present

## 2023-04-21 DIAGNOSIS — R9431 Abnormal electrocardiogram [ECG] [EKG]: Secondary | ICD-10-CM | POA: Diagnosis not present

## 2023-04-21 DIAGNOSIS — H538 Other visual disturbances: Secondary | ICD-10-CM | POA: Insufficient documentation

## 2023-04-21 DIAGNOSIS — I6523 Occlusion and stenosis of bilateral carotid arteries: Secondary | ICD-10-CM | POA: Diagnosis not present

## 2023-04-21 DIAGNOSIS — R42 Dizziness and giddiness: Secondary | ICD-10-CM

## 2023-04-21 LAB — BASIC METABOLIC PANEL
Anion gap: 9 (ref 5–15)
BUN: 22 mg/dL (ref 8–23)
CO2: 23 mmol/L (ref 22–32)
Calcium: 8.8 mg/dL — ABNORMAL LOW (ref 8.9–10.3)
Chloride: 106 mmol/L (ref 98–111)
Creatinine, Ser: 0.59 mg/dL (ref 0.44–1.00)
GFR, Estimated: >60 mL/min (ref >60)
Glucose, Bld: 100 mg/dL — ABNORMAL HIGH (ref 70–99)
Potassium: 3.7 mmol/L (ref 3.5–5.1)
Sodium: 138 mmol/L (ref 135–145)

## 2023-04-21 LAB — URINALYSIS, ROUTINE W REFLEX MICROSCOPIC
Bilirubin Urine: NEGATIVE
Glucose, UA: NEGATIVE mg/dL
Hgb urine dipstick: NEGATIVE
Ketones, ur: NEGATIVE mg/dL
Nitrite: NEGATIVE
Protein, ur: NEGATIVE mg/dL
Specific Gravity, Urine: 1.015 (ref 1.005–1.030)
pH: 7 (ref 5.0–8.0)

## 2023-04-21 LAB — CBC
HCT: 39.8 % (ref 36.0–46.0)
Hemoglobin: 13.2 g/dL (ref 12.0–15.0)
MCH: 31.1 pg (ref 26.0–34.0)
MCHC: 33.2 g/dL (ref 30.0–36.0)
MCV: 93.9 fL (ref 80.0–100.0)
Platelets: 172 10*3/uL (ref 150–400)
RBC: 4.24 MIL/uL (ref 3.87–5.11)
RDW: 13.6 % (ref 11.5–15.5)
WBC: 5.2 10*3/uL (ref 4.0–10.5)
nRBC: 0 % (ref 0.0–0.2)

## 2023-04-21 LAB — URINALYSIS, MICROSCOPIC (REFLEX)

## 2023-04-21 MED ORDER — IOHEXOL 350 MG/ML SOLN
100.0000 mL | Freq: Once | INTRAVENOUS | Status: AC | PRN
  Administered 2023-04-21: 75 mL via INTRAVENOUS

## 2023-04-21 NOTE — ED Provider Notes (Signed)
I provided a substantive portion of the care of this patient.  I personally made/approved the management plan for this patient and take responsibility for the patient management.     Has had progressive dizziness in the setting of a known cerebral aneurysm that has been monitored by neurosurgery.  Due to risk of surgery, option has been for observation and noninterventional care.  Patient is alert with clear mental status.  Nontoxic.  Speech is clear.  Movements are coordinated and symmetric.  No respiratory distress.  I did review the CT results and reviewed the EMR evaluation of neurosurgery.  At this time we discussed the fact that typical medications for vertigo may exacerbate symptoms.  I highly suggest the patient follows up with neurosurgery to discuss any possible nonoperative managements that could be helpful.  Patient and family members voiced understanding.   Arby Barrette, MD 04/21/23 1336

## 2023-04-21 NOTE — ED Notes (Signed)
Assisted pt to bathroom in wheelchair.

## 2023-04-21 NOTE — ED Provider Notes (Signed)
[facility] Provider Note   CSN: [id number] Arrival date & time: 04/21/23  4098     History  Chief Complaint  Patient presents with   Dizziness    Cassandra Sims is a 89 y.o. female.   Dizziness  Patient is an 89 year old female with a past medical history significant for a large basilar aneurysm who has deferred treatment in the past follows with [facility] who has recommended treatment in the past however per note 03/14/2023 seems that given age there is some concerns of this is a high risk surgery.  Seems that she has had over a year now of relatively persistent dizziness and vision issues.  She states that she feels that her dizziness/vertigo and vision blurriness do seem to be worse over the past 3 to 4 days.  She also wonders if her Dupixent could be causing some of her symptoms she denies any new medications over the past few months.  But she does feel that her dizziness is worse whenever she gets her Dupixent injection.  No fevers chills nausea vomiting chest pain difficulty breathing no syncope or near syncope.  No other associated symptoms     Home Medications Prior to Admission medications   Medication Sig Start Date End Date Taking? Authorizing Provider  acetaminophen (TYLENOL) 500 MG tablet Take 2 tablets (1,000 mg total) by mouth every 8 (eight) hours as needed for mild pain. 05/21/22   Shadasia Oldfield, Rodrigo Ran, PA  albuterol (VENTOLIN HFA) 108 (90 Base) MCG/ACT inhaler INHALE 2 PUFFS INTO THE LUNGS EVERY 4 (FOUR) HOURS AS NEEDED FOR WHEEZING. REPORTED ON 10/10/2015 08/14/22   Ellamae Sia, DO  amLODipine (NORVASC) 2.5 MG tablet Take 2.5 mg by mouth 2 (two) times daily. 07/13/20   [provider]  apraclonidine (IOPIDINE) 0.5 % ophthalmic solution 1 drop 2 (two) times daily.    [provider]  Azelastine HCl (ASTEPRO) 0.15 % SOLN Place 1 spray into the nose at bedtime.    [provider]   benzonatate (TESSALON PERLES) 100 MG capsule Take 1 capsule (100 mg total) by mouth 3 (three) times daily as needed for cough. 10/09/22   Charlott Holler, MD  Calcium Carbonate Antacid (TUMS PO) Take 1,000 mg by mouth daily.    [provider]  carboxymethylcellulose (REFRESH PLUS) 0.5 % SOLN Place 1 drop into both eyes 5 (five) times daily as needed (for dry eyes).    [provider]  Cholecalciferol 25 MCG (1000 UT) tablet Take 1,000 Units by mouth daily.    [provider]  dorzolamide (TRUSOPT) 2 % ophthalmic solution Place 1 drop into both eyes 3 (three) times daily. 02/08/22   [provider]  DULERA 100-5 MCG/ACT AERO INHALE 1 PUFF INTO THE LUNGS DAILY. WITH SPACER AND RINSE MOUTH AFTERWARDS. Patient taking differently: Inhale 1 puff into the lungs daily. 01/17/22   Ellamae Sia, DO  Dupilumab (DUPIXENT) 300 MG/2ML SOPN Inject 300 mg into the skin every 14 (fourteen) days. 02/12/23   Charlott Holler, MD  famotidine (PEPCID) 20 MG tablet TAKE 1 TABLET BY MOUTH EVERY DAY 07/10/22   Lupita Leash, MD  famotidine (PEPCID) 20 MG tablet Take 1 tablet (20 mg total) by mouth daily. 01/20/23   Charlott Holler, MD  labetalol (NORMODYNE) 100 MG tablet Take 1 tablet by mouth 2 (two) times daily. 01/12/20   [provider]  Lactobacillus (ACIDOPHILUS PO) Take 1 capsule by mouth  daily.    [provider]  LUTEIN PO Take 20 mg by mouth daily.    [provider]  mometasone-formoterol (DULERA) 100-5 MCG/ACT AERO Inhale 2 puffs into the lungs daily. 02/10/23   Charlott Holler, MD  Multiple Vitamin (MULTIVITAMIN WITH MINERALS) TABS tablet Take 1 tablet by mouth daily. Nature Made Multi Vitamin for Her 50+ W/No Iron    [provider]  olmesartan (BENICAR) 40 MG tablet Take 40 mg by mouth every evening. 04/25/20   [provider]  psyllium (METAMUCIL) 58.6 % powder Take 1 packet by mouth daily. 14.3 g (1 heaping tablespoon) every  morning.    [provider]  ROCKLATAN 0.02-0.005 % SOLN Place 1 drop into both eyes at bedtime. 03/08/22   [provider]  Turmeric 450 MG CAPS Take 450 mg by mouth daily with lunch.     [provider]      Allergies    Penicillins, Zantac [ranitidine], Apraclonidine, Brinzolamide, Doxycycline, Lactose, Adhesive [tape], Clindamycin/lincomycin, Codeine, Darvocet [propoxyphene n-acetaminophen], Dilaudid [hydromorphone hcl], Flagyl [metronidazole], Lactose intolerance (gi), Levofloxacin, Macrodantin [nitrofurantoin macrocrystal], Morphine and codeine, Other, Pedi-pre tape spray [wound dressing adhesive], Premarin [conjugated estrogens], Tramadol hcl, Vancomycin, Zofran [ondansetron hcl], Clarithromycin, Denosumab, and Sulfa antibiotics    Review of Systems   Review of Systems  Neurological:  Positive for dizziness.    Physical Exam Updated Vital Signs BP (!) 153/79 (BP Location: Right Arm)   Pulse 65   Resp 18   Ht 5\' 2"  (1.575 m)   Wt 52.2 kg   SpO2 99%   BMI 21.05 kg/m  Physical Exam Vitals and nursing note reviewed.  Constitutional:      General: She is not in acute distress. HENT:     Head: Normocephalic and atraumatic.     Nose: Nose normal.  Eyes:     General: No scleral icterus. Cardiovascular:     Rate and Rhythm: Normal rate and regular rhythm.     Pulses: Normal pulses.     Heart sounds: Normal heart sounds.  Pulmonary:     Effort: Pulmonary effort is normal. No respiratory distress.     Breath sounds: No wheezing.  Abdominal:     Palpations: Abdomen is soft.     Tenderness: There is no abdominal tenderness.  Musculoskeletal:     Cervical back: Normal range of motion.     Right lower leg: No edema.     Left lower leg: No edema.  Skin:    General: Skin is warm and dry.     Capillary Refill: Capillary refill takes less than 2 seconds.  Neurological:     Mental Status: She is alert. Mental status is at baseline.     Comments: Alert  and oriented to self, place, time and event.   Speech is fluent, clear without dysarthria or dysphasia.   Strength 5/5 in upper/lower extremities   Sensation intact in upper/lower extremities   CN I not tested  CN II grossly intact visual fields bilaterally. Did not visualize posterior eye.  CN III, IV, VI PERRLA and EOMs intact bilaterally  CN V Intact sensation to sharp and light touch to the face  CN VII facial movements symmetric  CN VIII not tested  CN IX, X no uvula deviation, symmetric rise of soft palate  CN XI 5/5 SCM and trapezius strength bilaterally  CN XII Midline tongue protrusion, symmetric L/R movements   Psychiatric:        Mood and Affect: Mood  normal.        Behavior: Behavior normal.     ED Results / Procedures / Treatments   Labs (all labs ordered are listed, but only abnormal results are displayed) Labs Reviewed  BASIC METABOLIC PANEL  CBC    EKG None  Radiology No results found.  Procedures Procedures    Medications Ordered in ED Medications - No data to display  ED Course/ Medical Decision Making/ A&P Clinical Course as of 04/21/23 1524  Mon Apr 21, 2023  1134 BMP CBC unremarkable no anemia or creatinine changes [WF]  1231 2.3 cm - 04/21/23  [WF]  1232 2.0 x 1.9 x 2.1 cm, previously 2.0 x 1.6 x 1.7 cm  11/11/22 [WF]    Clinical Course User Index [WF] Gailen Shelter, PA                                 Medical Decision Making Amount and/or Complexity of Data Reviewed Labs: ordered. Radiology: ordered.  Risk Prescription drug management.   Patient is an 89 year old female with a past medical history significant for a large basilar aneurysm who has deferred treatment in the past follows with [facility] who has recommended treatment in the past however per note 03/14/2023 seems that given age there is some concerns of this is a high risk surgery.  Seems that she has had over a year now of relatively persistent  dizziness and vision issues.  She states that she feels that her dizziness/vertigo and vision blurriness do seem to be worse over the past 3 to 4 days.  She also wonders if her Dupixent could be causing some of her symptoms she denies any new medications over the past few months.  But she does feel that her dizziness is worse whenever she gets her Dupixent injection.  No fevers chills nausea vomiting chest pain difficulty breathing no syncope or near syncope.  No other associated symptoms  Patient is well-appearing on exam neurologically intact.  BMP and CBC unremarkable urinalysis without significant abnormal findings.  Patient is not having any burning with urination or frequency or urgency.  She asked for this because of frothiness in her urine.  I do not see any excessive protein She will follow-up regarding this  CT angio head and neck obtained today.  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  She will follow-up with neurosurgery regarding this.  I discussed this case with my attending physician who cosigned this note including patient's presenting symptoms, physical exam, and planned diagnostics and interventions. Attending physician stated agreement with plan or made changes to plan which were implemented.   Attending physician assessed patient at bedside.   Return precautions discussed.  Final Clinical Impression(s) / ED Diagnoses Final diagnoses:  Vertigo    Rx / DC Orders ED Discharge Orders     None         Gailen Shelter, [place] 04/21/23 1525    Arby Barrette, MD 04/24/23 0710

## 2023-04-21 NOTE — Discharge Instructions (Addendum)
Please follow-up with your neurosurgeon.  Your urine sample is without any abnormal findings.  I recommend that you make sure you are drinking plenty of water.

## 2023-04-21 NOTE — ED Triage Notes (Addendum)
States has an anyurism inbase of brain ( last CT may )  and she has dizziness  x4 days gets worse, walks with  cane  per daughter she has been fine states she has blurry vision , has glaucoma but it is worse states pain  that circles her head,  family states she has opted to not have surgery

## 2023-04-22 DIAGNOSIS — M722 Plantar fascial fibromatosis: Secondary | ICD-10-CM | POA: Diagnosis not present

## 2023-04-22 DIAGNOSIS — H409 Unspecified glaucoma: Secondary | ICD-10-CM | POA: Diagnosis not present

## 2023-04-22 DIAGNOSIS — F419 Anxiety disorder, unspecified: Secondary | ICD-10-CM | POA: Diagnosis not present

## 2023-04-22 DIAGNOSIS — B351 Tinea unguium: Secondary | ICD-10-CM | POA: Diagnosis not present

## 2023-04-22 DIAGNOSIS — J4550 Severe persistent asthma, uncomplicated: Secondary | ICD-10-CM | POA: Diagnosis not present

## 2023-04-22 DIAGNOSIS — G43909 Migraine, unspecified, not intractable, without status migrainosus: Secondary | ICD-10-CM | POA: Diagnosis not present

## 2023-04-22 DIAGNOSIS — I10 Essential (primary) hypertension: Secondary | ICD-10-CM | POA: Diagnosis not present

## 2023-04-22 DIAGNOSIS — H9190 Unspecified hearing loss, unspecified ear: Secondary | ICD-10-CM | POA: Diagnosis not present

## 2023-04-22 DIAGNOSIS — K219 Gastro-esophageal reflux disease without esophagitis: Secondary | ICD-10-CM | POA: Diagnosis not present

## 2023-04-22 DIAGNOSIS — M1990 Unspecified osteoarthritis, unspecified site: Secondary | ICD-10-CM | POA: Diagnosis not present

## 2023-04-22 DIAGNOSIS — R7303 Prediabetes: Secondary | ICD-10-CM | POA: Diagnosis not present

## 2023-04-22 DIAGNOSIS — I739 Peripheral vascular disease, unspecified: Secondary | ICD-10-CM | POA: Diagnosis not present

## 2023-05-02 DIAGNOSIS — J45909 Unspecified asthma, uncomplicated: Secondary | ICD-10-CM | POA: Diagnosis not present

## 2023-05-02 DIAGNOSIS — Z23 Encounter for immunization: Secondary | ICD-10-CM | POA: Diagnosis not present

## 2023-05-02 DIAGNOSIS — R5381 Other malaise: Secondary | ICD-10-CM | POA: Diagnosis not present

## 2023-05-02 DIAGNOSIS — J4551 Severe persistent asthma with (acute) exacerbation: Secondary | ICD-10-CM | POA: Diagnosis not present

## 2023-05-02 DIAGNOSIS — H402233 Chronic angle-closure glaucoma, bilateral, severe stage: Secondary | ICD-10-CM | POA: Diagnosis not present

## 2023-05-05 DIAGNOSIS — F419 Anxiety disorder, unspecified: Secondary | ICD-10-CM | POA: Diagnosis not present

## 2023-05-05 DIAGNOSIS — R7303 Prediabetes: Secondary | ICD-10-CM | POA: Diagnosis not present

## 2023-05-05 DIAGNOSIS — I10 Essential (primary) hypertension: Secondary | ICD-10-CM | POA: Diagnosis not present

## 2023-05-05 DIAGNOSIS — I739 Peripheral vascular disease, unspecified: Secondary | ICD-10-CM | POA: Diagnosis not present

## 2023-05-05 DIAGNOSIS — M1990 Unspecified osteoarthritis, unspecified site: Secondary | ICD-10-CM | POA: Diagnosis not present

## 2023-05-05 DIAGNOSIS — J4550 Severe persistent asthma, uncomplicated: Secondary | ICD-10-CM | POA: Diagnosis not present

## 2023-05-05 DIAGNOSIS — K219 Gastro-esophageal reflux disease without esophagitis: Secondary | ICD-10-CM | POA: Diagnosis not present

## 2023-05-05 DIAGNOSIS — H9190 Unspecified hearing loss, unspecified ear: Secondary | ICD-10-CM | POA: Diagnosis not present

## 2023-05-05 DIAGNOSIS — G43909 Migraine, unspecified, not intractable, without status migrainosus: Secondary | ICD-10-CM | POA: Diagnosis not present

## 2023-05-05 DIAGNOSIS — H409 Unspecified glaucoma: Secondary | ICD-10-CM | POA: Diagnosis not present

## 2023-05-09 DIAGNOSIS — I10 Essential (primary) hypertension: Secondary | ICD-10-CM | POA: Diagnosis not present

## 2023-05-14 ENCOUNTER — Telehealth: Payer: Self-pay | Admitting: Internal Medicine

## 2023-05-14 DIAGNOSIS — K080 Exfoliation of teeth due to systemic causes: Secondary | ICD-10-CM | POA: Diagnosis not present

## 2023-05-14 DIAGNOSIS — F419 Anxiety disorder, unspecified: Secondary | ICD-10-CM | POA: Diagnosis not present

## 2023-05-14 DIAGNOSIS — G43909 Migraine, unspecified, not intractable, without status migrainosus: Secondary | ICD-10-CM | POA: Diagnosis not present

## 2023-05-14 DIAGNOSIS — H409 Unspecified glaucoma: Secondary | ICD-10-CM | POA: Diagnosis not present

## 2023-05-14 DIAGNOSIS — K219 Gastro-esophageal reflux disease without esophagitis: Secondary | ICD-10-CM | POA: Diagnosis not present

## 2023-05-14 DIAGNOSIS — J4550 Severe persistent asthma, uncomplicated: Secondary | ICD-10-CM | POA: Diagnosis not present

## 2023-05-14 DIAGNOSIS — M1990 Unspecified osteoarthritis, unspecified site: Secondary | ICD-10-CM | POA: Diagnosis not present

## 2023-05-14 DIAGNOSIS — I739 Peripheral vascular disease, unspecified: Secondary | ICD-10-CM | POA: Diagnosis not present

## 2023-05-14 DIAGNOSIS — I10 Essential (primary) hypertension: Secondary | ICD-10-CM | POA: Diagnosis not present

## 2023-05-14 DIAGNOSIS — R7303 Prediabetes: Secondary | ICD-10-CM | POA: Diagnosis not present

## 2023-05-14 DIAGNOSIS — M810 Age-related osteoporosis without current pathological fracture: Secondary | ICD-10-CM | POA: Diagnosis not present

## 2023-05-14 DIAGNOSIS — H9190 Unspecified hearing loss, unspecified ear: Secondary | ICD-10-CM | POA: Diagnosis not present

## 2023-05-14 NOTE — Telephone Encounter (Signed)
[facility] calling. They need re certification for this PT's Dupixant.    [phone]   Fayrene Fearing  (RX for you guys will not populate sometimes in my system. It just pulls up PA.)

## 2023-05-14 NOTE — Telephone Encounter (Signed)
Returned call to Cassandra Sims but unable to reach.  VM box is full.  Unclear how [facility] is related to Dupixent  Chesley Mires, PharmD, MPH, BCPS, CPP Clinical Pharmacist (Rheumatology and Pulmonology)

## 2023-05-15 DIAGNOSIS — H401133 Primary open-angle glaucoma, bilateral, severe stage: Secondary | ICD-10-CM | POA: Diagnosis not present

## 2023-05-15 NOTE — Telephone Encounter (Signed)
I tried to call to calify. Got her VM and box is full. Closing encounter.

## 2023-05-16 DIAGNOSIS — H9190 Unspecified hearing loss, unspecified ear: Secondary | ICD-10-CM | POA: Diagnosis not present

## 2023-05-16 DIAGNOSIS — I739 Peripheral vascular disease, unspecified: Secondary | ICD-10-CM | POA: Diagnosis not present

## 2023-05-16 DIAGNOSIS — I10 Essential (primary) hypertension: Secondary | ICD-10-CM | POA: Diagnosis not present

## 2023-05-16 DIAGNOSIS — K219 Gastro-esophageal reflux disease without esophagitis: Secondary | ICD-10-CM | POA: Diagnosis not present

## 2023-05-16 DIAGNOSIS — H409 Unspecified glaucoma: Secondary | ICD-10-CM | POA: Diagnosis not present

## 2023-05-16 DIAGNOSIS — R7303 Prediabetes: Secondary | ICD-10-CM | POA: Diagnosis not present

## 2023-05-16 DIAGNOSIS — R42 Dizziness and giddiness: Secondary | ICD-10-CM | POA: Diagnosis not present

## 2023-05-16 DIAGNOSIS — M1990 Unspecified osteoarthritis, unspecified site: Secondary | ICD-10-CM | POA: Diagnosis not present

## 2023-05-16 DIAGNOSIS — F419 Anxiety disorder, unspecified: Secondary | ICD-10-CM | POA: Diagnosis not present

## 2023-05-16 DIAGNOSIS — G43909 Migraine, unspecified, not intractable, without status migrainosus: Secondary | ICD-10-CM | POA: Diagnosis not present

## 2023-05-16 DIAGNOSIS — J4550 Severe persistent asthma, uncomplicated: Secondary | ICD-10-CM | POA: Diagnosis not present

## 2023-05-20 DIAGNOSIS — H409 Unspecified glaucoma: Secondary | ICD-10-CM | POA: Diagnosis not present

## 2023-05-20 DIAGNOSIS — I10 Essential (primary) hypertension: Secondary | ICD-10-CM | POA: Diagnosis not present

## 2023-05-20 DIAGNOSIS — H9190 Unspecified hearing loss, unspecified ear: Secondary | ICD-10-CM | POA: Diagnosis not present

## 2023-05-20 DIAGNOSIS — K219 Gastro-esophageal reflux disease without esophagitis: Secondary | ICD-10-CM | POA: Diagnosis not present

## 2023-05-20 DIAGNOSIS — G43909 Migraine, unspecified, not intractable, without status migrainosus: Secondary | ICD-10-CM | POA: Diagnosis not present

## 2023-05-20 DIAGNOSIS — J4550 Severe persistent asthma, uncomplicated: Secondary | ICD-10-CM | POA: Diagnosis not present

## 2023-05-20 DIAGNOSIS — R7303 Prediabetes: Secondary | ICD-10-CM | POA: Diagnosis not present

## 2023-05-20 DIAGNOSIS — M1990 Unspecified osteoarthritis, unspecified site: Secondary | ICD-10-CM | POA: Diagnosis not present

## 2023-05-20 DIAGNOSIS — F419 Anxiety disorder, unspecified: Secondary | ICD-10-CM | POA: Diagnosis not present

## 2023-05-20 DIAGNOSIS — I739 Peripheral vascular disease, unspecified: Secondary | ICD-10-CM | POA: Diagnosis not present

## 2023-05-20 DIAGNOSIS — R42 Dizziness and giddiness: Secondary | ICD-10-CM | POA: Diagnosis not present

## 2023-05-22 DIAGNOSIS — I10 Essential (primary) hypertension: Secondary | ICD-10-CM | POA: Diagnosis not present

## 2023-05-22 DIAGNOSIS — J4551 Severe persistent asthma with (acute) exacerbation: Secondary | ICD-10-CM | POA: Diagnosis not present

## 2023-05-22 DIAGNOSIS — J8283 Eosinophilic asthma: Secondary | ICD-10-CM | POA: Diagnosis not present

## 2023-05-22 DIAGNOSIS — R5381 Other malaise: Secondary | ICD-10-CM | POA: Diagnosis not present

## 2023-05-22 DIAGNOSIS — H402233 Chronic angle-closure glaucoma, bilateral, severe stage: Secondary | ICD-10-CM | POA: Diagnosis not present

## 2023-05-23 DIAGNOSIS — R7303 Prediabetes: Secondary | ICD-10-CM | POA: Diagnosis not present

## 2023-05-23 DIAGNOSIS — I160 Hypertensive urgency: Secondary | ICD-10-CM | POA: Diagnosis not present

## 2023-05-23 DIAGNOSIS — I10 Essential (primary) hypertension: Secondary | ICD-10-CM | POA: Diagnosis not present

## 2023-05-23 DIAGNOSIS — M810 Age-related osteoporosis without current pathological fracture: Secondary | ICD-10-CM | POA: Diagnosis not present

## 2023-05-23 DIAGNOSIS — R300 Dysuria: Secondary | ICD-10-CM | POA: Diagnosis not present

## 2023-05-23 DIAGNOSIS — J4550 Severe persistent asthma, uncomplicated: Secondary | ICD-10-CM | POA: Diagnosis not present

## 2023-05-26 DIAGNOSIS — L600 Ingrowing nail: Secondary | ICD-10-CM | POA: Diagnosis not present

## 2023-06-02 DIAGNOSIS — Z4889 Encounter for other specified surgical aftercare: Secondary | ICD-10-CM | POA: Diagnosis not present

## 2023-06-03 DIAGNOSIS — J4550 Severe persistent asthma, uncomplicated: Secondary | ICD-10-CM | POA: Diagnosis not present

## 2023-06-03 DIAGNOSIS — R7303 Prediabetes: Secondary | ICD-10-CM | POA: Diagnosis not present

## 2023-06-03 DIAGNOSIS — I739 Peripheral vascular disease, unspecified: Secondary | ICD-10-CM | POA: Diagnosis not present

## 2023-06-03 DIAGNOSIS — G43909 Migraine, unspecified, not intractable, without status migrainosus: Secondary | ICD-10-CM | POA: Diagnosis not present

## 2023-06-03 DIAGNOSIS — H409 Unspecified glaucoma: Secondary | ICD-10-CM | POA: Diagnosis not present

## 2023-06-03 DIAGNOSIS — K219 Gastro-esophageal reflux disease without esophagitis: Secondary | ICD-10-CM | POA: Diagnosis not present

## 2023-06-03 DIAGNOSIS — R42 Dizziness and giddiness: Secondary | ICD-10-CM | POA: Diagnosis not present

## 2023-06-03 DIAGNOSIS — M1990 Unspecified osteoarthritis, unspecified site: Secondary | ICD-10-CM | POA: Diagnosis not present

## 2023-06-03 DIAGNOSIS — F419 Anxiety disorder, unspecified: Secondary | ICD-10-CM | POA: Diagnosis not present

## 2023-06-03 DIAGNOSIS — I10 Essential (primary) hypertension: Secondary | ICD-10-CM | POA: Diagnosis not present

## 2023-06-03 DIAGNOSIS — H9190 Unspecified hearing loss, unspecified ear: Secondary | ICD-10-CM | POA: Diagnosis not present

## 2023-06-06 DIAGNOSIS — F411 Generalized anxiety disorder: Secondary | ICD-10-CM | POA: Diagnosis not present

## 2023-06-06 DIAGNOSIS — I10 Essential (primary) hypertension: Secondary | ICD-10-CM | POA: Diagnosis not present

## 2023-06-06 DIAGNOSIS — R42 Dizziness and giddiness: Secondary | ICD-10-CM | POA: Diagnosis not present

## 2023-06-12 ENCOUNTER — Encounter: Payer: Self-pay | Admitting: *Deleted

## 2023-06-12 ENCOUNTER — Telehealth: Payer: Self-pay | Admitting: Internal Medicine

## 2023-06-12 NOTE — Telephone Encounter (Signed)
[facility] is calling. They usually give the patient a shot,Fafenra. The shot will fall on Thanksgiving day and they are wondering if it is okay fro them to give the shot on any day next week. Please call back to notify them if this is okay.

## 2023-06-12 NOTE — Telephone Encounter (Signed)
ATC patient x1 to get clarification on which physician is following her for her Asthma.  According to her chart, she has recently seen Dr. Kendrick Fries with [facility]. She last saw Dr. Celine Mans on 02/10/23 and was on Dupixent.  Dr. Celine Mans did not prescribe the [facility].   I was unable to reach the patient by phone, I sent her a mychart message to get clarification.  I called [facility] and spoke with Cordelia Pen, I advised her that Dr. Celine Mans did not order the [facility], she had been on Dupixent.  I let her know that she will need to call Dr. Ulyses Jarred office at [facility].  She verbalized understanding.  Nothing further needed.

## 2023-06-15 DIAGNOSIS — I10 Essential (primary) hypertension: Secondary | ICD-10-CM | POA: Diagnosis not present

## 2023-06-15 DIAGNOSIS — K219 Gastro-esophageal reflux disease without esophagitis: Secondary | ICD-10-CM | POA: Diagnosis not present

## 2023-06-15 DIAGNOSIS — G43909 Migraine, unspecified, not intractable, without status migrainosus: Secondary | ICD-10-CM | POA: Diagnosis not present

## 2023-06-15 DIAGNOSIS — H409 Unspecified glaucoma: Secondary | ICD-10-CM | POA: Diagnosis not present

## 2023-06-15 DIAGNOSIS — H9190 Unspecified hearing loss, unspecified ear: Secondary | ICD-10-CM | POA: Diagnosis not present

## 2023-06-15 DIAGNOSIS — F419 Anxiety disorder, unspecified: Secondary | ICD-10-CM | POA: Diagnosis not present

## 2023-06-15 DIAGNOSIS — R7303 Prediabetes: Secondary | ICD-10-CM | POA: Diagnosis not present

## 2023-06-15 DIAGNOSIS — M1990 Unspecified osteoarthritis, unspecified site: Secondary | ICD-10-CM | POA: Diagnosis not present

## 2023-06-15 DIAGNOSIS — J4550 Severe persistent asthma, uncomplicated: Secondary | ICD-10-CM | POA: Diagnosis not present

## 2023-06-15 DIAGNOSIS — R42 Dizziness and giddiness: Secondary | ICD-10-CM | POA: Diagnosis not present

## 2023-06-15 DIAGNOSIS — I739 Peripheral vascular disease, unspecified: Secondary | ICD-10-CM | POA: Diagnosis not present

## 2023-06-18 DIAGNOSIS — H9190 Unspecified hearing loss, unspecified ear: Secondary | ICD-10-CM | POA: Diagnosis not present

## 2023-06-18 DIAGNOSIS — J4550 Severe persistent asthma, uncomplicated: Secondary | ICD-10-CM | POA: Diagnosis not present

## 2023-06-18 DIAGNOSIS — M1990 Unspecified osteoarthritis, unspecified site: Secondary | ICD-10-CM | POA: Diagnosis not present

## 2023-06-18 DIAGNOSIS — G43909 Migraine, unspecified, not intractable, without status migrainosus: Secondary | ICD-10-CM | POA: Diagnosis not present

## 2023-06-18 DIAGNOSIS — R7303 Prediabetes: Secondary | ICD-10-CM | POA: Diagnosis not present

## 2023-06-18 DIAGNOSIS — K219 Gastro-esophageal reflux disease without esophagitis: Secondary | ICD-10-CM | POA: Diagnosis not present

## 2023-06-18 DIAGNOSIS — I739 Peripheral vascular disease, unspecified: Secondary | ICD-10-CM | POA: Diagnosis not present

## 2023-06-18 DIAGNOSIS — H409 Unspecified glaucoma: Secondary | ICD-10-CM | POA: Diagnosis not present

## 2023-06-18 DIAGNOSIS — R42 Dizziness and giddiness: Secondary | ICD-10-CM | POA: Diagnosis not present

## 2023-06-18 DIAGNOSIS — I10 Essential (primary) hypertension: Secondary | ICD-10-CM | POA: Diagnosis not present

## 2023-06-18 DIAGNOSIS — F419 Anxiety disorder, unspecified: Secondary | ICD-10-CM | POA: Diagnosis not present

## 2023-07-13 DIAGNOSIS — R7303 Prediabetes: Secondary | ICD-10-CM | POA: Diagnosis not present

## 2023-07-13 DIAGNOSIS — I739 Peripheral vascular disease, unspecified: Secondary | ICD-10-CM | POA: Diagnosis not present

## 2023-07-13 DIAGNOSIS — F419 Anxiety disorder, unspecified: Secondary | ICD-10-CM | POA: Diagnosis not present

## 2023-07-13 DIAGNOSIS — J4550 Severe persistent asthma, uncomplicated: Secondary | ICD-10-CM | POA: Diagnosis not present

## 2023-07-13 DIAGNOSIS — R42 Dizziness and giddiness: Secondary | ICD-10-CM | POA: Diagnosis not present

## 2023-07-13 DIAGNOSIS — H9190 Unspecified hearing loss, unspecified ear: Secondary | ICD-10-CM | POA: Diagnosis not present

## 2023-07-13 DIAGNOSIS — H409 Unspecified glaucoma: Secondary | ICD-10-CM | POA: Diagnosis not present

## 2023-07-13 DIAGNOSIS — K219 Gastro-esophageal reflux disease without esophagitis: Secondary | ICD-10-CM | POA: Diagnosis not present

## 2023-07-13 DIAGNOSIS — G43909 Migraine, unspecified, not intractable, without status migrainosus: Secondary | ICD-10-CM | POA: Diagnosis not present

## 2023-07-13 DIAGNOSIS — I10 Essential (primary) hypertension: Secondary | ICD-10-CM | POA: Diagnosis not present

## 2023-07-13 DIAGNOSIS — M1990 Unspecified osteoarthritis, unspecified site: Secondary | ICD-10-CM | POA: Diagnosis not present

## 2023-07-15 DIAGNOSIS — H9190 Unspecified hearing loss, unspecified ear: Secondary | ICD-10-CM | POA: Diagnosis not present

## 2023-07-15 DIAGNOSIS — G43909 Migraine, unspecified, not intractable, without status migrainosus: Secondary | ICD-10-CM | POA: Diagnosis not present

## 2023-07-15 DIAGNOSIS — J4550 Severe persistent asthma, uncomplicated: Secondary | ICD-10-CM | POA: Diagnosis not present

## 2023-07-15 DIAGNOSIS — H409 Unspecified glaucoma: Secondary | ICD-10-CM | POA: Diagnosis not present

## 2023-07-15 DIAGNOSIS — F419 Anxiety disorder, unspecified: Secondary | ICD-10-CM | POA: Diagnosis not present

## 2023-07-15 DIAGNOSIS — K219 Gastro-esophageal reflux disease without esophagitis: Secondary | ICD-10-CM | POA: Diagnosis not present

## 2023-07-15 DIAGNOSIS — I10 Essential (primary) hypertension: Secondary | ICD-10-CM | POA: Diagnosis not present

## 2023-07-15 DIAGNOSIS — M1990 Unspecified osteoarthritis, unspecified site: Secondary | ICD-10-CM | POA: Diagnosis not present

## 2023-07-15 DIAGNOSIS — I739 Peripheral vascular disease, unspecified: Secondary | ICD-10-CM | POA: Diagnosis not present

## 2023-07-15 DIAGNOSIS — R7303 Prediabetes: Secondary | ICD-10-CM | POA: Diagnosis not present

## 2023-07-17 DIAGNOSIS — J4550 Severe persistent asthma, uncomplicated: Secondary | ICD-10-CM | POA: Diagnosis not present

## 2023-07-17 DIAGNOSIS — K219 Gastro-esophageal reflux disease without esophagitis: Secondary | ICD-10-CM | POA: Diagnosis not present

## 2023-07-17 DIAGNOSIS — G43909 Migraine, unspecified, not intractable, without status migrainosus: Secondary | ICD-10-CM | POA: Diagnosis not present

## 2023-07-17 DIAGNOSIS — R7303 Prediabetes: Secondary | ICD-10-CM | POA: Diagnosis not present

## 2023-07-17 DIAGNOSIS — M1990 Unspecified osteoarthritis, unspecified site: Secondary | ICD-10-CM | POA: Diagnosis not present

## 2023-07-17 DIAGNOSIS — H9190 Unspecified hearing loss, unspecified ear: Secondary | ICD-10-CM | POA: Diagnosis not present

## 2023-07-17 DIAGNOSIS — F419 Anxiety disorder, unspecified: Secondary | ICD-10-CM | POA: Diagnosis not present

## 2023-07-17 DIAGNOSIS — I10 Essential (primary) hypertension: Secondary | ICD-10-CM | POA: Diagnosis not present

## 2023-07-17 DIAGNOSIS — H409 Unspecified glaucoma: Secondary | ICD-10-CM | POA: Diagnosis not present

## 2023-07-17 DIAGNOSIS — I739 Peripheral vascular disease, unspecified: Secondary | ICD-10-CM | POA: Diagnosis not present

## 2023-07-28 DIAGNOSIS — I2510 Atherosclerotic heart disease of native coronary artery without angina pectoris: Secondary | ICD-10-CM | POA: Diagnosis not present

## 2023-07-28 DIAGNOSIS — Z1330 Encounter for screening examination for mental health and behavioral disorders, unspecified: Secondary | ICD-10-CM | POA: Diagnosis not present

## 2023-07-28 DIAGNOSIS — I725 Aneurysm of other precerebral arteries: Secondary | ICD-10-CM | POA: Diagnosis not present

## 2023-07-28 DIAGNOSIS — R269 Unspecified abnormalities of gait and mobility: Secondary | ICD-10-CM | POA: Diagnosis not present

## 2023-07-28 DIAGNOSIS — I10 Essential (primary) hypertension: Secondary | ICD-10-CM | POA: Diagnosis not present

## 2023-07-29 DIAGNOSIS — L859 Epidermal thickening, unspecified: Secondary | ICD-10-CM | POA: Diagnosis not present

## 2023-07-29 DIAGNOSIS — L853 Xerosis cutis: Secondary | ICD-10-CM | POA: Diagnosis not present

## 2023-07-29 DIAGNOSIS — D1801 Hemangioma of skin and subcutaneous tissue: Secondary | ICD-10-CM | POA: Diagnosis not present

## 2023-07-29 DIAGNOSIS — L821 Other seborrheic keratosis: Secondary | ICD-10-CM | POA: Diagnosis not present

## 2023-07-31 DIAGNOSIS — B351 Tinea unguium: Secondary | ICD-10-CM | POA: Diagnosis not present

## 2023-08-14 DIAGNOSIS — H409 Unspecified glaucoma: Secondary | ICD-10-CM | POA: Diagnosis not present

## 2023-08-14 DIAGNOSIS — G43909 Migraine, unspecified, not intractable, without status migrainosus: Secondary | ICD-10-CM | POA: Diagnosis not present

## 2023-08-14 DIAGNOSIS — R7303 Prediabetes: Secondary | ICD-10-CM | POA: Diagnosis not present

## 2023-08-14 DIAGNOSIS — I739 Peripheral vascular disease, unspecified: Secondary | ICD-10-CM | POA: Diagnosis not present

## 2023-08-14 DIAGNOSIS — K219 Gastro-esophageal reflux disease without esophagitis: Secondary | ICD-10-CM | POA: Diagnosis not present

## 2023-08-14 DIAGNOSIS — J4550 Severe persistent asthma, uncomplicated: Secondary | ICD-10-CM | POA: Diagnosis not present

## 2023-08-14 DIAGNOSIS — M1990 Unspecified osteoarthritis, unspecified site: Secondary | ICD-10-CM | POA: Diagnosis not present

## 2023-08-14 DIAGNOSIS — F419 Anxiety disorder, unspecified: Secondary | ICD-10-CM | POA: Diagnosis not present

## 2023-08-14 DIAGNOSIS — H9190 Unspecified hearing loss, unspecified ear: Secondary | ICD-10-CM | POA: Diagnosis not present

## 2023-08-14 DIAGNOSIS — I10 Essential (primary) hypertension: Secondary | ICD-10-CM | POA: Diagnosis not present

## 2023-08-28 DIAGNOSIS — H402233 Chronic angle-closure glaucoma, bilateral, severe stage: Secondary | ICD-10-CM | POA: Diagnosis not present

## 2023-08-28 DIAGNOSIS — R5381 Other malaise: Secondary | ICD-10-CM | POA: Diagnosis not present

## 2023-08-28 DIAGNOSIS — J4551 Severe persistent asthma with (acute) exacerbation: Secondary | ICD-10-CM | POA: Diagnosis not present

## 2023-09-02 DIAGNOSIS — J4550 Severe persistent asthma, uncomplicated: Secondary | ICD-10-CM | POA: Diagnosis not present

## 2023-09-02 DIAGNOSIS — I700 Atherosclerosis of aorta: Secondary | ICD-10-CM | POA: Diagnosis not present

## 2023-09-02 DIAGNOSIS — I725 Aneurysm of other precerebral arteries: Secondary | ICD-10-CM | POA: Diagnosis not present

## 2023-09-02 DIAGNOSIS — R42 Dizziness and giddiness: Secondary | ICD-10-CM | POA: Diagnosis not present

## 2023-09-02 DIAGNOSIS — J439 Emphysema, unspecified: Secondary | ICD-10-CM | POA: Diagnosis not present

## 2023-09-08 DIAGNOSIS — H401133 Primary open-angle glaucoma, bilateral, severe stage: Secondary | ICD-10-CM | POA: Diagnosis not present

## 2023-09-12 DIAGNOSIS — J4550 Severe persistent asthma, uncomplicated: Secondary | ICD-10-CM | POA: Diagnosis not present

## 2023-09-12 DIAGNOSIS — H409 Unspecified glaucoma: Secondary | ICD-10-CM | POA: Diagnosis not present

## 2023-09-12 DIAGNOSIS — I10 Essential (primary) hypertension: Secondary | ICD-10-CM | POA: Diagnosis not present

## 2023-09-12 DIAGNOSIS — M1990 Unspecified osteoarthritis, unspecified site: Secondary | ICD-10-CM | POA: Diagnosis not present

## 2023-09-12 DIAGNOSIS — R7303 Prediabetes: Secondary | ICD-10-CM | POA: Diagnosis not present

## 2023-09-12 DIAGNOSIS — I739 Peripheral vascular disease, unspecified: Secondary | ICD-10-CM | POA: Diagnosis not present

## 2023-09-12 DIAGNOSIS — G43909 Migraine, unspecified, not intractable, without status migrainosus: Secondary | ICD-10-CM | POA: Diagnosis not present

## 2023-09-12 DIAGNOSIS — K219 Gastro-esophageal reflux disease without esophagitis: Secondary | ICD-10-CM | POA: Diagnosis not present

## 2023-09-12 DIAGNOSIS — F419 Anxiety disorder, unspecified: Secondary | ICD-10-CM | POA: Diagnosis not present

## 2023-09-12 DIAGNOSIS — H9190 Unspecified hearing loss, unspecified ear: Secondary | ICD-10-CM | POA: Diagnosis not present

## 2023-09-19 DIAGNOSIS — I725 Aneurysm of other precerebral arteries: Secondary | ICD-10-CM | POA: Diagnosis not present

## 2023-09-19 DIAGNOSIS — I10 Essential (primary) hypertension: Secondary | ICD-10-CM | POA: Diagnosis not present

## 2023-09-29 DIAGNOSIS — J329 Chronic sinusitis, unspecified: Secondary | ICD-10-CM | POA: Diagnosis not present

## 2023-10-06 DIAGNOSIS — I10 Essential (primary) hypertension: Secondary | ICD-10-CM | POA: Diagnosis not present

## 2023-10-06 DIAGNOSIS — I725 Aneurysm of other precerebral arteries: Secondary | ICD-10-CM | POA: Diagnosis not present

## 2023-10-13 DIAGNOSIS — E559 Vitamin D deficiency, unspecified: Secondary | ICD-10-CM | POA: Diagnosis not present

## 2023-10-13 DIAGNOSIS — M810 Age-related osteoporosis without current pathological fracture: Secondary | ICD-10-CM | POA: Diagnosis not present

## 2023-10-20 DIAGNOSIS — H401133 Primary open-angle glaucoma, bilateral, severe stage: Secondary | ICD-10-CM | POA: Diagnosis not present

## 2023-10-20 DIAGNOSIS — I725 Aneurysm of other precerebral arteries: Secondary | ICD-10-CM | POA: Diagnosis not present

## 2023-10-20 DIAGNOSIS — I10 Essential (primary) hypertension: Secondary | ICD-10-CM | POA: Diagnosis not present

## 2023-10-27 ENCOUNTER — Ambulatory Visit: Admitting: Allergy

## 2023-10-27 NOTE — Patient Instructions (Incomplete)
 Severe persistent asthma Continue to follow up with pulmonology May use albuterol 2 puffs when you are having the coughing fits. Daily controller medication(s): Take Dulera 1 puff once a day on Monday/Wednesday/Friday with spacer and rinse mouth afterwards. During upper respiratory infections/asthma flares: start Dulera 2 puffs twice a day with spacer and rinse mouth afterwards for 1-2 weeks until your breathing symptoms return to baseline.  May use albuterol rescue inhaler 2 puffs every 4 to 6 hours as needed for shortness of breath, chest tightness, coughing, and wheezing. May use albuterol rescue inhaler 2 puffs 5 to 15 minutes prior to strenuous physical activities. Monitor frequency of use.  Asthma control goals:  Full participation in all desired activities (may need albuterol before activity) Albuterol use two times or less a week on average (not counting use with activity) Cough interfering with sleep two times or less a month Oral steroids no more than once a year No hospitalizations  Rash/itching: Continue using Cerave lotion Continue proper skin care.  Non allergic rhinitis (no use of steroid nasal sprays due to diagnosis of glaucoma) Use nasal saline lavage (i.e., NeilMed) 1-2 times per day. May use Afrin 1-2 times per week for severe nasal congestion.   Heartburn  Continue dietary and lifestyle modifications. Continue with tums as needed.   Follow up in  months or sooner if needed.   Skin care recommendations  Bath time: Always use lukewarm water. AVOID very hot or cold water. Keep bathing time to 5-10 minutes. Do NOT use bubble bath. Use a mild soap and use just enough to wash the dirty areas. Do NOT scrub skin vigorously.  After bathing, pat dry your skin with a towel. Do NOT rub or scrub the skin.  Moisturizers and prescriptions:  ALWAYS apply moisturizers immediately after bathing (within 3 minutes). This helps to lock-in moisture. Use the  moisturizer several times a day over the whole body. Good summer moisturizers include: Aveeno, CeraVe, Cetaphil. Good winter moisturizers include: Aquaphor, Vaseline, Cerave, Cetaphil, Eucerin, Vanicream. When using moisturizers along with medications, the moisturizer should be applied about one hour after applying the medication to prevent diluting effect of the medication or moisturize around where you applied the medications. When not using medications, the moisturizer can be continued twice daily as maintenance.  Laundry and clothing: Avoid laundry products with added color or perfumes. Use unscented hypo-allergenic laundry products such as Tide free, Cheer free & gentle, and All free and clear.  If the skin still seems dry or sensitive, you can try double-rinsing the clothes. Avoid tight or scratchy clothing such as wool. Do not use fabric softeners or dyer sheets.

## 2023-10-28 ENCOUNTER — Encounter: Payer: Self-pay | Admitting: Family

## 2023-10-28 ENCOUNTER — Other Ambulatory Visit: Payer: Self-pay

## 2023-10-28 ENCOUNTER — Ambulatory Visit: Admitting: Family

## 2023-10-28 VITALS — BP 102/70 | HR 60 | Temp 98.0°F | Wt 111.4 lb

## 2023-10-28 DIAGNOSIS — M2011 Hallux valgus (acquired), right foot: Secondary | ICD-10-CM | POA: Diagnosis not present

## 2023-10-28 DIAGNOSIS — J4550 Severe persistent asthma, uncomplicated: Secondary | ICD-10-CM

## 2023-10-28 DIAGNOSIS — J310 Chronic rhinitis: Secondary | ICD-10-CM | POA: Diagnosis not present

## 2023-10-28 DIAGNOSIS — M2012 Hallux valgus (acquired), left foot: Secondary | ICD-10-CM | POA: Diagnosis not present

## 2023-10-28 DIAGNOSIS — H938X3 Other specified disorders of ear, bilateral: Secondary | ICD-10-CM | POA: Diagnosis not present

## 2023-10-28 DIAGNOSIS — R0981 Nasal congestion: Secondary | ICD-10-CM | POA: Diagnosis not present

## 2023-10-28 DIAGNOSIS — B351 Tinea unguium: Secondary | ICD-10-CM | POA: Diagnosis not present

## 2023-10-28 DIAGNOSIS — L299 Pruritus, unspecified: Secondary | ICD-10-CM

## 2023-10-28 NOTE — Progress Notes (Addendum)
 [address] [place] [address] Dept: [phone]  FOLLOW UP NOTE  Patient ID: Cassandra Sims, female    DOB: [birth year]  Age: 89 y.o. MRN: [id number] Date of Office Visit: 10/28/2023  Assessment  Chief Complaint: Follow-up (allergies) and Nasal Congestion (sneezing)  HPI Cassandra Sims is a 89 year old female who presents today for an acute visit of nasal congestion and ear fullness.  She was last seen on Nov 26, 2021 by myself for severe persistent asthma, rash, itching, nonallergic rhinitis, and heartburn.  Her daughter is here with her today and helps provide history.  She denies any new diagnosis or surgeries since her last office visit.  She has a history of open angle glaucoma.  Nonallergic rhinitis: Her daughter reports that approximately a month ago her primary care physician put her on doxycycline due to her ear pain/fullness and nasal congestion.  She reports nasal congestion,  ear fullness, and sneezing.  She will also occasionally have a drippy runny nose and denies postnasal drip.  She denies ear pain, fever, chills, or drainage from her ears.  She is using Astepro nasal spray 1 spray each nostril at night and Zyrtec 2.5 mg once a day.  She reports that this keeps her from sneezing so much.  She has been scared to use Afrin 1-2 times per week for severe nasal congestion.  Discussed how Afrin can be addicting and cause rebound nasal congestion if used too frequently.  She mentions that in the past she has had 2 sinus surgeries by Dr. Pollyann Kennedy.  Her most recent was at least 10 years ago.  She feels like her symptoms are worse in the winter.  Severe persistent asthma: She reports that she now follows Dr. Kendrick Fries with [facility] for her asthma and Fasenra injections.  She was previously on Dupixent.  She reports that she will have tightness in her chest at times and denies coughing, wheezing, shortness of breath.  She does not use her albuterol that often.  She is only using her  Dulera 100 mcg 1 puff as needed, but mentions that she does not use it as often as she should.  In the past year she has not made any trips to the emergency room or urgent care due to breathing problems.   Rash/itching.  She reports that she is not have any rash, but will have itching just on her right back near her shoulder. The itching is constant.  Her Fasenra injection though has helped with the itching.  She was previously on Dupixent for the itching.  Heartburn: She reports her endocrinologist wants her taking Tums twice a day.  She is not really taking famotidine once a day as prescribed.  She reports that it causes nightmares.  Drug Allergies:  Allergies  Allergen Reactions   Penicillins Shortness Of Breath and Swelling    Has patient had a PCN reaction causing immediate rash, facial/tongue/throat swelling, SOB or lightheadedness with hypotension: Yes Has patient had a PCN reaction causing severe rash involving mucus membranes or skin necrosis: No Has patient had a PCN reaction that required hospitalization: No Has patient had a PCN reaction occurring within the last 10 years: No If all of the above answers are "NO", then may proceed with Cephalosporin use.    Zantac [Ranitidine] Itching   Apraclonidine Other (See Comments)    Very light sensitive, swollen eye   Brinzolamide Other (See Comments)   Doxycycline Other (See Comments)    Pt states she  tolerates OKAY   Lactose Other (See Comments)   Adhesive [Tape] Itching    BAND-AID TAPE-skin redness/itching   Clindamycin/Lincomycin Other (See Comments)    Severe joint pain.   Codeine Nausea And Vomiting   Darvocet [Propoxyphene N-Acetaminophen] Other (See Comments)    HTN   Dilaudid [Hydromorphone Hcl] Nausea And Vomiting    DROP BLOOD PRESSURE HYPOTENSIVE   Flagyl [Metronidazole] Other (See Comments)    Joint pains   Lactose Intolerance (Gi)    Levofloxacin     Joint pain   Macrodantin [Nitrofurantoin Macrocrystal]     Morphine And Codeine     hallucinate   Other    Pedi-Pre Tape Spray [Wound Dressing Adhesive]    Premarin [Conjugated Estrogens] Itching   Tramadol Hcl Other (See Comments)    Causes insomnia   Vancomycin Hives and Itching   Zofran [Ondansetron Hcl]     HTN   Clarithromycin     Other reaction(s): Abdominal Pain Anxious/nervous feeling   Denosumab Rash   Sulfa Antibiotics Rash    Review of Systems: Negative except as per HPI   Physical Exam: BP 102/70   Pulse 60   Temp 98 F (36.7 C)   Wt 111 lb 6.4 oz (50.5 kg)   SpO2 98%   BMI 20.38 kg/m    Physical Exam Exam conducted with a chaperone present (daughter present).  Constitutional:      Appearance: Normal appearance.  HENT:     Head: Normocephalic and atraumatic.     Comments: Pharynx normal, eyes normal, ears normal, nose: Bilateral lower turbinates moderately edematous and pale with clear drainage noted    Right Ear: Tympanic membrane, ear canal and external ear normal.     Left Ear: Tympanic membrane, ear canal and external ear normal.     Mouth/Throat:     Mouth: Mucous membranes are moist.     Pharynx: Oropharynx is clear.  Eyes:     Conjunctiva/sclera: Conjunctivae normal.  Cardiovascular:     Rate and Rhythm: Regular rhythm.     Heart sounds: Normal heart sounds.  Pulmonary:     Effort: Pulmonary effort is normal.     Breath sounds: Normal breath sounds.     Comments: Lungs clear to auscultation Musculoskeletal:     Cervical back: Neck supple.  Skin:    General: Skin is warm.     Comments: No rashes or urticarial lesions noted  Neurological:     Mental Status: She is alert and oriented to person, place, and time.  Psychiatric:        Mood and Affect: Mood normal.        Behavior: Behavior normal.        Thought Content: Thought content normal.        Judgment: Judgment normal.     Diagnostics:  None  Assessment and Plan: 1. Nonallergic rhinitis   2. Ear fullness, bilateral   3. Nasal  congestion   4. Severe persistent asthma without complication   5. Pruritus     No orders of the defined types were placed in this encounter.   Patient Instructions  Severe persistent asthma Continue to follow up with pulmonology for your asthma and Fasenra Daily controller medication(s): Take Dulera as per pulmonology May use albuterol rescue inhaler 2 puffs every 4 to 6 hours as needed for shortness of breath, chest tightness, coughing, and wheezing. May use albuterol rescue inhaler 2 puffs 5 to 15 minutes prior to strenuous physical activities. Monitor  frequency of use.  Asthma control goals:  Full participation in all desired activities (may need albuterol before activity) Albuterol use two times or less a week on average (not counting use with activity) Cough interfering with sleep two times or less a month Oral steroids no more than once a year No hospitalizations  Rash/itching: Continue using Cerave lotion Continue proper skin care.  Non allergic rhinitis (no use of steroid nasal sprays due to diagnosis of glaucoma) Use nasal saline lavage (i.e., NeilMed) 1-2 times per day. May use Afrin 1-2 times per week for severe nasal congestion.  Call your eye doctor to see if it is ok to use Astepro  and Zyrtec 2.5 mg since you have previously said that they did not want you on antihistamines  Heartburn  Continue dietary and lifestyle modifications. Continue with tums as needed.   Follow up as needed  Skin care recommendations  Bath time: Always use lukewarm water. AVOID very hot or cold water. Keep bathing time to 5-10 minutes. Do NOT use bubble bath. Use a mild soap and use just enough to wash the dirty areas. Do NOT scrub skin vigorously.  After bathing, pat dry your skin with a towel. Do NOT rub or scrub the skin.  Moisturizers and prescriptions:  ALWAYS apply moisturizers immediately after bathing (within 3 minutes). This helps to lock-in moisture. Use the  moisturizer several times a day over the whole body. Good summer moisturizers include: Aveeno, CeraVe, Cetaphil. Good winter moisturizers include: Aquaphor, Vaseline, Cerave, Cetaphil, Eucerin, Vanicream. When using moisturizers along with medications, the moisturizer should be applied about one hour after applying the medication to prevent diluting effect of the medication or moisturize around where you applied the medications. When not using medications, the moisturizer can be continued twice daily as maintenance.  Laundry and clothing: Avoid laundry products with added color or perfumes. Use unscented hypo-allergenic laundry products such as Tide free, Cheer free & gentle, and All free and clear.  If the skin still seems dry or sensitive, you can try double-rinsing the clothes. Avoid tight or scratchy clothing such as wool. Do not use fabric softeners or dyer sheets.  Return if symptoms worsen or fail to improve.    Thank you for the opportunity to care for this patient.  Please do not hesitate to contact me with questions.  Nehemiah Settle, FNP [facility]

## 2023-11-05 DIAGNOSIS — I10 Essential (primary) hypertension: Secondary | ICD-10-CM | POA: Diagnosis not present

## 2023-11-05 DIAGNOSIS — I725 Aneurysm of other precerebral arteries: Secondary | ICD-10-CM | POA: Diagnosis not present

## 2023-11-10 DIAGNOSIS — H401133 Primary open-angle glaucoma, bilateral, severe stage: Secondary | ICD-10-CM | POA: Diagnosis not present

## 2023-11-11 DIAGNOSIS — J4551 Severe persistent asthma with (acute) exacerbation: Secondary | ICD-10-CM | POA: Diagnosis not present

## 2023-11-19 DIAGNOSIS — I725 Aneurysm of other precerebral arteries: Secondary | ICD-10-CM | POA: Diagnosis not present

## 2023-11-19 DIAGNOSIS — M810 Age-related osteoporosis without current pathological fracture: Secondary | ICD-10-CM | POA: Diagnosis not present

## 2023-11-19 DIAGNOSIS — I10 Essential (primary) hypertension: Secondary | ICD-10-CM | POA: Diagnosis not present

## 2023-12-03 DIAGNOSIS — H401113 Primary open-angle glaucoma, right eye, severe stage: Secondary | ICD-10-CM | POA: Diagnosis not present

## 2023-12-03 DIAGNOSIS — R42 Dizziness and giddiness: Secondary | ICD-10-CM | POA: Diagnosis not present

## 2023-12-03 DIAGNOSIS — J329 Chronic sinusitis, unspecified: Secondary | ICD-10-CM | POA: Diagnosis not present

## 2023-12-03 DIAGNOSIS — E785 Hyperlipidemia, unspecified: Secondary | ICD-10-CM | POA: Diagnosis not present

## 2023-12-05 DIAGNOSIS — I10 Essential (primary) hypertension: Secondary | ICD-10-CM | POA: Diagnosis not present

## 2023-12-05 DIAGNOSIS — I725 Aneurysm of other precerebral arteries: Secondary | ICD-10-CM | POA: Diagnosis not present

## 2023-12-20 DIAGNOSIS — I725 Aneurysm of other precerebral arteries: Secondary | ICD-10-CM | POA: Diagnosis not present

## 2023-12-20 DIAGNOSIS — I10 Essential (primary) hypertension: Secondary | ICD-10-CM | POA: Diagnosis not present

## 2023-12-22 DIAGNOSIS — H401133 Primary open-angle glaucoma, bilateral, severe stage: Secondary | ICD-10-CM | POA: Diagnosis not present

## 2023-12-23 DIAGNOSIS — B351 Tinea unguium: Secondary | ICD-10-CM | POA: Diagnosis not present

## 2023-12-23 DIAGNOSIS — L600 Ingrowing nail: Secondary | ICD-10-CM | POA: Diagnosis not present

## 2023-12-25 NOTE — Progress Notes (Addendum)
 Subjective:     Patient ID: Cassandra Sims, female    DOB: 10/07/1932, 89 y.o.   MRN: [id number]  Chief Complaint  Patient presents with   transfer of care   dizzyness and staggering    Off-balance   low back pain    HPI  Patient is in today to establish care. Cassandra Sims is divorced, lives by herself.  She has 4 children.  She presents to office with her 2 daughters, Cassandra Sims and Cassandra Sims.Two of her children live within 5 miles of her home. She feels they can respond to assist her.  Ms. Zuluaga no longer driving. Her two daughters who ar present at OV provide transportation to Ms. Fosters  medical appointments.   Followed by: Opthalmology, Allergy , Cardiology, Podiatry, ENT, Endocrinology, Neurology   Hypertension- Followed by CARDIOLOGY - Dr. Zackary Heron- [facility] Meds: amlodipine  5 mg AM, 2.5 mg Hs,  BID,  and labetalol  100 mg BID, Olmesartan 40 mg HS.  Reports compliant with medications She has a history of a basilar artery aneurysm as well.  BP Readings from Last 1 Encounters:  12/30/23 119/63     Intercranial Aneurysm- followed by Neurology She followed by [facility] neurology last seen August 2024.Per Neurology, brain aneurysm is expected to continue enlarging at the brainstem, and believes her dizziness is likely r/t compression from the aneurysm in that region. The neurologist notes Pt t is very high surgical risk and does not recommend Sx, unfortunately no other tests or solutions outside of Sx tx.   CT Head 03/2023- large basilar tip aneurysm since 11/11/2022 measuring up to 2.3 cm which exerts mass effect on the optic chiasm   Severe- persistent asthma- Followed by Allergy  and Pulmonology Meds: ICS/LABA (Dulera ) + Fasenra  injections. -Dr. Elverna Hamman [facility]. & albuterol  Reports compliant with Fasenra  injections, patient says that she has not been consistently taking Dulera   Nonallergis rhinitis- followed by Allergy - followed by [facility] Astepro  nasal  spray every day, Zyrtec 2.5 mg every day Hx of two sinus surgeries- last was 10 years ago*per Allergy -avoiding steroid spray due to glaucoma.  Reports compliant with medications   Glaucoma- Followed by OPHTHALMOLOGY  Reports worsening vision, she is no longer driving.     GERD Famotidine  20 mg daily--reports she is not taking this due to nightmares  Osteoporosis- followed by ENDOCRINOLOGY  She is managed by Dr. Lydia Sams (endocrinology) for this. She is receiving Prolia injections every 6 months.  Reports compliant with medications   Anxiety Buspar 5 mg TID prn  This 3 times a day and has help with anxiety  Hx of hyperlipidemia and coronary artery calcifications.  not on a statin.  Per  cardiology note 2024 LDL goal is >130  Lab Results  Component Value Date   CHOL 191 08/02/2021   HDL 81.60 08/02/2021   LDLCALC 102 (H) 08/02/2021   TRIG 40.0 08/02/2021   CHOLHDL 2 08/02/2021    Primary Open Angle Glaucoma Managed by [facility] (ophthalmology)  Chronic Congestion Managed by allergist. Hx of multiple sinus Sx.  She is using Astepro  nasal spray 1 spray each nostril at night and Zyrtec 2.5 mg once a day. Also with chronic rhinitis, try to limit Afrin. Using Astepro /Zyrtec, avoiding steroid spray due to glaucoma.   Chronic Dizziness   Patient has history of chronic dizziness, no recent falls.  Patient denies vertigo, lightheadedness, or sensation of the room spinning. No recent falls, head trauma, or loss of consciousness. Denies associated nausea,  weakness, or numbness. No recent illness, fever, or new medications reported.  ACP Established with Palliative Care. ACP Documentation completed.  C/O Low Back Pain and vision abnormalities  Reports 1 week of lower acute back pain. 3/10. Ache/dull. Patient denies any radiating pain, numbness, or tingling in the legs. No bowel or bladder incontinence. Denies recent trauma, falls, or known injury. No fever, weight loss, or history of  malignancy   Patient denies fever, chills, SOB, CP, palpitations, dyspnea, edema, HA, N/V/D, abdominal pain, urinary symptoms, rash, weight changes, and recent illness or hospitalizations.   ---------------------------------------------------------------------------------------------------     History of Present Illness         Health Maintenance Due  Topic Date Due   DEXA SCAN  Never done   Medicare Annual Wellness (AWV)  12/22/2020   DTaP/Tdap/Td (2 - Td or Tdap) 04/29/2021   COVID-19 Vaccine (9 - Pfizer risk 2024-25 season) 11/20/2023    Past Medical History:  Diagnosis Date   Anxiety    Arthritis    Asthma    Complication of anesthesia    hard to wake up, 2009  sit up in bed to fast and BP dropped low code blue called per pt.   Family history of adverse reaction to anesthesia    sister has naseau   GERD (gastroesophageal reflux disease)    Glaucoma    Headache    hx of migrains   HOH (hard of hearing)    Hypertension    Hypoglycemia    Multiple allergies    Peripheral vascular disease (HCC)    varicose veins , Laser vein surgery   PONV (postoperative nausea and vomiting)    Pre-diabetes    Prolonged QT interval 01/13/2018   Rhinitis    S/P knee replacement 01/13/2018   Wears glasses     Past Surgical History:  Procedure Laterality Date   ABDOMINAL HYSTERECTOMY     APPENDECTOMY     BREAST SURGERY     biopsy left    COLECTOMY  2008   obstruction   DIAGNOSTIC LAPAROSCOPY     exp   DILATION AND CURETTAGE OF UTERUS     EYE SURGERY     cataract   bil and retina surgery right eye, glaucoma surgery, right eye   FUNCTIONAL ENDOSCOPIC SINUS SURGERY  2009   bilat ethm,frontal,max   KNEE ARTHROSCOPY Right 03/30/2018   Procedure: right knee scope; synovectomy;  Surgeon: Liliane Rei, MD;  Location: [facility];  Service: Orthopedics;  Laterality: Right;    NASAL SINUS SURGERY Bilateral 10/26/2012   Procedure: BILATERAL ENDOSCOPIC REVISION ETHMOID MAXILLARY AND  FRONTAL SINUS SURGERY  ;  Surgeon: Janita Mellow, MD;  Location: [facility];  Service: ENT;  Laterality: Bilateral;   OVARIAN CYST SURGERY     TOTAL KNEE ARTHROPLASTY Right 01/05/2018   Procedure: RIGHT TOTAL KNEE ARTHROPLASTY;  Surgeon: Liliane Rei, MD;  Location: [facility];  Service: Orthopedics;  Laterality: Right;   TUBAL LIGATION     URETER SURGERY     cut accidentally during exp lap-ovarien cyst   VEIN LIGATION      Family History  Problem Relation Age of Onset   Glaucoma Mother    Arthritis Mother    Lung disease Sister    Breast cancer Daughter    Diabetes Mellitus I Other    Leukemia Brother    Allergic rhinitis Neg Hx    Angioedema Neg Hx    Asthma Neg Hx    Atopy Neg  Hx    Eczema Neg Hx    Urticaria Neg Hx    Immunodeficiency Neg Hx     Social History   Socioeconomic History   Marital status: Divorced    Spouse name: Not on file   Number of children: 4   Years of education: Not on file   Highest education level: 12th grade  Occupational History   Not on file  Tobacco Use   Smoking status: Never   Smokeless tobacco: Never  Vaping Use   Vaping status: Never Used  Substance and Sexual Activity   Alcohol  use: No   Drug use: No   Sexual activity: Not on file  Other Topics Concern   Not on file  Social History Narrative   Originally from [place]. Previously lived in [place] for 14 months. Has worked in Photographer with [employer]      Lives alone      [place] and [place]--    2 Sons daughters   Social Drivers of Corporate investment banker Strain: Low Risk  (12/23/2023)   Overall Financial Resource Strain (CARDIA)    Difficulty of Paying Living Expenses: Not hard at all  Food Insecurity: No Food Insecurity (12/23/2023)   Hunger Vital Sign    Worried About Running Out of Food in the Last Year: Never true    Ran Out of Food in the Last Year: Never true  Transportation Needs: No Transportation Needs (12/23/2023)   PRAPARE - Scientist, research (physical sciences) (Medical): No    Lack of Transportation (Non-Medical): No  Physical Activity: Unknown (12/23/2023)   Exercise Vital Sign    Days of Exercise per Week: 0 days    Minutes of Exercise per Session: Not on file  Stress: No Stress Concern Present (12/23/2023)   [employer] of Occupational Health - Occupational Stress Questionnaire    Feeling of Stress : Not at all  Social Connections: Moderately Integrated (12/23/2023)   Social Connection and Isolation Panel    Frequency of Communication with Friends and Family: More than three times a week    Frequency of Social Gatherings with Friends and Family: More than three times a week    Attends Religious Services: More than 4 times per year    Active Member of [employer] or Organizations: Yes    Attends Banker Meetings: 1 to 4 times per year    Marital Status: Divorced  Intimate Partner Violence: Not At Risk (03/13/2023)   Received from [facility]   HITS    Over the last 12 months how often did your partner physically hurt you?: Never    Over the last 12 months how often did your partner insult you or talk down to you?: Never    Over the last 12 months how often did your partner threaten you with physical harm?: Never    Over the last 12 months how often did your partner scream or curse at you?: Never    Outpatient Medications Prior to Visit  Medication Sig Dispense Refill   acetaminophen  (TYLENOL ) 500 MG tablet Take 2 tablets (1,000 mg total) by mouth every 8 (eight) hours as needed for mild pain. 30 tablet 0   albuterol  (VENTOLIN  HFA) 108 (90 Base) MCG/ACT inhaler INHALE 2 PUFFS INTO THE LUNGS EVERY 4 (FOUR) HOURS AS NEEDED FOR WHEEZING. REPORTED ON 10/10/2015 6.7 each 1   amLODipine  (NORVASC ) 2.5 MG tablet Take 2.5 mg by mouth 2 (two) times daily.  apraclonidine (IOPIDINE) 0.5 % ophthalmic solution 1 drop 2 (two) times daily.     Azelastine  HCl (ASTEPRO ) 0.15 % SOLN Place 1 spray into the nose at bedtime.      benralizumab  (FASENRA ) 30 MG/ML prefilled syringe 30 mg by sub-q route.     benzonatate  (TESSALON  PERLES) 100 MG capsule Take 1 capsule (100 mg total) by mouth 3 (three) times daily as needed for cough. 42 capsule 0   busPIRone (BUSPAR) 5 MG tablet Take 5 mg by mouth 3 (three) times daily as needed.     Calcium  Carbonate Antacid (TUMS PO) Take 1,000 mg by mouth daily.     carboxymethylcellulose (REFRESH PLUS) 0.5 % SOLN Place 1 drop into both eyes 5 (five) times daily as needed (for dry eyes).     cetirizine (ZYRTEC) 5 MG tablet 1/2 tablet by mouth daily     chlorhexidine  (PERIDEX ) 0.12 % solution SWISH 15 ML BY MOUTH TWICE A DAY FOR 30 SECONDS AFTER BRUSHING TEETH, THEN SPIT OUT     Cholecalciferol  25 MCG (1000 UT) tablet Take 1,000 Units by mouth daily.     denosumab (PROLIA) 60 MG/ML SOSY injection Inject into the skin.     diclofenac Sodium (VOLTAREN) 1 % GEL APPLY 2 GRAM TO AFFECTED AREA EVERY EIGHT TO TWELVE HOURS AS NEEDED FOR PAIN     dorzolamide  (TRUSOPT ) 2 % ophthalmic solution Place 1 drop into both eyes 3 (three) times daily.     famotidine  (PEPCID ) 20 MG tablet Take 1 tablet (20 mg total) by mouth daily. 90 tablet 1   labetalol  (NORMODYNE ) 100 MG tablet Take 1 tablet by mouth 2 (two) times daily.     Lactobacillus (ACIDOPHILUS PO) Take 1 capsule by mouth daily.     LUTEIN PO Take 20 mg by mouth daily.     meclizine (ANTIVERT) 12.5 MG tablet Take by mouth.     mometasone -formoterol  (DULERA ) 100-5 MCG/ACT AERO Inhale 2 puffs into the lungs daily. (Patient taking differently: Inhale 1 puff into the lungs daily.) 1 each 5   Multiple Vitamin (MULTIVITAMIN WITH MINERALS) TABS tablet Take 1 tablet by mouth daily. Nature Made Multi Vitamin for Her 50+ W/No Iron     olmesartan (BENICAR) 40 MG tablet Take 40 mg by mouth every evening.     OVER THE COUNTER MEDICATION 4 drops daily. Serum drops     psyllium (METAMUCIL) 58.6 % powder Take 1 packet by mouth daily. 14.3 g (1 heaping tablespoon)  every morning.     ROCKLATAN 0.02-0.005 % SOLN Place 1 drop into both eyes at bedtime.     Turmeric 450 MG CAPS Take 450 mg by mouth daily with lunch.      DULERA  100-5 MCG/ACT AERO INHALE 1 PUFF INTO THE LUNGS DAILY. WITH SPACER AND RINSE MOUTH AFTERWARDS. (Patient taking differently: Inhale 1 puff into the lungs daily.) 13 each 5   Dupilumab  (DUPIXENT ) 300 MG/2ML SOPN Inject 300 mg into the skin every 14 (fourteen) days. (Patient not taking: Reported on 10/28/2023) 12 mL 1   famotidine  (PEPCID ) 20 MG tablet TAKE 1 TABLET BY MOUTH EVERY DAY 90 tablet 1   dupilumab  (DUPIXENT ) prefilled syringe 300 mg      No facility-administered medications prior to visit.    Allergies  Allergen Reactions   Penicillins Shortness Of Breath and Swelling    Has patient had a PCN reaction causing immediate rash, facial/tongue/throat swelling, SOB or lightheadedness with hypotension: Yes Has patient had a PCN reaction causing severe rash  involving mucus membranes or skin necrosis: No Has patient had a PCN reaction that required hospitalization: No Has patient had a PCN reaction occurring within the last 10 years: No If all of the above answers are NO, then may proceed with Cephalosporin use.    Zantac  [Ranitidine ] Itching   Apraclonidine Other (See Comments)    Very light sensitive, swollen eye   Brinzolamide Other (See Comments)   Doxycycline  Other (See Comments)    Pt states she tolerates OKAY   Lactose Other (See Comments)   Adhesive [Tape] Itching    BAND-AID TAPE-skin redness/itching   Clindamycin/Lincomycin Other (See Comments)    Severe joint pain.   Codeine Nausea And Vomiting   Darvocet [Propoxyphene N-Acetaminophen ] Other (See Comments)    HTN   Dilaudid [Hydromorphone Hcl] Nausea And Vomiting    DROP BLOOD PRESSURE HYPOTENSIVE   Flagyl [Metronidazole] Other (See Comments)    Joint pains   Lactose Intolerance (Gi)    Levofloxacin      Joint pain   Macrodantin [Nitrofurantoin  Macrocrystal]    Morphine And Codeine     hallucinate   Other    Pedi-Pre Tape Spray [Wound Dressing Adhesive]    Premarin [Conjugated Estrogens] Itching   Tramadol  Hcl Other (See Comments)    Causes insomnia   Vancomycin  Hives and Itching   Zofran  [Ondansetron  Hcl]     HTN   Clarithromycin     Other reaction(s): Abdominal Pain Anxious/nervous feeling   Denosumab Rash   Sulfa Antibiotics Rash    ROS See HPI    Objective:     Physical Exam  Cassandra: No acute distress. Awake and conversant. +frail Neck: Neck is supple. No masses or thyromegaly.  Respiratory: Mild wheezing. Respirations are non-labored.  Skin: Warm. No rashes, ulcers, ecchymosis Psych: Alert and oriented. Cooperative, Appropriate mood and affect, Normal judgment.  CV: RRR. No lower extremity edema.  MSK: Uses 4 wheel walker for ambulation. No clubbing or cyanosis.+ TWP LL back.  Neuro: CN II-XII grossly normal.     BP 119/63 (BP Location: Left Arm, Patient Position: Standing, Cuff Size: Normal)   Pulse 62   Temp 98.1 F (36.7 C) (Oral)   Resp 12   Ht 5' 2.5 (1.588 m)   Wt 109 lb 12.8 oz (49.8 kg)   SpO2 97%   BMI 19.76 kg/m  Wt Readings from Last 3 Encounters:  12/30/23 109 lb 12.8 oz (49.8 kg)  10/28/23 111 lb 6.4 oz (50.5 kg)  04/21/23 115 lb 1.3 oz (52.2 kg)       Assessment & Plan:   Problem List Items Addressed This Visit     Dizziness   Chronic dizziness.  Followed by [facility] health neurology.  Per neurology last note patient's brain aneurysm is contributing to dizziness and unfortunately patient is not recommended for Sx Tx by neuro due to high risk.  Discussed consideration of second opinion- daughters and patient open to this. She has been taking meclizine prn      Relevant Orders   CBC with Differential/Platelet   Comprehensive metabolic panel with GFR   B12   Magnesium    Encounter to establish care with new provider - Primary   Hyperlipidemia LDL goal <130   Lipid panel  today      Relevant Orders   Lipid panel   Hypertension   BP Readings from Last 1 Encounters:  12/30/23 119/63   She is followed by cardiology. Blood pressure stable.  No changes to medications.  Intracranial aneurysm   large basilar tip aneurysm since 11/11/2022 measuring up to 2.3 cm which exerts mass effect on the optic chiasm.      Severe persistent asthma   Reports she has not been consistent with Dulera  inhaler.  Encouraged patient to use Dulera  daily for wheezing.      Unsteady gait   Relevant Orders   CBC with Differential/Platelet   Comprehensive metabolic panel with GFR   B12   Magnesium    Other Visit Diagnoses       Other peripheral vertigo, unspecified ear       Relevant Orders   CT HEAD WO CONTRAST ( )      Back pain Encouraged moist heat and gentle stretching as tolerated. May try tylenol  OTC as directed and report if symptoms worsen or seek immediate care.    I have discontinued Alleyne B. Nobis's Dupixent . I am also having her maintain her psyllium, multivitamin with minerals, Lactobacillus (ACIDOPHILUS PO), Turmeric, carboxymethylcellulose, LUTEIN PO, labetalol , olmesartan, amLODipine , Calcium  Carbonate Antacid (TUMS PO), Cholecalciferol , Rocklatan, dorzolamide , Azelastine  HCl, acetaminophen , albuterol , benzonatate , famotidine , apraclonidine, mometasone -formoterol , benralizumab , busPIRone, cetirizine, chlorhexidine , denosumab, diclofenac Sodium, meclizine, and OVER THE COUNTER MEDICATION. We will stop administering dupilumab .  No orders of the defined types were placed in this encounter.

## 2023-12-30 ENCOUNTER — Ambulatory Visit (INDEPENDENT_AMBULATORY_CARE_PROVIDER_SITE_OTHER): Admitting: Student

## 2023-12-30 ENCOUNTER — Encounter: Payer: Self-pay | Admitting: Student

## 2023-12-30 VITALS — BP 119/63 | HR 62 | Temp 98.1°F | Resp 12 | Ht 62.5 in | Wt 109.8 lb

## 2023-12-30 DIAGNOSIS — H81399 Other peripheral vertigo, unspecified ear: Secondary | ICD-10-CM

## 2023-12-30 DIAGNOSIS — R2681 Unsteadiness on feet: Secondary | ICD-10-CM | POA: Diagnosis not present

## 2023-12-30 DIAGNOSIS — J4550 Severe persistent asthma, uncomplicated: Secondary | ICD-10-CM | POA: Insufficient documentation

## 2023-12-30 DIAGNOSIS — H814 Vertigo of central origin: Secondary | ICD-10-CM | POA: Insufficient documentation

## 2023-12-30 DIAGNOSIS — E785 Hyperlipidemia, unspecified: Secondary | ICD-10-CM | POA: Diagnosis not present

## 2023-12-30 DIAGNOSIS — I10 Essential (primary) hypertension: Secondary | ICD-10-CM

## 2023-12-30 DIAGNOSIS — I671 Cerebral aneurysm, nonruptured: Secondary | ICD-10-CM

## 2023-12-30 DIAGNOSIS — R42 Dizziness and giddiness: Secondary | ICD-10-CM | POA: Diagnosis not present

## 2023-12-30 DIAGNOSIS — Z7689 Persons encountering health services in other specified circumstances: Secondary | ICD-10-CM | POA: Insufficient documentation

## 2023-12-30 NOTE — Assessment & Plan Note (Addendum)
 Reports she has not been consistent with Dulera  inhaler.  Encouraged patient to use Dulera  daily for wheezing.

## 2023-12-30 NOTE — Assessment & Plan Note (Signed)
 Lipid panel today

## 2023-12-30 NOTE — Assessment & Plan Note (Signed)
 BP Readings from Last 1 Encounters:  12/30/23 119/63   She is followed by cardiology. Blood pressure stable.  No changes to medications.

## 2023-12-30 NOTE — Assessment & Plan Note (Signed)
 Check A1c.

## 2023-12-30 NOTE — Assessment & Plan Note (Addendum)
 Chronic dizziness.  Followed by [facility] health neurology.  Per neurology last note patient's brain aneurysm is contributing to dizziness and unfortunately patient is not recommended for Sx Tx by neuro due to high risk.  Discussed consideration of second opinion- daughters and patient open to this. She has been taking meclizine prn

## 2023-12-31 LAB — MAGNESIUM: Magnesium: 2.1 mg/dL (ref 1.5–2.5)

## 2023-12-31 LAB — LIPID PANEL
Cholesterol: 200 mg/dL (ref 0–200)
HDL: 71.9 mg/dL (ref >39.00)
LDL Cholesterol: 111 mg/dL — ABNORMAL HIGH (ref 0–99)
NonHDL: 128.54
Total CHOL/HDL Ratio: 3
Triglycerides: 86 mg/dL (ref 0.0–149.0)
VLDL: 17.2 mg/dL (ref 0.0–40.0)

## 2023-12-31 LAB — COMPREHENSIVE METABOLIC PANEL WITH GFR
ALT: 24 U/L (ref 0–35)
AST: 20 U/L (ref 0–37)
Albumin: 4.4 g/dL (ref 3.5–5.2)
Alkaline Phosphatase: 44 U/L (ref 39–117)
BUN: 28 mg/dL — ABNORMAL HIGH (ref 6–23)
CO2: 27 meq/L (ref 19–32)
Calcium: 9.2 mg/dL (ref 8.4–10.5)
Chloride: 104 meq/L (ref 96–112)
Creatinine, Ser: 0.75 mg/dL (ref 0.40–1.20)
GFR: 70.87 mL/min (ref >60.00)
Glucose, Bld: 88 mg/dL (ref 70–99)
Potassium: 3.9 meq/L (ref 3.5–5.1)
Sodium: 138 meq/L (ref 135–145)
Total Bilirubin: 0.5 mg/dL (ref 0.2–1.2)
Total Protein: 6.4 g/dL (ref 6.0–8.3)

## 2023-12-31 LAB — CBC WITH DIFFERENTIAL/PLATELET
Basophils Absolute: 0 10*3/uL (ref 0.0–0.1)
Basophils Relative: 0.8 % (ref 0.0–3.0)
Eosinophils Absolute: 0 10*3/uL (ref 0.0–0.7)
Eosinophils Relative: 0 % (ref 0.0–5.0)
HCT: 38.5 % (ref 36.0–46.0)
Hemoglobin: 12.8 g/dL (ref 12.0–15.0)
Lymphocytes Relative: 21.8 % (ref 12.0–46.0)
Lymphs Abs: 1.1 10*3/uL (ref 0.7–4.0)
MCHC: 33.3 g/dL (ref 30.0–36.0)
MCV: 93.6 fl (ref 78.0–100.0)
Monocytes Absolute: 0.9 10*3/uL (ref 0.1–1.0)
Monocytes Relative: 17.8 % — ABNORMAL HIGH (ref 3.0–12.0)
Neutro Abs: 3.1 10*3/uL (ref 1.4–7.7)
Neutrophils Relative %: 59.6 % (ref 43.0–77.0)
Platelets: 180 10*3/uL (ref 150.0–400.0)
RBC: 4.11 Mil/uL (ref 3.87–5.11)
RDW: 14.7 % (ref 11.5–15.5)
WBC: 5.2 10*3/uL (ref 4.0–10.5)

## 2024-01-02 ENCOUNTER — Encounter: Payer: Self-pay | Admitting: Student

## 2024-01-02 DIAGNOSIS — I671 Cerebral aneurysm, nonruptured: Secondary | ICD-10-CM | POA: Insufficient documentation

## 2024-01-02 NOTE — Assessment & Plan Note (Signed)
 large basilar tip aneurysm since 11/11/2022 measuring up to 2.3 cm which exerts mass effect on the optic chiasm.

## 2024-01-02 NOTE — Addendum Note (Signed)
 Addended by: Jackqueline Mason on: 01/02/2024 03:29 PM   Modules accepted: Orders, Level of Service

## 2024-01-03 LAB — VITAMIN B12: Vitamin B-12: 513 pg/mL (ref 211–911)

## 2024-01-04 DIAGNOSIS — I10 Essential (primary) hypertension: Secondary | ICD-10-CM | POA: Diagnosis not present

## 2024-01-04 DIAGNOSIS — I725 Aneurysm of other precerebral arteries: Secondary | ICD-10-CM | POA: Diagnosis not present

## 2024-01-05 ENCOUNTER — Other Ambulatory Visit: Payer: Self-pay

## 2024-01-05 ENCOUNTER — Ambulatory Visit: Payer: Self-pay | Admitting: Student

## 2024-01-05 DIAGNOSIS — I671 Cerebral aneurysm, nonruptured: Secondary | ICD-10-CM

## 2024-01-06 ENCOUNTER — Telehealth: Payer: Self-pay

## 2024-01-06 NOTE — Telephone Encounter (Signed)
 Patient and daughter was advised of message below from Sandford Croon    It was a pleasure meeting you. There are no significant abnormalities in your lab work.   Melville Stade, DNP, AGNP-BC  Copied from CRM [id number]. Topic: Clinical - Lab/Test Results >> Jan 05, 2024  4:52 PM Cassandra Sims wrote: Reason for CRM: patient calling about lab results and will available after 3pm tomorrow  Pt num [phone] (H)

## 2024-01-12 DIAGNOSIS — I671 Cerebral aneurysm, nonruptured: Secondary | ICD-10-CM | POA: Diagnosis not present

## 2024-01-13 DIAGNOSIS — J4551 Severe persistent asthma with (acute) exacerbation: Secondary | ICD-10-CM | POA: Diagnosis not present

## 2024-01-19 DIAGNOSIS — I10 Essential (primary) hypertension: Secondary | ICD-10-CM | POA: Diagnosis not present

## 2024-01-19 DIAGNOSIS — I725 Aneurysm of other precerebral arteries: Secondary | ICD-10-CM | POA: Diagnosis not present

## 2024-01-26 DIAGNOSIS — I725 Aneurysm of other precerebral arteries: Secondary | ICD-10-CM | POA: Diagnosis not present

## 2024-01-26 DIAGNOSIS — I2510 Atherosclerotic heart disease of native coronary artery without angina pectoris: Secondary | ICD-10-CM | POA: Diagnosis not present

## 2024-01-26 DIAGNOSIS — I10 Essential (primary) hypertension: Secondary | ICD-10-CM | POA: Diagnosis not present

## 2024-01-26 DIAGNOSIS — R1310 Dysphagia, unspecified: Secondary | ICD-10-CM | POA: Diagnosis not present

## 2024-01-28 ENCOUNTER — Ambulatory Visit: Admitting: *Deleted

## 2024-01-28 ENCOUNTER — Telehealth: Payer: Self-pay | Admitting: *Deleted

## 2024-01-28 VITALS — Ht 62.5 in | Wt 109.0 lb

## 2024-01-28 DIAGNOSIS — Z0000 Encounter for general adult medical examination without abnormal findings: Secondary | ICD-10-CM

## 2024-01-28 NOTE — Telephone Encounter (Signed)
 Pt had AWV today.  There was a discrepancy with the allergy  and medication list.  Apraclonidine eye drops are listed as an allergy  with reaction of light sensitivity and swelling of eye. Pt states she is currently using them without any problems.  Can you remove that from the allergy  list if that is appropriate?    Also, her daughter states she takes amlodipine  5mg  once a day and 2.5mg  once a day.  We only have 2.5mg  twice a day on her current medication list.  Are you able to confirm it is ok to change doses as pt says she is taking it?

## 2024-01-28 NOTE — Patient Instructions (Addendum)
 Cassandra Sims , Thank you for taking time out of your busy schedule to complete your Annual Wellness Visit with me. I enjoyed our conversation and look forward to speaking with you again next year. I, as well as your care team,  appreciate your ongoing commitment to your health goals. Please review the following plan we discussed and let me know if I can assist you in the future. Your Game plan/ To Do List    Follow up Visits: Next Medicare AWV with our clinical staff:  01/28/25 8:20am   Next Office Visit with your provider: 04/01/24 8:20am  Clinician Recommendations:  Aim for 30 minutes of exercise or brisk walking, 6-8 glasses of water , and 5 servings of fruits and vegetables each day.   You will need to get the following vaccines at your local pharmacy: Tetanus and Covid.     This is a list of the screening recommended for you and due dates:  Health Maintenance  Topic Date Due   DEXA scan (bone density measurement)  Never done   Medicare Annual Wellness Visit  12/22/2020   DTaP/Tdap/Td vaccine (2 - Td or Tdap) 04/29/2021   COVID-19 Vaccine (9 - Pfizer risk 2024-25 season) 11/20/2023   Flu Shot  02/20/2024   Pneumococcal Vaccine for age over 6  Completed   Zoster (Shingles) Vaccine  Completed   Hepatitis B Vaccine  Aged Out   HPV Vaccine  Aged Out   Meningitis B Vaccine  Aged Out    See attachments for Preventive Care and Fall Prevention Tips.

## 2024-01-28 NOTE — Telephone Encounter (Signed)
 Changes made per PCP authorization.

## 2024-01-28 NOTE — Progress Notes (Signed)
 Subjective:   Cassandra Sims is a 89 y.o. who presents for a Medicare Wellness preventive visit.  As a reminder, Annual Wellness Visits don't include a physical exam, and some assessments may be limited, especially if this visit is performed virtually. We may recommend an in-person follow-up visit with your provider if needed.  Visit Complete: Virtual I connected with  Cassandra Sims on 01/28/24 by a audio enabled telemedicine application and verified that I am speaking with the correct person using two identifiers.  Patient Location: Home  Provider Location: Office/Clinic  I discussed the limitations of evaluation and management by telemedicine. The patient expressed understanding and agreed to proceed.  Vital Signs: Because this visit was a virtual/telehealth visit, some criteria may be missing or patient reported. Any vitals not documented were not able to be obtained and vitals that have been documented are patient reported.  VideoDeclined- This patient declined Librarian, academic. Therefore the visit was completed with audio only.  Persons Participating in Visit: Patient.  AWV Questionnaire: No: Patient Medicare AWV questionnaire was not completed prior to this visit.  Cardiac Risk Factors include: hypertension;dyslipidemia;advanced age (>70men, >84 women)     Objective:    Today's Vitals   01/28/24 0822  Weight: 109 lb (49.4 kg)  Height: 5' 2.5 (1.588 m)   Body mass index is 19.62 kg/m.     01/28/2024   10:11 AM 11/11/2022    5:23 PM 05/21/2022    4:01 PM 03/27/2022    5:41 PM 03/24/2020   10:29 PM 06/14/2019    5:48 PM 06/13/2019   11:07 PM  Advanced Directives  Does Patient Have a Medical Advance Directive? Yes No Yes No No Yes No;Yes  Type of Estate agent of [place];Living will  Healthcare Power of [place];Living will    Healthcare Power of [place];Living will  Does patient want to make changes to medical  advance directive? No - Patient declined      Yes (ED - Information included in AVS)  Copy of Healthcare Power of Attorney in Chart? Yes - validated most recent copy scanned in chart (See row information)      Yes - validated most recent copy scanned in chart (See row information)    Current Medications (verified) Outpatient Encounter Medications as of 01/28/2024  Medication Sig   acetaminophen  (TYLENOL ) 500 MG tablet Take 2 tablets (1,000 mg total) by mouth every 8 (eight) hours as needed for mild pain.   albuterol  (VENTOLIN  HFA) 108 (90 Base) MCG/ACT inhaler INHALE 2 PUFFS INTO THE LUNGS EVERY 4 (FOUR) HOURS AS NEEDED FOR WHEEZING. REPORTED ON 10/10/2015   amLODipine  (NORVASC ) 2.5 MG tablet Take 2.5 mg by mouth 2 (two) times daily.   apraclonidine (IOPIDINE) 0.5 % ophthalmic solution 1 drop 2 (two) times daily.   Azelastine  HCl (ASTEPRO ) 0.15 % SOLN Place 1 spray into the nose at bedtime.   benralizumab  (FASENRA ) 30 MG/ML prefilled syringe 30 mg by sub-q route. (Patient taking differently: Inject every 2 months)   benzonatate  (TESSALON  PERLES) 100 MG capsule Take 1 capsule (100 mg total) by mouth 3 (three) times daily as needed for cough.   busPIRone (BUSPAR) 5 MG tablet Take 5 mg by mouth 3 (three) times daily as needed.   Calcium  Carbonate Antacid (TUMS PO) Take 1,000 mg by mouth daily.   carboxymethylcellulose (REFRESH PLUS) 0.5 % SOLN Place 1 drop into both eyes 5 (five) times daily as needed (for dry eyes).   cetirizine (ZYRTEC)  5 MG tablet 1/2 tablet by mouth daily   Cholecalciferol  25 MCG (1000 UT) tablet Take 1,000 Units by mouth daily.   denosumab (PROLIA) 60 MG/ML SOSY injection Inject into the skin.   diclofenac Sodium (VOLTAREN) 1 % GEL APPLY 2 GRAM TO AFFECTED AREA EVERY EIGHT TO TWELVE HOURS AS NEEDED FOR PAIN   dorzolamide  (TRUSOPT ) 2 % ophthalmic solution Place 1 drop into both eyes 3 (three) times daily.   famotidine  (PEPCID ) 20 MG tablet Take 1 tablet (20 mg total) by mouth  daily.   labetalol  (NORMODYNE ) 100 MG tablet Take 1 tablet by mouth 2 (two) times daily.   LUTEIN PO Take 20 mg by mouth daily.   meclizine (ANTIVERT) 12.5 MG tablet Take by mouth. (Patient taking differently: Take 12.5 mg by mouth 3 (three) times daily as needed for dizziness.)   mometasone -formoterol  (DULERA ) 100-5 MCG/ACT AERO Inhale 2 puffs into the lungs daily. (Patient taking differently: Inhale 1 puff into the lungs daily.)   Multiple Vitamin (MULTIVITAMIN WITH MINERALS) TABS tablet Take 1 tablet by mouth daily. Nature Made Multi Vitamin for Her 50+ W/No Iron   olmesartan (BENICAR) 40 MG tablet Take 40 mg by mouth every evening.   OVER THE COUNTER MEDICATION 4 drops daily. Serum drops   psyllium (METAMUCIL) 58.6 % powder Take 1 packet by mouth daily. 14.3 g (1 heaping tablespoon) every morning.   ROCKLATAN 0.02-0.005 % SOLN Place 1 drop into both eyes at bedtime.   Turmeric 450 MG CAPS Take 450 mg by mouth daily with lunch.    [DISCONTINUED] chlorhexidine  (PERIDEX ) 0.12 % solution SWISH 15 ML BY MOUTH TWICE A DAY FOR 30 SECONDS AFTER BRUSHING TEETH, THEN SPIT OUT (Patient not taking: Reported on 01/28/2024)   [DISCONTINUED] Lactobacillus (ACIDOPHILUS PO) Take 1 capsule by mouth daily. (Patient not taking: Reported on 01/28/2024)   No facility-administered encounter medications on file as of 01/28/2024.    Allergies (verified) Penicillins, Zantac  [ranitidine ], Apraclonidine, Brinzolamide, Doxycycline , Lactose, Adhesive [tape], Clindamycin/lincomycin, Codeine, Darvocet [propoxyphene n-acetaminophen ], Dilaudid [hydromorphone hcl], Flagyl [metronidazole], Lactose intolerance (gi), Levofloxacin , Macrodantin [nitrofurantoin macrocrystal], Morphine and codeine, Other, Pedi-pre tape spray [wound dressing adhesive], Premarin [conjugated estrogens], Tramadol  hcl, Vancomycin , Zofran  [ondansetron  hcl], Clarithromycin, and Sulfa antibiotics   History: Past Medical History:  Diagnosis Date   Anxiety     Arthritis    Asthma    Complication of anesthesia    hard to wake up, 2009  sit up in bed to fast and BP dropped low code blue called per pt.   Family history of adverse reaction to anesthesia    sister has naseau   GERD (gastroesophageal reflux disease)    Glaucoma    Headache    hx of migrains   HOH (hard of hearing)    Hypertension    Hypoglycemia    Multiple allergies    Peripheral vascular disease (HCC)    varicose veins , Laser vein surgery   PONV (postoperative nausea and vomiting)    Pre-diabetes    Prolonged QT interval 01/13/2018   Rhinitis    S/P knee replacement 01/13/2018   Wears glasses    Past Surgical History:  Procedure Laterality Date   ABDOMINAL HYSTERECTOMY     APPENDECTOMY     BREAST SURGERY     biopsy left    COLECTOMY  2008   obstruction   DIAGNOSTIC LAPAROSCOPY     exp   DILATION AND CURETTAGE OF UTERUS     EYE SURGERY     cataract  bil and retina surgery right eye, glaucoma surgery, right eye   FUNCTIONAL ENDOSCOPIC SINUS SURGERY  2009   bilat ethm,frontal,max   KNEE ARTHROSCOPY Right 03/30/2018   Procedure: right knee scope; synovectomy;  Surgeon: Melodi Lerner, MD;  Location: [facility];  Service: Orthopedics;  Laterality: Right;    NASAL SINUS SURGERY Bilateral 10/26/2012   Procedure: BILATERAL ENDOSCOPIC REVISION ETHMOID MAXILLARY AND FRONTAL SINUS SURGERY  ;  Surgeon: Ida Loader, MD;  Location: [facility];  Service: ENT;  Laterality: Bilateral;   OVARIAN CYST SURGERY     TOTAL KNEE ARTHROPLASTY Right 01/05/2018   Procedure: RIGHT TOTAL KNEE ARTHROPLASTY;  Surgeon: Melodi Lerner, MD;  Location: [facility];  Service: Orthopedics;  Laterality: Right;   TUBAL LIGATION     URETER SURGERY     cut accidentally during exp lap-ovarien cyst   VEIN LIGATION     Family History  Problem Relation Age of Onset   Glaucoma Mother    Arthritis Mother    Lung disease Sister    Breast cancer Daughter    Diabetes Mellitus I Other     Leukemia Brother    Allergic rhinitis Neg Hx    Angioedema Neg Hx    Asthma Neg Hx    Atopy Neg Hx    Eczema Neg Hx    Urticaria Neg Hx    Immunodeficiency Neg Hx    Social History   Socioeconomic History   Marital status: Divorced    Spouse name: Not on file   Number of children: 4   Years of education: Not on file   Highest education level: 12th grade  Occupational History   Not on file  Tobacco Use   Smoking status: Never   Smokeless tobacco: Never  Vaping Use   Vaping status: Never Used  Substance and Sexual Activity   Alcohol  use: No   Drug use: No   Sexual activity: Not on file  Other Topics Concern   Not on file  Social History Narrative   Originally from [place]. Previously lived in [place] for 14 months. Has worked in Photographer with [employer]      Lives alone      [place]--    2 Sons daughters   Social Drivers of Corporate investment banker Strain: Low Risk  (01/28/2024)   Overall Financial Resource Strain (CARDIA)    Difficulty of Paying Living Expenses: Not very hard  Food Insecurity: No Food Insecurity (01/28/2024)   Hunger Vital Sign    Worried About Running Out of Food in the Last Year: Never true    Ran Out of Food in the Last Year: Never true  Transportation Needs: No Transportation Needs (01/28/2024)   PRAPARE - Administrator, Civil Service (Medical): No    Lack of Transportation (Non-Medical): No  Physical Activity: Insufficiently Active (01/28/2024)   Exercise Vital Sign    Days of Exercise per Week: 7 days    Minutes of Exercise per Session: 20 min  Stress: No Stress Concern Present (01/28/2024)   [employer] of Occupational Health - Occupational Stress Questionnaire    Feeling of Stress: Not at all  Social Connections: Moderately Integrated (01/28/2024)   Social Connection and Isolation Panel    Frequency of Communication with Friends and Family: More than three times a week    Frequency of Social Gatherings with Friends and  Family: More than three times a week    Attends Religious Services: More than  4 times per year    Active Member of Clubs or Organizations: Yes    Attends Banker Meetings: Never    Marital Status: Divorced    Tobacco Counseling Counseling given: Not Answered    Clinical Intake:  Pre-visit preparation completed: Yes  Pain : No/denies pain (has slight headached)     BMI - recorded: 19.62 Nutritional Risks: None Diabetes: No  Lab Results  Component Value Date   HGBA1C 5.6 03/20/2018   HGBA1C 5.9 (H) 12/29/2017     How often do you need to have someone help you when you read instructions, pamphlets, or other written materials from your doctor or pharmacy?: 1 - Never (Can read material but gets 2nd opinion due to vision problems) What is the last grade level you completed in school?: 12th  Interpreter Needed?: No  Information entered by :: lolita Libra, CMA   Activities of Daily Living     01/28/2024    8:48 AM  In your present state of health, do you have any difficulty performing the following activities:  Hearing? 1  Comment slight decrease  Vision? 1  Comment has glaucoma  Difficulty concentrating or making decisions? 0  Walking or climbing stairs? 0  Dressing or bathing? 0  Doing errands, shopping? 0  Preparing Food and eating ? N  Using the Toilet? N  In the past six months, have you accidently leaked urine? N  Do you have problems with loss of bowel control? N  Managing your Medications? N  Managing your Finances? N  Housekeeping or managing your Housekeeping? N    Patient Care Team: Wheeler Harlene CROME, NP as PCP - General (Nurse Practitioner) Luke Orlan HERO, DO as Consulting Physician (Allergy ) Lilian Pierre Marsa Raddle., MD as Referring Physician (Cardiology) Joshua Ozell LABOR, DPM (Inactive) as Referring Physician (Podiatry) Pichardo-Geisinger, Ricka KIDD, MD as Referring Physician (Dermatology) Jesus Oliphant, MD as Consulting Physician  (Otolaryngology) Tobie Grange, MD as Referring Physician (Endocrinology) [facility] as Nurse Navigator [facility] and Palliative Medicine) Raelyn Betters [facility])  I have updated your Care Teams any recent Medical Services you may have received from other providers in the past year.     Assessment:   This is a routine wellness examination for Devetta.  Hearing/Vision screen Hearing Screening - Comments:: Notes slight decrease but doesn't feel it needs assessment by doctor. Vision Screening - Comments:: Up to date with [facility] (Dr Raelyn)   Goals Addressed   None    Depression Screen     01/28/2024    8:44 AM 12/30/2023    2:30 PM 07/25/2021    9:03 AM  PHQ 2/9 Scores  PHQ - 2 Score 1 0 0  PHQ- 9 Score 2 4     Fall Risk     01/28/2024    8:38 AM 12/30/2023    2:28 PM  Fall Risk   Falls in the past year? 0 0  Number falls in past yr: 0 0  Injury with Fall? 0   Risk for fall due to : Impaired balance/gait;Impaired mobility Impaired balance/gait  Follow up Education provided     MEDICARE RISK AT HOME:  Medicare Risk at Home Any stairs in or around the home?: No Home free of loose throw rugs in walkways, pet beds, electrical cords, etc?: Yes Adequate lighting in your home to reduce risk of falls?: Yes Life alert?: Yes (Has device but doesn't always wear it) Use of a cane, walker or w/c?:  Yes (cane & walker) Grab bars in the bathroom?: Yes Shower chair or bench in shower?: Yes Elevated toilet seat or a handicapped toilet?: Yes (comfort height)  TIMED UP AND GO:  Was the test performed?  No,audio  Cognitive Function: 6CIT completed        01/28/2024    8:47 AM  6CIT Screen  What Year? 0 points  What month? 0 points  What time? 0 points  Count back from 20 2 points  Months in reverse 0 points  Repeat phrase 4 points  Total Score 6 points    Immunizations Immunization History  Administered Date(s) Administered   19-influenza Whole  04/10/2017   Fluad Quad(high Dose 65+) 04/22/2014   Influenza Split 04/21/2013   Influenza Whole 04/14/2012   Influenza, High Dose Seasonal PF 04/30/2013, 04/24/2015, 05/29/2015, 05/22/2016, 04/10/2017, 03/31/2018, 05/07/2019, 04/09/2022, 05/02/2023   Influenza,inj,Quad PF,6+ Mos 04/30/2013, 04/24/2015, 05/29/2015, 05/22/2016   Influenza,inj,quad, With Preservative 04/22/2014   Influenza-Unspecified 04/14/2012, 04/21/2013, 04/30/2013, 04/22/2014, 04/24/2015, 05/29/2015, 05/29/2015, 05/22/2016, 05/22/2016, 04/10/2017, 05/10/2021   Moderna Covid-19 Fall Seasonal Vaccine 63yrs & older 10/25/2022, 05/23/2023   PFIZER Comirnaty(Gray Top)Covid-19 Tri-Sucrose Vaccine 12/26/2020   PFIZER(Purple Top)SARS-COV-2 Vaccination 08/06/2019, 08/27/2019, 05/31/2020   Pfizer Covid-19 Vaccine Bivalent Booster 11yrs & up 12/26/2020, 04/17/2021   Pneumococcal Conjugate,unspecified 07/22/2010   Pneumococcal Conjugate-13 12/10/2008, 07/22/2010, 07/22/2010, 05/29/2015   Pneumococcal Polysaccharide-23 12/10/2008, 03/29/2011   Pneumococcal-Unspecified 12/10/2008   Tdap 04/30/2011   Zoster Recombinant(Shingrix) 11/15/2016, 02/04/2017   Zoster, Live 04/30/2011   Zoster, Unspecified 11/15/2016, 02/04/2017    Screening Tests Health Maintenance  Topic Date Due   Medicare Annual Wellness (AWV)  12/22/2020   DTaP/Tdap/Td (2 - Td or Tdap) 04/29/2021   COVID-19 Vaccine (9 - Pfizer risk 2024-25 season) 11/20/2023   INFLUENZA VACCINE  02/20/2024   Pneumococcal Vaccine: 50+ Years  Completed   DEXA SCAN  Completed   Zoster Vaccines- Shingrix  Completed   Hepatitis B Vaccines  Aged Out   HPV VACCINES  Aged Out   Meningococcal B Vaccine  Aged Out    Health Maintenance  Health Maintenance Due  Topic Date Due   Medicare Annual Wellness (AWV)  12/22/2020   DTaP/Tdap/Td (2 - Td or Tdap) 04/29/2021   COVID-19 Vaccine (9 - Pfizer risk 2024-25 season) 11/20/2023   Health Maintenance Items Addressed: Will get tetanus  booster and Covid vaccines at pharmacy. Will get most recent DEXA from Dr Tobie.  Additional Screening:  Vision Screening: Recommended annual ophthalmology exams for early detection of glaucoma and other disorders of the eye. Would you like a referral to an eye doctor? No    Dental Screening: Recommended annual dental exams for proper oral hygiene  Community Resource Referral / Chronic Care Management: CRR required this visit?  No   CCM required this visit?  No   Plan:    I have personally reviewed and noted the following in the patient's chart:   Medical and social history Use of alcohol , tobacco or illicit drugs  Current medications and supplements including opioid prescriptions. Patient is not currently taking opioid prescriptions. Functional ability and status Nutritional status Physical activity Advanced directives List of other physicians Hospitalizations, surgeries, and ER visits in previous 12 months Vitals Screenings to include cognitive, depression, and falls Referrals and appointments  In addition, I have reviewed and discussed with patient certain preventive protocols, quality metrics, and best practice recommendations. A written personalized care plan for preventive services as well as general preventive health recommendations were provided to patient.  Lolita Libra, CMA   01/28/2024   After Visit Summary: (MyChart) Due to this being a telephonic visit, the after visit summary with patients personalized plan was offered to patient via MyChart   Notes: see phone note

## 2024-02-03 ENCOUNTER — Telehealth: Payer: Self-pay

## 2024-02-03 DIAGNOSIS — R2681 Unsteadiness on feet: Secondary | ICD-10-CM

## 2024-02-03 DIAGNOSIS — R42 Dizziness and giddiness: Secondary | ICD-10-CM

## 2024-02-03 DIAGNOSIS — I671 Cerebral aneurysm, nonruptured: Secondary | ICD-10-CM

## 2024-02-03 NOTE — Telephone Encounter (Signed)
 Copied from CRM [id number]. Topic: Referral - Request for Referral >> Feb 03, 2024 10:09 AM [place] J wrote: Did the patient discuss referral with their provider in the last year? Yes (If No - schedule appointment) (If Yes - send message)  Appointment offered? No  Type of order/referral and detailed reason for visit:  The patient's nurse Leone) at [employer] [facility] is calling to see if the patient could have a referral to physical therapy for an evaluation since her company does not offer that service. The patient is needing a rollator because the current walker she has is too heavy for her arms. The nurse also wanted to be sure her eyesight was doing well. If referral is not possible that is okay to-- she just wanted to see what options we can give the patient and see if physical therapy had a specific recommendation with what kind of rollator she would be needing after an evaluation.    Preference of office, provider, location: No preference.  If referral order, have you been seen by this specialty before? No (If Yes, this issue or another issue? When? Where?  Can we respond through MyChart? No Please call nurse Randine back with an update on referral: [phone]

## 2024-02-03 NOTE — Telephone Encounter (Signed)
 HH orders placed. Rollator walker ordered, message sent to our contacts at [facility].

## 2024-02-04 DIAGNOSIS — H16223 Keratoconjunctivitis sicca, not specified as Sjogren's, bilateral: Secondary | ICD-10-CM | POA: Diagnosis not present

## 2024-02-05 ENCOUNTER — Telehealth: Payer: Self-pay

## 2024-02-05 DIAGNOSIS — I700 Atherosclerosis of aorta: Secondary | ICD-10-CM | POA: Diagnosis not present

## 2024-02-05 DIAGNOSIS — I671 Cerebral aneurysm, nonruptured: Secondary | ICD-10-CM | POA: Diagnosis not present

## 2024-02-05 DIAGNOSIS — M26629 Arthralgia of temporomandibular joint, unspecified side: Secondary | ICD-10-CM | POA: Diagnosis not present

## 2024-02-05 DIAGNOSIS — I739 Peripheral vascular disease, unspecified: Secondary | ICD-10-CM | POA: Diagnosis not present

## 2024-02-05 DIAGNOSIS — Z961 Presence of intraocular lens: Secondary | ICD-10-CM | POA: Diagnosis not present

## 2024-02-05 DIAGNOSIS — J4550 Severe persistent asthma, uncomplicated: Secondary | ICD-10-CM | POA: Diagnosis not present

## 2024-02-05 DIAGNOSIS — F5104 Psychophysiologic insomnia: Secondary | ICD-10-CM | POA: Diagnosis not present

## 2024-02-05 DIAGNOSIS — H35371 Puckering of macula, right eye: Secondary | ICD-10-CM | POA: Diagnosis not present

## 2024-02-05 DIAGNOSIS — M545 Low back pain, unspecified: Secondary | ICD-10-CM | POA: Diagnosis not present

## 2024-02-05 DIAGNOSIS — H9113 Presbycusis, bilateral: Secondary | ICD-10-CM | POA: Diagnosis not present

## 2024-02-05 DIAGNOSIS — H401190 Primary open-angle glaucoma, unspecified eye, stage unspecified: Secondary | ICD-10-CM | POA: Diagnosis not present

## 2024-02-05 DIAGNOSIS — M179 Osteoarthritis of knee, unspecified: Secondary | ICD-10-CM | POA: Diagnosis not present

## 2024-02-05 DIAGNOSIS — F419 Anxiety disorder, unspecified: Secondary | ICD-10-CM | POA: Diagnosis not present

## 2024-02-05 DIAGNOSIS — J984 Other disorders of lung: Secondary | ICD-10-CM | POA: Diagnosis not present

## 2024-02-05 DIAGNOSIS — E785 Hyperlipidemia, unspecified: Secondary | ICD-10-CM | POA: Diagnosis not present

## 2024-02-05 DIAGNOSIS — M810 Age-related osteoporosis without current pathological fracture: Secondary | ICD-10-CM | POA: Diagnosis not present

## 2024-02-05 DIAGNOSIS — L299 Pruritus, unspecified: Secondary | ICD-10-CM | POA: Diagnosis not present

## 2024-02-05 DIAGNOSIS — Z96659 Presence of unspecified artificial knee joint: Secondary | ICD-10-CM | POA: Diagnosis not present

## 2024-02-05 DIAGNOSIS — J439 Emphysema, unspecified: Secondary | ICD-10-CM | POA: Diagnosis not present

## 2024-02-05 DIAGNOSIS — Z604 Social exclusion and rejection: Secondary | ICD-10-CM | POA: Diagnosis not present

## 2024-02-05 DIAGNOSIS — K219 Gastro-esophageal reflux disease without esophagitis: Secondary | ICD-10-CM | POA: Diagnosis not present

## 2024-02-05 DIAGNOSIS — H9312 Tinnitus, left ear: Secondary | ICD-10-CM | POA: Diagnosis not present

## 2024-02-05 DIAGNOSIS — I10 Essential (primary) hypertension: Secondary | ICD-10-CM | POA: Diagnosis not present

## 2024-02-05 DIAGNOSIS — K5900 Constipation, unspecified: Secondary | ICD-10-CM | POA: Diagnosis not present

## 2024-02-05 DIAGNOSIS — I051 Rheumatic mitral insufficiency: Secondary | ICD-10-CM | POA: Diagnosis not present

## 2024-02-05 NOTE — Telephone Encounter (Signed)
 Copied from CRM [id number]. Topic: Clinical - Home Health Verbal Orders >> Feb 05, 2024 10:49 AM Franky GRADE wrote: Caller/Agency: [facility]  Callback Number: [phone] Service Requested: Physical Therapy Frequency: 1 time a week for 9 weeks Any new concerns about the patient? Yes, patient has been experiencing back pain.

## 2024-02-05 NOTE — Telephone Encounter (Signed)
 Spoke w/ [facility], verbal orders given. Informed to have Pt schedule appt w/ PCP if back pain not improving.

## 2024-02-11 DIAGNOSIS — I051 Rheumatic mitral insufficiency: Secondary | ICD-10-CM | POA: Diagnosis not present

## 2024-02-11 DIAGNOSIS — I671 Cerebral aneurysm, nonruptured: Secondary | ICD-10-CM | POA: Diagnosis not present

## 2024-02-11 DIAGNOSIS — F5104 Psychophysiologic insomnia: Secondary | ICD-10-CM | POA: Diagnosis not present

## 2024-02-11 DIAGNOSIS — I10 Essential (primary) hypertension: Secondary | ICD-10-CM | POA: Diagnosis not present

## 2024-02-11 DIAGNOSIS — M26629 Arthralgia of temporomandibular joint, unspecified side: Secondary | ICD-10-CM | POA: Diagnosis not present

## 2024-02-11 DIAGNOSIS — M545 Low back pain, unspecified: Secondary | ICD-10-CM | POA: Diagnosis not present

## 2024-02-11 DIAGNOSIS — H9312 Tinnitus, left ear: Secondary | ICD-10-CM | POA: Diagnosis not present

## 2024-02-11 DIAGNOSIS — H9113 Presbycusis, bilateral: Secondary | ICD-10-CM | POA: Diagnosis not present

## 2024-02-11 DIAGNOSIS — I700 Atherosclerosis of aorta: Secondary | ICD-10-CM | POA: Diagnosis not present

## 2024-02-11 DIAGNOSIS — H35371 Puckering of macula, right eye: Secondary | ICD-10-CM | POA: Diagnosis not present

## 2024-02-11 DIAGNOSIS — L299 Pruritus, unspecified: Secondary | ICD-10-CM | POA: Diagnosis not present

## 2024-02-11 DIAGNOSIS — H401190 Primary open-angle glaucoma, unspecified eye, stage unspecified: Secondary | ICD-10-CM | POA: Diagnosis not present

## 2024-02-11 DIAGNOSIS — I739 Peripheral vascular disease, unspecified: Secondary | ICD-10-CM | POA: Diagnosis not present

## 2024-02-11 DIAGNOSIS — J439 Emphysema, unspecified: Secondary | ICD-10-CM | POA: Diagnosis not present

## 2024-02-11 DIAGNOSIS — Z604 Social exclusion and rejection: Secondary | ICD-10-CM | POA: Diagnosis not present

## 2024-02-11 DIAGNOSIS — M179 Osteoarthritis of knee, unspecified: Secondary | ICD-10-CM | POA: Diagnosis not present

## 2024-02-11 DIAGNOSIS — Z961 Presence of intraocular lens: Secondary | ICD-10-CM | POA: Diagnosis not present

## 2024-02-11 DIAGNOSIS — J984 Other disorders of lung: Secondary | ICD-10-CM | POA: Diagnosis not present

## 2024-02-11 DIAGNOSIS — J4550 Severe persistent asthma, uncomplicated: Secondary | ICD-10-CM | POA: Diagnosis not present

## 2024-02-11 DIAGNOSIS — E785 Hyperlipidemia, unspecified: Secondary | ICD-10-CM | POA: Diagnosis not present

## 2024-02-11 DIAGNOSIS — M810 Age-related osteoporosis without current pathological fracture: Secondary | ICD-10-CM | POA: Diagnosis not present

## 2024-02-11 DIAGNOSIS — F419 Anxiety disorder, unspecified: Secondary | ICD-10-CM | POA: Diagnosis not present

## 2024-02-11 DIAGNOSIS — Z96659 Presence of unspecified artificial knee joint: Secondary | ICD-10-CM | POA: Diagnosis not present

## 2024-02-11 DIAGNOSIS — K5900 Constipation, unspecified: Secondary | ICD-10-CM | POA: Diagnosis not present

## 2024-02-11 DIAGNOSIS — K219 Gastro-esophageal reflux disease without esophagitis: Secondary | ICD-10-CM | POA: Diagnosis not present

## 2024-02-16 DIAGNOSIS — K219 Gastro-esophageal reflux disease without esophagitis: Secondary | ICD-10-CM | POA: Diagnosis not present

## 2024-02-16 DIAGNOSIS — H401190 Primary open-angle glaucoma, unspecified eye, stage unspecified: Secondary | ICD-10-CM | POA: Diagnosis not present

## 2024-02-16 DIAGNOSIS — F5104 Psychophysiologic insomnia: Secondary | ICD-10-CM | POA: Diagnosis not present

## 2024-02-16 DIAGNOSIS — M179 Osteoarthritis of knee, unspecified: Secondary | ICD-10-CM | POA: Diagnosis not present

## 2024-02-16 DIAGNOSIS — F419 Anxiety disorder, unspecified: Secondary | ICD-10-CM | POA: Diagnosis not present

## 2024-02-16 DIAGNOSIS — I051 Rheumatic mitral insufficiency: Secondary | ICD-10-CM | POA: Diagnosis not present

## 2024-02-16 DIAGNOSIS — Z604 Social exclusion and rejection: Secondary | ICD-10-CM | POA: Diagnosis not present

## 2024-02-16 DIAGNOSIS — J439 Emphysema, unspecified: Secondary | ICD-10-CM | POA: Diagnosis not present

## 2024-02-16 DIAGNOSIS — J4550 Severe persistent asthma, uncomplicated: Secondary | ICD-10-CM | POA: Diagnosis not present

## 2024-02-16 DIAGNOSIS — H35371 Puckering of macula, right eye: Secondary | ICD-10-CM | POA: Diagnosis not present

## 2024-02-16 DIAGNOSIS — I700 Atherosclerosis of aorta: Secondary | ICD-10-CM | POA: Diagnosis not present

## 2024-02-16 DIAGNOSIS — J984 Other disorders of lung: Secondary | ICD-10-CM | POA: Diagnosis not present

## 2024-02-16 DIAGNOSIS — Z961 Presence of intraocular lens: Secondary | ICD-10-CM | POA: Diagnosis not present

## 2024-02-16 DIAGNOSIS — H9113 Presbycusis, bilateral: Secondary | ICD-10-CM | POA: Diagnosis not present

## 2024-02-16 DIAGNOSIS — I10 Essential (primary) hypertension: Secondary | ICD-10-CM | POA: Diagnosis not present

## 2024-02-16 DIAGNOSIS — E785 Hyperlipidemia, unspecified: Secondary | ICD-10-CM | POA: Diagnosis not present

## 2024-02-16 DIAGNOSIS — M810 Age-related osteoporosis without current pathological fracture: Secondary | ICD-10-CM | POA: Diagnosis not present

## 2024-02-16 DIAGNOSIS — H9312 Tinnitus, left ear: Secondary | ICD-10-CM | POA: Diagnosis not present

## 2024-02-16 DIAGNOSIS — I739 Peripheral vascular disease, unspecified: Secondary | ICD-10-CM | POA: Diagnosis not present

## 2024-02-16 DIAGNOSIS — M545 Low back pain, unspecified: Secondary | ICD-10-CM | POA: Diagnosis not present

## 2024-02-16 DIAGNOSIS — Z96659 Presence of unspecified artificial knee joint: Secondary | ICD-10-CM | POA: Diagnosis not present

## 2024-02-16 DIAGNOSIS — K5900 Constipation, unspecified: Secondary | ICD-10-CM | POA: Diagnosis not present

## 2024-02-16 DIAGNOSIS — L299 Pruritus, unspecified: Secondary | ICD-10-CM | POA: Diagnosis not present

## 2024-02-16 DIAGNOSIS — I671 Cerebral aneurysm, nonruptured: Secondary | ICD-10-CM | POA: Diagnosis not present

## 2024-02-16 DIAGNOSIS — M26629 Arthralgia of temporomandibular joint, unspecified side: Secondary | ICD-10-CM | POA: Diagnosis not present

## 2024-02-19 DIAGNOSIS — H402233 Chronic angle-closure glaucoma, bilateral, severe stage: Secondary | ICD-10-CM | POA: Diagnosis not present

## 2024-02-19 DIAGNOSIS — R5381 Other malaise: Secondary | ICD-10-CM | POA: Diagnosis not present

## 2024-02-19 DIAGNOSIS — J4551 Severe persistent asthma with (acute) exacerbation: Secondary | ICD-10-CM | POA: Diagnosis not present

## 2024-02-24 DIAGNOSIS — J4550 Severe persistent asthma, uncomplicated: Secondary | ICD-10-CM

## 2024-02-24 DIAGNOSIS — M179 Osteoarthritis of knee, unspecified: Secondary | ICD-10-CM

## 2024-02-24 DIAGNOSIS — K219 Gastro-esophageal reflux disease without esophagitis: Secondary | ICD-10-CM

## 2024-02-24 DIAGNOSIS — I700 Atherosclerosis of aorta: Secondary | ICD-10-CM | POA: Diagnosis not present

## 2024-02-24 DIAGNOSIS — I051 Rheumatic mitral insufficiency: Secondary | ICD-10-CM

## 2024-02-24 DIAGNOSIS — F419 Anxiety disorder, unspecified: Secondary | ICD-10-CM

## 2024-02-24 DIAGNOSIS — I10 Essential (primary) hypertension: Secondary | ICD-10-CM

## 2024-02-24 DIAGNOSIS — K5900 Constipation, unspecified: Secondary | ICD-10-CM

## 2024-02-24 DIAGNOSIS — J984 Other disorders of lung: Secondary | ICD-10-CM

## 2024-02-24 DIAGNOSIS — I739 Peripheral vascular disease, unspecified: Secondary | ICD-10-CM | POA: Diagnosis not present

## 2024-02-24 DIAGNOSIS — J439 Emphysema, unspecified: Secondary | ICD-10-CM | POA: Diagnosis not present

## 2024-02-24 DIAGNOSIS — I671 Cerebral aneurysm, nonruptured: Secondary | ICD-10-CM | POA: Diagnosis not present

## 2024-02-27 DIAGNOSIS — L600 Ingrowing nail: Secondary | ICD-10-CM | POA: Diagnosis not present

## 2024-03-02 DIAGNOSIS — M26629 Arthralgia of temporomandibular joint, unspecified side: Secondary | ICD-10-CM | POA: Diagnosis not present

## 2024-03-02 DIAGNOSIS — K5900 Constipation, unspecified: Secondary | ICD-10-CM | POA: Diagnosis not present

## 2024-03-02 DIAGNOSIS — E785 Hyperlipidemia, unspecified: Secondary | ICD-10-CM | POA: Diagnosis not present

## 2024-03-02 DIAGNOSIS — M179 Osteoarthritis of knee, unspecified: Secondary | ICD-10-CM | POA: Diagnosis not present

## 2024-03-02 DIAGNOSIS — K219 Gastro-esophageal reflux disease without esophagitis: Secondary | ICD-10-CM | POA: Diagnosis not present

## 2024-03-02 DIAGNOSIS — H401190 Primary open-angle glaucoma, unspecified eye, stage unspecified: Secondary | ICD-10-CM | POA: Diagnosis not present

## 2024-03-02 DIAGNOSIS — M545 Low back pain, unspecified: Secondary | ICD-10-CM | POA: Diagnosis not present

## 2024-03-02 DIAGNOSIS — Z96659 Presence of unspecified artificial knee joint: Secondary | ICD-10-CM | POA: Diagnosis not present

## 2024-03-02 DIAGNOSIS — Z604 Social exclusion and rejection: Secondary | ICD-10-CM | POA: Diagnosis not present

## 2024-03-02 DIAGNOSIS — Z961 Presence of intraocular lens: Secondary | ICD-10-CM | POA: Diagnosis not present

## 2024-03-02 DIAGNOSIS — F5104 Psychophysiologic insomnia: Secondary | ICD-10-CM | POA: Diagnosis not present

## 2024-03-02 DIAGNOSIS — J439 Emphysema, unspecified: Secondary | ICD-10-CM | POA: Diagnosis not present

## 2024-03-02 DIAGNOSIS — J984 Other disorders of lung: Secondary | ICD-10-CM | POA: Diagnosis not present

## 2024-03-02 DIAGNOSIS — H35371 Puckering of macula, right eye: Secondary | ICD-10-CM | POA: Diagnosis not present

## 2024-03-02 DIAGNOSIS — M810 Age-related osteoporosis without current pathological fracture: Secondary | ICD-10-CM | POA: Diagnosis not present

## 2024-03-02 DIAGNOSIS — I700 Atherosclerosis of aorta: Secondary | ICD-10-CM | POA: Diagnosis not present

## 2024-03-02 DIAGNOSIS — I671 Cerebral aneurysm, nonruptured: Secondary | ICD-10-CM | POA: Diagnosis not present

## 2024-03-02 DIAGNOSIS — H9312 Tinnitus, left ear: Secondary | ICD-10-CM | POA: Diagnosis not present

## 2024-03-02 DIAGNOSIS — I10 Essential (primary) hypertension: Secondary | ICD-10-CM | POA: Diagnosis not present

## 2024-03-02 DIAGNOSIS — H9113 Presbycusis, bilateral: Secondary | ICD-10-CM | POA: Diagnosis not present

## 2024-03-02 DIAGNOSIS — J4550 Severe persistent asthma, uncomplicated: Secondary | ICD-10-CM | POA: Diagnosis not present

## 2024-03-02 DIAGNOSIS — I051 Rheumatic mitral insufficiency: Secondary | ICD-10-CM | POA: Diagnosis not present

## 2024-03-02 DIAGNOSIS — I739 Peripheral vascular disease, unspecified: Secondary | ICD-10-CM | POA: Diagnosis not present

## 2024-03-02 DIAGNOSIS — F419 Anxiety disorder, unspecified: Secondary | ICD-10-CM | POA: Diagnosis not present

## 2024-03-02 DIAGNOSIS — L299 Pruritus, unspecified: Secondary | ICD-10-CM | POA: Diagnosis not present

## 2024-03-06 DIAGNOSIS — H9312 Tinnitus, left ear: Secondary | ICD-10-CM | POA: Diagnosis not present

## 2024-03-06 DIAGNOSIS — J4550 Severe persistent asthma, uncomplicated: Secondary | ICD-10-CM | POA: Diagnosis not present

## 2024-03-06 DIAGNOSIS — F5104 Psychophysiologic insomnia: Secondary | ICD-10-CM | POA: Diagnosis not present

## 2024-03-06 DIAGNOSIS — Z96659 Presence of unspecified artificial knee joint: Secondary | ICD-10-CM | POA: Diagnosis not present

## 2024-03-06 DIAGNOSIS — K219 Gastro-esophageal reflux disease without esophagitis: Secondary | ICD-10-CM | POA: Diagnosis not present

## 2024-03-06 DIAGNOSIS — I051 Rheumatic mitral insufficiency: Secondary | ICD-10-CM | POA: Diagnosis not present

## 2024-03-06 DIAGNOSIS — I700 Atherosclerosis of aorta: Secondary | ICD-10-CM | POA: Diagnosis not present

## 2024-03-06 DIAGNOSIS — I10 Essential (primary) hypertension: Secondary | ICD-10-CM | POA: Diagnosis not present

## 2024-03-06 DIAGNOSIS — Z604 Social exclusion and rejection: Secondary | ICD-10-CM | POA: Diagnosis not present

## 2024-03-06 DIAGNOSIS — H9113 Presbycusis, bilateral: Secondary | ICD-10-CM | POA: Diagnosis not present

## 2024-03-06 DIAGNOSIS — J439 Emphysema, unspecified: Secondary | ICD-10-CM | POA: Diagnosis not present

## 2024-03-06 DIAGNOSIS — M810 Age-related osteoporosis without current pathological fracture: Secondary | ICD-10-CM | POA: Diagnosis not present

## 2024-03-06 DIAGNOSIS — M179 Osteoarthritis of knee, unspecified: Secondary | ICD-10-CM | POA: Diagnosis not present

## 2024-03-06 DIAGNOSIS — J984 Other disorders of lung: Secondary | ICD-10-CM | POA: Diagnosis not present

## 2024-03-06 DIAGNOSIS — Z961 Presence of intraocular lens: Secondary | ICD-10-CM | POA: Diagnosis not present

## 2024-03-06 DIAGNOSIS — E785 Hyperlipidemia, unspecified: Secondary | ICD-10-CM | POA: Diagnosis not present

## 2024-03-06 DIAGNOSIS — F419 Anxiety disorder, unspecified: Secondary | ICD-10-CM | POA: Diagnosis not present

## 2024-03-06 DIAGNOSIS — K5900 Constipation, unspecified: Secondary | ICD-10-CM | POA: Diagnosis not present

## 2024-03-06 DIAGNOSIS — H401190 Primary open-angle glaucoma, unspecified eye, stage unspecified: Secondary | ICD-10-CM | POA: Diagnosis not present

## 2024-03-06 DIAGNOSIS — I739 Peripheral vascular disease, unspecified: Secondary | ICD-10-CM | POA: Diagnosis not present

## 2024-03-06 DIAGNOSIS — I671 Cerebral aneurysm, nonruptured: Secondary | ICD-10-CM | POA: Diagnosis not present

## 2024-03-06 DIAGNOSIS — M26629 Arthralgia of temporomandibular joint, unspecified side: Secondary | ICD-10-CM | POA: Diagnosis not present

## 2024-03-06 DIAGNOSIS — L299 Pruritus, unspecified: Secondary | ICD-10-CM | POA: Diagnosis not present

## 2024-03-06 DIAGNOSIS — H35371 Puckering of macula, right eye: Secondary | ICD-10-CM | POA: Diagnosis not present

## 2024-03-06 DIAGNOSIS — M545 Low back pain, unspecified: Secondary | ICD-10-CM | POA: Diagnosis not present

## 2024-03-10 DIAGNOSIS — L299 Pruritus, unspecified: Secondary | ICD-10-CM | POA: Diagnosis not present

## 2024-03-10 DIAGNOSIS — K5900 Constipation, unspecified: Secondary | ICD-10-CM | POA: Diagnosis not present

## 2024-03-10 DIAGNOSIS — I10 Essential (primary) hypertension: Secondary | ICD-10-CM | POA: Diagnosis not present

## 2024-03-10 DIAGNOSIS — I671 Cerebral aneurysm, nonruptured: Secondary | ICD-10-CM | POA: Diagnosis not present

## 2024-03-10 DIAGNOSIS — F419 Anxiety disorder, unspecified: Secondary | ICD-10-CM | POA: Diagnosis not present

## 2024-03-10 DIAGNOSIS — M810 Age-related osteoporosis without current pathological fracture: Secondary | ICD-10-CM | POA: Diagnosis not present

## 2024-03-10 DIAGNOSIS — Z604 Social exclusion and rejection: Secondary | ICD-10-CM | POA: Diagnosis not present

## 2024-03-10 DIAGNOSIS — J984 Other disorders of lung: Secondary | ICD-10-CM | POA: Diagnosis not present

## 2024-03-10 DIAGNOSIS — I051 Rheumatic mitral insufficiency: Secondary | ICD-10-CM | POA: Diagnosis not present

## 2024-03-10 DIAGNOSIS — I700 Atherosclerosis of aorta: Secondary | ICD-10-CM | POA: Diagnosis not present

## 2024-03-10 DIAGNOSIS — H35371 Puckering of macula, right eye: Secondary | ICD-10-CM | POA: Diagnosis not present

## 2024-03-10 DIAGNOSIS — H9312 Tinnitus, left ear: Secondary | ICD-10-CM | POA: Diagnosis not present

## 2024-03-10 DIAGNOSIS — M545 Low back pain, unspecified: Secondary | ICD-10-CM | POA: Diagnosis not present

## 2024-03-10 DIAGNOSIS — Z961 Presence of intraocular lens: Secondary | ICD-10-CM | POA: Diagnosis not present

## 2024-03-10 DIAGNOSIS — J4550 Severe persistent asthma, uncomplicated: Secondary | ICD-10-CM | POA: Diagnosis not present

## 2024-03-10 DIAGNOSIS — H9113 Presbycusis, bilateral: Secondary | ICD-10-CM | POA: Diagnosis not present

## 2024-03-10 DIAGNOSIS — M179 Osteoarthritis of knee, unspecified: Secondary | ICD-10-CM | POA: Diagnosis not present

## 2024-03-10 DIAGNOSIS — F5104 Psychophysiologic insomnia: Secondary | ICD-10-CM | POA: Diagnosis not present

## 2024-03-10 DIAGNOSIS — M26629 Arthralgia of temporomandibular joint, unspecified side: Secondary | ICD-10-CM | POA: Diagnosis not present

## 2024-03-10 DIAGNOSIS — K219 Gastro-esophageal reflux disease without esophagitis: Secondary | ICD-10-CM | POA: Diagnosis not present

## 2024-03-10 DIAGNOSIS — H401190 Primary open-angle glaucoma, unspecified eye, stage unspecified: Secondary | ICD-10-CM | POA: Diagnosis not present

## 2024-03-10 DIAGNOSIS — J439 Emphysema, unspecified: Secondary | ICD-10-CM | POA: Diagnosis not present

## 2024-03-10 DIAGNOSIS — E785 Hyperlipidemia, unspecified: Secondary | ICD-10-CM | POA: Diagnosis not present

## 2024-03-10 DIAGNOSIS — Z96659 Presence of unspecified artificial knee joint: Secondary | ICD-10-CM | POA: Diagnosis not present

## 2024-03-10 DIAGNOSIS — I739 Peripheral vascular disease, unspecified: Secondary | ICD-10-CM | POA: Diagnosis not present

## 2024-03-15 DIAGNOSIS — Z96659 Presence of unspecified artificial knee joint: Secondary | ICD-10-CM | POA: Diagnosis not present

## 2024-03-15 DIAGNOSIS — Z604 Social exclusion and rejection: Secondary | ICD-10-CM | POA: Diagnosis not present

## 2024-03-15 DIAGNOSIS — I700 Atherosclerosis of aorta: Secondary | ICD-10-CM | POA: Diagnosis not present

## 2024-03-15 DIAGNOSIS — F419 Anxiety disorder, unspecified: Secondary | ICD-10-CM | POA: Diagnosis not present

## 2024-03-15 DIAGNOSIS — H9312 Tinnitus, left ear: Secondary | ICD-10-CM | POA: Diagnosis not present

## 2024-03-15 DIAGNOSIS — M26629 Arthralgia of temporomandibular joint, unspecified side: Secondary | ICD-10-CM | POA: Diagnosis not present

## 2024-03-15 DIAGNOSIS — I671 Cerebral aneurysm, nonruptured: Secondary | ICD-10-CM | POA: Diagnosis not present

## 2024-03-15 DIAGNOSIS — I739 Peripheral vascular disease, unspecified: Secondary | ICD-10-CM | POA: Diagnosis not present

## 2024-03-15 DIAGNOSIS — J4550 Severe persistent asthma, uncomplicated: Secondary | ICD-10-CM | POA: Diagnosis not present

## 2024-03-15 DIAGNOSIS — F5104 Psychophysiologic insomnia: Secondary | ICD-10-CM | POA: Diagnosis not present

## 2024-03-15 DIAGNOSIS — K5900 Constipation, unspecified: Secondary | ICD-10-CM | POA: Diagnosis not present

## 2024-03-15 DIAGNOSIS — E785 Hyperlipidemia, unspecified: Secondary | ICD-10-CM | POA: Diagnosis not present

## 2024-03-15 DIAGNOSIS — L299 Pruritus, unspecified: Secondary | ICD-10-CM | POA: Diagnosis not present

## 2024-03-15 DIAGNOSIS — J984 Other disorders of lung: Secondary | ICD-10-CM | POA: Diagnosis not present

## 2024-03-15 DIAGNOSIS — M545 Low back pain, unspecified: Secondary | ICD-10-CM | POA: Diagnosis not present

## 2024-03-15 DIAGNOSIS — H9113 Presbycusis, bilateral: Secondary | ICD-10-CM | POA: Diagnosis not present

## 2024-03-15 DIAGNOSIS — H35371 Puckering of macula, right eye: Secondary | ICD-10-CM | POA: Diagnosis not present

## 2024-03-15 DIAGNOSIS — M810 Age-related osteoporosis without current pathological fracture: Secondary | ICD-10-CM | POA: Diagnosis not present

## 2024-03-15 DIAGNOSIS — H401190 Primary open-angle glaucoma, unspecified eye, stage unspecified: Secondary | ICD-10-CM | POA: Diagnosis not present

## 2024-03-15 DIAGNOSIS — J439 Emphysema, unspecified: Secondary | ICD-10-CM | POA: Diagnosis not present

## 2024-03-15 DIAGNOSIS — I10 Essential (primary) hypertension: Secondary | ICD-10-CM | POA: Diagnosis not present

## 2024-03-15 DIAGNOSIS — K219 Gastro-esophageal reflux disease without esophagitis: Secondary | ICD-10-CM | POA: Diagnosis not present

## 2024-03-15 DIAGNOSIS — M179 Osteoarthritis of knee, unspecified: Secondary | ICD-10-CM | POA: Diagnosis not present

## 2024-03-15 DIAGNOSIS — I051 Rheumatic mitral insufficiency: Secondary | ICD-10-CM | POA: Diagnosis not present

## 2024-03-15 DIAGNOSIS — Z961 Presence of intraocular lens: Secondary | ICD-10-CM | POA: Diagnosis not present

## 2024-03-23 DIAGNOSIS — Z961 Presence of intraocular lens: Secondary | ICD-10-CM | POA: Diagnosis not present

## 2024-03-23 DIAGNOSIS — M545 Low back pain, unspecified: Secondary | ICD-10-CM | POA: Diagnosis not present

## 2024-03-23 DIAGNOSIS — Z604 Social exclusion and rejection: Secondary | ICD-10-CM | POA: Diagnosis not present

## 2024-03-23 DIAGNOSIS — K5900 Constipation, unspecified: Secondary | ICD-10-CM | POA: Diagnosis not present

## 2024-03-23 DIAGNOSIS — F5104 Psychophysiologic insomnia: Secondary | ICD-10-CM | POA: Diagnosis not present

## 2024-03-23 DIAGNOSIS — I739 Peripheral vascular disease, unspecified: Secondary | ICD-10-CM | POA: Diagnosis not present

## 2024-03-23 DIAGNOSIS — H401190 Primary open-angle glaucoma, unspecified eye, stage unspecified: Secondary | ICD-10-CM | POA: Diagnosis not present

## 2024-03-23 DIAGNOSIS — H9113 Presbycusis, bilateral: Secondary | ICD-10-CM | POA: Diagnosis not present

## 2024-03-23 DIAGNOSIS — F419 Anxiety disorder, unspecified: Secondary | ICD-10-CM | POA: Diagnosis not present

## 2024-03-23 DIAGNOSIS — M179 Osteoarthritis of knee, unspecified: Secondary | ICD-10-CM | POA: Diagnosis not present

## 2024-03-23 DIAGNOSIS — J439 Emphysema, unspecified: Secondary | ICD-10-CM | POA: Diagnosis not present

## 2024-03-23 DIAGNOSIS — J4550 Severe persistent asthma, uncomplicated: Secondary | ICD-10-CM | POA: Diagnosis not present

## 2024-03-23 DIAGNOSIS — I700 Atherosclerosis of aorta: Secondary | ICD-10-CM | POA: Diagnosis not present

## 2024-03-23 DIAGNOSIS — H9312 Tinnitus, left ear: Secondary | ICD-10-CM | POA: Diagnosis not present

## 2024-03-23 DIAGNOSIS — I10 Essential (primary) hypertension: Secondary | ICD-10-CM | POA: Diagnosis not present

## 2024-03-23 DIAGNOSIS — L299 Pruritus, unspecified: Secondary | ICD-10-CM | POA: Diagnosis not present

## 2024-03-23 DIAGNOSIS — K219 Gastro-esophageal reflux disease without esophagitis: Secondary | ICD-10-CM | POA: Diagnosis not present

## 2024-03-23 DIAGNOSIS — I671 Cerebral aneurysm, nonruptured: Secondary | ICD-10-CM | POA: Diagnosis not present

## 2024-03-23 DIAGNOSIS — J984 Other disorders of lung: Secondary | ICD-10-CM | POA: Diagnosis not present

## 2024-03-23 DIAGNOSIS — I051 Rheumatic mitral insufficiency: Secondary | ICD-10-CM | POA: Diagnosis not present

## 2024-03-23 DIAGNOSIS — M26629 Arthralgia of temporomandibular joint, unspecified side: Secondary | ICD-10-CM | POA: Diagnosis not present

## 2024-03-23 DIAGNOSIS — H35371 Puckering of macula, right eye: Secondary | ICD-10-CM | POA: Diagnosis not present

## 2024-03-23 DIAGNOSIS — M810 Age-related osteoporosis without current pathological fracture: Secondary | ICD-10-CM | POA: Diagnosis not present

## 2024-03-23 DIAGNOSIS — Z96659 Presence of unspecified artificial knee joint: Secondary | ICD-10-CM | POA: Diagnosis not present

## 2024-03-23 DIAGNOSIS — E785 Hyperlipidemia, unspecified: Secondary | ICD-10-CM | POA: Diagnosis not present

## 2024-03-25 ENCOUNTER — Ambulatory Visit: Payer: Self-pay

## 2024-03-25 ENCOUNTER — Ambulatory Visit
Admission: RE | Admit: 2024-03-25 | Discharge: 2024-03-25 | Disposition: A | Discharge status: Home / Self Care | Source: Ambulatory Visit

## 2024-03-25 VITALS — BP 129/68 | HR 60 | Temp 97.9°F | Resp 17

## 2024-03-25 DIAGNOSIS — Z8679 Personal history of other diseases of the circulatory system: Secondary | ICD-10-CM

## 2024-03-25 DIAGNOSIS — Z8669 Personal history of other diseases of the nervous system and sense organs: Secondary | ICD-10-CM

## 2024-03-25 DIAGNOSIS — R42 Dizziness and giddiness: Secondary | ICD-10-CM | POA: Diagnosis not present

## 2024-03-25 DIAGNOSIS — H538 Other visual disturbances: Secondary | ICD-10-CM | POA: Diagnosis not present

## 2024-03-25 NOTE — ED Provider Notes (Signed)
 [facility]  Note:  This document was prepared using Dragon voice recognition software and may include unintentional dictation errors.  MRN: [id number] DOB: 11-08-1932  Subjective:   Cassandra Sims is a 89 y.o. female presenting for headache, dizziness, increased blurred vision x 3 days.  Patient normally has dizziness, occasional headaches, vision issues due to known brain aneurysm and history of glaucoma but patient states that vision has been more blurred and headaches more persistent x 3 to 4 days.  Patient has no chest pain, shortness of breath, weakness, nausea/vomiting, abdominal pain.  Patient was advised by neurologist's office to come in for evaluation for possible dehydration versus ear infection as potential causes of increased symptoms.  Patient was told not to go to the emergency department for advanced imaging by her neurologist because CT scans are unnecessary radiation due to known aneurysm with no safe intervention.  No current facility-administered medications for this encounter.  Current Outpatient Medications:    acetaminophen  (TYLENOL ) 500 MG tablet, Take 2 tablets (1,000 mg total) by mouth every 8 (eight) hours as needed for mild pain., Disp: 30 tablet, Rfl: 0   albuterol  (VENTOLIN  HFA) 108 (90 Base) MCG/ACT inhaler, INHALE 2 PUFFS INTO THE LUNGS EVERY 4 (FOUR) HOURS AS NEEDED FOR WHEEZING. REPORTED ON 10/10/2015, Disp: 6.7 each, Rfl: 1   amLODipine  (NORVASC ) 2.5 MG tablet, Take 2.5 mg by mouth 2 (two) times daily. (Patient taking differently: Take 2.5 mg by mouth daily.), Disp: , Rfl:    amLODipine  (NORVASC ) 5 MG tablet, Take 5 mg by mouth daily., Disp: , Rfl:    apraclonidine (IOPIDINE) 0.5 % ophthalmic solution, 1 drop 2 (two) times daily., Disp: , Rfl:    Azelastine  HCl (ASTEPRO ) 0.15 % SOLN, Place 1 spray into the nose at bedtime., Disp: , Rfl:    benralizumab  (FASENRA ) 30 MG/ML prefilled syringe, 30 mg by sub-q route. (Patient taking differently:  Inject every 2 months), Disp: , Rfl:    benzonatate  (TESSALON  PERLES) 100 MG capsule, Take 1 capsule (100 mg total) by mouth 3 (three) times daily as needed for cough., Disp: 42 capsule, Rfl: 0   busPIRone (BUSPAR) 5 MG tablet, Take 5 mg by mouth 3 (three) times daily as needed., Disp: , Rfl:    Calcium  Carbonate Antacid (TUMS PO), Take 1,000 mg by mouth daily., Disp: , Rfl:    carboxymethylcellulose (REFRESH PLUS) 0.5 % SOLN, Place 1 drop into both eyes 5 (five) times daily as needed (for dry eyes)., Disp: , Rfl:    cetirizine (ZYRTEC) 5 MG tablet, 1/2 tablet by mouth daily, Disp: , Rfl:    Cholecalciferol  25 MCG (1000 UT) tablet, Take 1,000 Units by mouth daily., Disp: , Rfl:    denosumab (PROLIA) 60 MG/ML SOSY injection, Inject into the skin., Disp: , Rfl:    diclofenac Sodium (VOLTAREN) 1 % GEL, APPLY 2 GRAM TO AFFECTED AREA EVERY EIGHT TO TWELVE HOURS AS NEEDED FOR PAIN, Disp: , Rfl:    dorzolamide  (TRUSOPT ) 2 % ophthalmic solution, Place 1 drop into both eyes 3 (three) times daily., Disp: , Rfl:    famotidine  (PEPCID ) 20 MG tablet, Take 1 tablet (20 mg total) by mouth daily., Disp: 90 tablet, Rfl: 1   labetalol  (NORMODYNE ) 100 MG tablet, Take 1 tablet by mouth 2 (two) times daily., Disp: , Rfl:    LUTEIN PO, Take 20 mg by mouth daily., Disp: , Rfl:    meclizine (ANTIVERT) 12.5 MG tablet, Take by mouth. (Patient taking differently: Take 12.5  mg by mouth 3 (three) times daily as needed for dizziness.), Disp: , Rfl:    mometasone -formoterol  (DULERA ) 100-5 MCG/ACT AERO, Inhale 2 puffs into the lungs daily. (Patient taking differently: Inhale 1 puff into the lungs daily.), Disp: 1 each, Rfl: 5   Multiple Vitamin (MULTIVITAMIN WITH MINERALS) TABS tablet, Take 1 tablet by mouth daily. Nature Made Multi Vitamin for Her 50+ W/No Iron, Disp: , Rfl:    olmesartan (BENICAR) 40 MG tablet, Take 40 mg by mouth every evening., Disp: , Rfl:    OVER THE COUNTER MEDICATION, 4 drops daily. Serum drops, Disp: ,  Rfl:    psyllium (METAMUCIL) 58.6 % powder, Take 1 packet by mouth daily. 14.3 g (1 heaping tablespoon) every morning., Disp: , Rfl:    ROCKLATAN 0.02-0.005 % SOLN, Place 1 drop into both eyes at bedtime., Disp: , Rfl:    Turmeric 450 MG CAPS, Take 450 mg by mouth daily with lunch. , Disp: , Rfl:    Allergies  Allergen Reactions   Penicillins Shortness Of Breath and Swelling    Has patient had a PCN reaction causing immediate rash, facial/tongue/throat swelling, SOB or lightheadedness with hypotension: Yes Has patient had a PCN reaction causing severe rash involving mucus membranes or skin necrosis: No Has patient had a PCN reaction that required hospitalization: No Has patient had a PCN reaction occurring within the last 10 years: No If all of the above answers are NO, then may proceed with Cephalosporin use.    Zantac  [Ranitidine ] Itching   Brinzolamide Other (See Comments)   Doxycycline  Other (See Comments)    Pt states she tolerates OKAY   Lactose Other (See Comments)   Adhesive [Tape] Itching    BAND-AID TAPE-skin redness/itching   Clindamycin/Lincomycin Other (See Comments)    Severe joint pain.   Codeine Nausea And Vomiting   Darvocet [Propoxyphene N-Acetaminophen ] Other (See Comments)    HTN   Dilaudid [Hydromorphone Hcl] Nausea And Vomiting    DROP BLOOD PRESSURE HYPOTENSIVE   Flagyl [Metronidazole] Other (See Comments)    Joint pains   Lactose Intolerance (Gi)    Levofloxacin      Joint pain   Macrodantin [Nitrofurantoin Macrocrystal]    Morphine And Codeine     hallucinate   Other    Pedi-Pre Tape Spray [Wound Dressing Adhesive]    Premarin [Conjugated Estrogens] Itching   Tramadol  Hcl Other (See Comments)    Causes insomnia   Vancomycin  Hives and Itching   Zofran  [Ondansetron  Hcl]     HTN   Clarithromycin     Other reaction(s): Abdominal Pain Anxious/nervous feeling   Sulfa Antibiotics Rash    Past Medical History:  Diagnosis Date   Anxiety     Arthritis    Asthma    Complication of anesthesia    hard to wake up, 2009  sit up in bed to fast and BP dropped low code blue called per pt.   Family history of adverse reaction to anesthesia    sister has naseau   GERD (gastroesophageal reflux disease)    Glaucoma    Headache    hx of migrains   HOH (hard of hearing)    Hypertension    Hypoglycemia    Multiple allergies    Peripheral vascular disease (HCC)    varicose veins , Laser vein surgery   PONV (postoperative nausea and vomiting)    Pre-diabetes    Prolonged QT interval 01/13/2018   Rhinitis    S/P knee replacement 01/13/2018  Wears glasses      Past Surgical History:  Procedure Laterality Date   ABDOMINAL HYSTERECTOMY     APPENDECTOMY     BREAST SURGERY     biopsy left    COLECTOMY  2008   obstruction   DIAGNOSTIC LAPAROSCOPY     exp   DILATION AND CURETTAGE OF UTERUS     EYE SURGERY     cataract   bil and retina surgery right eye, glaucoma surgery, right eye   FUNCTIONAL ENDOSCOPIC SINUS SURGERY  2009   bilat ethm,frontal,max   KNEE ARTHROSCOPY Right 03/30/2018   Procedure: right knee scope; synovectomy;  Surgeon: Melodi Lerner, MD;  Location: [facility];  Service: Orthopedics;  Laterality: Right;    NASAL SINUS SURGERY Bilateral 10/26/2012   Procedure: BILATERAL ENDOSCOPIC REVISION ETHMOID MAXILLARY AND FRONTAL SINUS SURGERY  ;  Surgeon: Ida Loader, MD;  Location: [facility];  Service: ENT;  Laterality: Bilateral;   OVARIAN CYST SURGERY     TOTAL KNEE ARTHROPLASTY Right 01/05/2018   Procedure: RIGHT TOTAL KNEE ARTHROPLASTY;  Surgeon: Melodi Lerner, MD;  Location: [facility];  Service: Orthopedics;  Laterality: Right;   TUBAL LIGATION     URETER SURGERY     cut accidentally during exp lap-ovarien cyst   VEIN LIGATION      Family History  Problem Relation Age of Onset   Glaucoma Mother    Arthritis Mother    Lung disease Sister    Breast cancer Daughter    Diabetes Mellitus I Other     Leukemia Brother    Allergic rhinitis Neg Hx    Angioedema Neg Hx    Asthma Neg Hx    Atopy Neg Hx    Eczema Neg Hx    Urticaria Neg Hx    Immunodeficiency Neg Hx     Social History   Tobacco Use   Smoking status: Never   Smokeless tobacco: Never  Vaping Use   Vaping status: Never Used  Substance Use Topics   Alcohol  use: No   Drug use: No    ROS Refer to HPI for ROS details.  Objective:    Vitals: BP 129/68 (BP Location: Right Arm)   Pulse 60   Temp 97.9 F (36.6 C) (Oral)   Resp 17   SpO2 94%   Physical Exam Vitals and nursing note reviewed.  Constitutional:      General: She is not in acute distress.    Appearance: Normal appearance. She is well-developed. She is not ill-appearing, toxic-appearing or diaphoretic.  HENT:     Head: Normocephalic and atraumatic.     Right Ear: Tympanic membrane, ear canal and external ear normal.     Left Ear: Tympanic membrane, ear canal and external ear normal.     Nose: Nose normal.     Mouth/Throat:     Mouth: Mucous membranes are moist.  Eyes:     General:        Right eye: No discharge.        Left eye: No discharge.     Extraocular Movements: Extraocular movements intact.     Conjunctiva/sclera: Conjunctivae normal.     Pupils: Pupils are equal, round, and reactive to light.  Cardiovascular:     Rate and Rhythm: Normal rate and regular rhythm.  Pulmonary:     Effort: Pulmonary effort is normal. No respiratory distress.     Breath sounds: No stridor. No wheezing.  Chest:     Chest wall:  No tenderness.  Skin:    General: Skin is warm and dry.  Neurological:     General: No focal deficit present.     Mental Status: She is alert and oriented to person, place, and time.  Psychiatric:        Mood and Affect: Mood normal.        Behavior: Behavior normal.     Procedures  No results found for this or any previous visit (from the past 24 hours).  Assessment and Plan :     Discharge Instructions        1. Dizziness (Primary) 2. History of intracranial aneurysm 3. Blurred vision 4. History of glaucoma - Continue using previously prescribed meclizine 12.5 mg up to 3 times daily as needed for dizziness symptoms. -EKG completed in UC shows normal sinus rhythm with nonspecific T wave abnormality, no STEMI, ventricular rate of 61 bpm, otherwise normal EKG. - Continue to follow recommendations from neurology and glaucoma specialist for further evaluation and management. - Based on neurologist recommendation if no further CT scans due to known history of aneurysm and no safe intervention, follow-up in emergency department is not necessary at this time. - If there is any change to symptoms, such as high fever, severe lethargy, severe altered mental status, follow-up in the emergency department for further evaluation and management.      Myrna Vonseggern B Sherri Levenhagen   Miabella Shannahan, [place], [place] 03/25/24 1310

## 2024-03-25 NOTE — Telephone Encounter (Signed)
 Appt scheduled for UC today.

## 2024-03-25 NOTE — Discharge Instructions (Addendum)
  1. Dizziness (Primary) 2. History of intracranial aneurysm 3. Blurred vision 4. History of glaucoma - Continue using previously prescribed meclizine 12.5 mg up to 3 times daily as needed for dizziness symptoms. -EKG completed in UC shows normal sinus rhythm with nonspecific T wave abnormality, no STEMI, ventricular rate of 61 bpm, otherwise normal EKG. - Continue to follow recommendations from neurology and glaucoma specialist for further evaluation and management. - Based on neurologist recommendation if no further CT scans due to known history of aneurysm and no safe intervention, follow-up in emergency department is not necessary at this time. - If there is any change to symptoms, such as high fever, severe lethargy, severe altered mental status, follow-up in the emergency department for further evaluation and management.

## 2024-03-25 NOTE — ED Triage Notes (Signed)
 Pt c/o dizziness, headache, and vision changes that has worsened over last few days. Pt normally has dizziness, headache and vision issues but last few days has been different.   Pt has brain aneurysm.  Denies chest pain.

## 2024-03-25 NOTE — Telephone Encounter (Signed)
 FYI Only or Action Required?: FYI only for provider.  Patient was last seen in primary care on 12/30/2023 by Wheeler Harlene CROME, NP.  Called Nurse Triage reporting Dizziness.  Symptoms began several days ago.  Interventions attempted: Rest, hydration, or home remedies.  Symptoms are: gradually worsening.  Triage Disposition: See Physician Within 24 Hours  Patient/caregiver understands and will follow disposition?: Yes      Copied from CRM [id number]. Topic: Clinical - Red Word Triage >> Mar 25, 2024  9:13 AM Chasity T wrote: [facility] that prompted transfer to Nurse Triage: Anette daughter of patient is calling because she is having unusual lightheadedness, dizziness, balance is off than normal Reason for Disposition  [1] MODERATE dizziness (e.g., interferes with normal activities) AND [2] has NOT been evaluated by doctor (or NP/PA) for this  (Exception: Dizziness caused by heat exposure, sudden standing, or poor fluid intake.)  Answer Assessment - Initial Assessment Questions 1. DESCRIPTION: Describe your dizziness.     Dizziness is different, off 2. LIGHTHEADED: Do you feel lightheaded? (e.g., somewhat faint, woozy, weak upon standing)     Weak upon standing 3. VERTIGO: Do you feel like either you or the room is spinning or tilting? (i.e., vertigo)     denies 4. SEVERITY: How bad is it?  Do you feel like you are going to faint? Can you stand and walk?     Worse than usual 5. ONSET:  When did the dizziness begin?     A few days 6. AGGRAVATING FACTORS: Does anything make it worse? (e.g., standing, change in head position)     unknown 7. HEART RATE: Can you tell me your heart rate? How many beats in 15 seconds?  (Note: Not all patients can do this.)       N/a 8. CAUSE: What do you think is causing the dizziness? (e.g., decreased fluids or food, diarrhea, emotional distress, heat exposure, new medicine, sudden standing, vomiting; unknown)     unknown 9.  RECURRENT SYMPTOM: Have you had dizziness before? If Yes, ask: When was the last time? What happened that time?     Daily, endorses hx of aneurysm 10. OTHER SYMPTOMS: Do you have any other symptoms? (e.g., fever, chest pain, vomiting, diarrhea, bleeding)       denies 11. PREGNANCY: Is there any chance you are pregnant? When was your last menstrual period?       N/a    Pt has PMH of aneurysm and glaucoma, and daughter states that she is feeling off and sx are worse than usual. Triager schedule with [facility].  Protocols used: Dizziness - Lightheadedness-A-AH

## 2024-03-30 NOTE — Progress Notes (Unsigned)
 Subjective:     Patient ID: Cassandra Sims, female    DOB: 04/05/1933, 89 y.o.   MRN: [id number]  No chief complaint on file.   HPI  Patient is in today for follow up. Ms. Bradway is divorced, lives by herself.  She has 4 children.  She presents to office with her 2 daughters, Olympia and Almarie.Two of her children live within 5 miles of her home. She feels they can respond to assist her.  Ms. Bergevin no longer driving. Her two daughters who ar present at OV provide transportation to Ms. Fosters  medical appointments.   Patient recently went to urgent care/ED for dizziness.  She has history of intracranial aneurysm that is inoperable and attributes to vision and dizziness issues.  She uses meclizine 3 times daily for dizziness symptoms.  Followed by: Opthalmology, Allergy , Cardiology, Podiatry, ENT, Endocrinology, Neurology   Hypertension- Followed by CARDIOLOGY - Dr. Lilian- [facility] Meds: amlodipine  5 mg AM, 2.5 mg Hs,  BID,  and labetalol  100 mg BID, Olmesartan 40 mg HS.  Reports compliant with medications She has a history of a basilar artery aneurysm as well.  BP Readings from Last 1 Encounters:  03/25/24 129/68     Intercranial Aneurysm- followed by Neurology She followed by [facility] neurology- brain aneurysm is expected to continue enlarging at the brainstem, and believes her dizziness is likely r/t compression from the aneurysm in that region.  Pt is very high surgical risk and does not recommend Sx, unfortunately no other tests or solutions outside of Sx tx.   CT Head 03/2023- large basilar tip aneurysm since 11/11/2022 measuring up to 2.3 cm which exerts mass effect on the optic chiasm   Severe- persistent asthma- Followed by Allergy  and Pulmonology Meds: ICS/LABA (Dulera ) + Fasenra  injections. -Dr. Alaine [facility]. & albuterol  Reports compliant with Fasenra  injections, patient says that she has not been consistently taking Dulera   Nonallergis rhinitis- followed by  Allergy - followed by [facility] Astepro  nasal spray every day, Zyrtec 2.5 mg every day Hx of two sinus surgeries- last was 10 years ago*per Allergy -avoiding steroid spray due to glaucoma.  Reports compliant with medications   Glaucoma- Followed by OPHTHALMOLOGY  Reports worsening vision, she is no longer driving.     Osteoporosis- followed by ENDOCRINOLOGY  She is managed by Dr. Tobie (endocrinology) for this. She is receiving Prolia injections every 6 months.  Reports compliant with medications   Anxiety Buspar 5 mg TID prn  This 3 times a day and has help with anxiety  Hx of hyperlipidemia and coronary artery calcifications.  not on a statin.  Per  cardiology note 2024 LDL goal is >130  Lab Results  Component Value Date   CHOL 200 12/30/2023   HDL 71.90 12/30/2023   LDLCALC 111 (H) 12/30/2023   TRIG 86.0 12/30/2023   CHOLHDL 3 12/30/2023    Primary Open Angle Glaucoma Managed by [facility] (ophthalmology)  Chronic Congestion Managed by allergist. Hx of multiple sinus Sx.  She is using Astepro  nasal spray 1 spray each nostril at night and Zyrtec 2.5 mg once a day. Also with chronic rhinitis, try to limit Afrin. Using Astepro /Zyrtec, avoiding steroid spray due to glaucoma.   Chronic Dizziness   Patient has history of chronic dizziness, no recent falls.  Patient denies vertigo, lightheadedness, or sensation of the room spinning. No recent falls, head trauma, or loss of consciousness. Denies associated nausea,  weakness, or numbness. No recent illness, fever,  or new medications reported.  ACP Established with Palliative Care. ACP Documentation completed.  C/O---  Patient denies fever, chills, SOB, CP, palpitations, dyspnea, edema, HA, N/V/D, abdominal pain, urinary symptoms, rash, weight changes,      History of Present Illness         Health Maintenance Due  Topic Date Due   DTaP/Tdap/Td (2 - Td or Tdap) 04/29/2021   Influenza Vaccine  02/20/2024    COVID-19 Vaccine (9 - Pfizer risk 2024-25 season) 03/22/2024    Past Medical History:  Diagnosis Date   Anxiety    Arthritis    Asthma    Complication of anesthesia    hard to wake up, 2009  sit up in bed to fast and BP dropped low code blue called per pt.   Family history of adverse reaction to anesthesia    sister has naseau   GERD (gastroesophageal reflux disease)    Glaucoma    Headache    hx of migrains   HOH (hard of hearing)    Hypertension    Hypoglycemia    Multiple allergies    Peripheral vascular disease (HCC)    varicose veins , Laser vein surgery   PONV (postoperative nausea and vomiting)    Pre-diabetes    Prolonged QT interval 01/13/2018   Rhinitis    S/P knee replacement 01/13/2018   Wears glasses     Past Surgical History:  Procedure Laterality Date   ABDOMINAL HYSTERECTOMY     APPENDECTOMY     BREAST SURGERY     biopsy left    COLECTOMY  2008   obstruction   DIAGNOSTIC LAPAROSCOPY     exp   DILATION AND CURETTAGE OF UTERUS     EYE SURGERY     cataract   bil and retina surgery right eye, glaucoma surgery, right eye   FUNCTIONAL ENDOSCOPIC SINUS SURGERY  2009   bilat ethm,frontal,max   KNEE ARTHROSCOPY Right 03/30/2018   Procedure: right knee scope; synovectomy;  Surgeon: Melodi Lerner, MD;  Location: [facility];  Service: Orthopedics;  Laterality: Right;    NASAL SINUS SURGERY Bilateral 10/26/2012   Procedure: BILATERAL ENDOSCOPIC REVISION ETHMOID MAXILLARY AND FRONTAL SINUS SURGERY  ;  Surgeon: Ida Loader, MD;  Location: [facility];  Service: ENT;  Laterality: Bilateral;   OVARIAN CYST SURGERY     TOTAL KNEE ARTHROPLASTY Right 01/05/2018   Procedure: RIGHT TOTAL KNEE ARTHROPLASTY;  Surgeon: Melodi Lerner, MD;  Location: [facility];  Service: Orthopedics;  Laterality: Right;   TUBAL LIGATION     URETER SURGERY     cut accidentally during exp lap-ovarien cyst   VEIN LIGATION      Family History  Problem Relation Age of Onset    Glaucoma Mother    Arthritis Mother    Lung disease Sister    Breast cancer Daughter    Diabetes Mellitus I Other    Leukemia Brother    Allergic rhinitis Neg Hx    Angioedema Neg Hx    Asthma Neg Hx    Atopy Neg Hx    Eczema Neg Hx    Urticaria Neg Hx    Immunodeficiency Neg Hx     Social History   Socioeconomic History   Marital status: Divorced    Spouse name: Not on file   Number of children: 4   Years of education: Not on file   Highest education level: 12th grade  Occupational History   Not on file  Tobacco Use   Smoking status: Never   Smokeless tobacco: Never  Vaping Use   Vaping status: Never Used  Substance and Sexual Activity   Alcohol  use: No   Drug use: No   Sexual activity: Not on file  Other Topics Concern   Not on file  Social History Narrative   Originally from [place]. Previously lived in [place] for 14 months. Has worked in Photographer with [employer]      Lives alone      [place]--    2 Sons daughters   Social Drivers of Corporate investment banker Strain: Low Risk  (03/29/2024)   Overall Financial Resource Strain (CARDIA)    Difficulty of Paying Living Expenses: Not very hard  Food Insecurity: No Food Insecurity (03/29/2024)   Hunger Vital Sign    Worried About Running Out of Food in the Last Year: Never true    Ran Out of Food in the Last Year: Never true  Transportation Needs: No Transportation Needs (03/29/2024)   PRAPARE - Administrator, Civil Service (Medical): No    Lack of Transportation (Non-Medical): No  Physical Activity: Inactive (03/29/2024)   Exercise Vital Sign    Days of Exercise per Week: 0 days    Minutes of Exercise per Session: Not on file  Stress: No Stress Concern Present (03/29/2024)   [employer] of Occupational Health - Occupational Stress Questionnaire    Feeling of Stress: Not at all  Social Connections: Moderately Integrated (03/29/2024)   Social Connection and Isolation Panel    Frequency of  Communication with Friends and Family: More than three times a week    Frequency of Social Gatherings with Friends and Family: More than three times a week    Attends Religious Services: More than 4 times per year    Active Member of [employer] or Organizations: Yes    Attends Engineer, structural: More than 4 times per year    Marital Status: Divorced  Intimate Partner Violence: Not At Risk (01/28/2024)   Humiliation, Afraid, Rape, and Kick questionnaire    Fear of Current or Ex-Partner: No    Emotionally Abused: No    Physically Abused: No    Sexually Abused: No    Outpatient Medications Prior to Visit  Medication Sig Dispense Refill   acetaminophen  (TYLENOL ) 500 MG tablet Take 2 tablets (1,000 mg total) by mouth every 8 (eight) hours as needed for mild pain. 30 tablet 0   albuterol  (VENTOLIN  HFA) 108 (90 Base) MCG/ACT inhaler INHALE 2 PUFFS INTO THE LUNGS EVERY 4 (FOUR) HOURS AS NEEDED FOR WHEEZING. REPORTED ON 10/10/2015 6.7 each 1   amLODipine  (NORVASC ) 2.5 MG tablet Take 2.5 mg by mouth 2 (two) times daily. (Patient taking differently: Take 2.5 mg by mouth daily.)     amLODipine  (NORVASC ) 5 MG tablet Take 5 mg by mouth daily.     apraclonidine (IOPIDINE) 0.5 % ophthalmic solution 1 drop 2 (two) times daily.     Azelastine  HCl (ASTEPRO ) 0.15 % SOLN Place 1 spray into the nose at bedtime.     benralizumab  (FASENRA ) 30 MG/ML prefilled syringe 30 mg by sub-q route. (Patient taking differently: Inject every 2 months)     benzonatate  (TESSALON  PERLES) 100 MG capsule Take 1 capsule (100 mg total) by mouth 3 (three) times daily as needed for cough. 42 capsule 0   busPIRone (BUSPAR) 5 MG tablet Take 5 mg by mouth 3 (three) times daily as needed.  Calcium  Carbonate Antacid (TUMS PO) Take 1,000 mg by mouth daily.     carboxymethylcellulose (REFRESH PLUS) 0.5 % SOLN Place 1 drop into both eyes 5 (five) times daily as needed (for dry eyes).     cetirizine (ZYRTEC) 5 MG tablet 1/2 tablet by  mouth daily     Cholecalciferol  25 MCG (1000 UT) tablet Take 1,000 Units by mouth daily.     denosumab (PROLIA) 60 MG/ML SOSY injection Inject into the skin.     diclofenac Sodium (VOLTAREN) 1 % GEL APPLY 2 GRAM TO AFFECTED AREA EVERY EIGHT TO TWELVE HOURS AS NEEDED FOR PAIN     dorzolamide  (TRUSOPT ) 2 % ophthalmic solution Place 1 drop into both eyes 3 (three) times daily.     famotidine  (PEPCID ) 20 MG tablet Take 1 tablet (20 mg total) by mouth daily. 90 tablet 1   labetalol  (NORMODYNE ) 100 MG tablet Take 1 tablet by mouth 2 (two) times daily.     LUTEIN PO Take 20 mg by mouth daily.     meclizine (ANTIVERT) 12.5 MG tablet Take by mouth. (Patient taking differently: Take 12.5 mg by mouth 3 (three) times daily as needed for dizziness.)     mometasone -formoterol  (DULERA ) 100-5 MCG/ACT AERO Inhale 2 puffs into the lungs daily. (Patient taking differently: Inhale 1 puff into the lungs daily.) 1 each 5   Multiple Vitamin (MULTIVITAMIN WITH MINERALS) TABS tablet Take 1 tablet by mouth daily. Nature Made Multi Vitamin for Her 50+ W/No Iron     olmesartan (BENICAR) 40 MG tablet Take 40 mg by mouth every evening.     OVER THE COUNTER MEDICATION 4 drops daily. Serum drops     psyllium (METAMUCIL) 58.6 % powder Take 1 packet by mouth daily. 14.3 g (1 heaping tablespoon) every morning.     ROCKLATAN 0.02-0.005 % SOLN Place 1 drop into both eyes at bedtime.     Turmeric 450 MG CAPS Take 450 mg by mouth daily with lunch.      No facility-administered medications prior to visit.    Allergies  Allergen Reactions   Penicillins Shortness Of Breath and Swelling    Has patient had a PCN reaction causing immediate rash, facial/tongue/throat swelling, SOB or lightheadedness with hypotension: Yes Has patient had a PCN reaction causing severe rash involving mucus membranes or skin necrosis: No Has patient had a PCN reaction that required hospitalization: No Has patient had a PCN reaction occurring within the  last 10 years: No If all of the above answers are NO, then may proceed with Cephalosporin use.    Zantac  [Ranitidine ] Itching   Brinzolamide Other (See Comments)   Doxycycline  Other (See Comments)    Pt states she tolerates OKAY   Lactose Other (See Comments)   Adhesive [Tape] Itching    BAND-AID TAPE-skin redness/itching   Clindamycin/Lincomycin Other (See Comments)    Severe joint pain.   Codeine Nausea And Vomiting   Darvocet [Propoxyphene N-Acetaminophen ] Other (See Comments)    HTN   Dilaudid [Hydromorphone Hcl] Nausea And Vomiting    DROP BLOOD PRESSURE HYPOTENSIVE   Flagyl [Metronidazole] Other (See Comments)    Joint pains   Lactose Intolerance (Gi)    Levofloxacin      Joint pain   Macrodantin [Nitrofurantoin Macrocrystal]    Morphine And Codeine     hallucinate   Other    Pedi-Pre Tape Spray [Wound Dressing Adhesive]    Premarin [Conjugated Estrogens] Itching   Tramadol  Hcl Other (See Comments)    Causes  insomnia   Vancomycin  Hives and Itching   Zofran  [Ondansetron  Hcl]     HTN   Clarithromycin     Other reaction(s): Abdominal Pain Anxious/nervous feeling   Sulfa Antibiotics Rash    ROS See HPI    Objective:     Physical Exam  General: No acute distress. Awake and conversant. +frail Neck: Neck is supple. No masses or thyromegaly.  Respiratory: Mild wheezing. Respirations are non-labored.  Skin: Warm. No rashes, ulcers, ecchymosis Psych: Alert and oriented. Cooperative, Appropriate mood and affect, Normal judgment.  CV: RRR. No lower extremity edema.  MSK: Uses 4 wheel walker for ambulation. No clubbing or cyanosis.+ TWP LL back.  Neuro: CN II-XII grossly normal.     There were no vitals taken for this visit. Wt Readings from Last 3 Encounters:  01/28/24 109 lb (49.4 kg)  12/30/23 109 lb 12.8 oz (49.8 kg)  10/28/23 111 lb 6.4 oz (50.5 kg)       Assessment & Plan:   Problem List Items Addressed This Visit     Dizziness - Primary    Chronic.  Followed by [facility].  Per neurology note- patient's intercranial aneurysm is contributing to dizziness and unfortunately patient is not recommended for Sx or further intervention due to high risk. Aneurysm contributes to dizziness and vision issues.  She has been taking meclizine prn.      Essential hypertension   Follows with Cardiology. Well controlled, no changes to meds. Encouraged heart healthy diet such as the DASH diet and exercise as tolerated.        Hyperlipidemia LDL goal <130   Encourage heart healthy diet such as MIND or DASH diet, increase exercise, avoid trans fats, simple carbohydrates and processed foods, consider a krill or fish or flaxseed oil cap daily.        Intracranial aneurysm    large basilar tip aneurysm since 11/11/2022 measuring up to 2.3 cm which exerts mass effect on the optic chiasm.  . Patient follows with Neurology. Unfortunately, no treatment options are currently available. Neurology does not recommend surgical intervention due to associated risks. Patient reports ongoing dizziness and intermittent vision loss. Neurology advises against further imaging at this time.      Osteoporosis   Follows with endocrinology. Getting Prolia injections q 6 months,      Sinusitis, chronic   Follows with ENT      Unsteady gait   Encourage use of assistive devices as needed for safety and fall prevention. Recommend home safety modifications and fall precautions.       Back pain Encouraged moist heat and gentle stretching as tolerated. May try [place] OTC, tylenol  OTC as directed and report if symptoms worsen or seek immediate care.    I am having Sophiarose B. Oki maintain her psyllium, multivitamin with minerals, Turmeric, carboxymethylcellulose, LUTEIN PO, labetalol , olmesartan, amLODipine , Calcium  Carbonate Antacid (TUMS PO), Cholecalciferol , Rocklatan, dorzolamide , Azelastine  HCl, acetaminophen , albuterol , benzonatate , famotidine ,  apraclonidine, mometasone -formoterol , benralizumab , busPIRone, cetirizine, denosumab, diclofenac Sodium, meclizine, OVER THE COUNTER MEDICATION, and amLODipine .  No orders of the defined types were placed in this encounter.

## 2024-03-31 DIAGNOSIS — M179 Osteoarthritis of knee, unspecified: Secondary | ICD-10-CM | POA: Diagnosis not present

## 2024-03-31 DIAGNOSIS — M810 Age-related osteoporosis without current pathological fracture: Secondary | ICD-10-CM | POA: Diagnosis not present

## 2024-03-31 DIAGNOSIS — H9312 Tinnitus, left ear: Secondary | ICD-10-CM | POA: Diagnosis not present

## 2024-03-31 DIAGNOSIS — H401190 Primary open-angle glaucoma, unspecified eye, stage unspecified: Secondary | ICD-10-CM | POA: Diagnosis not present

## 2024-03-31 DIAGNOSIS — J439 Emphysema, unspecified: Secondary | ICD-10-CM | POA: Diagnosis not present

## 2024-03-31 DIAGNOSIS — H9113 Presbycusis, bilateral: Secondary | ICD-10-CM | POA: Diagnosis not present

## 2024-03-31 DIAGNOSIS — H35371 Puckering of macula, right eye: Secondary | ICD-10-CM | POA: Diagnosis not present

## 2024-03-31 DIAGNOSIS — I739 Peripheral vascular disease, unspecified: Secondary | ICD-10-CM | POA: Diagnosis not present

## 2024-03-31 DIAGNOSIS — I10 Essential (primary) hypertension: Secondary | ICD-10-CM | POA: Diagnosis not present

## 2024-03-31 DIAGNOSIS — I051 Rheumatic mitral insufficiency: Secondary | ICD-10-CM | POA: Diagnosis not present

## 2024-03-31 DIAGNOSIS — L299 Pruritus, unspecified: Secondary | ICD-10-CM | POA: Diagnosis not present

## 2024-03-31 DIAGNOSIS — F5104 Psychophysiologic insomnia: Secondary | ICD-10-CM | POA: Diagnosis not present

## 2024-03-31 DIAGNOSIS — I700 Atherosclerosis of aorta: Secondary | ICD-10-CM | POA: Diagnosis not present

## 2024-03-31 DIAGNOSIS — M26629 Arthralgia of temporomandibular joint, unspecified side: Secondary | ICD-10-CM | POA: Diagnosis not present

## 2024-03-31 DIAGNOSIS — Z96659 Presence of unspecified artificial knee joint: Secondary | ICD-10-CM | POA: Diagnosis not present

## 2024-03-31 DIAGNOSIS — M545 Low back pain, unspecified: Secondary | ICD-10-CM | POA: Diagnosis not present

## 2024-03-31 DIAGNOSIS — Z961 Presence of intraocular lens: Secondary | ICD-10-CM | POA: Diagnosis not present

## 2024-03-31 DIAGNOSIS — F419 Anxiety disorder, unspecified: Secondary | ICD-10-CM | POA: Diagnosis not present

## 2024-03-31 DIAGNOSIS — Z604 Social exclusion and rejection: Secondary | ICD-10-CM | POA: Diagnosis not present

## 2024-03-31 DIAGNOSIS — J984 Other disorders of lung: Secondary | ICD-10-CM | POA: Diagnosis not present

## 2024-03-31 DIAGNOSIS — J4550 Severe persistent asthma, uncomplicated: Secondary | ICD-10-CM | POA: Diagnosis not present

## 2024-03-31 DIAGNOSIS — K219 Gastro-esophageal reflux disease without esophagitis: Secondary | ICD-10-CM | POA: Diagnosis not present

## 2024-03-31 DIAGNOSIS — I671 Cerebral aneurysm, nonruptured: Secondary | ICD-10-CM | POA: Diagnosis not present

## 2024-03-31 DIAGNOSIS — K5900 Constipation, unspecified: Secondary | ICD-10-CM | POA: Diagnosis not present

## 2024-03-31 DIAGNOSIS — E785 Hyperlipidemia, unspecified: Secondary | ICD-10-CM | POA: Diagnosis not present

## 2024-03-31 NOTE — Assessment & Plan Note (Signed)
 Chronic.  Followed by [facility] health neurology.  Per neurology note- patient's intercranial aneurysm is contributing to dizziness and unfortunately patient is not recommended for Sx or further intervention due to high risk. Aneurysm contributes to dizziness and vision issues.  She has been taking meclizine prn.

## 2024-03-31 NOTE — Assessment & Plan Note (Signed)
  large basilar tip aneurysm since 11/11/2022 measuring up to 2.3 cm which exerts mass effect on the optic chiasm.  . Patient follows with Neurology. Unfortunately, no treatment options are currently available. Neurology does not recommend surgical intervention due to associated risks. Patient reports ongoing dizziness and intermittent vision loss. Neurology advises against further imaging at this time.

## 2024-03-31 NOTE — Assessment & Plan Note (Signed)
 Encourage heart healthy diet such as MIND or DASH diet, increase exercise, avoid trans fats, simple carbohydrates and processed foods, consider a krill or fish or flaxseed oil cap daily.

## 2024-03-31 NOTE — Assessment & Plan Note (Signed)
 Follows with Cardiology. Well controlled, no changes to meds. Encouraged heart healthy diet such as the DASH diet and exercise as tolerated.

## 2024-03-31 NOTE — Assessment & Plan Note (Signed)
 Follows with endocrinology. Getting Prolia injections q 6 months,

## 2024-03-31 NOTE — Assessment & Plan Note (Signed)
 Encourage use of assistive devices as needed for safety and fall prevention. Recommend home safety modifications and fall precautions.

## 2024-03-31 NOTE — Assessment & Plan Note (Signed)
 Follows with ENT

## 2024-04-01 ENCOUNTER — Ambulatory Visit (INDEPENDENT_AMBULATORY_CARE_PROVIDER_SITE_OTHER): Admitting: Student

## 2024-04-01 ENCOUNTER — Encounter: Payer: Self-pay | Admitting: Student

## 2024-04-01 VITALS — BP 118/68 | HR 59 | Temp 97.7°F | Resp 12 | Ht 62.5 in | Wt 109.8 lb

## 2024-04-01 DIAGNOSIS — R42 Dizziness and giddiness: Secondary | ICD-10-CM

## 2024-04-01 DIAGNOSIS — J329 Chronic sinusitis, unspecified: Secondary | ICD-10-CM

## 2024-04-01 DIAGNOSIS — R2681 Unsteadiness on feet: Secondary | ICD-10-CM

## 2024-04-01 DIAGNOSIS — I10 Essential (primary) hypertension: Secondary | ICD-10-CM | POA: Diagnosis not present

## 2024-04-01 DIAGNOSIS — M810 Age-related osteoporosis without current pathological fracture: Secondary | ICD-10-CM

## 2024-04-01 DIAGNOSIS — Z23 Encounter for immunization: Secondary | ICD-10-CM | POA: Diagnosis not present

## 2024-04-01 DIAGNOSIS — E785 Hyperlipidemia, unspecified: Secondary | ICD-10-CM | POA: Diagnosis not present

## 2024-04-01 DIAGNOSIS — I671 Cerebral aneurysm, nonruptured: Secondary | ICD-10-CM | POA: Diagnosis not present

## 2024-04-01 NOTE — Patient Instructions (Signed)
 Get Tetanus and Covid at pharmacy

## 2024-04-01 NOTE — Addendum Note (Signed)
 Addended by: ESTELLE GILLIS D on: 04/01/2024 09:15 AM   Modules accepted: Orders

## 2024-04-07 ENCOUNTER — Encounter: Payer: Self-pay | Admitting: Student

## 2024-04-26 DIAGNOSIS — H401113 Primary open-angle glaucoma, right eye, severe stage: Secondary | ICD-10-CM | POA: Diagnosis not present

## 2024-04-26 DIAGNOSIS — H401123 Primary open-angle glaucoma, left eye, severe stage: Secondary | ICD-10-CM | POA: Diagnosis not present

## 2024-04-26 DIAGNOSIS — H538 Other visual disturbances: Secondary | ICD-10-CM | POA: Diagnosis not present

## 2024-05-12 DIAGNOSIS — H538 Other visual disturbances: Secondary | ICD-10-CM | POA: Diagnosis not present

## 2024-05-12 DIAGNOSIS — H401113 Primary open-angle glaucoma, right eye, severe stage: Secondary | ICD-10-CM | POA: Diagnosis not present

## 2024-05-12 DIAGNOSIS — H401123 Primary open-angle glaucoma, left eye, severe stage: Secondary | ICD-10-CM | POA: Diagnosis not present

## 2024-05-25 DIAGNOSIS — B351 Tinea unguium: Secondary | ICD-10-CM | POA: Diagnosis not present

## 2024-05-25 DIAGNOSIS — L600 Ingrowing nail: Secondary | ICD-10-CM | POA: Diagnosis not present

## 2024-06-02 DIAGNOSIS — M810 Age-related osteoporosis without current pathological fracture: Secondary | ICD-10-CM | POA: Diagnosis not present

## 2025-01-28 ENCOUNTER — Ambulatory Visit
